# Patient Record
Sex: Female | Born: 1955 | Race: White | Hispanic: No | Marital: Married | Smoking: Former smoker
Health system: Southern US, Community
[De-identification: ages and names within clinical notes are randomized; demographics above are authoritative.]

## PROBLEM LIST (undated history)

## (undated) DIAGNOSIS — Z923 Personal history of irradiation: Secondary | ICD-10-CM

## (undated) DIAGNOSIS — K219 Gastro-esophageal reflux disease without esophagitis: Secondary | ICD-10-CM

## (undated) DIAGNOSIS — E785 Hyperlipidemia, unspecified: Secondary | ICD-10-CM

## (undated) HISTORY — DX: Personal history of irradiation: Z92.3

---

## 1997-09-07 HISTORY — PX: ABDOMINAL HYSTERECTOMY: SHX81

## 1997-10-17 ENCOUNTER — Ambulatory Visit (HOSPITAL_COMMUNITY): Admission: RE | Admit: 1997-10-17 | Discharge: 1997-10-17 | Payer: Self-pay | Admitting: Obstetrics and Gynecology

## 1998-04-19 ENCOUNTER — Inpatient Hospital Stay (HOSPITAL_COMMUNITY): Admission: EM | Admit: 1998-04-19 | Discharge: 1998-04-22 | Payer: Self-pay | Admitting: Emergency Medicine

## 1999-10-28 ENCOUNTER — Other Ambulatory Visit: Admission: RE | Admit: 1999-10-28 | Discharge: 1999-10-28 | Payer: Self-pay | Admitting: Obstetrics and Gynecology

## 2001-02-02 ENCOUNTER — Other Ambulatory Visit: Admission: RE | Admit: 2001-02-02 | Discharge: 2001-02-02 | Payer: Self-pay | Admitting: Obstetrics and Gynecology

## 2002-05-03 ENCOUNTER — Other Ambulatory Visit: Admission: RE | Admit: 2002-05-03 | Discharge: 2002-05-03 | Payer: Self-pay | Admitting: Obstetrics and Gynecology

## 2003-09-11 ENCOUNTER — Other Ambulatory Visit: Admission: RE | Admit: 2003-09-11 | Discharge: 2003-09-11 | Payer: Self-pay | Admitting: Obstetrics and Gynecology

## 2004-10-28 ENCOUNTER — Other Ambulatory Visit: Admission: RE | Admit: 2004-10-28 | Discharge: 2004-10-28 | Payer: Self-pay | Admitting: Obstetrics and Gynecology

## 2005-12-25 ENCOUNTER — Other Ambulatory Visit: Admission: RE | Admit: 2005-12-25 | Discharge: 2005-12-25 | Payer: Self-pay | Admitting: Obstetrics and Gynecology

## 2007-03-09 ENCOUNTER — Other Ambulatory Visit: Admission: RE | Admit: 2007-03-09 | Discharge: 2007-03-09 | Payer: Self-pay | Admitting: Obstetrics and Gynecology

## 2008-06-05 ENCOUNTER — Ambulatory Visit: Payer: Self-pay | Admitting: Obstetrics and Gynecology

## 2008-06-05 ENCOUNTER — Encounter: Payer: Self-pay | Admitting: Obstetrics and Gynecology

## 2008-06-05 ENCOUNTER — Other Ambulatory Visit: Admission: RE | Admit: 2008-06-05 | Discharge: 2008-06-05 | Payer: Self-pay | Admitting: Obstetrics and Gynecology

## 2008-07-02 ENCOUNTER — Ambulatory Visit: Payer: Self-pay | Admitting: Obstetrics and Gynecology

## 2019-08-29 DIAGNOSIS — Z20828 Contact with and (suspected) exposure to other viral communicable diseases: Secondary | ICD-10-CM | POA: Diagnosis not present

## 2019-08-29 DIAGNOSIS — Z9189 Other specified personal risk factors, not elsewhere classified: Secondary | ICD-10-CM | POA: Diagnosis not present

## 2019-09-06 DIAGNOSIS — Z20828 Contact with and (suspected) exposure to other viral communicable diseases: Secondary | ICD-10-CM | POA: Diagnosis not present

## 2020-01-02 ENCOUNTER — Other Ambulatory Visit: Payer: Self-pay

## 2020-01-02 ENCOUNTER — Ambulatory Visit (HOSPITAL_COMMUNITY)
Admission: EM | Admit: 2020-01-02 | Discharge: 2020-01-02 | Disposition: A | Discharge status: Home / Self Care | Payer: BC Managed Care – PPO | Attending: Family Medicine | Admitting: Family Medicine

## 2020-01-02 DIAGNOSIS — S0096XA Insect bite (nonvenomous) of unspecified part of head, initial encounter: Secondary | ICD-10-CM

## 2020-01-02 DIAGNOSIS — R220 Localized swelling, mass and lump, head: Secondary | ICD-10-CM | POA: Diagnosis not present

## 2020-01-02 DIAGNOSIS — W57XXXA Bitten or stung by nonvenomous insect and other nonvenomous arthropods, initial encounter: Secondary | ICD-10-CM | POA: Diagnosis not present

## 2020-01-02 DIAGNOSIS — S0086XA Insect bite (nonvenomous) of other part of head, initial encounter: Secondary | ICD-10-CM

## 2020-01-02 NOTE — ED Provider Notes (Signed)
[place]    CSN: [id number] Arrival date & time: 01/02/20  [id number]      History   Chief Complaint Chief Complaint  Patient presents with  . Rash    HPI Regina Young is a 64 y.o. female.   HPI  Patient has a swollen area on the left side of her jaw It is itching and stinging but it is not painful she has no dental pain, gum swelling, or pain with chewing She did have an extraction on the right side of her face over a week ago took penicillin and did have some dental infection.  This feels nothing similar  she does spend a lot of time outdoors.  An insect bite is possible  She states that the swollen area is slightly pink, but there is no raised rash, blisters  No past medical history on file.  There are no problems to display for this patient.   OB History   No obstetric history on file.      Home Medications    Prior to Admission medications   Not on File    Family History No family history on file.  Social History Social History   Tobacco Use  . Smoking status: Not on file  Substance Use Topics  . Alcohol use: Not on file  . Drug use: Not on file     Allergies   Patient has no known allergies.   Review of Systems Review of Systems   Physical Exam Triage Vital Signs ED Triage Vitals [01/02/20 0949]  Enc Vitals Group     BP (!) 166/89     Pulse Rate 84     Resp 16     Temp 98.3 F (36.8 C)     Temp src      SpO2 97 %     Weight      Height      Head Circumference      Peak Flow      Pain Score 0     Pain Loc      Pain Edu?      Excl. in [place]?    No data found.  Updated Vital Signs BP (!) 166/89   Pulse 84   Temp 98.3 F (36.8 C)   Resp 16   SpO2 97%     Physical Exam Constitutional:      General: She is not in acute distress.    Appearance: Normal appearance. She is well-developed and normal weight.  HENT:     Head: Normocephalic and atraumatic.     Jaw: There is normal jaw occlusion.     Salivary Glands:  Right salivary gland is not diffusely enlarged or tender. Left salivary gland is not diffusely enlarged or tender.   Eyes:     Conjunctiva/sclera: Conjunctivae normal.     Pupils: Pupils are equal, round, and reactive to light.  Cardiovascular:     Rate and Rhythm: Normal rate.  Pulmonary:     Effort: Pulmonary effort is normal. No respiratory distress.  Abdominal:     General: There is no distension.     Palpations: Abdomen is soft.  Musculoskeletal:        General: Normal range of motion.     Cervical back: Normal range of motion.  Skin:    General: Skin is warm and dry.  Neurological:     Mental Status: She is alert.      UC Treatments / Results  Labs (all  labs ordered are listed, but only abnormal results are displayed) Labs Reviewed - No data to display  EKG   Radiology No results found.  Procedures Procedures (including critical care time)  Medications Ordered in UC Medications - No data to display  Initial Impression / Assessment and Plan / UC Course  I have reviewed the triage vital signs and the nursing notes.  Pertinent labs & imaging results that were available during my care of the patient were reviewed by me and considered in my medical decision making (see chart for details).     Discussed with patient that blood pressure is mildly elevated.  She states this is typical for her when she goes to the doctor. I think this is likely an insect bite.  No evidence of infection.  Discussed conservative treatment Final Clinical Impressions(s) / UC Diagnoses   Final diagnoses:  Insect bite of other part of head, initial encounter  Swelling of left side of face     Discharge Instructions     Continue ice and cortisone cream Take an allergy pill for the itching Return as needed   ED Prescriptions    None     PDMP not reviewed this encounter.   Raylene Everts, MD 01/02/20 1056

## 2020-01-02 NOTE — Discharge Instructions (Addendum)
Continue ice and cortisone cream Take an allergy pill for the itching Return as needed

## 2020-01-02 NOTE — ED Triage Notes (Signed)
Pt c/o rash to L jaw since yesterday. Denies pain.

## 2020-10-25 ENCOUNTER — Other Ambulatory Visit: Payer: Self-pay | Admitting: Geriatric Medicine

## 2020-10-25 DIAGNOSIS — R1084 Generalized abdominal pain: Secondary | ICD-10-CM | POA: Diagnosis not present

## 2020-10-25 DIAGNOSIS — Z13220 Encounter for screening for lipoid disorders: Secondary | ICD-10-CM | POA: Diagnosis not present

## 2020-11-06 ENCOUNTER — Other Ambulatory Visit: Payer: BC Managed Care – PPO

## 2020-11-06 DIAGNOSIS — R1013 Epigastric pain: Secondary | ICD-10-CM | POA: Diagnosis not present

## 2020-11-12 ENCOUNTER — Other Ambulatory Visit: Payer: Self-pay | Admitting: Geriatric Medicine

## 2020-11-12 ENCOUNTER — Ambulatory Visit
Admission: RE | Admit: 2020-11-12 | Discharge: 2020-11-12 | Disposition: A | Discharge status: Home / Self Care | Payer: BC Managed Care – PPO | Source: Ambulatory Visit | Attending: Geriatric Medicine | Admitting: Geriatric Medicine

## 2020-11-12 DIAGNOSIS — F17210 Nicotine dependence, cigarettes, uncomplicated: Secondary | ICD-10-CM

## 2020-11-12 DIAGNOSIS — J439 Emphysema, unspecified: Secondary | ICD-10-CM | POA: Diagnosis not present

## 2020-12-03 ENCOUNTER — Other Ambulatory Visit: Payer: Self-pay | Admitting: Physician Assistant

## 2020-12-03 DIAGNOSIS — Z800 Family history of malignant neoplasm of digestive organs: Secondary | ICD-10-CM | POA: Diagnosis not present

## 2020-12-03 DIAGNOSIS — R194 Change in bowel habit: Secondary | ICD-10-CM | POA: Diagnosis not present

## 2020-12-03 DIAGNOSIS — R634 Abnormal weight loss: Secondary | ICD-10-CM | POA: Diagnosis not present

## 2020-12-03 DIAGNOSIS — R140 Abdominal distension (gaseous): Secondary | ICD-10-CM | POA: Diagnosis not present

## 2020-12-05 DIAGNOSIS — R140 Abdominal distension (gaseous): Secondary | ICD-10-CM | POA: Diagnosis not present

## 2020-12-12 DIAGNOSIS — D127 Benign neoplasm of rectosigmoid junction: Secondary | ICD-10-CM | POA: Diagnosis not present

## 2020-12-12 DIAGNOSIS — Z1211 Encounter for screening for malignant neoplasm of colon: Secondary | ICD-10-CM | POA: Diagnosis not present

## 2020-12-12 DIAGNOSIS — R140 Abdominal distension (gaseous): Secondary | ICD-10-CM | POA: Diagnosis not present

## 2020-12-12 DIAGNOSIS — K2930 Chronic superficial gastritis without bleeding: Secondary | ICD-10-CM | POA: Diagnosis not present

## 2020-12-12 DIAGNOSIS — D124 Benign neoplasm of descending colon: Secondary | ICD-10-CM | POA: Diagnosis not present

## 2020-12-12 DIAGNOSIS — K2980 Duodenitis without bleeding: Secondary | ICD-10-CM | POA: Diagnosis not present

## 2020-12-12 DIAGNOSIS — R634 Abnormal weight loss: Secondary | ICD-10-CM | POA: Diagnosis not present

## 2020-12-12 DIAGNOSIS — D123 Benign neoplasm of transverse colon: Secondary | ICD-10-CM | POA: Diagnosis not present

## 2020-12-12 DIAGNOSIS — K219 Gastro-esophageal reflux disease without esophagitis: Secondary | ICD-10-CM | POA: Diagnosis not present

## 2020-12-12 DIAGNOSIS — K5730 Diverticulosis of large intestine without perforation or abscess without bleeding: Secondary | ICD-10-CM | POA: Diagnosis not present

## 2020-12-12 DIAGNOSIS — D122 Benign neoplasm of ascending colon: Secondary | ICD-10-CM | POA: Diagnosis not present

## 2020-12-12 DIAGNOSIS — R6881 Early satiety: Secondary | ICD-10-CM | POA: Diagnosis not present

## 2020-12-16 DIAGNOSIS — C189 Malignant neoplasm of colon, unspecified: Secondary | ICD-10-CM | POA: Diagnosis not present

## 2020-12-18 ENCOUNTER — Ambulatory Visit
Admission: RE | Admit: 2020-12-18 | Discharge: 2020-12-18 | Disposition: A | Discharge status: Home / Self Care | Payer: BC Managed Care – PPO | Source: Ambulatory Visit | Attending: Physician Assistant | Admitting: Physician Assistant

## 2020-12-18 ENCOUNTER — Other Ambulatory Visit: Payer: Self-pay

## 2020-12-18 DIAGNOSIS — D734 Cyst of spleen: Secondary | ICD-10-CM | POA: Diagnosis not present

## 2020-12-18 DIAGNOSIS — Z800 Family history of malignant neoplasm of digestive organs: Secondary | ICD-10-CM

## 2020-12-18 DIAGNOSIS — R634 Abnormal weight loss: Secondary | ICD-10-CM

## 2020-12-18 DIAGNOSIS — K7689 Other specified diseases of liver: Secondary | ICD-10-CM | POA: Diagnosis not present

## 2020-12-18 DIAGNOSIS — C189 Malignant neoplasm of colon, unspecified: Secondary | ICD-10-CM | POA: Diagnosis not present

## 2020-12-18 MED ORDER — IOPAMIDOL (ISOVUE-300) INJECTION 61%
100.0000 mL | Freq: Once | INTRAVENOUS | Status: AC | PRN
  Administered 2020-12-18: 100 mL via INTRAVENOUS

## 2020-12-23 DIAGNOSIS — K6289 Other specified diseases of anus and rectum: Secondary | ICD-10-CM | POA: Diagnosis not present

## 2020-12-24 ENCOUNTER — Ambulatory Visit: Payer: Self-pay | Admitting: General Surgery

## 2020-12-24 NOTE — H&P (Signed)
The patient is a 65 year old female who presents with a colonic mass. 65 year old female who presents to the office for evaluation of a rectosigmoid mass seen on colonoscopy. This was biopsied and tattooed. Biopsy showed tubulovillous adenoma only. Patient states that she has had difficulty with bloating and abdominal pain for the past few months. She has approximately 15 pound unintentional weight loss. She reports irregular bowel habits. She has switched eating smaller meals to help with this. She underwent a CT scan of the abdomen and pelvis. This showed no sign of a colonic tumor definitively. There was also no sign of metastatic disease. She states her CEA level was normal. Past surgical history significant for open hysterectomy in the 90s   Past Surgical History Mammie Lorenzo, LPN; 03/06/5283 1:32 PM) Colon Polyp Removal - Colonoscopy  Hysterectomy (not due to cancer) - Complete   Diagnostic Studies History Mammie Lorenzo, LPN; 4/40/1027 2:53 PM) Colonoscopy  within last year Mammogram  >3 years ago Pap Smear  >5 years ago  Allergies Mammie Lorenzo, LPN; 6/64/4034 7:42 PM) No Known Drug Allergies  [12/23/2020]: Allergies Reconciled   Medication History Mammie Lorenzo, LPN; 5/95/6387 5:64 PM) Rosuvastatin Calcium (5MG  Tablet, Oral) Active. Medications Reconciled  Social History Mammie Lorenzo, LPN; 3/32/9518 8:41 PM) Alcohol use  Occasional alcohol use. Caffeine use  Coffee, Tea. No drug use  Tobacco use  Current every day smoker.  Family History Mammie Lorenzo, LPN; 6/60/6301 6:01 PM) Cancer  Mother. Colon Polyps  Sister. Diabetes Mellitus  Sister. Heart Disease  Father. Hypertension  Sister. Melanoma  Father.  Pregnancy / Birth History Mammie Lorenzo, LPN; 0/93/2355 7:32 PM) Contraceptive History  Oral contraceptives. Gravida  0 Para  0  Other Problems Mammie Lorenzo, LPN; 10/09/5425 0:62 PM) Hypercholesterolemia  Oophorectomy      Review of Systems Claiborne Billings [facility] LPN; 3/76/2831 5:17 PM) General Present- Weight Loss. Not Present- Appetite Loss, Chills, Fatigue, Fever, Night Sweats and Weight Gain. Skin Not Present- Change in Wart/Mole, Dryness, Hives, Jaundice, New Lesions, Non-Healing Wounds, Rash and Ulcer. HEENT Present- Wears glasses/contact lenses. Not Present- Earache, Hearing Loss, Hoarseness, Nose Bleed, Oral Ulcers, Ringing in the Ears, Seasonal Allergies, Sinus Pain, Sore Throat, Visual Disturbances and Yellow Eyes. Respiratory Not Present- Bloody sputum, Chronic Cough, Difficulty Breathing, Snoring and Wheezing. Breast Not Present- Breast Mass, Breast Pain, Nipple Discharge and Skin Changes. Cardiovascular Not Present- Chest Pain, Difficulty Breathing Lying Down, Leg Cramps, Palpitations, Rapid Heart Rate, Shortness of Breath and Swelling of Extremities. Gastrointestinal Present- Bloating, Change in Bowel Habits and Excessive gas. Not Present- Abdominal Pain, Bloody Stool, Chronic diarrhea, Constipation, Difficulty Swallowing, Gets full quickly at meals, Hemorrhoids, Indigestion, Nausea, Rectal Pain and Vomiting. Female Genitourinary Not Present- Frequency, Nocturia, Painful Urination, Pelvic Pain and Urgency. Musculoskeletal Not Present- Back Pain, Joint Pain, Joint Stiffness, Muscle Pain, Muscle Weakness and Swelling of Extremities. Psychiatric Not Present- Anxiety, Bipolar, Change in Sleep Pattern, Depression, Fearful and Frequent crying. Endocrine Not Present- Cold Intolerance, Excessive Hunger, Hair Changes, Heat Intolerance, Hot flashes and New Diabetes. Hematology Not Present- Blood Thinners, Easy Bruising, Excessive bleeding, Gland problems, HIV and Persistent Infections.  Vitals Claiborne Billings [place] LPN; 02/21/736 1:06 PM) 12/23/2020 2:01 PM Weight: 96.6 lb Height: 62in Body Surface Area: 1.4 m Body Mass Index: 17.67 kg/m  Pulse: 98 (Regular)  BP: 118/72(Sitting, Left Arm,  Standard)       Physical Exam Leighton Ruff MD; 2/69/4854 2:32 PM) General Mental Status-Alert. General Appearance-Cooperative.  Abdomen Palpation/Percussion Palpation and Percussion of the abdomen reveal -  Soft.    Assessment & Plan Leighton Ruff MD; 1/36/4383 2:33 PM) RECTAL MASS (K62.89) Impression: 65 year old female who presents to the office for evaluation of a rectal mass seen on recent colonoscopy. Biopsies only showed tubulovillous adenoma. There is no definitive localization on CT scan, but there appears to be some thickening in the mid rectum.   I will proceed with a flexible sigmoidoscopy to assess the tumor location myself and get more biopsies.  If her biopsies returned positive for adenocarcinoma, she will need a CT scan of her chest to complete her metastatic workup and possibly an MRI depending on the location of the tumor.

## 2020-12-24 NOTE — H&P (View-Only) (Signed)
The patient is a 65 year old female who presents with a colonic mass. 65 year old female who presents to the office for evaluation of a rectosigmoid mass seen on colonoscopy. This was biopsied and tattooed. Biopsy showed tubulovillous adenoma only. Patient states that she has had difficulty with bloating and abdominal pain for the past few months. She has approximately 15 pound unintentional weight loss. She reports irregular bowel habits. She has switched eating smaller meals to help with this. She underwent a CT scan of the abdomen and pelvis. This showed no sign of a colonic tumor definitively. There was also no sign of metastatic disease. She states her CEA level was normal. Past surgical history significant for open hysterectomy in the 90s   Past Surgical History Mammie Lorenzo, LPN; 12/04/5186 4:16 PM) Colon Polyp Removal - Colonoscopy  Hysterectomy (not due to cancer) - Complete   Diagnostic Studies History Mammie Lorenzo, LPN; 02/11/3015 0:10 PM) Colonoscopy  within last year Mammogram  >3 years ago Pap Smear  >5 years ago  Allergies Mammie Lorenzo, LPN; 9/32/3557 3:22 PM) No Known Drug Allergies  [12/23/2020]: Allergies Reconciled   Medication History Mammie Lorenzo, LPN; 0/25/4270 6:23 PM) Rosuvastatin Calcium (5MG  Tablet, Oral) Active. Medications Reconciled  Social History Mammie Lorenzo, LPN; 7/62/8315 1:76 PM) Alcohol use  Occasional alcohol use. Caffeine use  Coffee, Tea. No drug use  Tobacco use  Current every day smoker.  Family History Mammie Lorenzo, LPN; 1/60/7371 0:62 PM) Cancer  Mother. Colon Polyps  Sister. Diabetes Mellitus  Sister. Heart Disease  Father. Hypertension  Sister. Melanoma  Father.  Pregnancy / Birth History Mammie Lorenzo, LPN; 6/94/8546 2:70 PM) Contraceptive History  Oral contraceptives. Gravida  0 Para  0  Other Problems Mammie Lorenzo, LPN; 3/50/0938 1:82 PM) Hypercholesterolemia  Oophorectomy      Review of Systems Claiborne Billings [facility] LPN; 9/93/7169 6:78 PM) General Present- Weight Loss. Not Present- Appetite Loss, Chills, Fatigue, Fever, Night Sweats and Weight Gain. Skin Not Present- Change in Wart/Mole, Dryness, Hives, Jaundice, New Lesions, Non-Healing Wounds, Rash and Ulcer. HEENT Present- Wears glasses/contact lenses. Not Present- Earache, Hearing Loss, Hoarseness, Nose Bleed, Oral Ulcers, Ringing in the Ears, Seasonal Allergies, Sinus Pain, Sore Throat, Visual Disturbances and Yellow Eyes. Respiratory Not Present- Bloody sputum, Chronic Cough, Difficulty Breathing, Snoring and Wheezing. Breast Not Present- Breast Mass, Breast Pain, Nipple Discharge and Skin Changes. Cardiovascular Not Present- Chest Pain, Difficulty Breathing Lying Down, Leg Cramps, Palpitations, Rapid Heart Rate, Shortness of Breath and Swelling of Extremities. Gastrointestinal Present- Bloating, Change in Bowel Habits and Excessive gas. Not Present- Abdominal Pain, Bloody Stool, Chronic diarrhea, Constipation, Difficulty Swallowing, Gets full quickly at meals, Hemorrhoids, Indigestion, Nausea, Rectal Pain and Vomiting. Female Genitourinary Not Present- Frequency, Nocturia, Painful Urination, Pelvic Pain and Urgency. Musculoskeletal Not Present- Back Pain, Joint Pain, Joint Stiffness, Muscle Pain, Muscle Weakness and Swelling of Extremities. Psychiatric Not Present- Anxiety, Bipolar, Change in Sleep Pattern, Depression, Fearful and Frequent crying. Endocrine Not Present- Cold Intolerance, Excessive Hunger, Hair Changes, Heat Intolerance, Hot flashes and New Diabetes. Hematology Not Present- Blood Thinners, Easy Bruising, Excessive bleeding, Gland problems, HIV and Persistent Infections.  Vitals Claiborne Billings [place] LPN; 9/38/1017 5:10 PM) 12/23/2020 2:01 PM Weight: 96.6 lb Height: 62in Body Surface Area: 1.4 m Body Mass Index: 17.67 kg/m  Pulse: 98 (Regular)  BP: 118/72(Sitting, Left Arm,  Standard)       Physical Exam Leighton Ruff MD; 2/58/5277 2:32 PM) General Mental Status-Alert. General Appearance-Cooperative.  Abdomen Palpation/Percussion Palpation and Percussion of the abdomen reveal -  Soft.    Assessment & Plan Leighton Ruff MD; 3/73/4287 2:33 PM) RECTAL MASS (K62.89) Impression: 65 year old female who presents to the office for evaluation of a rectal mass seen on recent colonoscopy. Biopsies only showed tubulovillous adenoma. There is no definitive localization on CT scan, but there appears to be some thickening in the mid rectum.   I will proceed with a flexible sigmoidoscopy to assess the tumor location myself and get more biopsies.  If her biopsies returned positive for adenocarcinoma, she will need a CT scan of her chest to complete her metastatic workup and possibly an MRI depending on the location of the tumor.

## 2020-12-26 ENCOUNTER — Other Ambulatory Visit: Payer: Self-pay

## 2020-12-26 ENCOUNTER — Encounter (HOSPITAL_COMMUNITY): Payer: Self-pay | Admitting: General Surgery

## 2020-12-30 ENCOUNTER — Other Ambulatory Visit (HOSPITAL_COMMUNITY)
Admission: RE | Admit: 2020-12-30 | Discharge: 2020-12-30 | Disposition: A | Discharge status: Home / Self Care | Payer: BC Managed Care – PPO | Source: Ambulatory Visit | Attending: General Surgery | Admitting: General Surgery

## 2020-12-30 DIAGNOSIS — E7800 Pure hypercholesterolemia, unspecified: Secondary | ICD-10-CM | POA: Diagnosis not present

## 2020-12-30 DIAGNOSIS — Z808 Family history of malignant neoplasm of other organs or systems: Secondary | ICD-10-CM | POA: Diagnosis not present

## 2020-12-30 DIAGNOSIS — F17200 Nicotine dependence, unspecified, uncomplicated: Secondary | ICD-10-CM | POA: Diagnosis not present

## 2020-12-30 DIAGNOSIS — D128 Benign neoplasm of rectum: Secondary | ICD-10-CM | POA: Diagnosis not present

## 2020-12-30 DIAGNOSIS — Z01812 Encounter for preprocedural laboratory examination: Secondary | ICD-10-CM | POA: Insufficient documentation

## 2020-12-30 DIAGNOSIS — Z01818 Encounter for other preprocedural examination: Secondary | ICD-10-CM | POA: Diagnosis not present

## 2020-12-30 DIAGNOSIS — Z809 Family history of malignant neoplasm, unspecified: Secondary | ICD-10-CM | POA: Diagnosis not present

## 2020-12-30 DIAGNOSIS — Z90710 Acquired absence of both cervix and uterus: Secondary | ICD-10-CM | POA: Diagnosis not present

## 2020-12-30 DIAGNOSIS — Z20822 Contact with and (suspected) exposure to covid-19: Secondary | ICD-10-CM | POA: Insufficient documentation

## 2020-12-30 DIAGNOSIS — Z8371 Family history of colonic polyps: Secondary | ICD-10-CM | POA: Diagnosis not present

## 2020-12-31 LAB — SARS CORONAVIRUS 2 (TAT 6-24 HRS): SARS Coronavirus 2: NEGATIVE

## 2021-01-01 ENCOUNTER — Other Ambulatory Visit: Payer: Self-pay

## 2021-01-01 ENCOUNTER — Encounter (HOSPITAL_COMMUNITY): Payer: Self-pay | Admitting: General Surgery

## 2021-01-01 ENCOUNTER — Encounter (HOSPITAL_COMMUNITY)
Admission: RE | Disposition: A | Discharge status: Home / Self Care | Payer: Self-pay | Source: Home / Self Care | Attending: General Surgery

## 2021-01-01 ENCOUNTER — Ambulatory Visit (HOSPITAL_COMMUNITY)
Admission: RE | Admit: 2021-01-01 | Discharge: 2021-01-01 | Disposition: A | Discharge status: Home / Self Care | Payer: BC Managed Care – PPO | Attending: General Surgery | Admitting: General Surgery

## 2021-01-01 DIAGNOSIS — Z90710 Acquired absence of both cervix and uterus: Secondary | ICD-10-CM | POA: Insufficient documentation

## 2021-01-01 DIAGNOSIS — Z20822 Contact with and (suspected) exposure to covid-19: Secondary | ICD-10-CM | POA: Diagnosis not present

## 2021-01-01 DIAGNOSIS — Z808 Family history of malignant neoplasm of other organs or systems: Secondary | ICD-10-CM | POA: Diagnosis not present

## 2021-01-01 DIAGNOSIS — Z8371 Family history of colonic polyps: Secondary | ICD-10-CM | POA: Insufficient documentation

## 2021-01-01 DIAGNOSIS — Z01818 Encounter for other preprocedural examination: Secondary | ICD-10-CM | POA: Insufficient documentation

## 2021-01-01 DIAGNOSIS — E7800 Pure hypercholesterolemia, unspecified: Secondary | ICD-10-CM | POA: Diagnosis not present

## 2021-01-01 DIAGNOSIS — F17200 Nicotine dependence, unspecified, uncomplicated: Secondary | ICD-10-CM | POA: Diagnosis not present

## 2021-01-01 DIAGNOSIS — D375 Neoplasm of uncertain behavior of rectum: Secondary | ICD-10-CM | POA: Diagnosis not present

## 2021-01-01 DIAGNOSIS — D128 Benign neoplasm of rectum: Secondary | ICD-10-CM | POA: Diagnosis not present

## 2021-01-01 DIAGNOSIS — Z809 Family history of malignant neoplasm, unspecified: Secondary | ICD-10-CM | POA: Diagnosis not present

## 2021-01-01 HISTORY — DX: Hyperlipidemia, unspecified: E78.5

## 2021-01-01 HISTORY — PX: FLEXIBLE SIGMOIDOSCOPY: SHX5431

## 2021-01-01 HISTORY — PX: BIOPSY: SHX5522

## 2021-01-01 SURGERY — SIGMOIDOSCOPY, FLEXIBLE

## 2021-01-01 MED ORDER — LACTATED RINGERS IV SOLN: INTRAVENOUS | Status: DC

## 2021-01-01 MED ORDER — FENTANYL CITRATE (PF) 100 MCG/2ML IJ SOLN
INTRAMUSCULAR | Status: AC
  Filled 2021-01-01: qty 4

## 2021-01-01 MED ORDER — SODIUM CHLORIDE 0.9% FLUSH: 3.0000 mL | Freq: Two times a day (BID) | INTRAVENOUS | Status: DC

## 2021-01-01 MED ORDER — FENTANYL CITRATE (PF) 100 MCG/2ML IJ SOLN
INTRAMUSCULAR | Status: DC | PRN
  Administered 2021-01-01: 25 ug via INTRAVENOUS

## 2021-01-01 MED ORDER — MIDAZOLAM HCL (PF) 5 MG/ML IJ SOLN
INTRAMUSCULAR | Status: AC
  Filled 2021-01-01: qty 1

## 2021-01-01 MED ORDER — MIDAZOLAM HCL (PF) 5 MG/ML IJ SOLN
INTRAMUSCULAR | Status: DC | PRN
  Administered 2021-01-01: 2 mg via INTRAVENOUS

## 2021-01-01 NOTE — Discharge Instructions (Signed)
Post Colonoscopy Instructions ° °1. DIET: Follow a light bland diet the first 24 hours after arrival home, such as soup, liquids, crackers, etc.  Be sure to include lots of fluids daily.  Avoid fast food or heavy meals as your are more likely to get nauseated.   °2. You may have some mild rectal bleeding for the first few days after the procedure.  This should get less and less with time.  Resume any blood thinners 2 days after your procedure unless directed otherwise by your physician. °3. Take your usually prescribed home medications unless otherwise directed. °a. If you have any pain, it is helpful to get up and walk around, as it is usually from excess gas. °b. If this is not helpful, you can take an over-the-counter pain medication.  Choose one of the following that works best for you: °i. Naproxen (Aleve, etc)  Two 220mg tabs twice a day °ii. Ibuprofen (Advil, etc) Three 200mg tabs four times a day (every meal & bedtime) °iii. If you still have pain after using one of these, please call the office °4. It is normal to not have a bowel movement for 2-3 days after colonoscopy.   ° °5. ACTIVITIES as tolerated:   °6. You may resume regular (light) daily activities beginning the next day--such as daily self-care, walking, climbing stairs--gradually increasing activities as tolerated.  ° ° °WHEN TO CALL US [phone]: °1. Fever over 101.5 F (38.5 C)  °2. Severe abdominal or chest pain  °3. Large amount of rectal bleeding, passing multiple blood clots  °4. Dizziness or shortness of breath °5. Increasing nausea or vomiting ° ° The clinic staff is available to answer your questions during regular business hours (8:30am-5pm).  Please don’t hesitate to call and ask to speak to one of our nurses for clinical concerns.  ° If you have a medical emergency, go to the nearest emergency room or call 911. ° A surgeon from [facility] is always on call at the hospitals ° ° °[facility] °[address], [address], [place], [place]  [address] ? °MAIN: [phone] ? TOLL FREE: [phone] ?  °FAX [phone] °[email or web address] ° ° °

## 2021-01-01 NOTE — Interval H&P Note (Signed)
History and Physical Interval Note:  01/01/2021 4:26 PM  Regina Young  has presented today for surgery, with the diagnosis of colon cancer.  The various methods of treatment have been discussed with the patient and family. After consideration of risks, benefits and other options for treatment, the patient has consented to  Procedure(s) with comments: [place] (N/A) - IV SEDATION BY SURGEON as a surgical intervention.  The patient's history has been reviewed, patient examined, no change in status, stable for surgery.  I have reviewed the patient's chart and labs.  Questions were answered to the patient's satisfaction.     Rosario Adie, MD  [facility]

## 2021-01-01 NOTE — Op Note (Signed)
[facility] Patient Name: Regina Young Procedure Date: 01/01/2021 MRN: [id number] Attending MD: Leighton Ruff , MD Date of Birth: March 10, 1956 CSN: [id number] Age: 65 Admit Type: Outpatient Procedure:                Flexible Sigmoidoscopy Indications:              Preoperative assessment Providers:                Leighton Ruff, MD, Kary Kos RN, RN, Ladona Ridgel, Technician, Dulcy Fanny , RN Referring MD:              Medicines:                Fentanyl 25 micrograms IV, Midazolam 2 mg IV Complications:            No immediate complications. Estimated blood loss:                            Minimal. Estimated Blood Loss:     Estimated blood loss was minimal. Procedure:                Pre-Anesthesia Assessment:                           - Prior to the procedure, a History and Physical                            was performed, and patient medications and                            allergies were reviewed. The patient's tolerance of                            previous anesthesia was also reviewed. The risks                            and benefits of the procedure and the sedation                            options and risks were discussed with the patient.                            All questions were answered, and informed consent                            was obtained. Prior Anticoagulants: The patient has                            taken no previous anticoagulant or antiplatelet                            agents. ASA Grade Assessment: II - A patient with  mild systemic disease. After reviewing the risks                            and benefits, the patient was deemed in                            satisfactory condition to undergo the procedure.                           After obtaining informed consent, the scope was                            passed under direct vision. The [id number]-[id number] ([id number])                             Olympus gastroscope was introduced through the anus                            and advanced to the the sigmoid colon. The flexible                            sigmoidoscopy was accomplished without difficulty.                            The patient tolerated the procedure well. The                            quality of the bowel preparation was excellent. Scope In: 4:50:41 PM Scope Out: 4:58:36 PM Total Procedure Duration: 0 hours 7 minutes 55 seconds  Findings:      The perianal and digital rectal examinations were normal.      A tattoo was seen in the proximal rectum.      A polypoid lesion was found in the recto-sigmoid colon. The lesion was       frond-like/villous. No bleeding was present. Biopsies were taken with a       cold forceps for histology. Impression:               - No specimens collected. Moderate Sedation:      Moderate (conscious) sedation was administered by the endoscopy nurse       and supervised by the endoscopist. The following parameters were       monitored: oxygen saturation, heart rate, blood pressure, and response       to care. Recommendation:           - Discharge patient to home (ambulatory).                           - Resume previous diet.                           - Return to my office in 2 weeks. Procedure Code(s):        --- Professional ---                           [phone], Sigmoidoscopy, flexible; with biopsy, single  or multiple Diagnosis Code(s):        --- Professional ---                           I62.703, Encounter for other preprocedural                            examination CPT copyright 2019 [employer]. All rights reserved. The codes documented in this report are preliminary and upon coder review may  be revised to meet current compliance requirements. Leighton Ruff, MD Leighton Ruff, MD 5/00/9381 5:12:58 PM This report has been signed electronically. Number of Addenda: 0

## 2021-01-02 ENCOUNTER — Encounter (HOSPITAL_COMMUNITY): Payer: Self-pay | Admitting: General Surgery

## 2021-01-03 LAB — SURGICAL PATHOLOGY

## 2021-01-14 ENCOUNTER — Ambulatory Visit: Payer: Self-pay | Admitting: General Surgery

## 2021-01-14 DIAGNOSIS — K6289 Other specified diseases of anus and rectum: Secondary | ICD-10-CM | POA: Diagnosis not present

## 2021-01-14 NOTE — H&P (Signed)
The patient is a 65 year old female who presents with a colonic mass. 65 year old female who presents to the office for evaluation of a rectosigmoid mass seen on colonoscopy. This was biopsied and tattooed. Biopsy showed tubulovillous adenoma only. Patient states that she has had difficulty with bloating and abdominal pain for the past few months. She has approximately 15 pound unintentional weight loss. She reports irregular bowel habits. She has switched eating smaller meals to help with this. She underwent a CT scan of the abdomen and pelvis. This showed no sign of a colonic tumor definitively. There was also no sign of metastatic disease. She states her CEA level was normal. Past surgical history significant for open hysterectomy in the 90s. I recently repeated her flexible sigmoidoscopy and noted a mass in the rectosigmoid area approximately 12 cm from anal verge. This does not appear to be endoscopically resectable. 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   Problem List/Past Medical Leighton Ruff, MD; 7/82/9562 10:09 AM) RECTAL MASS (K62.89)  Past Surgical History Leighton Ruff, MD; 10/07/8655 10:09 AM) Colon Polyp Removal - Colonoscopy Hysterectomy (not due to cancer) - Complete  Diagnostic Studies History Leighton Ruff, MD; 8/46/9629 10:09 AM) Colonoscopy within last year Mammogram >3 years ago Pap Smear >5 years ago  Allergies Janeann Forehand, CNA; 01/14/2021 9:46 AM) No Known Drug Allergies [12/23/2020]: Allergies Reconciled  Medication History  Bisacodyl EC (5MG  Tablet DR, Oral) Active. Rosuvastatin Calcium (5MG  Tablet, Oral) Active.  Social History Leighton Ruff, MD; 02/02/4131 10:09 AM) Alcohol use Occasional alcohol use. Caffeine use Coffee, Tea. No drug use Tobacco use Current every day smoker.  Family History Leighton Ruff, MD; 4/40/1027 10:09 AM) Cancer Mother. Colon Polyps Sister. Diabetes Mellitus  Sister. Heart Disease Father. Hypertension Sister. Melanoma Father.  Pregnancy / Birth History Leighton Ruff, MD; 2/53/6644 10:09 AM) Contraceptive History Oral contraceptives. Gravida 0 Para 0  Other Problems Leighton Ruff, MD; 0/34/7425 10:09 AM) Hypercholesterolemia Oophorectomy Bilateral.     Review of Systems General Present- Weight Loss. Not Present- Appetite Loss, Chills, Fatigue, Fever, Night Sweats and Weight Gain. Skin Not Present- Change in Wart/Mole, Dryness, Hives, Jaundice, New Lesions, Non-Healing Wounds, Rash and Ulcer. HEENT Present- Wears glasses/contact lenses. Not Present- Earache, Hearing Loss, Hoarseness, Nose Bleed, Oral Ulcers, Ringing in the Ears, Seasonal Allergies, Sinus Pain, Sore Throat, Visual Disturbances and Yellow Eyes. Respiratory Not Present- Bloody sputum, Chronic Cough, Difficulty Breathing, Snoring and Wheezing. Breast Not Present- Breast Mass, Breast Pain, Nipple Discharge and Skin Changes. Cardiovascular Not Present- Chest Pain, Difficulty Breathing Lying Down, Leg Cramps, Palpitations, Rapid Heart Rate, Shortness of Breath and Swelling of Extremities. Gastrointestinal Present- Bloating, Change in Bowel Habits and Excessive gas. Not Present- Abdominal Pain, Bloody Stool, Chronic diarrhea, Constipation, Difficulty Swallowing, Gets full quickly at meals, Hemorrhoids, Indigestion, Nausea, Rectal Pain and Vomiting. Female Genitourinary Not Present- Frequency, Nocturia, Painful Urination, Pelvic Pain and Urgency. Musculoskeletal Not Present- Back Pain, Joint Pain, Joint Stiffness, Muscle Pain, Muscle Weakness and Swelling of Extremities. Psychiatric Not Present- Anxiety, Bipolar, Change in Sleep Pattern, Depression, Fearful and Frequent crying. Endocrine Not Present- Cold Intolerance, Excessive Hunger, Hair Changes, Heat Intolerance, Hot flashes and New Diabetes. Hematology Not Present- Blood Thinners, Easy Bruising, Excessive bleeding, Gland  problems, HIV and Persistent Infections.   Physical Exam   General Mental Status-Alert. General Appearance-Cooperative. CV: RRR Lungs: CTA Abdomen Palpation/Percussion Palpation and Percussion of the abdomen reveal - Soft.    Assessment & Plan   RECTAL MASS (K62.89) Impression: 65 year old female with a proximal  rectal mass. This appears to be a large polyp that is endoscopically unresectable, but could harbor a early cancer. Either way, I recommend robotic-assisted low anterior resection. We have discussed this in detail today and all questions were answered. We will plan on getting her to the operating room as soon as possible. The surgery and anatomy were described to the patient as well as the risks of surgery and the possible complications. These include: Bleeding, deep abdominal infections and possible wound complications such as hernia and infection, damage to adjacent structures, leak of surgical connections, which can lead to other surgeries and possibly an ostomy, possible need for other procedures, such as abscess drains in radiology, possible prolonged hospital stay, possible diarrhea from removal of part of the colon, possible constipation from narcotics, possible bowel, bladder or sexual dysfunction if having rectal surgery, prolonged fatigue/weakness or appetite loss, possible early recurrence of of disease, possible complications of their medical problems such as heart disease or arrhythmias or lung problems, death (less than 1%). I believe the patient understands and wishes to proceed with the surgery.

## 2021-02-06 ENCOUNTER — Other Ambulatory Visit (HOSPITAL_COMMUNITY): Payer: Self-pay

## 2021-02-07 NOTE — Patient Instructions (Addendum)
DUE TO COVID-19 ONLY ONE VISITOR IS ALLOWED TO COME WITH YOU AND STAY IN THE WAITING ROOM ONLY DURING PRE OP AND PROCEDURE DAY OF SURGERY. THE 1 VISITOR  MAY VISIT WITH YOU AFTER SURGERY IN YOUR PRIVATE ROOM DURING VISITING HOURS ONLY!  YOU NEED TO HAVE A COVID 19 TEST ON: 02/18/21 @ 2:45 PM , THIS TEST MUST BE DONE BEFORE SURGERY,  COVID TESTING SITE [place] [address], IT IS ON THE RIGHT GOING OUT [address] APPROXIMATELY  2 MINUTES PAST [employer] ON THE RIGHT. ONCE YOUR COVID TEST IS COMPLETED,  PLEASE BEGIN THE QUARANTINE INSTRUCTIONS AS OUTLINED IN YOUR HANDOUT.                Regina Young   Your procedure is scheduled on: 02/21/21   Report to [facility] Main  Entrance   Report to short stay at: 5:15 AM    Call this number if you have problems the morning of surgery [phone]    Remember: DRINK 2 [place] AT  1000 PM AND 1 PRESURGERY DRINK THE DAY OF THE PROCEDURE 3 HOURS PRIOR TO SCHEDULED SURGERY. NO SOLIDS AFTER MIDNIGHT THE DAY PRIOR TO THE SURGERY. NOTHING BY MOUTH EXCEPT CLEAR LIQUIDS UNTIL THREE HOURS PRIOR TO SCHEDULED SURGERY. PLEASE FINISH PRESURGERY ENSURE DRINK PER SURGEON ORDER 3 HOURS PRIOR TO SCHEDULED SURGERY TIME WHICH NEEDS TO BE COMPLETED AT: 4:30 AM.  MAKE SURE YOU DRINK PLENTY FLUIDS THE DAY OF THE PREP.  CLEAR LIQUID DIET  Foods Allowed                                                                     Foods Excluded  Coffee and tea, regular and decaf                             liquids that you cannot  Plain Jell-O any favor except red or purple                                           see through such as: Fruit ices (not with fruit pulp)                                     milk, soups, orange juice  Iced Popsicles                                    All solid food Carbonated beverages, regular and diet                                    Cranberry, grape and apple  juices Sports drinks like Gatorade Lightly seasoned clear broth or consume(fat free) Sugar, honey syrup  Sample Menu Breakfast  Lunch                                     Supper Cranberry juice                    Beef broth                            Chicken broth Jell-O                                     Grape juice                           Apple juice Coffee or tea                        Jell-O                                      Popsicle                                                Coffee or tea                        Coffee or tea  _____________________________________________________________________  BRUSH YOUR TEETH MORNING OF SURGERY AND RINSE YOUR MOUTH OUT, NO CHEWING GUM CANDY OR MINTS.                             You may not have any metal on your body including hair pins and              piercings  Do not wear jewelry, make-up, lotions, powders or perfumes, deodorant             Do not wear nail polish on your fingernails.  Do not shave  48 hours prior to surgery.    Do not bring valuables to the hospital. [place].  Contacts, dentures or bridgework may not be worn into surgery.  Leave suitcase in the car. After surgery it may be brought to your room.     Patients discharged the day of surgery will not be allowed to drive home. IF YOU ARE HAVING SURGERY AND GOING HOME THE SAME DAY, YOU MUST HAVE AN ADULT TO DRIVE YOU HOME AND BE WITH YOU FOR 24 HOURS. YOU MAY GO HOME BY TAXI OR UBER OR ORTHERWISE, BUT AN ADULT MUST ACCOMPANY YOU HOME AND STAY WITH YOU FOR 24 HOURS.  Name and phone number of your driver:  Special Instructions: N/A              Please read over the following fact sheets you were given: _____________________________________________________________________        [facility] - Preparing for Surgery Before surgery, you can play an important role.  Because skin is  not sterile,  your skin needs to be as free of germs as possible.  You can reduce the number of germs on your skin by washing with CHG (chlorahexidine gluconate) soap before surgery.  CHG is an antiseptic cleaner which kills germs and bonds with the skin to continue killing germs even after washing. Please DO NOT use if you have an allergy to CHG or antibacterial soaps.  If your skin becomes reddened/irritated stop using the CHG and inform your nurse when you arrive at Short Stay. Do not shave (including legs and underarms) for at least 48 hours prior to the first CHG shower.  You may shave your face/neck. Please follow these instructions carefully:  1.  Shower with CHG Soap the night before surgery and the  morning of Surgery.  2.  If you choose to wash your hair, wash your hair first as usual with your  normal  shampoo.  3.  After you shampoo, rinse your hair and body thoroughly to remove the  shampoo.                           4.  Use CHG as you would any other liquid soap.  You can apply chg directly  to the skin and wash                       Gently with a scrungie or clean washcloth.  5.  Apply the CHG Soap to your body ONLY FROM THE NECK DOWN.   Do not use on face/ open                           Wound or open sores. Avoid contact with eyes, ears mouth and genitals (private parts).                       Wash face,  Genitals (private parts) with your normal soap.             6.  Wash thoroughly, paying special attention to the area where your surgery  will be performed.  7.  Thoroughly rinse your body with warm water from the neck down.  8.  DO NOT shower/wash with your normal soap after using and rinsing off  the CHG Soap.                9.  Pat yourself dry with a clean towel.            10.  Wear clean pajamas.            11.  Place clean sheets on your bed the night of your first shower and do not  sleep with pets. Day of Surgery : Do not apply any lotions/deodorants the morning of surgery.  Please wear  clean clothes to the hospital/surgery center.  FAILURE TO FOLLOW THESE INSTRUCTIONS MAY RESULT IN THE CANCELLATION OF YOUR SURGERY PATIENT SIGNATURE_________________________________  NURSE SIGNATURE__________________________________  ________________________________________________________________________

## 2021-02-10 ENCOUNTER — Other Ambulatory Visit: Payer: Self-pay

## 2021-02-10 ENCOUNTER — Encounter (HOSPITAL_COMMUNITY): Payer: Self-pay

## 2021-02-10 ENCOUNTER — Encounter (HOSPITAL_COMMUNITY)
Admission: RE | Admit: 2021-02-10 | Discharge: 2021-02-10 | Disposition: A | Discharge status: Home / Self Care | Payer: BC Managed Care – PPO | Source: Ambulatory Visit | Attending: General Surgery | Admitting: General Surgery

## 2021-02-10 DIAGNOSIS — Z01812 Encounter for preprocedural laboratory examination: Secondary | ICD-10-CM | POA: Insufficient documentation

## 2021-02-10 LAB — CBC
HCT: 38.6 % (ref 36.0–46.0)
Hemoglobin: 12.5 g/dL (ref 12.0–15.0)
MCH: 31 pg (ref 26.0–34.0)
MCHC: 32.4 g/dL (ref 30.0–36.0)
MCV: 95.8 fL (ref 80.0–100.0)
Platelets: 265 10*3/uL (ref 150–400)
RBC: 4.03 MIL/uL (ref 3.87–5.11)
RDW: 14 % (ref 11.5–15.5)
WBC: 7.2 10*3/uL (ref 4.0–10.5)
nRBC: 0 % (ref 0.0–0.2)

## 2021-02-10 NOTE — Progress Notes (Signed)
COVID Vaccine Completed: Yes Date COVID Vaccine completed: 09/20/20 [place] COVID vaccine manufacturer: Pfizer     PCP - Dr. Lajean Manes Cardiologist -   Chest x-ray -  EKG -  Stress Test -  ECHO -  Cardiac Cath -  Pacemaker/ICD device last checked:  Sleep Study -  CPAP -   Fasting Blood Sugar -  Checks Blood Sugar _____ times a day  Blood Thinner Instructions: Aspirin Instructions: Last Dose:  Anesthesia review:   Patient denies shortness of breath, fever, cough and chest pain at PAT appointment   Patient verbalized understanding of instructions that were given to them at the PAT appointment. Patient was also instructed that they will need to review over the PAT instructions again at home before surgery.

## 2021-02-18 ENCOUNTER — Other Ambulatory Visit (HOSPITAL_COMMUNITY)
Admission: RE | Admit: 2021-02-18 | Discharge: 2021-02-18 | Disposition: A | Discharge status: Home / Self Care | Payer: BC Managed Care – PPO | Source: Ambulatory Visit | Attending: General Surgery | Admitting: General Surgery

## 2021-02-18 DIAGNOSIS — Z833 Family history of diabetes mellitus: Secondary | ICD-10-CM | POA: Diagnosis not present

## 2021-02-18 DIAGNOSIS — Z01812 Encounter for preprocedural laboratory examination: Secondary | ICD-10-CM | POA: Insufficient documentation

## 2021-02-18 DIAGNOSIS — E7800 Pure hypercholesterolemia, unspecified: Secondary | ICD-10-CM | POA: Diagnosis not present

## 2021-02-18 DIAGNOSIS — K621 Rectal polyp: Secondary | ICD-10-CM | POA: Diagnosis not present

## 2021-02-18 DIAGNOSIS — C19 Malignant neoplasm of rectosigmoid junction: Secondary | ICD-10-CM | POA: Diagnosis not present

## 2021-02-18 DIAGNOSIS — E785 Hyperlipidemia, unspecified: Secondary | ICD-10-CM | POA: Diagnosis not present

## 2021-02-18 DIAGNOSIS — Z8249 Family history of ischemic heart disease and other diseases of the circulatory system: Secondary | ICD-10-CM | POA: Diagnosis not present

## 2021-02-18 DIAGNOSIS — Z808 Family history of malignant neoplasm of other organs or systems: Secondary | ICD-10-CM | POA: Diagnosis not present

## 2021-02-18 DIAGNOSIS — N736 Female pelvic peritoneal adhesions (postinfective): Secondary | ICD-10-CM | POA: Diagnosis not present

## 2021-02-18 DIAGNOSIS — D127 Benign neoplasm of rectosigmoid junction: Secondary | ICD-10-CM | POA: Diagnosis not present

## 2021-02-18 DIAGNOSIS — K6289 Other specified diseases of anus and rectum: Secondary | ICD-10-CM | POA: Diagnosis not present

## 2021-02-18 DIAGNOSIS — F17210 Nicotine dependence, cigarettes, uncomplicated: Secondary | ICD-10-CM | POA: Diagnosis not present

## 2021-02-18 DIAGNOSIS — Z20822 Contact with and (suspected) exposure to covid-19: Secondary | ICD-10-CM | POA: Diagnosis not present

## 2021-02-18 LAB — SARS CORONAVIRUS 2 (TAT 6-24 HRS): SARS Coronavirus 2: NEGATIVE

## 2021-02-20 MED ORDER — BUPIVACAINE LIPOSOME 1.3 % IJ SUSP
20.0000 mL | Freq: Once | INTRAMUSCULAR | Status: DC
  Filled 2021-02-20: qty 20

## 2021-02-20 NOTE — Anesthesia Preprocedure Evaluation (Addendum)
Anesthesia Evaluation  Patient identified by MRN, date of birth, ID band Patient awake    Reviewed: Allergy & Precautions, NPO status , Patient's Chart, lab work & pertinent test results  Airway Mallampati: III  TM Distance: >3 FB Neck ROM: Full    Dental no notable dental hx. (+) Teeth Intact, Dental Advisory Given   Pulmonary neg pulmonary ROS, Current Smoker and Patient abstained from smoking.,    Pulmonary exam normal breath sounds clear to auscultation       Cardiovascular Normal cardiovascular exam Rhythm:Regular Rate:Normal  HLD   Neuro/Psych negative neurological ROS  negative psych ROS   GI/Hepatic negative GI ROS, Neg liver ROS,   Endo/Other  negative endocrine ROS  Renal/GU negative Renal ROS  negative genitourinary   Musculoskeletal negative musculoskeletal ROS (+)   Abdominal   Peds  Hematology negative hematology ROS (+)   Anesthesia Other Findings   Reproductive/Obstetrics                            Anesthesia Physical Anesthesia Plan  ASA: 2  Anesthesia Plan: General   Post-op Pain Management:    Induction: Intravenous  PONV Risk Score and Plan: 2 and Midazolam, Dexamethasone and Ondansetron  Airway Management Planned: Oral ETT  Additional Equipment:   Intra-op Plan:   Post-operative Plan: Extubation in OR  Informed Consent: I have reviewed the patients History and Physical, chart, labs and discussed the procedure including the risks, benefits and alternatives for the proposed anesthesia with the patient or authorized representative who has indicated his/her understanding and acceptance.     Dental advisory given  Plan Discussed with: CRNA  Anesthesia Plan Comments:         Anesthesia Quick Evaluation

## 2021-02-21 ENCOUNTER — Inpatient Hospital Stay (HOSPITAL_COMMUNITY): Payer: BC Managed Care – PPO | Admitting: Anesthesiology

## 2021-02-21 ENCOUNTER — Other Ambulatory Visit: Payer: Self-pay

## 2021-02-21 ENCOUNTER — Encounter (HOSPITAL_COMMUNITY)
Admission: RE | Disposition: A | Discharge status: Home / Self Care | Payer: Self-pay | Source: Other Acute Inpatient Hospital | Attending: General Surgery

## 2021-02-21 ENCOUNTER — Inpatient Hospital Stay (HOSPITAL_COMMUNITY)
Admission: RE | Admit: 2021-02-21 | Discharge: 2021-02-23 | DRG: 331 | Disposition: A | Discharge status: Home / Self Care | Payer: BC Managed Care – PPO | Source: Other Acute Inpatient Hospital | Attending: General Surgery | Admitting: General Surgery

## 2021-02-21 ENCOUNTER — Encounter (HOSPITAL_COMMUNITY): Payer: Self-pay | Admitting: General Surgery

## 2021-02-21 DIAGNOSIS — K621 Rectal polyp: Secondary | ICD-10-CM | POA: Diagnosis present

## 2021-02-21 DIAGNOSIS — Z808 Family history of malignant neoplasm of other organs or systems: Secondary | ICD-10-CM

## 2021-02-21 DIAGNOSIS — N736 Female pelvic peritoneal adhesions (postinfective): Secondary | ICD-10-CM | POA: Diagnosis present

## 2021-02-21 DIAGNOSIS — C19 Malignant neoplasm of rectosigmoid junction: Secondary | ICD-10-CM | POA: Diagnosis present

## 2021-02-21 DIAGNOSIS — Z8249 Family history of ischemic heart disease and other diseases of the circulatory system: Secondary | ICD-10-CM | POA: Diagnosis not present

## 2021-02-21 DIAGNOSIS — Z20822 Contact with and (suspected) exposure to covid-19: Secondary | ICD-10-CM | POA: Diagnosis present

## 2021-02-21 DIAGNOSIS — Z833 Family history of diabetes mellitus: Secondary | ICD-10-CM | POA: Diagnosis not present

## 2021-02-21 DIAGNOSIS — C187 Malignant neoplasm of sigmoid colon: Secondary | ICD-10-CM

## 2021-02-21 DIAGNOSIS — F17210 Nicotine dependence, cigarettes, uncomplicated: Secondary | ICD-10-CM | POA: Diagnosis present

## 2021-02-21 DIAGNOSIS — E7800 Pure hypercholesterolemia, unspecified: Secondary | ICD-10-CM | POA: Diagnosis present

## 2021-02-21 DIAGNOSIS — K6289 Other specified diseases of anus and rectum: Secondary | ICD-10-CM | POA: Diagnosis present

## 2021-02-21 HISTORY — DX: Malignant neoplasm of sigmoid colon: C18.7

## 2021-02-21 HISTORY — PX: XI ROBOTIC ASSISTED LOWER ANTERIOR RESECTION: SHX6558

## 2021-02-21 LAB — COMPREHENSIVE METABOLIC PANEL
ALT: 15 U/L (ref 0–44)
AST: 16 U/L (ref 15–41)
Albumin: 3.7 g/dL (ref 3.5–5.0)
Alkaline Phosphatase: 56 U/L (ref 38–126)
Anion gap: 5 (ref 5–15)
BUN: 5 mg/dL — ABNORMAL LOW (ref 8–23)
CO2: 22 mmol/L (ref 22–32)
Calcium: 8.1 mg/dL — ABNORMAL LOW (ref 8.9–10.3)
Chloride: 112 mmol/L — ABNORMAL HIGH (ref 98–111)
Creatinine, Ser: 0.89 mg/dL (ref 0.44–1.00)
GFR, Estimated: >60 mL/min (ref >60)
Glucose, Bld: 114 mg/dL — ABNORMAL HIGH (ref 70–99)
Potassium: 3.2 mmol/L — ABNORMAL LOW (ref 3.5–5.1)
Sodium: 139 mmol/L (ref 135–145)
Total Bilirubin: 0.6 mg/dL (ref 0.3–1.2)
Total Protein: 5.6 g/dL — ABNORMAL LOW (ref 6.5–8.1)

## 2021-02-21 SURGERY — RESECTION, RECTUM, LOW ANTERIOR, ROBOT-ASSISTED
Anesthesia: General | Site: Abdomen

## 2021-02-21 MED ORDER — HYDROMORPHONE HCL 2 MG/ML IJ SOLN
INTRAMUSCULAR | Status: AC
  Filled 2021-02-21: qty 1

## 2021-02-21 MED ORDER — BUPIVACAINE-EPINEPHRINE (PF) 0.25% -1:200000 IJ SOLN
INTRAMUSCULAR | Status: AC
  Filled 2021-02-21: qty 30

## 2021-02-21 MED ORDER — PROPOFOL 10 MG/ML IV BOLUS
INTRAVENOUS | Status: AC
  Filled 2021-02-21: qty 20

## 2021-02-21 MED ORDER — MIDAZOLAM HCL 5 MG/5ML IJ SOLN
INTRAMUSCULAR | Status: DC | PRN
  Administered 2021-02-21: 2 mg via INTRAVENOUS

## 2021-02-21 MED ORDER — ONDANSETRON HCL 4 MG PO TABS
4.0000 mg | ORAL_TABLET | Freq: Four times a day (QID) | ORAL | Status: DC | PRN
Start: 2021-02-21 — End: 2021-02-23

## 2021-02-21 MED ORDER — FENTANYL CITRATE (PF) 100 MCG/2ML IJ SOLN: 25.0000 ug | INTRAMUSCULAR | Status: DC | PRN

## 2021-02-21 MED ORDER — HEPARIN SODIUM (PORCINE) 5000 UNIT/ML IJ SOLN
5000.0000 [IU] | Freq: Once | INTRAMUSCULAR | Status: AC
  Administered 2021-02-21: 5000 [IU] via SUBCUTANEOUS
  Filled 2021-02-21: qty 1

## 2021-02-21 MED ORDER — ENSURE PRE-SURGERY PO LIQD
592.0000 mL | Freq: Once | ORAL | Status: DC
  Filled 2021-02-21: qty 592

## 2021-02-21 MED ORDER — ENOXAPARIN SODIUM 300 MG/3ML IJ SOLN
20.0000 mg | INTRAMUSCULAR | Status: DC
  Administered 2021-02-22 – 2021-02-23 (×2): 20 mg via SUBCUTANEOUS
  Filled 2021-02-21 (×2): qty 0.2

## 2021-02-21 MED ORDER — ROCURONIUM BROMIDE 100 MG/10ML IV SOLN
INTRAVENOUS | Status: DC | PRN
  Administered 2021-02-21: 10 mg via INTRAVENOUS
  Administered 2021-02-21: 50 mg via INTRAVENOUS
  Administered 2021-02-21: 10 mg via INTRAVENOUS

## 2021-02-21 MED ORDER — HYDROMORPHONE HCL 1 MG/ML IJ SOLN
0.5000 mg | INTRAMUSCULAR | Status: DC | PRN
Start: 2021-02-21 — End: 2021-02-23

## 2021-02-21 MED ORDER — ALVIMOPAN 12 MG PO CAPS
12.0000 mg | ORAL_CAPSULE | ORAL | Status: AC
  Administered 2021-02-21: 12 mg via ORAL
  Filled 2021-02-21: qty 1

## 2021-02-21 MED ORDER — LACTATED RINGERS IV SOLN: INTRAVENOUS | Status: DC

## 2021-02-21 MED ORDER — ENSURE PRE-SURGERY PO LIQD
296.0000 mL | Freq: Once | ORAL | Status: DC
  Filled 2021-02-21: qty 296

## 2021-02-21 MED ORDER — DEXAMETHASONE SODIUM PHOSPHATE 10 MG/ML IJ SOLN
INTRAMUSCULAR | Status: DC | PRN
  Administered 2021-02-21: 5 mg via INTRAVENOUS

## 2021-02-21 MED ORDER — MIDAZOLAM HCL 2 MG/2ML IJ SOLN
INTRAMUSCULAR | Status: AC
  Filled 2021-02-21: qty 2

## 2021-02-21 MED ORDER — KCL IN DEXTROSE-NACL 20-5-0.45 MEQ/L-%-% IV SOLN
INTRAVENOUS | Status: DC
  Filled 2021-02-21: qty 1000

## 2021-02-21 MED ORDER — BUPIVACAINE LIPOSOME 1.3 % IJ SUSP
INTRAMUSCULAR | Status: DC | PRN
  Administered 2021-02-21: 20 mL

## 2021-02-21 MED ORDER — ONDANSETRON HCL 4 MG/2ML IJ SOLN: 4.0000 mg | Freq: Four times a day (QID) | INTRAMUSCULAR | Status: DC | PRN

## 2021-02-21 MED ORDER — SODIUM CHLORIDE 0.9 % IV SOLN
2.0000 g | INTRAVENOUS | Status: AC
  Administered 2021-02-21: 2 g via INTRAVENOUS
  Filled 2021-02-21: qty 2

## 2021-02-21 MED ORDER — SUGAMMADEX SODIUM 200 MG/2ML IV SOLN
INTRAVENOUS | Status: DC | PRN
  Administered 2021-02-21: 100 mg via INTRAVENOUS

## 2021-02-21 MED ORDER — LACTATED RINGERS IR SOLN
Status: DC | PRN
  Administered 2021-02-21: 1000 mL

## 2021-02-21 MED ORDER — ACETAMINOPHEN 500 MG PO TABS
1000.0000 mg | ORAL_TABLET | Freq: Once | ORAL | Status: AC
  Administered 2021-02-21: 1000 mg via ORAL
  Filled 2021-02-21: qty 2

## 2021-02-21 MED ORDER — FENTANYL CITRATE (PF) 100 MCG/2ML IJ SOLN
INTRAMUSCULAR | Status: DC | PRN
  Administered 2021-02-21 (×2): 50 ug via INTRAVENOUS

## 2021-02-21 MED ORDER — KETAMINE HCL 10 MG/ML IJ SOLN
INTRAMUSCULAR | Status: AC
  Filled 2021-02-21: qty 1

## 2021-02-21 MED ORDER — PHENYLEPHRINE HCL (PRESSORS) 10 MG/ML IV SOLN
INTRAVENOUS | Status: DC | PRN
  Administered 2021-02-21 (×2): 40 ug via INTRAVENOUS
  Administered 2021-02-21: 80 ug via INTRAVENOUS
  Administered 2021-02-21: 40 ug via INTRAVENOUS
  Administered 2021-02-21: 80 ug via INTRAVENOUS

## 2021-02-21 MED ORDER — TRAMADOL HCL 50 MG PO TABS
50.0000 mg | ORAL_TABLET | Freq: Four times a day (QID) | ORAL | Status: DC | PRN
Start: 2021-02-21 — End: 2021-02-23
  Administered 2021-02-22 – 2021-02-23 (×2): 50 mg via ORAL
  Filled 2021-02-21 (×2): qty 1

## 2021-02-21 MED ORDER — ENSURE SURGERY PO LIQD
237.0000 mL | Freq: Two times a day (BID) | ORAL | Status: DC
  Administered 2021-02-21 – 2021-02-23 (×4): 237 mL via ORAL

## 2021-02-21 MED ORDER — 0.9 % SODIUM CHLORIDE (POUR BTL) OPTIME
TOPICAL | Status: DC | PRN
  Administered 2021-02-21: 2000 mL

## 2021-02-21 MED ORDER — ENOXAPARIN SODIUM 40 MG/0.4ML IJ SOSY: 40.0000 mg | PREFILLED_SYRINGE | INTRAMUSCULAR | Status: DC

## 2021-02-21 MED ORDER — ACETAMINOPHEN 500 MG PO TABS: 1000.0000 mg | ORAL_TABLET | ORAL | Status: DC

## 2021-02-21 MED ORDER — SACCHAROMYCES BOULARDII 250 MG PO CAPS
250.0000 mg | ORAL_CAPSULE | Freq: Two times a day (BID) | ORAL | Status: DC
  Administered 2021-02-21 – 2021-02-23 (×4): 250 mg via ORAL
  Filled 2021-02-21 (×4): qty 1

## 2021-02-21 MED ORDER — FENTANYL CITRATE (PF) 100 MCG/2ML IJ SOLN
INTRAMUSCULAR | Status: AC
  Filled 2021-02-21: qty 2

## 2021-02-21 MED ORDER — PROPOFOL 10 MG/ML IV BOLUS
INTRAVENOUS | Status: DC | PRN
  Administered 2021-02-21: 80 mg via INTRAVENOUS

## 2021-02-21 MED ORDER — SIMETHICONE 80 MG PO CHEW: 40.0000 mg | CHEWABLE_TABLET | Freq: Four times a day (QID) | ORAL | Status: DC | PRN

## 2021-02-21 MED ORDER — LIDOCAINE HCL (CARDIAC) PF 100 MG/5ML IV SOSY
PREFILLED_SYRINGE | INTRAVENOUS | Status: DC | PRN
  Administered 2021-02-21: 40 mg via INTRAVENOUS

## 2021-02-21 MED ORDER — CHLORHEXIDINE GLUCONATE 0.12 % MT SOLN
15.0000 mL | Freq: Once | OROMUCOSAL | Status: AC
  Administered 2021-02-21: 15 mL via OROMUCOSAL

## 2021-02-21 MED ORDER — ORAL CARE MOUTH RINSE: 15.0000 mL | Freq: Once | OROMUCOSAL | Status: AC

## 2021-02-21 MED ORDER — GABAPENTIN 300 MG PO CAPS
300.0000 mg | ORAL_CAPSULE | Freq: Two times a day (BID) | ORAL | Status: DC
  Administered 2021-02-21 – 2021-02-23 (×5): 300 mg via ORAL
  Filled 2021-02-21 (×5): qty 1

## 2021-02-21 MED ORDER — HYDROMORPHONE HCL 1 MG/ML IJ SOLN
INTRAMUSCULAR | Status: DC | PRN
  Administered 2021-02-21 (×2): .5 mg via INTRAVENOUS

## 2021-02-21 MED ORDER — ONDANSETRON HCL 4 MG/2ML IJ SOLN
INTRAMUSCULAR | Status: DC | PRN
  Administered 2021-02-21: 4 mg via INTRAVENOUS

## 2021-02-21 MED ORDER — ALUM & MAG HYDROXIDE-SIMETH 200-200-20 MG/5ML PO SUSP: 30.0000 mL | Freq: Four times a day (QID) | ORAL | Status: DC | PRN

## 2021-02-21 MED ORDER — ACETAMINOPHEN 500 MG PO TABS
1000.0000 mg | ORAL_TABLET | Freq: Four times a day (QID) | ORAL | Status: DC
  Administered 2021-02-21 – 2021-02-23 (×8): 1000 mg via ORAL
  Filled 2021-02-21 (×8): qty 2

## 2021-02-21 MED ORDER — EPHEDRINE SULFATE 50 MG/ML IJ SOLN
INTRAMUSCULAR | Status: DC | PRN
  Administered 2021-02-21: 5 mg via INTRAVENOUS

## 2021-02-21 MED ORDER — KETAMINE HCL 10 MG/ML IJ SOLN
INTRAMUSCULAR | Status: DC | PRN
  Administered 2021-02-21 (×6): 5 mg via INTRAVENOUS

## 2021-02-21 MED ORDER — KETOROLAC TROMETHAMINE 30 MG/ML IJ SOLN
INTRAMUSCULAR | Status: DC | PRN
  Administered 2021-02-21: 30 mg via INTRAVENOUS

## 2021-02-21 MED ORDER — GABAPENTIN 300 MG PO CAPS
300.0000 mg | ORAL_CAPSULE | ORAL | Status: AC
  Administered 2021-02-21: 300 mg via ORAL
  Filled 2021-02-21: qty 1

## 2021-02-21 MED ORDER — LACTATED RINGERS IV SOLN: INTRAVENOUS | Status: DC | PRN

## 2021-02-21 MED ORDER — ALVIMOPAN 12 MG PO CAPS
12.0000 mg | ORAL_CAPSULE | Freq: Two times a day (BID) | ORAL | Status: DC
  Administered 2021-02-22: 12 mg via ORAL
  Filled 2021-02-21: qty 1

## 2021-02-21 MED ORDER — BUPIVACAINE-EPINEPHRINE 0.25% -1:200000 IJ SOLN
INTRAMUSCULAR | Status: DC | PRN
Start: 2021-02-21 — End: 2021-02-21
  Administered 2021-02-21: 30 mL

## 2021-02-21 SURGICAL SUPPLY — 88 items
BLADE EXTENDED COATED 6.5IN (ELECTRODE) IMPLANT
CANNULA REDUC XI 12-8 STAPL (CANNULA)
CANNULA REDUCER 12-8 DVNC XI (CANNULA) IMPLANT
CELLS DAT CNTRL 66122 CELL SVR (MISCELLANEOUS) IMPLANT
COVER SURGICAL LIGHT HANDLE (MISCELLANEOUS) ×4 IMPLANT
COVER TIP SHEARS 8 DVNC (MISCELLANEOUS) ×1 IMPLANT
COVER TIP SHEARS 8MM DA VINCI (MISCELLANEOUS) ×2
COVER WAND RF STERILE (DRAPES) ×2 IMPLANT
DECANTER SPIKE VIAL GLASS SM (MISCELLANEOUS) IMPLANT
DRAIN CHANNEL 19F RND (DRAIN) ×1 IMPLANT
DRAPE ARM DVNC X/XI (DISPOSABLE) ×4 IMPLANT
DRAPE COLUMN DVNC XI (DISPOSABLE) ×1 IMPLANT
DRAPE DA VINCI XI ARM (DISPOSABLE) ×8
DRAPE DA VINCI XI COLUMN (DISPOSABLE) ×2
DRAPE SURG IRRIG POUCH 19X23 (DRAPES) ×2 IMPLANT
DRSG OPSITE POSTOP 4X10 (GAUZE/BANDAGES/DRESSINGS) IMPLANT
DRSG OPSITE POSTOP 4X6 (GAUZE/BANDAGES/DRESSINGS) ×1 IMPLANT
DRSG OPSITE POSTOP 4X8 (GAUZE/BANDAGES/DRESSINGS) IMPLANT
ELECT PENCIL ROCKER SW 15FT (MISCELLANEOUS) ×2 IMPLANT
ELECT REM PT RETURN 15FT ADLT (MISCELLANEOUS) ×2 IMPLANT
ENDOLOOP SUT PDS II  0 18 (SUTURE)
ENDOLOOP SUT PDS II 0 18 (SUTURE) IMPLANT
EVACUATOR SILICONE 100CC (DRAIN) ×1 IMPLANT
GLOVE SURG ENC MOIS LTX SZ6.5 (GLOVE) ×6 IMPLANT
GLOVE SURG UNDER POLY LF SZ7 (GLOVE) ×4 IMPLANT
GOWN STRL REUS W/TWL XL LVL3 (GOWN DISPOSABLE) ×6 IMPLANT
GRASPER SUT TROCAR 14GX15 (MISCELLANEOUS) IMPLANT
HOLDER FOLEY CATH W/STRAP (MISCELLANEOUS) ×2 IMPLANT
IRRIG SUCT STRYKERFLOW 2 WTIP (MISCELLANEOUS) ×2
IRRIGATION SUCT STRKRFLW 2 WTP (MISCELLANEOUS) ×1 IMPLANT
KIT PROCEDURE DA VINCI SI (MISCELLANEOUS)
KIT PROCEDURE DVNC SI (MISCELLANEOUS) IMPLANT
KIT TURNOVER KIT A (KITS) ×2 IMPLANT
NDL INSUFFLATION 14GA 120MM (NEEDLE) ×1 IMPLANT
NEEDLE INSUFFLATION 14GA 120MM (NEEDLE) ×2 IMPLANT
PACK CARDIOVASCULAR III (CUSTOM PROCEDURE TRAY) ×2 IMPLANT
PACK COLON (CUSTOM PROCEDURE TRAY) ×2 IMPLANT
PAD POSITIONING PINK XL (MISCELLANEOUS) ×2 IMPLANT
PORT LAP GEL ALEXIS MED 5-9CM (MISCELLANEOUS) ×1 IMPLANT
RELOAD STAPLE 60 3.5 BLU DVNC (STAPLE) IMPLANT
RELOAD STAPLE 60 4.3 GRN DVNC (STAPLE) IMPLANT
RELOAD STAPLER 3.5X60 BLU DVNC (STAPLE) ×1 IMPLANT
RELOAD STAPLER 4.3X60 GRN DVNC (STAPLE) IMPLANT
RETRACTOR WND ALEXIS 18 MED (MISCELLANEOUS) IMPLANT
RTRCTR WOUND ALEXIS 18CM MED (MISCELLANEOUS)
SCISSORS LAP 5X35 DISP (ENDOMECHANICALS) IMPLANT
SEAL CANN UNIV 5-8 DVNC XI (MISCELLANEOUS) ×3 IMPLANT
SEAL XI 5MM-8MM UNIVERSAL (MISCELLANEOUS) ×8
SEALER VESSEL DA VINCI XI (MISCELLANEOUS) ×2
SEALER VESSEL EXT DVNC XI (MISCELLANEOUS) ×1 IMPLANT
SOLUTION ELECTROLUBE (MISCELLANEOUS) ×2 IMPLANT
STAPLER 60 DA VINCI SURE FORM (STAPLE) ×2
STAPLER 60 SUREFORM DVNC (STAPLE) IMPLANT
STAPLER CANNULA SEAL DVNC XI (STAPLE) IMPLANT
STAPLER CANNULA SEAL XI (STAPLE)
STAPLER ECHELON POWER CIR 29 (STAPLE) ×1 IMPLANT
STAPLER ECHELON POWER CIR 31 (STAPLE) IMPLANT
STAPLER RELOAD 3.5X60 BLU DVNC (STAPLE) ×1
STAPLER RELOAD 3.5X60 BLUE (STAPLE) ×2
STAPLER RELOAD 4.3X60 GREEN (STAPLE)
STAPLER RELOAD 4.3X60 GRN DVNC (STAPLE)
STOPCOCK 4 WAY LG BORE MALE ST (IV SETS) ×4 IMPLANT
SUT ETHILON 2 0 PS N (SUTURE) ×1 IMPLANT
SUT NOVA NAB DX-16 0-1 5-0 T12 (SUTURE) ×4 IMPLANT
SUT PROLENE 2 0 KS (SUTURE) IMPLANT
SUT SILK 2 0 (SUTURE) ×2
SUT SILK 2 0 SH CR/8 (SUTURE) IMPLANT
SUT SILK 2-0 18XBRD TIE 12 (SUTURE) ×1 IMPLANT
SUT SILK 3 0 (SUTURE)
SUT SILK 3 0 SH CR/8 (SUTURE) ×2 IMPLANT
SUT SILK 3-0 18XBRD TIE 12 (SUTURE) IMPLANT
SUT V-LOC BARB 180 2/0GR6 GS22 (SUTURE)
SUT VIC AB 2-0 SH 18 (SUTURE) IMPLANT
SUT VIC AB 2-0 SH 27 (SUTURE)
SUT VIC AB 2-0 SH 27X BRD (SUTURE) IMPLANT
SUT VIC AB 3-0 SH 18 (SUTURE) IMPLANT
SUT VIC AB 4-0 PS2 27 (SUTURE) ×4 IMPLANT
SUT VICRYL 0 UR6 27IN ABS (SUTURE) ×2 IMPLANT
SUTURE V-LC BRB 180 2/0GR6GS22 (SUTURE) IMPLANT
SYR 10ML ECCENTRIC (SYRINGE) ×2 IMPLANT
SYS LAPSCP GELPORT 120MM (MISCELLANEOUS)
SYSTEM LAPSCP GELPORT 120MM (MISCELLANEOUS) IMPLANT
TOWEL OR 17X26 10 PK STRL BLUE (TOWEL DISPOSABLE) IMPLANT
TOWEL OR NON WOVEN STRL DISP B (DISPOSABLE) ×2 IMPLANT
TRAY FOLEY MTR SLVR 16FR STAT (SET/KITS/TRAYS/PACK) ×2 IMPLANT
TROCAR ADV FIXATION 5X100MM (TROCAR) ×2 IMPLANT
TUBING CONNECTING 10 (TUBING) ×4 IMPLANT
TUBING INSUFFLATION 10FT LAP (TUBING) ×2 IMPLANT

## 2021-02-21 NOTE — Anesthesia Procedure Notes (Signed)
Procedure Name: Intubation Date/Time: 02/21/2021 7:23 AM Performed by: Freddrick March, MD Pre-anesthesia Checklist: Patient identified, Emergency Drugs available, Suction available, Patient being monitored and Timeout performed Patient Re-evaluated:Patient Re-evaluated prior to induction Oxygen Delivery Method: Circle system utilized Preoxygenation: Pre-oxygenation with 100% oxygen Induction Type: IV induction Ventilation: Mask ventilation without difficulty Laryngoscope Size: Mac and 3 Grade View: Grade I Tube type: Oral Tube size: 6.5 mm Number of attempts: 1 Airway Equipment and Method: Patient positioned with wedge pillow, Stylet and Bite block Placement Confirmation: ETT inserted through vocal cords under direct vision, positive ETCO2, CO2 detector and breath sounds checked- equal and bilateral Secured at: 20 cm Tube secured with: Tape Dental Injury: Teeth and Oropharynx as per pre-operative assessment

## 2021-02-21 NOTE — H&P (Signed)
The patient is a 65 year old female who presents with a colonic mass. 65 year old female who presents to the office for evaluation of a rectosigmoid mass seen on colonoscopy.  This was biopsied and tattooed.  Biopsy showed tubulovillous adenoma only.  Patient states that she has had difficulty with bloating and abdominal pain for the past few months.  She has approximately 15 pound unintentional weight loss.  She reports irregular bowel habits.  She has switched eating smaller meals to help with this.  She underwent a CT scan of the abdomen and pelvis.  This showed no sign of a colonic tumor definitively.  There was also no sign of metastatic disease.  She states her CEA level was normal.  Past surgical history significant for open hysterectomy in the 90s.   I recently repeated her flexible sigmoidoscopy and noted a mass in the rectosigmoid area approximately 12 cm from anal verge.  This does not appear to be endoscopically resectable.  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     Problem List/Past Medical Leighton Ruff, MD; 9/76/7341 10:09 AM) RECTAL MASS (K62.89)    Past Surgical History Leighton Ruff, MD; 9/37/9024 10:09 AM) Colon Polyp Removal - Colonoscopy  Hysterectomy (not due to cancer) - Complete    Diagnostic Studies History Leighton Ruff, MD; 0/97/3532 10:09 AM) Colonoscopy  within last year Mammogram  >3 years ago Pap Smear  >5 years ago   Allergies Janeann Forehand, CNA; 01/14/2021 9:46 AM) No Known Drug Allergies  [12/23/2020]: Allergies Reconciled    Medication History Bisacodyl EC  (5MG  Tablet DR, Oral) Active. Rosuvastatin Calcium  (5MG  Tablet, Oral) Active.   Social History Leighton Ruff, MD; 9/92/4268 10:09 AM) Alcohol use  Occasional alcohol use. Caffeine use  Coffee, Tea. No drug use  Tobacco use  Current every day smoker.   Family History Leighton Ruff, MD; 3/41/9622 10:09 AM) Cancer  Mother. Colon Polyps  Sister. Diabetes Mellitus   Sister. Heart Disease  Father. Hypertension  Sister. Melanoma  Father.   Pregnancy / Birth History Leighton Ruff, MD; 2/97/9892 10:09 AM) Contraceptive History  Oral contraceptives. Gravida  0 Para  0   Other Problems Leighton Ruff, MD; 09/25/4172 10:09 AM) Hypercholesterolemia  Oophorectomy  Bilateral.         Review of Systems General Present- Weight Loss. Not Present- Appetite Loss, Chills, Fatigue, Fever, Night Sweats and Weight Gain. Skin Not Present- Change in Wart/Mole, Dryness, Hives, Jaundice, New Lesions, Non-Healing Wounds, Rash and Ulcer. HEENT Present- Wears glasses/contact lenses. Not Present- Earache, Hearing Loss, Hoarseness, Nose Bleed, Oral Ulcers, Ringing in the Ears, Seasonal Allergies, Sinus Pain, Sore Throat, Visual Disturbances and Yellow Eyes. Respiratory Not Present- Bloody sputum, Chronic Cough, Difficulty Breathing, Snoring and Wheezing. Breast Not Present- Breast Mass, Breast Pain, Nipple Discharge and Skin Changes. Cardiovascular Not Present- Chest Pain, Difficulty Breathing Lying Down, Leg Cramps, Palpitations, Rapid Heart Rate, Shortness of Breath and Swelling of Extremities. Gastrointestinal Present- Bloating, Change in Bowel Habits and Excessive gas. Not Present- Abdominal Pain, Bloody Stool, Chronic diarrhea, Constipation, Difficulty Swallowing, Gets full quickly at meals, Hemorrhoids, Indigestion, Nausea, Rectal Pain and Vomiting. Female Genitourinary Not Present- Frequency, Nocturia, Painful Urination, Pelvic Pain and Urgency. Musculoskeletal Not Present- Back Pain, Joint Pain, Joint Stiffness, Muscle Pain, Muscle Weakness and Swelling of Extremities. Psychiatric Not Present- Anxiety, Bipolar, Change in Sleep Pattern, Depression, Fearful and Frequent crying. Endocrine Not Present- Cold Intolerance, Excessive Hunger, Hair Changes, Heat Intolerance, Hot flashes and New Diabetes. Hematology Not Present- Blood Thinners, Easy Bruising,  Excessive bleeding,  Gland problems, HIV and Persistent Infections.   BP 121/80   Pulse 79   Temp 98.2 F (36.8 C) (Oral)   Resp 16   Ht 5\' 2"  (1.575 m)   Wt 44 kg   SpO2 100%   BMI 17.74 kg/m     Physical Exam   General Mental Status - Alert. General Appearance - Cooperative. CV: RRR Lungs: CTA Abdomen Palpation/Percussion Palpation and Percussion of the abdomen reveal - Soft.       Assessment & Plan   RECTAL MASS (K62.89) Impression: 65 year old female with a proximal rectal mass. This appears to be a large polyp that is endoscopically unresectable, but could harbor a early cancer. Either way, I recommend robotic-assisted low anterior resection. We have discussed this in detail today and all questions were answered. We will plan on getting her to the operating room as soon as possible. The surgery and anatomy were described to the patient as well as the risks of surgery and the possible complications. These include: Bleeding, deep abdominal infections and possible wound complications such as hernia and infection, damage to adjacent structures, leak of surgical connections, which can lead to other surgeries and possibly an ostomy, possible need for other procedures, such as abscess drains in radiology, possible prolonged hospital stay, possible diarrhea from removal of part of the colon, possible constipation from narcotics, possible bowel, bladder or sexual dysfunction if having rectal surgery, prolonged fatigue/weakness or appetite loss, possible early recurrence of of disease, possible complications of their medical problems such as heart disease or arrhythmias or lung problems, death (less than 1%). I believe the patient understands and wishes to proceed with the surgery.

## 2021-02-21 NOTE — Transfer of Care (Signed)
Immediate Anesthesia Transfer of Care Note  Patient: Regina Young  Procedure(s) Performed: XI ROBOTIC ASSISTED LOWER ANTERIOR RESECTION (Abdomen)  Patient Location: PACU  Anesthesia Type:General  Level of Consciousness: drowsy  Airway & Oxygen Therapy: Patient Spontanous Breathing and Patient connected to face mask oxygen  Post-op Assessment: Report given to RN and Post -op Vital signs reviewed and stable  Post vital signs: Reviewed and stable  Last Vitals:  Vitals Value Taken Time  BP 114/78 02/21/21 0945  Temp    Pulse 83 02/21/21 0947  Resp 14 02/21/21 0947  SpO2 100 % 02/21/21 0947  Vitals shown include unvalidated device data.  Last Pain:  Vitals:   02/21/21 0543  TempSrc: Oral  PainSc:       Patients Stated Pain Goal: 4 (99/37/16 9678)  Complications: No notable events documented.

## 2021-02-21 NOTE — Op Note (Signed)
02/21/2021  9:37 AM  PATIENT:  Regina Young  65 y.o. female  Patient Care Team: Lajean Manes, MD as PCP - General (Internal Medicine)  PRE-OPERATIVE DIAGNOSIS:  RECTAL MASS  POST-OPERATIVE DIAGNOSIS:  RECTAL MASS  PROCEDURE:   XI ROBOTIC ASSISTED LOWER ANTERIOR RESECTION   Surgeon(s): Leighton Ruff, MD Ileana Roup, MD  ASSISTANT: Dr Dema Severin   ANESTHESIA:   local and general  EBL: 40 ml Total I/O In: 100 [IV Piggyback:66] Out: 32 [Urine:450; Blood:40]  Delay start of Pharmacological VTE agent (>24hrs) due to surgical blood loss or risk of bleeding:  no  DRAINS: none   SPECIMEN:  Source of Specimen:  rectosigmoid and final distal margin  DISPOSITION OF SPECIMEN:  PATHOLOGY  COUNTS:  YES  PLAN OF CARE: Admit to inpatient   PATIENT DISPOSITION:  PACU - hemodynamically stable.  INDICATION:    65 y.o. F with endoscopically unresectable proximal rectal polyp.  I recommended low anterior resection:  The anatomy & physiology of the digestive tract was discussed.  The pathophysiology was discussed.  Natural history risks without surgery was discussed.   I worked to give an overview of the disease and the frequent need to have multispecialty involvement.  I feel the risks of no intervention will lead to serious problems that outweigh the operative risks; therefore, I recommended a partial colectomy to remove the pathology.  Laparoscopic & open techniques were discussed.   Risks such as bleeding, infection, abscess, leak, reoperation, possible ostomy, hernia, heart attack, death, and other risks were discussed.  I noted a good likelihood this will help address the problem.   Goals of post-operative recovery were discussed as well.    The patient expressed understanding & wished to proceed with surgery.  OR FINDINGS:   Patient had large, spongy mass in the rectosigmoid junction.  No obvious metastatic disease on visceral parietal peritoneum or liver.  The  anastomosis rests 8 cm from the anal verge by rigid proctoscopy.  DESCRIPTION:   Informed consent was confirmed.  The patient underwent general anaesthesia without difficulty.  The patient was positioned appropriately.  VTE prevention in place.  The patient's abdomen was clipped, prepped, & draped in a sterile fashion.  Surgical timeout confirmed our plan.  The patient was positioned in reverse Trendelenburg.  Abdominal entry was gained using a Varies needle in the LUQ.  Entry was clean.  I induced carbon dioxide insufflation.  An 83mm robotic port was placed in the RUQ.  Camera inspection revealed no injury.  Extra ports were carefully placed under direct laparoscopic visualization.  I laparoscopically reflected the greater omentum and the upper abdomen the small bowel in the upper abdomen. The patient was appropriately positioned and the robot was docked to the patient's left side.  Instruments were placed under direct visualization.   I began by taking down a small bowel adhesion to the abdominal wall using sharp dissection.  I mobilized the sigmoid colon off of the pelvic sidewall.  There were significant adhesions to the left pelvic sidewall.  I divided enough of the colon to retract it out of the pelvis.  I scored the base of peritoneum of the right side of the mesentery of the left colon from the ligament of Treitz to the peritoneal reflection of the mid rectum.  I elevated the sigmoid mesentery and enetered into the retro-mesenteric plane. We were able to identify the left ureter and gonadal vessels after careful dissection. We kept those posterior within the retroperitoneum and elevated the  left colon mesentery off that. I did isolated IMA pedicle but did not ligate it yet.  I continued distally and got into the avascular plane posterior to the mesorectum. This allowed me to help mobilize the rectum as well by freeing the mesorectum off the sacrum.  I identified the tattoo in her proximal rectum.  I  mobilized the peritoneal coverings towards the peritoneal reflection on both the right and left sides of the rectum.  I could see the right and left ureters and stayed away from them. I mobilize cecal adhesions off of the right pelvic sidewall as well.     I skeletonized the inferior mesenteric artery pedicle.  I went down to its takeoff from the aorta.   After confirming the left ureter was out of the way, I went ahead and ligated the inferior mesenteric artery pedicle with bipolar robotic vessel sealer ~2cm above its takeoff from the aorta.  We ensured hemostasis. I continued mobilizing the mesentery off the left pelvic sidewall keeping the ureter down as a went.  I was finally able to free the entire sigmoid colon.  I skeletonized the mesorectum at the junction at the proximal rectum using blunt dissection & bipolar robotic vessel sealer.  I mobilized the left colon in a lateral to medial fashion off the line of Toldt up towards the splenic flexure to ensure good mobilization of the left colon to reach into the pelvis.  At this point a 12 mm port was placed in the suprapubic region.  The proximal rectum was divided at the previously dissected margin using a blue load robotic stapler.  At this point way inspected the abdomen.  Hemostasis was good.  The small bowel and cecum that were mobilized from the abdominal wall appeared free of injury.   The robotic instruments were then removed.  The suprapubic port was enlarged to a small Pfannenstiel incision.  An [place] wound protector was placed.  The colon was brought out through the wound and transected at the descending/sigmoid junction using a pursestring device.  A 2-0 Prolene pursestring was placed.  This was then tied around a 29 mm EEA anvil.  This was then placed back into the abdomen.  An anastomosis was created under laparoscopic visualization with an EEA stapler.  There was no tension on the anastomosis.  There was no leak when tested with insufflation  under irrigation.  I irrigated the entire abdomen with approximately 2 L of normal saline.  Ports and instruments were then removed.  We switched to clean gowns, gloves, instruments and drapes.  The peritoneum of the Pfannenstiel incision was closed using a running 0 Vicryl suture.  The fascia was closed using interrupted #1 Novafil sutures.  The subcutaneous tissue was reapproximated with a running 2-0 Vicryl suture and the skin was closed using a running 4-0 Vicryl subcuticular suture.  A sterile dressing was placed over this.  The remaining port sites were closed using 4-0 Vicryl suture and Dermabond.  The patient was then awakened from anesthesia and sent to the postanesthesia care unit in stable condition.  All counts were correct per operating room staff. An MD assistant was necessary for tissue manipulation, retraction and positioning due to the complexity of the case and hospital policies

## 2021-02-21 NOTE — Anesthesia Postprocedure Evaluation (Signed)
Anesthesia Post Note  Patient: Regina Young  Procedure(s) Performed: XI ROBOTIC ASSISTED LOWER ANTERIOR RESECTION (Abdomen)     Patient location during evaluation: PACU Anesthesia Type: General Level of consciousness: awake and alert Pain management: pain level controlled Vital Signs Assessment: post-procedure vital signs reviewed and stable Respiratory status: spontaneous breathing, nonlabored ventilation, respiratory function stable and patient connected to nasal cannula oxygen Cardiovascular status: blood pressure returned to baseline and stable Postop Assessment: no apparent nausea or vomiting Anesthetic complications: no   No notable events documented.  Last Vitals:  Vitals:   02/21/21 1230 02/21/21 1324  BP: 91/63 98/67  Pulse: 82 87  Resp: 16 18  Temp:  36.9 C  SpO2: 98% 99%    Last Pain:  Vitals:   02/21/21 1324  TempSrc: Oral  PainSc:                  Rishika Mccollom L Kallon Caylor

## 2021-02-22 ENCOUNTER — Encounter (HOSPITAL_COMMUNITY): Payer: Self-pay | Admitting: General Surgery

## 2021-02-22 LAB — CBC
HCT: 32.9 % — ABNORMAL LOW (ref 36.0–46.0)
Hemoglobin: 10.5 g/dL — ABNORMAL LOW (ref 12.0–15.0)
MCH: 31.3 pg (ref 26.0–34.0)
MCHC: 31.9 g/dL (ref 30.0–36.0)
MCV: 97.9 fL (ref 80.0–100.0)
Platelets: 211 10*3/uL (ref 150–400)
RBC: 3.36 MIL/uL — ABNORMAL LOW (ref 3.87–5.11)
RDW: 13.9 % (ref 11.5–15.5)
WBC: 10.7 10*3/uL — ABNORMAL HIGH (ref 4.0–10.5)
nRBC: 0 % (ref 0.0–0.2)

## 2021-02-22 LAB — BASIC METABOLIC PANEL
Anion gap: 5 (ref 5–15)
BUN: 9 mg/dL (ref 8–23)
CO2: 25 mmol/L (ref 22–32)
Calcium: 8.5 mg/dL — ABNORMAL LOW (ref 8.9–10.3)
Chloride: 108 mmol/L (ref 98–111)
Creatinine, Ser: 0.72 mg/dL (ref 0.44–1.00)
GFR, Estimated: >60 mL/min (ref >60)
Glucose, Bld: 99 mg/dL (ref 70–99)
Potassium: 3.8 mmol/L (ref 3.5–5.1)
Sodium: 138 mmol/L (ref 135–145)

## 2021-02-22 NOTE — Progress Notes (Signed)
1 Day Post-Op   Subjective/Chief Complaint: No n/v Has voided Adv to FLD this am - did well - had grits and sprite 2 BMs Incision was pretty sore this am when got out of bed Ambulated twice yesterday   Objective: Vital signs in last 24 hours: Temp:  [97.6 F (36.4 C)-98.7 F (37.1 C)] 98.3 F (36.8 C) (06/18 0911) Pulse Rate:  [72-87] 79 (06/18 0911) Resp:  [8-18] 16 (06/18 0911) BP: (91-121)/(62-97) 106/78 (06/18 0911) SpO2:  [96 %-100 %] 98 % (06/18 0911) Weight:  [43.8 kg] 43.8 kg (06/18 0500) Last BM Date: 02/21/21  Intake/Output from previous day: 06/17 0701 - 06/18 0700 In: 3443.7 [P.O.:720; I.V.:2623.7; IV Piggyback:100] Out: 2365 [Urine:2325; Blood:40] Intake/Output this shift: No intake/output data recorded.  Sitting in chair Nontoxic, smiling Nonlabored Reg Soft, very mild distension in lower abd, dressing ok, mild approp TTP  Lab Results:  Recent Labs    02/22/21 0530  WBC 10.7*  HGB 10.5*  HCT 32.9*  PLT 211   BMET Recent Labs    02/21/21 0800 02/22/21 0530  NA 139 138  K 3.2* 3.8  CL 112* 108  CO2 22 25  GLUCOSE 114* 99  BUN 5* 9  CREATININE 0.89 0.72  CALCIUM 8.1* 8.5*   PT/INR No results for input(s): LABPROT, INR in the last 72 hours. ABG No results for input(s): PHART, HCO3 in the last 72 hours.  Invalid input(s): PCO2, PO2  Studies/Results: No results found.  Anti-infectives: Anti-infectives (From admission, onward)    Start     Dose/Rate Route Frequency Ordered Stop   02/21/21 0600  cefoTEtan (CEFOTAN) 2 g in sodium chloride 0.9 % 100 mL IVPB        2 g 200 mL/hr over 30 Minutes Intravenous On call to O.R. 02/21/21 0522 02/21/21 0730       Assessment/Plan: s/p Procedure(s): XI ROBOTIC ASSISTED LOWER ANTERIOR RESECTION (N/A) POD 1 Dr Marcello Moores  Cont FLD Adv to soft diet tonight if no issues with FLD  Cont chemical vte prophylaxis Repeat cbc in am Probable home Sunday ambulate  LOS: 1 day    Greer Pickerel 02/22/2021

## 2021-02-23 LAB — CBC
HCT: 31.5 % — ABNORMAL LOW (ref 36.0–46.0)
Hemoglobin: 10.1 g/dL — ABNORMAL LOW (ref 12.0–15.0)
MCH: 31.5 pg (ref 26.0–34.0)
MCHC: 32.1 g/dL (ref 30.0–36.0)
MCV: 98.1 fL (ref 80.0–100.0)
Platelets: 202 10*3/uL (ref 150–400)
RBC: 3.21 MIL/uL — ABNORMAL LOW (ref 3.87–5.11)
RDW: 14 % (ref 11.5–15.5)
WBC: 8.8 10*3/uL (ref 4.0–10.5)
nRBC: 0 % (ref 0.0–0.2)

## 2021-02-23 LAB — BASIC METABOLIC PANEL
Anion gap: 4 — ABNORMAL LOW (ref 5–15)
BUN: 12 mg/dL (ref 8–23)
CO2: 27 mmol/L (ref 22–32)
Calcium: 8.4 mg/dL — ABNORMAL LOW (ref 8.9–10.3)
Chloride: 109 mmol/L (ref 98–111)
Creatinine, Ser: 0.66 mg/dL (ref 0.44–1.00)
GFR, Estimated: >60 mL/min (ref >60)
Glucose, Bld: 87 mg/dL (ref 70–99)
Potassium: 4 mmol/L (ref 3.5–5.1)
Sodium: 140 mmol/L (ref 135–145)

## 2021-02-23 MED ORDER — TRAMADOL HCL 50 MG PO TABS: 50.0000 mg | ORAL_TABLET | Freq: Four times a day (QID) | ORAL | 0 refills | Status: DC | PRN

## 2021-02-23 NOTE — Plan of Care (Signed)
Pt was discharged home today. Instructions were reviewed with patient, and questions were answered. Pt was taken to main entrance via wheelchair by NT.  

## 2021-02-23 NOTE — Discharge Instructions (Signed)
[facility]      [facility] [phone]  OPEN ABDOMINAL SURGERY: POST OP INSTRUCTIONS  Always review your discharge instruction sheet given to you by the facility where your surgery was performed.  IF YOU HAVE DISABILITY OR FAMILY LEAVE FORMS, YOU MUST BRING THEM TO THE OFFICE FOR PROCESSING.  PLEASE DO NOT GIVE THEM TO YOUR DOCTOR.  A prescription for pain medication may be given to you upon discharge.  Take your pain medication as prescribed, if needed.  If narcotic pain medicine is not needed, then you may take acetaminophen (Tylenol) or ibuprofen (Advil) as needed. Take your usually prescribed medications unless otherwise directed. If you need a refill on your pain medication, please contact your pharmacy. They will contact our office to request authorization.  Prescriptions will not be filled after 5pm or on week-ends. You should follow a light diet the first few days after arrival home, such as soup and crackers, pudding, etc.unless your doctor has advised otherwise. A high-fiber, low fat diet can be resumed as tolerated.   Be sure to include lots of fluids daily. Most patients will experience some swelling and bruising on the chest and neck area.  Ice packs will help.  Swelling and bruising can take several days to resolve Most patients will experience some swelling and bruising in the area of the incision. Ice pack will help. Swelling and bruising can take several days to resolve..  It is common to experience some constipation if taking pain medication after surgery.  Increasing fluid intake and taking a stool softener will usually help or prevent this problem from occurring.  A mild laxative (Milk of Magnesia or Miralax) should be taken according to package directions if there are no bowel movements after 48 hours.  You may have steri-strips (small skin tapes) in place directly over the incision.  These strips should be left on the skin for 7-10 days.  If your surgeon used skin  glue on the incision, you may shower in 24 hours.  The glue will flake off over the next 2-3 weeks.  Any sutures or staples will be removed at the office during your follow-up visit. You may find that a light gauze bandage over your incision may keep your staples from being rubbed or pulled. You may shower and replace the bandage daily. ACTIVITIES:  You may resume regular (light) daily activities beginning the next day--such as daily self-care, walking, climbing stairs--gradually increasing activities as tolerated.  You may have sexual intercourse when it is comfortable.  Refrain from any heavy lifting or straining until approved by your doctor. You may drive when you no longer are taking prescription pain medication, you can comfortably wear a seatbelt, and you can safely maneuver your car and apply brakes Return to Work: ___________________________________ You should see your doctor in the office for a follow-up appointment approximately two weeks after your surgery.  Make sure that you call for this appointment within a day or two after you arrive home to insure a convenient appointment time. OTHER INSTRUCTIONS:  _____________________________________________________________ _____________________________________________________________  WHEN TO CALL YOUR DOCTOR: Fever over 101.0 Inability to urinate Nausea and/or vomiting Extreme swelling or bruising Continued bleeding from incision. Increased pain, redness, or drainage from the incision. Difficulty swallowing or breathing Muscle cramping or spasms. Numbness or tingling in hands or feet or around lips.  The clinic staff is available to answer your questions during regular business hours.  Please don't hesitate to call and ask to speak to one of   the nurses if you have concerns.  For further questions, please visit [email or web address]  

## 2021-02-23 NOTE — Discharge Summary (Signed)
Physician Discharge Summary  Patient ID: Regina Young MRN: [id number] DOB/AGE: 07-Dec-1955 65 y.o.  Admit date: 02/21/2021 Discharge date: 02/23/2021  Admission Diagnoses:rectal mass  Discharge Diagnoses:  Active Problems:   Rectal mass   Discharged Condition: good  Hospital Course: Pt admitted 02/21/2021 and underwent Robotic LAR for rectal mass. She did well  postoperatively.  She had good pain control, had a BM,tolerated her diet and had no issues voiding.  She was D/C home on POD 2  Consults: None  Significant Diagnostic Studies: labs: CBC    Component Value Date/Time   WBC 8.8 02/23/2021 0528   RBC 3.21 (L) 02/23/2021 0528   HGB 10.1 (L) 02/23/2021 0528   HCT 31.5 (L) 02/23/2021 0528   PLT 202 02/23/2021 0528   MCV 98.1 02/23/2021 0528   MCH 31.5 02/23/2021 0528   MCHC 32.1 02/23/2021 0528   RDW 14.0 02/23/2021 0528     Treatments: surgery: robotic LAR   Discharge Exam: Blood pressure 96/69, pulse 78, temperature 98.4 F (36.9 C), temperature source Oral, resp. rate 18, height 5\' 2"  (1.575 m), weight 43.8 kg, SpO2 95 %. General appearance: alert Resp: clear to auscultation bilaterally Cardio: regular rate and rhythm Incision/Wound:CDI soft ND abdomen sore around the incision   Disposition: Discharge disposition: 01-Home or Self Care       Discharge Instructions     Diet - low sodium heart healthy   Complete by: As directed    Increase activity slowly   Complete by: As directed       Allergies as of 02/23/2021   No Known Allergies      Medication List     TAKE these medications    acetaminophen 500 MG tablet Commonly known as: TYLENOL Take 500-1,000 mg by mouth every 6 (six) hours as needed (PAIN).   ibuprofen 200 MG tablet Commonly known as: ADVIL Take 200-400 mg by mouth every 8 (eight) hours as needed (for pain.).   naproxen sodium 220 MG tablet Commonly known as: ALEVE Take 220 mg by mouth 2 (two) times daily as needed (pain).    rosuvastatin 5 MG tablet Commonly known as: CRESTOR Take 5 mg by mouth in the morning. (0800)   traMADol 50 MG tablet Commonly known as: ULTRAM Take 1 tablet (50 mg total) by mouth every 6 (six) hours as needed for moderate pain.         Signed: Joyice Faster Kelyn Ponciano 02/23/2021, 8:28 AM

## 2021-02-23 NOTE — Plan of Care (Signed)
  Problem: Education: Goal: Required Educational Video(s) 02/23/2021 0940 by Charlyne Petrin, RN Outcome: Completed/Met 02/23/2021 0940 by Charlyne Petrin, RN Outcome: Adequate for Discharge   Problem: Clinical Measurements: Goal: Ability to maintain clinical measurements within normal limits will improve 02/23/2021 0940 by Charlyne Petrin, RN Outcome: Completed/Met 02/23/2021 0940 by Charlyne Petrin, RN Outcome: Adequate for Discharge Goal: Postoperative complications will be avoided or minimized 02/23/2021 0940 by Charlyne Petrin, RN Outcome: Completed/Met 02/23/2021 0940 by Charlyne Petrin, RN Outcome: Adequate for Discharge   Problem: Skin Integrity: Goal: Demonstration of wound healing without infection will improve 02/23/2021 0940 by Charlyne Petrin, RN Outcome: Completed/Met 02/23/2021 0940 by Charlyne Petrin, RN Outcome: Adequate for Discharge

## 2021-02-24 ENCOUNTER — Other Ambulatory Visit (HOSPITAL_COMMUNITY): Payer: Self-pay

## 2021-02-24 ENCOUNTER — Other Ambulatory Visit: Payer: Self-pay

## 2021-02-25 LAB — SURGICAL PATHOLOGY

## 2021-02-28 ENCOUNTER — Encounter (HOSPITAL_COMMUNITY): Payer: Self-pay

## 2021-03-05 ENCOUNTER — Other Ambulatory Visit: Payer: Self-pay

## 2021-03-05 NOTE — Progress Notes (Signed)
The proposed treatment discussed in conference is for discussion purpose only and is not a binding recommendation.  The patients have not been physically examined, or presented with their treatment options.  Therefore, final treatment plans cannot be decided.  

## 2021-03-21 ENCOUNTER — Other Ambulatory Visit: Payer: Self-pay | Admitting: General Surgery

## 2021-03-21 DIAGNOSIS — C187 Malignant neoplasm of sigmoid colon: Secondary | ICD-10-CM

## 2021-04-01 DIAGNOSIS — C187 Malignant neoplasm of sigmoid colon: Secondary | ICD-10-CM | POA: Diagnosis not present

## 2021-04-08 DIAGNOSIS — Z85038 Personal history of other malignant neoplasm of large intestine: Secondary | ICD-10-CM | POA: Diagnosis not present

## 2021-04-08 DIAGNOSIS — K5900 Constipation, unspecified: Secondary | ICD-10-CM | POA: Diagnosis not present

## 2021-04-08 DIAGNOSIS — K2270 Barrett's esophagus without dysplasia: Secondary | ICD-10-CM | POA: Diagnosis not present

## 2021-04-11 ENCOUNTER — Ambulatory Visit
Admission: RE | Admit: 2021-04-11 | Discharge: 2021-04-11 | Disposition: A | Discharge status: Home / Self Care | Payer: BC Managed Care – PPO | Source: Ambulatory Visit | Attending: General Surgery | Admitting: General Surgery

## 2021-04-11 ENCOUNTER — Other Ambulatory Visit: Payer: Self-pay

## 2021-04-11 DIAGNOSIS — R911 Solitary pulmonary nodule: Secondary | ICD-10-CM | POA: Diagnosis not present

## 2021-04-11 DIAGNOSIS — I700 Atherosclerosis of aorta: Secondary | ICD-10-CM | POA: Diagnosis not present

## 2021-04-11 DIAGNOSIS — R918 Other nonspecific abnormal finding of lung field: Secondary | ICD-10-CM | POA: Diagnosis not present

## 2021-04-11 DIAGNOSIS — C187 Malignant neoplasm of sigmoid colon: Secondary | ICD-10-CM

## 2021-04-11 DIAGNOSIS — C189 Malignant neoplasm of colon, unspecified: Secondary | ICD-10-CM | POA: Diagnosis not present

## 2021-05-01 DIAGNOSIS — M810 Age-related osteoporosis without current pathological fracture: Secondary | ICD-10-CM | POA: Diagnosis not present

## 2021-05-01 DIAGNOSIS — I700 Atherosclerosis of aorta: Secondary | ICD-10-CM | POA: Diagnosis not present

## 2021-05-01 DIAGNOSIS — E7800 Pure hypercholesterolemia, unspecified: Secondary | ICD-10-CM | POA: Diagnosis not present

## 2021-05-01 DIAGNOSIS — F17210 Nicotine dependence, cigarettes, uncomplicated: Secondary | ICD-10-CM | POA: Diagnosis not present

## 2021-05-02 ENCOUNTER — Other Ambulatory Visit: Payer: Self-pay | Admitting: Geriatric Medicine

## 2021-05-02 DIAGNOSIS — M810 Age-related osteoporosis without current pathological fracture: Secondary | ICD-10-CM

## 2021-05-22 ENCOUNTER — Other Ambulatory Visit: Payer: BC Managed Care – PPO

## 2021-05-27 ENCOUNTER — Ambulatory Visit
Admission: RE | Admit: 2021-05-27 | Discharge: 2021-05-27 | Disposition: A | Discharge status: Home / Self Care | Payer: BC Managed Care – PPO | Source: Ambulatory Visit | Attending: Geriatric Medicine | Admitting: Geriatric Medicine

## 2021-05-27 ENCOUNTER — Other Ambulatory Visit: Payer: Self-pay

## 2021-05-27 DIAGNOSIS — M810 Age-related osteoporosis without current pathological fracture: Secondary | ICD-10-CM | POA: Diagnosis not present

## 2021-07-08 DIAGNOSIS — M810 Age-related osteoporosis without current pathological fracture: Secondary | ICD-10-CM | POA: Diagnosis not present

## 2021-07-08 DIAGNOSIS — Z79899 Other long term (current) drug therapy: Secondary | ICD-10-CM | POA: Diagnosis not present

## 2021-07-25 DIAGNOSIS — D225 Melanocytic nevi of trunk: Secondary | ICD-10-CM | POA: Diagnosis not present

## 2021-07-25 DIAGNOSIS — L7211 Pilar cyst: Secondary | ICD-10-CM | POA: Diagnosis not present

## 2021-09-17 ENCOUNTER — Other Ambulatory Visit: Payer: BC Managed Care – PPO

## 2021-11-10 DIAGNOSIS — I700 Atherosclerosis of aorta: Secondary | ICD-10-CM | POA: Diagnosis not present

## 2021-11-10 DIAGNOSIS — Z23 Encounter for immunization: Secondary | ICD-10-CM | POA: Diagnosis not present

## 2021-11-10 DIAGNOSIS — Z0000 Encounter for general adult medical examination without abnormal findings: Secondary | ICD-10-CM | POA: Diagnosis not present

## 2021-11-10 DIAGNOSIS — Z79899 Other long term (current) drug therapy: Secondary | ICD-10-CM | POA: Diagnosis not present

## 2021-11-10 DIAGNOSIS — K909 Intestinal malabsorption, unspecified: Secondary | ICD-10-CM | POA: Diagnosis not present

## 2021-11-10 DIAGNOSIS — M810 Age-related osteoporosis without current pathological fracture: Secondary | ICD-10-CM | POA: Diagnosis not present

## 2021-11-13 ENCOUNTER — Other Ambulatory Visit: Payer: Self-pay | Admitting: Geriatric Medicine

## 2021-11-13 DIAGNOSIS — Z1231 Encounter for screening mammogram for malignant neoplasm of breast: Secondary | ICD-10-CM

## 2021-12-25 ENCOUNTER — Ambulatory Visit
Admission: RE | Admit: 2021-12-25 | Discharge: 2021-12-25 | Disposition: A | Discharge status: Home / Self Care | Payer: BC Managed Care – PPO | Source: Ambulatory Visit | Attending: Geriatric Medicine | Admitting: Geriatric Medicine

## 2021-12-25 DIAGNOSIS — Z1231 Encounter for screening mammogram for malignant neoplasm of breast: Secondary | ICD-10-CM

## 2022-04-27 ENCOUNTER — Other Ambulatory Visit: Payer: Self-pay | Admitting: Geriatric Medicine

## 2022-04-27 DIAGNOSIS — R911 Solitary pulmonary nodule: Secondary | ICD-10-CM

## 2022-05-12 DIAGNOSIS — F17210 Nicotine dependence, cigarettes, uncomplicated: Secondary | ICD-10-CM | POA: Diagnosis not present

## 2022-05-12 DIAGNOSIS — I700 Atherosclerosis of aorta: Secondary | ICD-10-CM | POA: Diagnosis not present

## 2022-05-12 DIAGNOSIS — E7800 Pure hypercholesterolemia, unspecified: Secondary | ICD-10-CM | POA: Diagnosis not present

## 2022-05-12 DIAGNOSIS — K219 Gastro-esophageal reflux disease without esophagitis: Secondary | ICD-10-CM | POA: Diagnosis not present

## 2022-05-21 ENCOUNTER — Ambulatory Visit
Admission: RE | Admit: 2022-05-21 | Discharge: 2022-05-21 | Disposition: A | Discharge status: Home / Self Care | Payer: BC Managed Care – PPO | Source: Ambulatory Visit | Attending: Geriatric Medicine | Admitting: Geriatric Medicine

## 2022-05-21 DIAGNOSIS — J439 Emphysema, unspecified: Secondary | ICD-10-CM | POA: Diagnosis not present

## 2022-05-21 DIAGNOSIS — R911 Solitary pulmonary nodule: Secondary | ICD-10-CM | POA: Diagnosis not present

## 2022-05-21 DIAGNOSIS — J9811 Atelectasis: Secondary | ICD-10-CM | POA: Diagnosis not present

## 2022-05-21 DIAGNOSIS — I700 Atherosclerosis of aorta: Secondary | ICD-10-CM | POA: Diagnosis not present

## 2022-05-27 ENCOUNTER — Other Ambulatory Visit: Payer: Self-pay | Admitting: Geriatric Medicine

## 2022-05-27 DIAGNOSIS — R161 Splenomegaly, not elsewhere classified: Secondary | ICD-10-CM

## 2022-06-10 IMAGING — CR DG CHEST 2V
2 series · 2 of 2 positions shown · non-contrast
Comparison: No priors.

CLINICAL DATA: 64-year-old female with history of chest pain.

EXAM:
CHEST - 2 VIEW

[w chest pa]
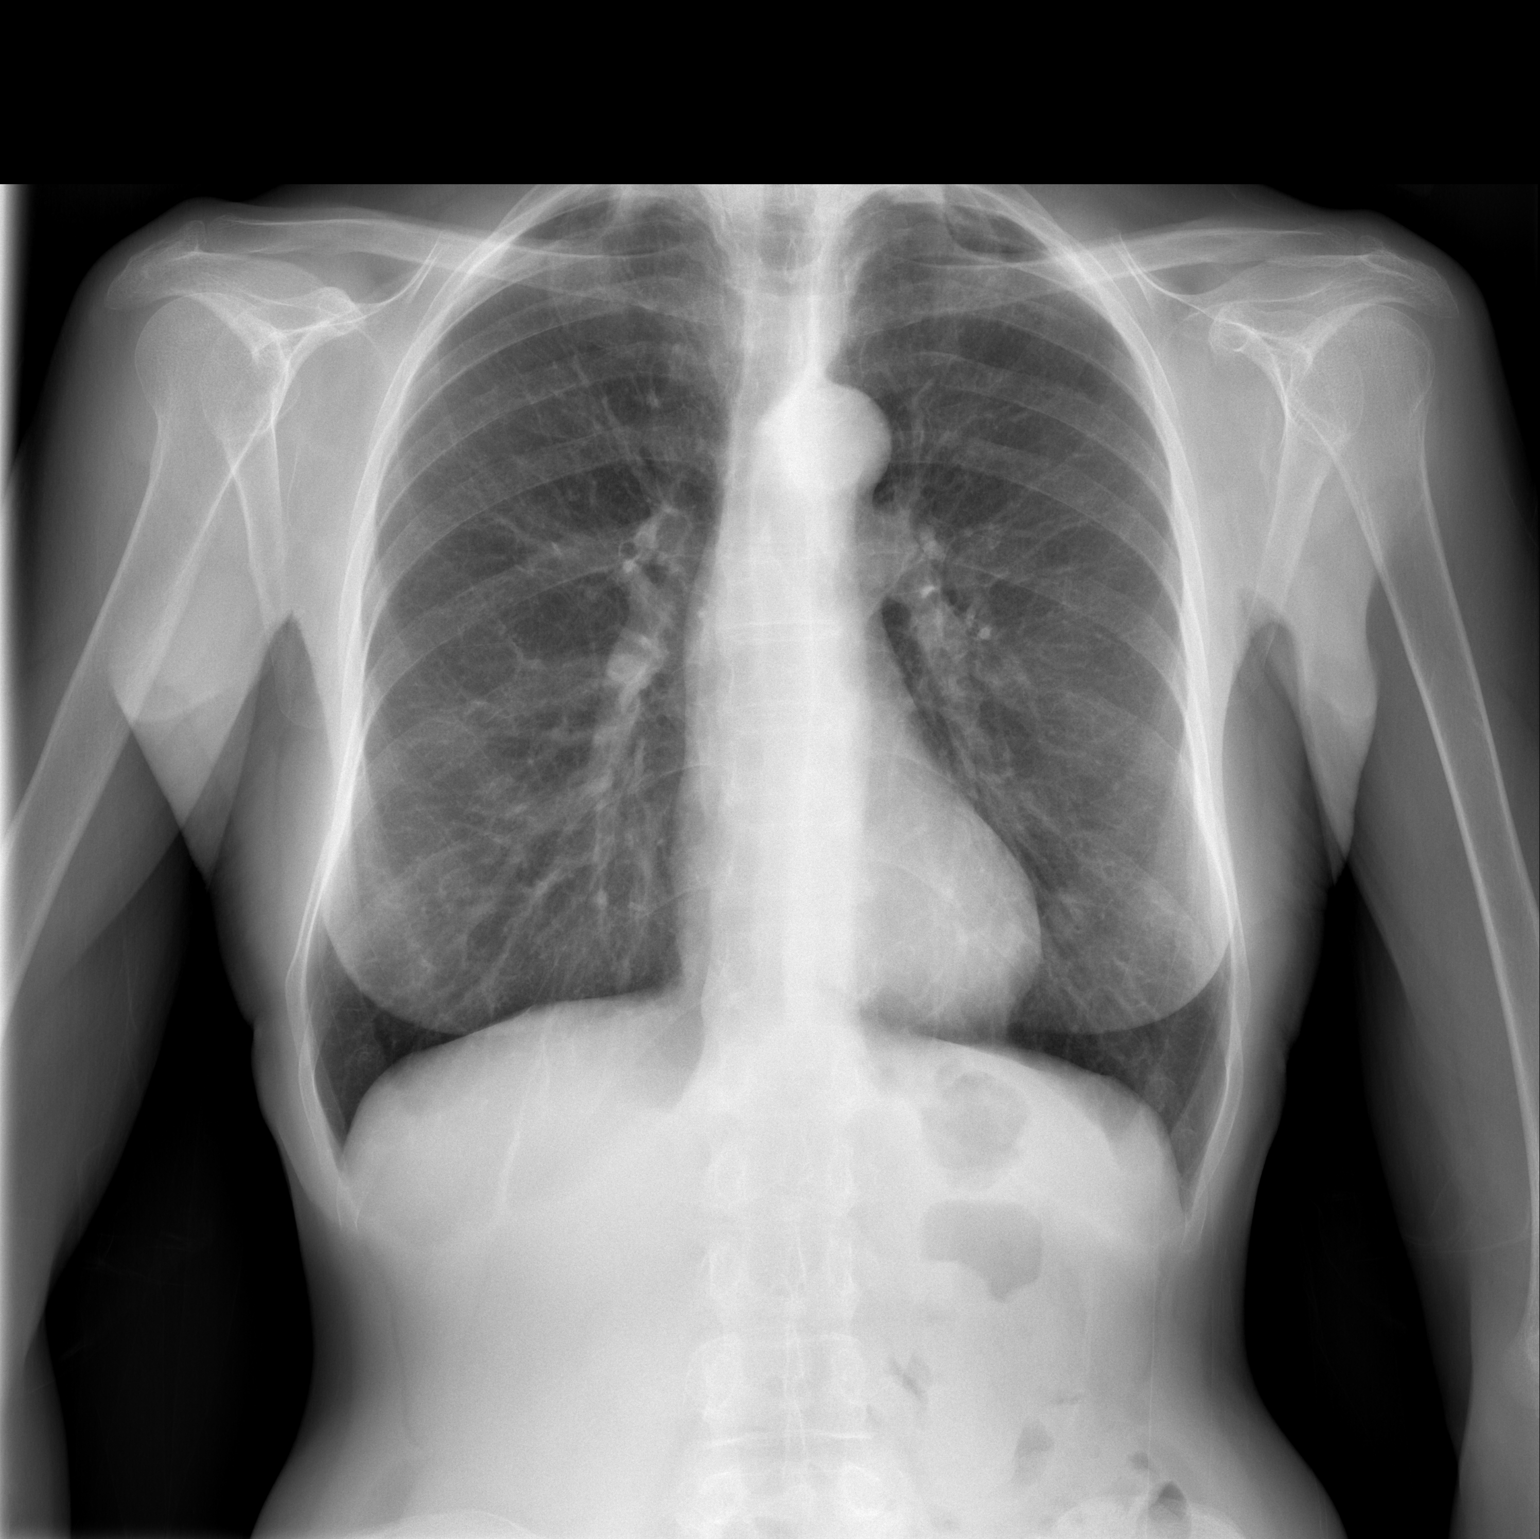

[w chest lat]
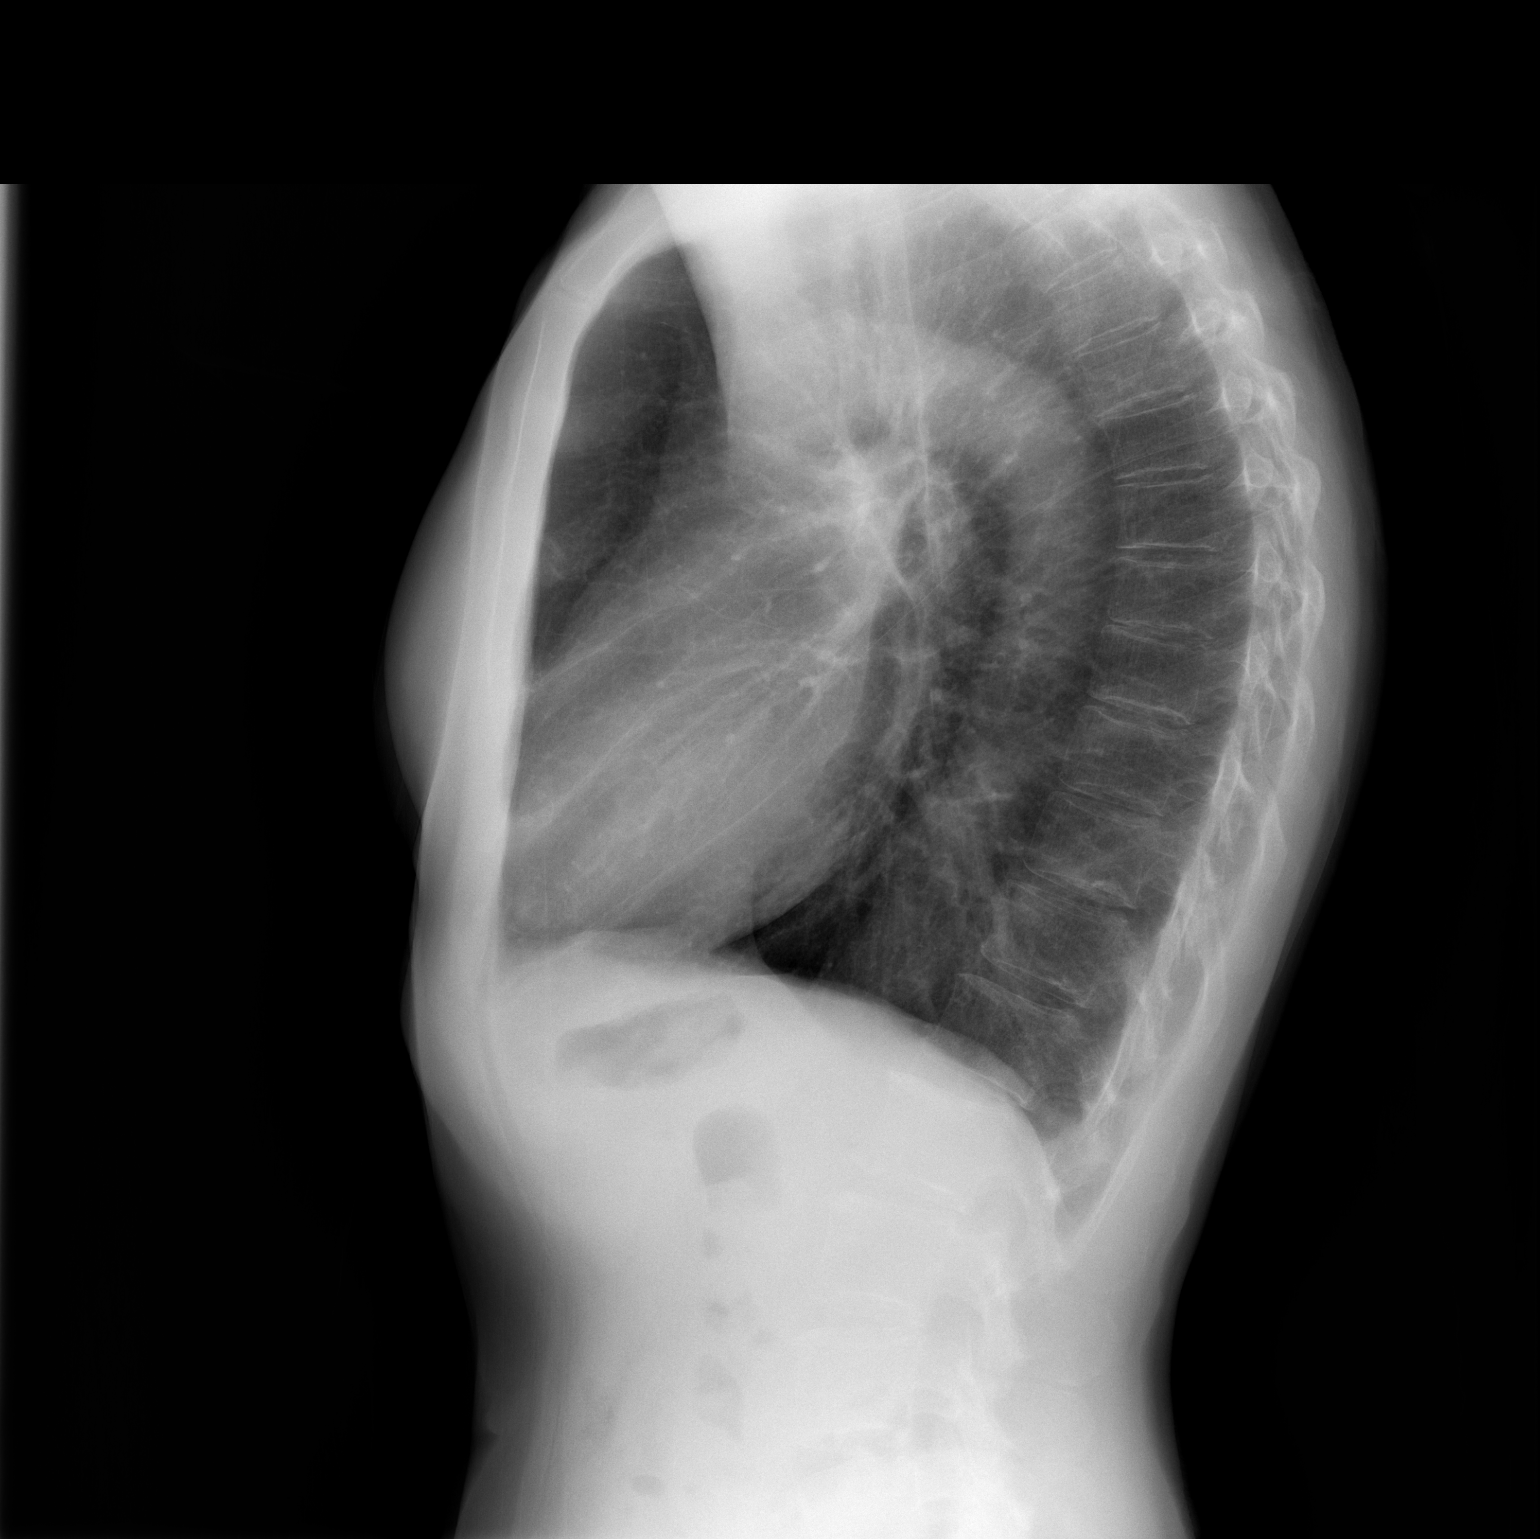

[2 of 2 positions shown; findings below may reference images not displayed]

FINDINGS: Lung volumes are increased with emphysematous changes no
consolidative airspace disease. No pleural effusions. No
pneumothorax. No pulmonary nodule or mass noted. Pulmonary
vasculature and the cardiomediastinal silhouette are within normal
limits.
IMPRESSION: 1. No radiographic evidence of acute cardiopulmonary disease.
2. Emphysema.

## 2022-06-13 ENCOUNTER — Ambulatory Visit
Admission: RE | Admit: 2022-06-13 | Discharge: 2022-06-13 | Disposition: A | Discharge status: Home / Self Care | Payer: BC Managed Care – PPO | Source: Ambulatory Visit | Attending: Geriatric Medicine | Admitting: Geriatric Medicine

## 2022-06-13 DIAGNOSIS — K862 Cyst of pancreas: Secondary | ICD-10-CM | POA: Diagnosis not present

## 2022-06-13 DIAGNOSIS — D7389 Other diseases of spleen: Secondary | ICD-10-CM | POA: Diagnosis not present

## 2022-06-13 DIAGNOSIS — Z85038 Personal history of other malignant neoplasm of large intestine: Secondary | ICD-10-CM | POA: Diagnosis not present

## 2022-06-13 DIAGNOSIS — R161 Splenomegaly, not elsewhere classified: Secondary | ICD-10-CM

## 2022-06-13 DIAGNOSIS — D734 Cyst of spleen: Secondary | ICD-10-CM | POA: Diagnosis not present

## 2022-06-13 MED ORDER — GADOBENATE DIMEGLUMINE 529 MG/ML IV SOLN
10.0000 mL | Freq: Once | INTRAVENOUS | Status: AC | PRN
Start: 2022-06-13 — End: 2022-06-13
  Administered 2022-06-13: 10 mL via INTRAVENOUS

## 2022-06-16 DIAGNOSIS — D124 Benign neoplasm of descending colon: Secondary | ICD-10-CM | POA: Diagnosis not present

## 2022-06-16 DIAGNOSIS — D125 Benign neoplasm of sigmoid colon: Secondary | ICD-10-CM | POA: Diagnosis not present

## 2022-06-16 DIAGNOSIS — Z980 Intestinal bypass and anastomosis status: Secondary | ICD-10-CM | POA: Diagnosis not present

## 2022-06-16 DIAGNOSIS — Z08 Encounter for follow-up examination after completed treatment for malignant neoplasm: Secondary | ICD-10-CM | POA: Diagnosis not present

## 2022-06-16 DIAGNOSIS — Z85038 Personal history of other malignant neoplasm of large intestine: Secondary | ICD-10-CM | POA: Diagnosis not present

## 2022-06-18 DIAGNOSIS — H2513 Age-related nuclear cataract, bilateral: Secondary | ICD-10-CM | POA: Diagnosis not present

## 2022-06-18 DIAGNOSIS — H04123 Dry eye syndrome of bilateral lacrimal glands: Secondary | ICD-10-CM | POA: Diagnosis not present

## 2022-12-23 DIAGNOSIS — I700 Atherosclerosis of aorta: Secondary | ICD-10-CM | POA: Diagnosis not present

## 2022-12-23 DIAGNOSIS — K2270 Barrett's esophagus without dysplasia: Secondary | ICD-10-CM | POA: Diagnosis not present

## 2022-12-23 DIAGNOSIS — K909 Intestinal malabsorption, unspecified: Secondary | ICD-10-CM | POA: Diagnosis not present

## 2022-12-23 DIAGNOSIS — Z0000 Encounter for general adult medical examination without abnormal findings: Secondary | ICD-10-CM | POA: Diagnosis not present

## 2022-12-23 DIAGNOSIS — K219 Gastro-esophageal reflux disease without esophagitis: Secondary | ICD-10-CM | POA: Diagnosis not present

## 2022-12-23 DIAGNOSIS — Z79899 Other long term (current) drug therapy: Secondary | ICD-10-CM | POA: Diagnosis not present

## 2022-12-29 ENCOUNTER — Other Ambulatory Visit: Payer: Self-pay | Admitting: Internal Medicine

## 2022-12-29 DIAGNOSIS — Z1231 Encounter for screening mammogram for malignant neoplasm of breast: Secondary | ICD-10-CM

## 2023-01-07 ENCOUNTER — Ambulatory Visit
Admission: RE | Admit: 2023-01-07 | Discharge: 2023-01-07 | Disposition: A | Discharge status: Home / Self Care | Payer: BC Managed Care – PPO | Source: Ambulatory Visit | Attending: Internal Medicine | Admitting: Internal Medicine

## 2023-01-07 DIAGNOSIS — Z1231 Encounter for screening mammogram for malignant neoplasm of breast: Secondary | ICD-10-CM | POA: Diagnosis not present

## 2023-01-29 DIAGNOSIS — B349 Viral infection, unspecified: Secondary | ICD-10-CM | POA: Diagnosis not present

## 2023-01-29 DIAGNOSIS — R110 Nausea: Secondary | ICD-10-CM | POA: Diagnosis not present

## 2023-01-29 DIAGNOSIS — Z03818 Encounter for observation for suspected exposure to other biological agents ruled out: Secondary | ICD-10-CM | POA: Diagnosis not present

## 2023-02-02 DIAGNOSIS — B349 Viral infection, unspecified: Secondary | ICD-10-CM | POA: Diagnosis not present

## 2023-02-03 ENCOUNTER — Ambulatory Visit
Admission: RE | Admit: 2023-02-03 | Discharge: 2023-02-03 | Disposition: A | Discharge status: Home / Self Care | Payer: BC Managed Care – PPO | Source: Ambulatory Visit | Attending: Internal Medicine | Admitting: Internal Medicine

## 2023-02-03 ENCOUNTER — Other Ambulatory Visit: Payer: Self-pay | Admitting: Internal Medicine

## 2023-02-03 DIAGNOSIS — R198 Other specified symptoms and signs involving the digestive system and abdomen: Secondary | ICD-10-CM

## 2023-02-03 DIAGNOSIS — Z85038 Personal history of other malignant neoplasm of large intestine: Secondary | ICD-10-CM

## 2023-02-03 DIAGNOSIS — R140 Abdominal distension (gaseous): Secondary | ICD-10-CM | POA: Diagnosis not present

## 2023-02-03 DIAGNOSIS — R6881 Early satiety: Secondary | ICD-10-CM | POA: Diagnosis not present

## 2023-02-03 DIAGNOSIS — R634 Abnormal weight loss: Secondary | ICD-10-CM | POA: Diagnosis not present

## 2023-02-03 MED ORDER — IOPAMIDOL (ISOVUE-300) INJECTION 61%
100.0000 mL | Freq: Once | INTRAVENOUS | Status: AC | PRN
  Administered 2023-02-03: 100 mL via INTRAVENOUS

## 2023-02-12 DIAGNOSIS — K2270 Barrett's esophagus without dysplasia: Secondary | ICD-10-CM | POA: Diagnosis not present

## 2023-02-12 DIAGNOSIS — K5900 Constipation, unspecified: Secondary | ICD-10-CM | POA: Diagnosis not present

## 2023-02-12 DIAGNOSIS — Z85038 Personal history of other malignant neoplasm of large intestine: Secondary | ICD-10-CM | POA: Diagnosis not present

## 2023-02-12 DIAGNOSIS — K219 Gastro-esophageal reflux disease without esophagitis: Secondary | ICD-10-CM | POA: Diagnosis not present

## 2023-02-12 DIAGNOSIS — D649 Anemia, unspecified: Secondary | ICD-10-CM | POA: Diagnosis not present

## 2023-02-15 DIAGNOSIS — D132 Benign neoplasm of duodenum: Secondary | ICD-10-CM | POA: Diagnosis not present

## 2023-02-15 DIAGNOSIS — K219 Gastro-esophageal reflux disease without esophagitis: Secondary | ICD-10-CM | POA: Diagnosis not present

## 2023-02-15 DIAGNOSIS — K317 Polyp of stomach and duodenum: Secondary | ICD-10-CM | POA: Diagnosis not present

## 2023-02-15 DIAGNOSIS — K2289 Other specified disease of esophagus: Secondary | ICD-10-CM | POA: Diagnosis not present

## 2023-02-15 DIAGNOSIS — K2270 Barrett's esophagus without dysplasia: Secondary | ICD-10-CM | POA: Diagnosis not present

## 2023-02-15 DIAGNOSIS — R1013 Epigastric pain: Secondary | ICD-10-CM | POA: Diagnosis not present

## 2023-02-15 DIAGNOSIS — K3189 Other diseases of stomach and duodenum: Secondary | ICD-10-CM | POA: Diagnosis not present

## 2023-02-15 DIAGNOSIS — K2950 Unspecified chronic gastritis without bleeding: Secondary | ICD-10-CM | POA: Diagnosis not present

## 2023-02-15 DIAGNOSIS — D509 Iron deficiency anemia, unspecified: Secondary | ICD-10-CM | POA: Diagnosis not present

## 2023-03-15 DIAGNOSIS — D132 Benign neoplasm of duodenum: Secondary | ICD-10-CM | POA: Diagnosis not present

## 2023-04-21 DIAGNOSIS — K317 Polyp of stomach and duodenum: Secondary | ICD-10-CM | POA: Diagnosis not present

## 2023-04-21 DIAGNOSIS — F17200 Nicotine dependence, unspecified, uncomplicated: Secondary | ICD-10-CM | POA: Diagnosis not present

## 2023-04-21 DIAGNOSIS — K449 Diaphragmatic hernia without obstruction or gangrene: Secondary | ICD-10-CM | POA: Diagnosis not present

## 2023-04-21 DIAGNOSIS — Z7983 Long term (current) use of bisphosphonates: Secondary | ICD-10-CM | POA: Diagnosis not present

## 2023-04-21 DIAGNOSIS — Z85038 Personal history of other malignant neoplasm of large intestine: Secondary | ICD-10-CM | POA: Diagnosis not present

## 2023-04-21 DIAGNOSIS — D132 Benign neoplasm of duodenum: Secondary | ICD-10-CM | POA: Diagnosis not present

## 2023-04-21 DIAGNOSIS — Z79899 Other long term (current) drug therapy: Secondary | ICD-10-CM | POA: Diagnosis not present

## 2023-04-21 DIAGNOSIS — E785 Hyperlipidemia, unspecified: Secondary | ICD-10-CM | POA: Diagnosis not present

## 2023-05-18 ENCOUNTER — Other Ambulatory Visit: Payer: Self-pay | Admitting: Internal Medicine

## 2023-05-18 DIAGNOSIS — D7389 Other diseases of spleen: Secondary | ICD-10-CM

## 2023-05-18 DIAGNOSIS — K869 Disease of pancreas, unspecified: Secondary | ICD-10-CM

## 2023-06-13 ENCOUNTER — Other Ambulatory Visit: Payer: BC Managed Care – PPO

## 2023-06-13 ENCOUNTER — Ambulatory Visit
Admission: RE | Admit: 2023-06-13 | Discharge: 2023-06-13 | Disposition: A | Discharge status: Home / Self Care | Payer: BC Managed Care – PPO | Source: Ambulatory Visit | Attending: Internal Medicine | Admitting: Internal Medicine

## 2023-06-13 DIAGNOSIS — K862 Cyst of pancreas: Secondary | ICD-10-CM | POA: Diagnosis not present

## 2023-06-13 DIAGNOSIS — D734 Cyst of spleen: Secondary | ICD-10-CM | POA: Diagnosis not present

## 2023-06-13 DIAGNOSIS — K869 Disease of pancreas, unspecified: Secondary | ICD-10-CM

## 2023-06-13 DIAGNOSIS — D7389 Other diseases of spleen: Secondary | ICD-10-CM

## 2023-06-13 MED ORDER — GADOPICLENOL 0.5 MMOL/ML IV SOLN
5.0000 mL | Freq: Once | INTRAVENOUS | Status: AC | PRN
  Administered 2023-06-13: 5 mL via INTRAVENOUS

## 2023-07-23 IMAGING — MG MM DIGITAL SCREENING BILAT W/ TOMO AND CAD
8 series · 9 of 24 positions shown · non-contrast
Comparison: None.

CLINICAL DATA: Screening.

EXAM:
DIGITAL SCREENING BILATERAL MAMMOGRAM WITH TOMOSYNTHESIS AND CAD
TECHNIQUE: Bilateral screening digital craniocaudal and mediolateral oblique
mammograms were obtained. Bilateral screening digital breast
tomosynthesis was performed. The images were evaluated with
computer-aided detection.

[R MLO synth-2D]
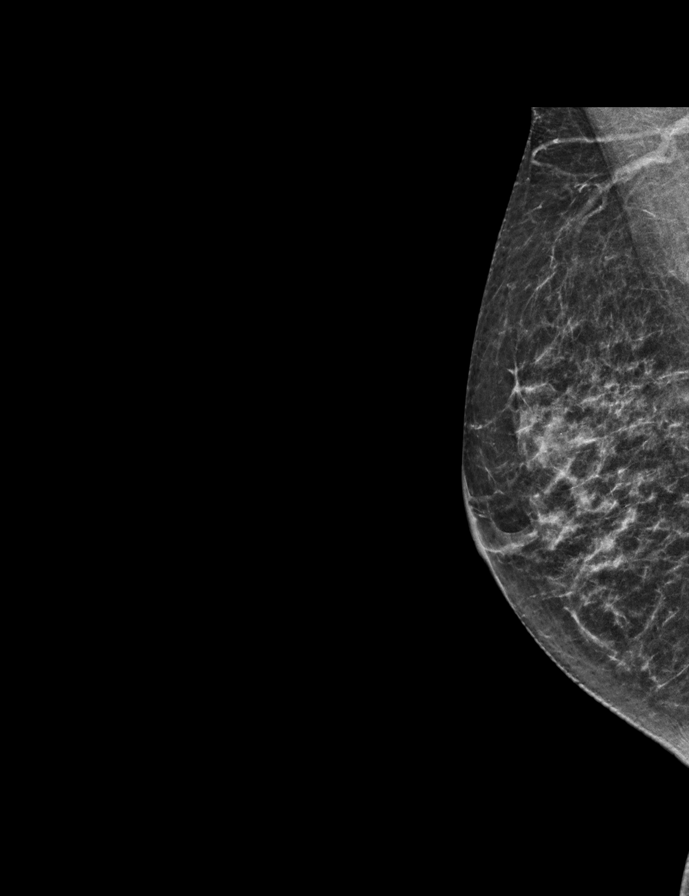

[L CC synth-2D]
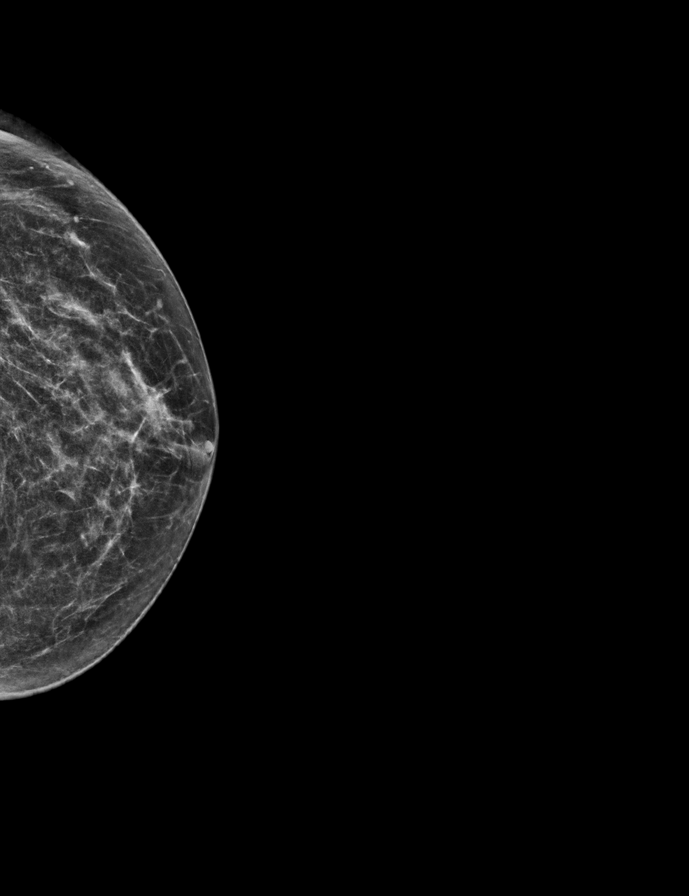

[R CC synth-2D]
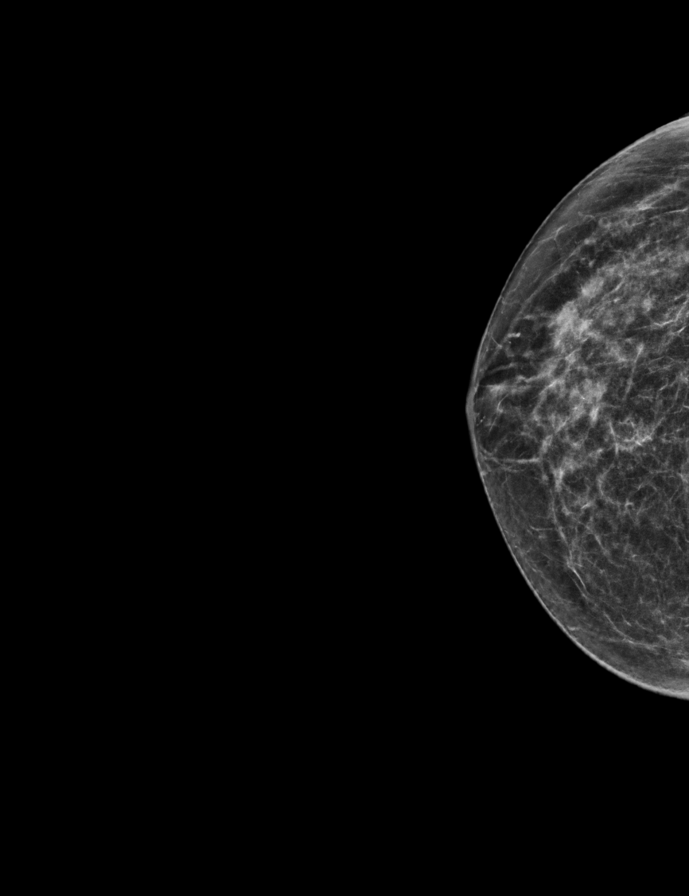

[L MLO synth-2D]
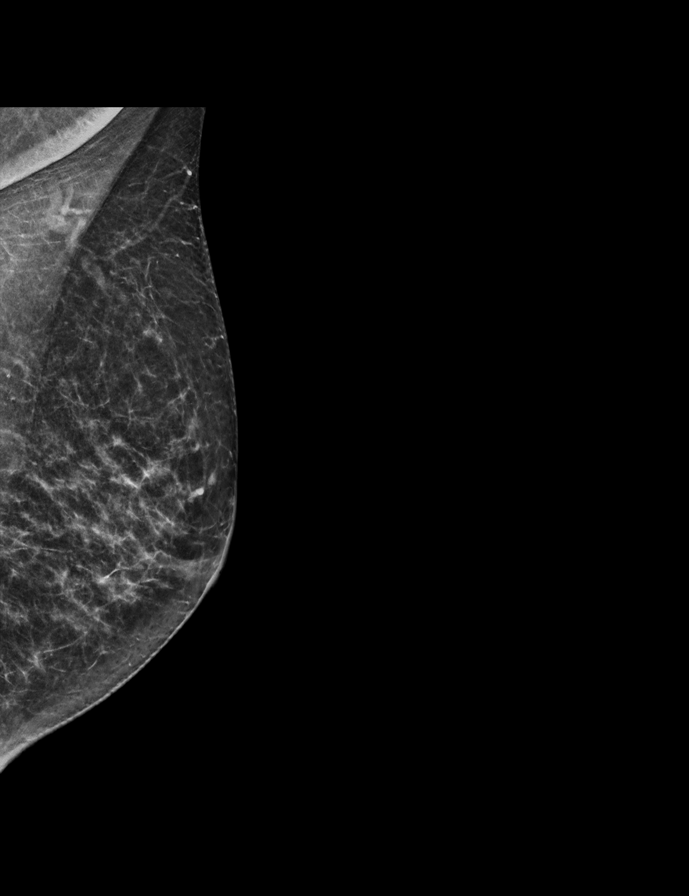

[L MLO tomo · 2 of 56 frames shown]
[frame 19/56]
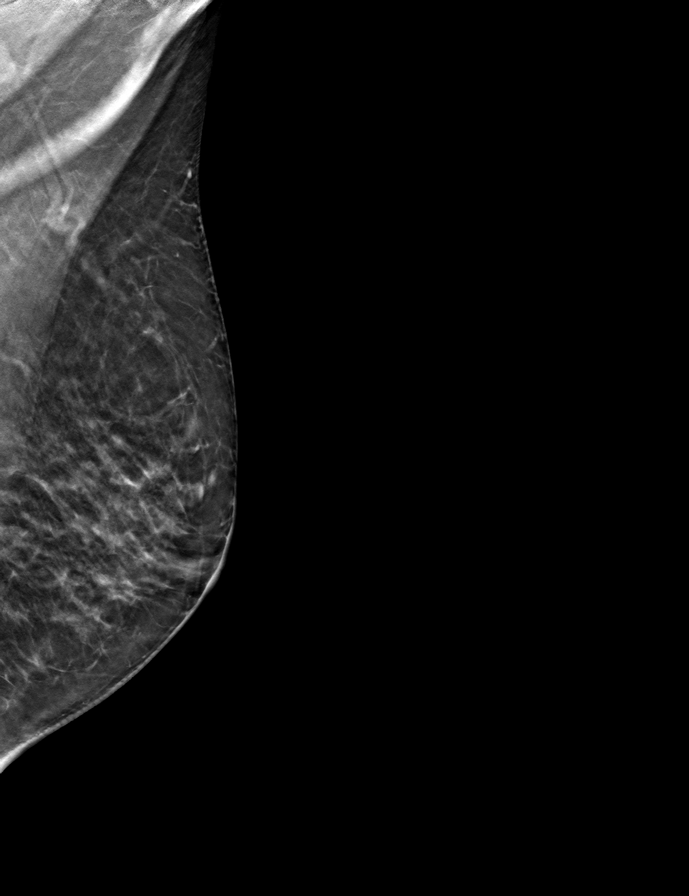
[frame 29/56]
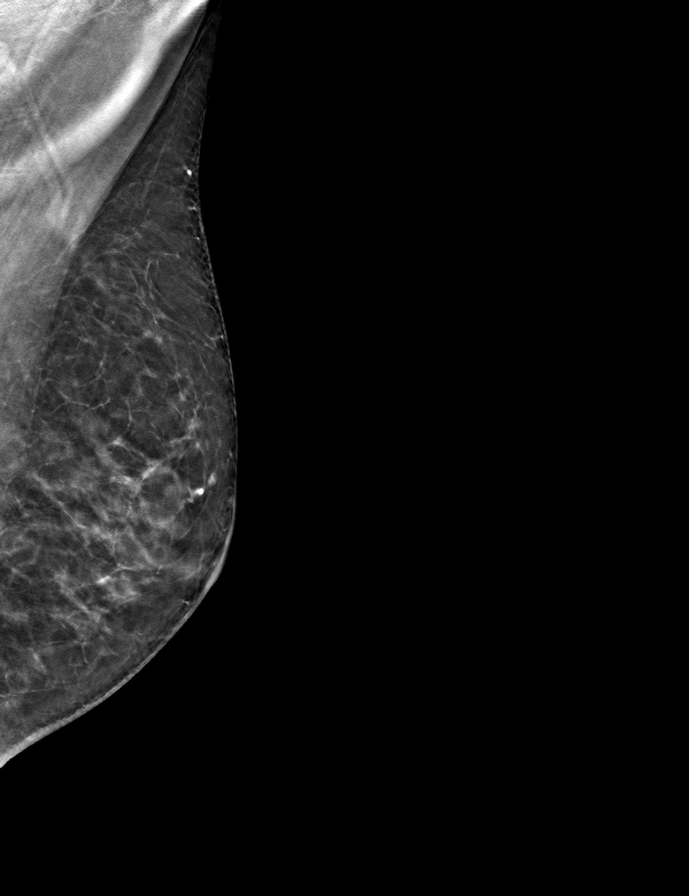

[L CC tomo · tomo slice 29/58.0]
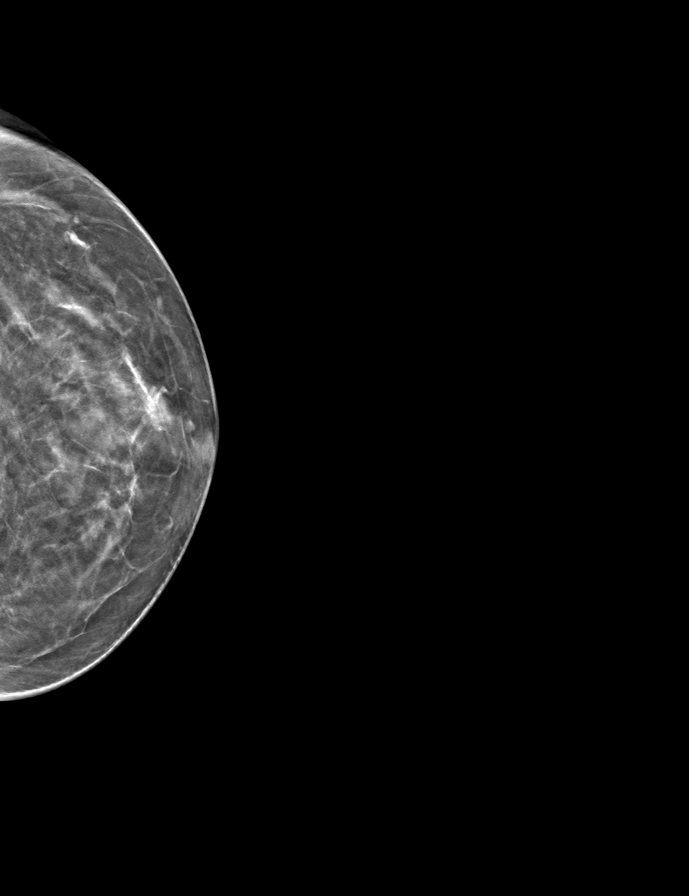

[R CC tomo · tomo slice 29/56.0]
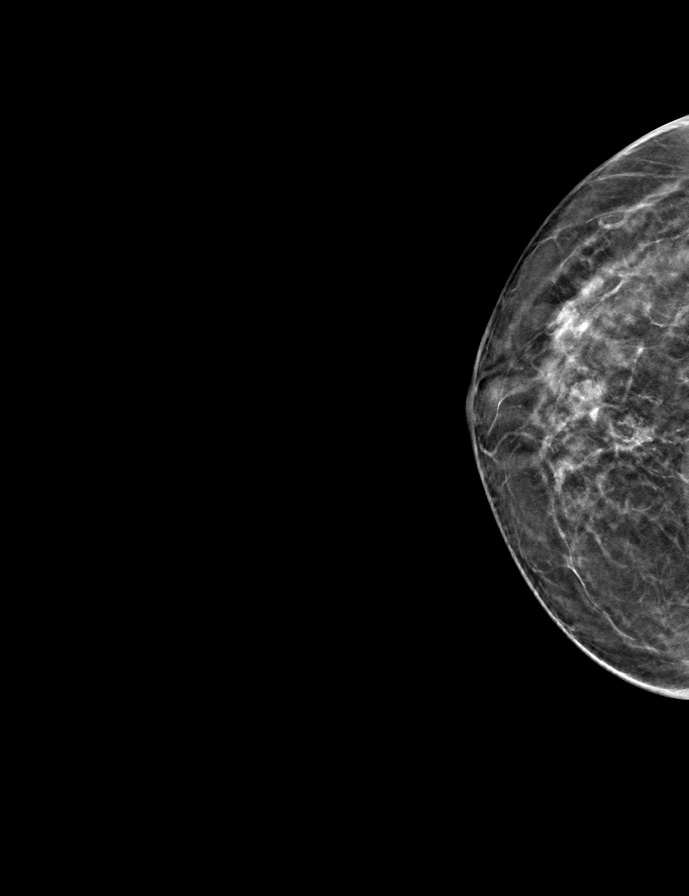

[R MLO tomo · tomo slice 26/51.0]
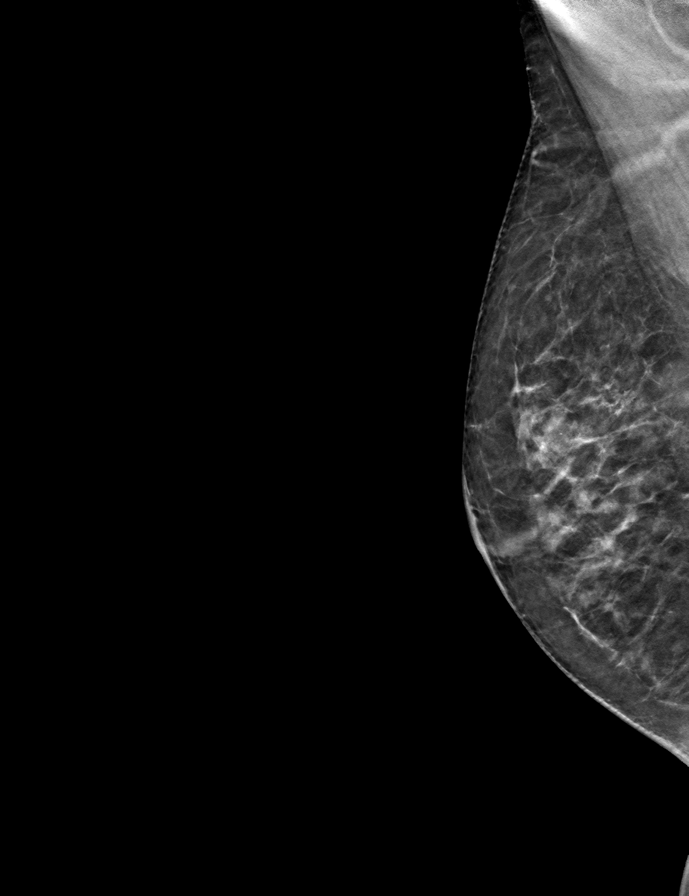

[9 of 24 positions shown; findings below may reference images not displayed]

ACR Breast Density Category c: The breast tissue is heterogeneously
dense, which may obscure small masses
FINDINGS: There are no findings suspicious for malignancy.
IMPRESSION: No mammographic evidence of malignancy. A result letter of this
screening mammogram will be mailed directly to the patient.

RECOMMENDATION:
Screening mammogram in one year. (Code:C8-T-HNK)

BI-RADS CATEGORY  1: Negative.

## 2023-12-01 DIAGNOSIS — K5909 Other constipation: Secondary | ICD-10-CM | POA: Diagnosis not present

## 2023-12-01 DIAGNOSIS — M5416 Radiculopathy, lumbar region: Secondary | ICD-10-CM | POA: Diagnosis not present

## 2023-12-02 DIAGNOSIS — M5416 Radiculopathy, lumbar region: Secondary | ICD-10-CM | POA: Diagnosis not present

## 2023-12-15 ENCOUNTER — Ambulatory Visit (INDEPENDENT_AMBULATORY_CARE_PROVIDER_SITE_OTHER): Admitting: Physician Assistant

## 2023-12-15 ENCOUNTER — Encounter: Payer: Self-pay | Admitting: Physician Assistant

## 2023-12-15 ENCOUNTER — Other Ambulatory Visit (INDEPENDENT_AMBULATORY_CARE_PROVIDER_SITE_OTHER): Payer: Self-pay

## 2023-12-15 DIAGNOSIS — M5442 Lumbago with sciatica, left side: Secondary | ICD-10-CM

## 2023-12-15 DIAGNOSIS — M5441 Lumbago with sciatica, right side: Secondary | ICD-10-CM

## 2023-12-15 DIAGNOSIS — G8929 Other chronic pain: Secondary | ICD-10-CM | POA: Diagnosis not present

## 2023-12-15 DIAGNOSIS — M25551 Pain in right hip: Secondary | ICD-10-CM

## 2023-12-15 NOTE — Progress Notes (Signed)
 Office Visit Note   Patient: Regina Young           Date of Birth: [birth year]           MRN: [id number] Visit Date: 12/15/2023              Requested by: Thana Ates, MD [address] [place],  [place] [address] PCP: Merlene Laughter, MD (Inactive)   Assessment & Plan: Visit Diagnoses:  1. Chronic bilateral low back pain with sciatica, sciatica laterality unspecified   2. Pain in right hip     Plan: Regina Young is a very pleasant 68 year old woman with a history of right buttock pain which causes aching in her legs.  Also complaining of some right groin pain.  Hurts when she flexes her hip.  She has had no injury she has been using Tylenol.  This got acute and she saw her primary care provider who did place her on a Medrol Dosepak.  Following that she has been on Aleve once or twice a day and she is feeling better.  Findings are consistent with some hip strain early arthritis as well as low back issues.  She is feeling a little bit better I recommended physical therapy.  She may follow-up as needed neck step would be to delineate after therapy which is still a prominent issue  Follow-Up Instructions: Return if symptoms worsen or fail to improve.   Orders:  Orders Placed This Encounter  Procedures   XR Lumbar Spine 2-3 Views   XR Pelvis 1-2 Views   Ambulatory referral to Physical Therapy   No orders of the defined types were placed in this encounter.     Procedures: No procedures performed   Clinical Data: No additional findings.   Subjective: No chief complaint on file.   HPI Patient is a pleasant 68 year old woman with a chief complaint of right hip pain.  She said this has been going on since February.  He said it started in her outer thigh and her posterior buttock and would go down her thigh.  Also into her groin denies any particular injury she does have a history of osteoporosis for which she is on Fosamax  Review of Systems  All other systems  reviewed and are negative.    Objective: Vital Signs: There were no vitals taken for this visit.  Physical Exam Constitutional:      Appearance: Normal appearance.  Skin:    General: Skin is warm and dry.  Neurological:     General: No focal deficit present.     Mental Status: She is alert and oriented to person, place, and time.  Psychiatric:        Mood and Affect: Mood normal.        Behavior: Behavior normal.     Ortho Exam Examination of her right hip she has no pain with logrolling and has excellent motion of her hip.  She does have some pain anteriorly the quadricep into the hip with flexion of her hip.  She has some pain in the right lower back increased with extension of her back.  She feels mild stretching with right straight leg raise she has good strength with dorsiflexion plantarflexion extension and flexion of her legs Specialty Comments:  No specialty comments available.  Imaging: XR Pelvis 1-2 Views Result Date: 12/15/2023 AP pelvis some degenerative changes of the hips by back laterally with some sclerotic changes in the superior acetabulum no acute fractures  XR  Lumbar Spine 2-3 Views Result Date: 12/15/2023 Radiographs of the lumbar spine demonstrate overall well-maintained alignment some endplate sclerotic changes and some facet arthropathy no acute fractures degenerative changes noted mostly at L5-S1    PMFS History: Patient Active Problem List   Diagnosis Date Noted   Rectal mass 02/21/2021   Past Medical History:  Diagnosis Date   Hyperlipidemia     History reviewed. No pertinent family history.  Past Surgical History:  Procedure Laterality Date   ABDOMINAL HYSTERECTOMY     BIOPSY  01/01/2021   Procedure: BIOPSY;  Surgeon: Romie Levee, MD;  Location: [facility];  Service: Endoscopy;;   FLEXIBLE SIGMOIDOSCOPY N/A 01/01/2021   Procedure: FLEXIBLE SIGMOIDOSCOPY WITH BIOPSY;  Surgeon: Romie Levee, MD;  Location: [facility];  Service:  Endoscopy;  Laterality: N/A;  IV SEDATION BY SURGEON   XI ROBOTIC ASSISTED LOWER ANTERIOR RESECTION N/A 02/21/2021   Procedure: XI ROBOTIC ASSISTED LOWER ANTERIOR RESECTION;  Surgeon: Romie Levee, MD;  Location: [facility];  Service: General;  Laterality: N/A;   Social History   Occupational History   Not on file  Tobacco Use   Smoking status: Every Day    Current packs/day: 0.50    Average packs/day: 0.5 packs/day for 25.0 years (12.5 ttl pk-yrs)    Types: Cigarettes   Smokeless tobacco: Never  Vaping Use   Vaping status: Never Used  Substance and Sexual Activity   Alcohol use: Yes    Comment: occas.   Drug use: Never   Sexual activity: Not on file

## 2023-12-21 ENCOUNTER — Telehealth: Payer: Self-pay

## 2023-12-21 ENCOUNTER — Telehealth: Payer: Self-pay | Admitting: Physician Assistant

## 2023-12-21 ENCOUNTER — Other Ambulatory Visit: Payer: Self-pay | Admitting: Physician Assistant

## 2023-12-21 MED ORDER — METHOCARBAMOL 500 MG PO TABS: 500.0000 mg | ORAL_TABLET | Freq: Three times a day (TID) | ORAL | 0 refills | Status: DC | PRN

## 2023-12-21 NOTE — Telephone Encounter (Signed)
 Called patient and let her know that Norma Beckers said she will call in a mild muscle relaxant and the patient said that would be great to the [facility] on [address]

## 2023-12-21 NOTE — Telephone Encounter (Signed)
 Patient called and said at night she isn't able to sleep. Its a lot of aches. She wanted to know about something you mention that was stronger than the aleve. CB#[phone]

## 2023-12-22 ENCOUNTER — Emergency Department (HOSPITAL_BASED_OUTPATIENT_CLINIC_OR_DEPARTMENT_OTHER)

## 2023-12-22 ENCOUNTER — Encounter (HOSPITAL_BASED_OUTPATIENT_CLINIC_OR_DEPARTMENT_OTHER): Payer: Self-pay | Admitting: Emergency Medicine

## 2023-12-22 ENCOUNTER — Other Ambulatory Visit: Payer: Self-pay

## 2023-12-22 ENCOUNTER — Observation Stay (HOSPITAL_BASED_OUTPATIENT_CLINIC_OR_DEPARTMENT_OTHER)
Admission: EM | Admit: 2023-12-22 | Discharge: 2023-12-26 | Disposition: A | Discharge status: Home / Self Care | Attending: Family Medicine | Admitting: Family Medicine

## 2023-12-22 ENCOUNTER — Emergency Department (HOSPITAL_COMMUNITY)
Admission: EM | Admit: 2023-12-22 | Discharge: 2023-12-22 | Discharge status: Against Medical Advice | Source: Home / Self Care

## 2023-12-22 ENCOUNTER — Encounter (HOSPITAL_COMMUNITY): Payer: Self-pay

## 2023-12-22 DIAGNOSIS — C189 Malignant neoplasm of colon, unspecified: Secondary | ICD-10-CM | POA: Diagnosis not present

## 2023-12-22 DIAGNOSIS — K689 Other disorders of retroperitoneum: Secondary | ICD-10-CM | POA: Diagnosis not present

## 2023-12-22 DIAGNOSIS — M79669 Pain in unspecified lower leg: Secondary | ICD-10-CM | POA: Diagnosis not present

## 2023-12-22 DIAGNOSIS — Z79899 Other long term (current) drug therapy: Secondary | ICD-10-CM | POA: Insufficient documentation

## 2023-12-22 DIAGNOSIS — F17210 Nicotine dependence, cigarettes, uncomplicated: Secondary | ICD-10-CM | POA: Insufficient documentation

## 2023-12-22 DIAGNOSIS — M8450XA Pathological fracture in neoplastic disease, unspecified site, initial encounter for fracture: Secondary | ICD-10-CM

## 2023-12-22 DIAGNOSIS — C661 Malignant neoplasm of right ureter: Secondary | ICD-10-CM | POA: Diagnosis not present

## 2023-12-22 DIAGNOSIS — N1330 Unspecified hydronephrosis: Secondary | ICD-10-CM | POA: Diagnosis not present

## 2023-12-22 DIAGNOSIS — M79604 Pain in right leg: Secondary | ICD-10-CM | POA: Insufficient documentation

## 2023-12-22 DIAGNOSIS — R55 Syncope and collapse: Secondary | ICD-10-CM | POA: Insufficient documentation

## 2023-12-22 DIAGNOSIS — Z90710 Acquired absence of both cervix and uterus: Secondary | ICD-10-CM | POA: Diagnosis present

## 2023-12-22 DIAGNOSIS — C7951 Secondary malignant neoplasm of bone: Secondary | ICD-10-CM

## 2023-12-22 DIAGNOSIS — Z5321 Procedure and treatment not carried out due to patient leaving prior to being seen by health care provider: Secondary | ICD-10-CM | POA: Insufficient documentation

## 2023-12-22 DIAGNOSIS — R930 Abnormal findings on diagnostic imaging of skull and head, not elsewhere classified: Secondary | ICD-10-CM | POA: Diagnosis not present

## 2023-12-22 DIAGNOSIS — M79662 Pain in left lower leg: Secondary | ICD-10-CM | POA: Diagnosis not present

## 2023-12-22 DIAGNOSIS — R29890 Loss of height: Secondary | ICD-10-CM | POA: Diagnosis not present

## 2023-12-22 DIAGNOSIS — M545 Low back pain, unspecified: Secondary | ICD-10-CM | POA: Diagnosis not present

## 2023-12-22 DIAGNOSIS — S0990XA Unspecified injury of head, initial encounter: Secondary | ICD-10-CM | POA: Diagnosis not present

## 2023-12-22 DIAGNOSIS — C187 Malignant neoplasm of sigmoid colon: Secondary | ICD-10-CM | POA: Diagnosis present

## 2023-12-22 DIAGNOSIS — R1031 Right lower quadrant pain: Secondary | ICD-10-CM | POA: Diagnosis not present

## 2023-12-22 DIAGNOSIS — N134 Hydroureter: Secondary | ICD-10-CM | POA: Diagnosis not present

## 2023-12-22 DIAGNOSIS — E785 Hyperlipidemia, unspecified: Secondary | ICD-10-CM | POA: Diagnosis present

## 2023-12-22 DIAGNOSIS — R1900 Intra-abdominal and pelvic swelling, mass and lump, unspecified site: Secondary | ICD-10-CM | POA: Diagnosis present

## 2023-12-22 LAB — CBC WITH DIFFERENTIAL/PLATELET
Abs Immature Granulocytes: 0.04 10*3/uL (ref 0.00–0.07)
Basophils Absolute: 0 10*3/uL (ref 0.0–0.1)
Basophils Relative: 0 %
Eosinophils Absolute: 0 10*3/uL (ref 0.0–0.5)
Eosinophils Relative: 0 %
HCT: 40.2 % (ref 36.0–46.0)
Hemoglobin: 13 g/dL (ref 12.0–15.0)
Immature Granulocytes: 0 %
Lymphocytes Relative: 12 %
Lymphs Abs: 1.3 10*3/uL (ref 0.7–4.0)
MCH: 30.1 pg (ref 26.0–34.0)
MCHC: 32.3 g/dL (ref 30.0–36.0)
MCV: 93.1 fL (ref 80.0–100.0)
Monocytes Absolute: 0.4 10*3/uL (ref 0.1–1.0)
Monocytes Relative: 4 %
Neutro Abs: 8.7 10*3/uL — ABNORMAL HIGH (ref 1.7–7.7)
Neutrophils Relative %: 84 %
Platelets: 335 10*3/uL (ref 150–400)
RBC: 4.32 MIL/uL (ref 3.87–5.11)
RDW: 13.2 % (ref 11.5–15.5)
WBC: 10.5 10*3/uL (ref 4.0–10.5)
nRBC: 0 % (ref 0.0–0.2)

## 2023-12-22 LAB — TROPONIN I (HIGH SENSITIVITY): Troponin I (High Sensitivity): 6 ng/L (ref <18)

## 2023-12-22 LAB — BASIC METABOLIC PANEL WITH GFR
Anion gap: 11 (ref 5–15)
BUN: 14 mg/dL (ref 8–23)
CO2: 23 mmol/L (ref 22–32)
Calcium: 9 mg/dL (ref 8.9–10.3)
Chloride: 103 mmol/L (ref 98–111)
Creatinine, Ser: 0.92 mg/dL (ref 0.44–1.00)
GFR, Estimated: >60 mL/min (ref >60)
Glucose, Bld: 119 mg/dL — ABNORMAL HIGH (ref 70–99)
Potassium: 3.7 mmol/L (ref 3.5–5.1)
Sodium: 137 mmol/L (ref 135–145)

## 2023-12-22 LAB — CBG MONITORING, ED: Glucose-Capillary: 125 mg/dL — ABNORMAL HIGH (ref 70–99)

## 2023-12-22 MED ORDER — IOHEXOL 300 MG/ML  SOLN
30.0000 mL | Freq: Once | INTRAMUSCULAR | Status: AC | PRN
  Administered 2023-12-22: 30 mL via ORAL

## 2023-12-22 MED ORDER — HYDROCODONE-ACETAMINOPHEN 5-325 MG PO TABS
1.0000 | ORAL_TABLET | Freq: Once | ORAL | Status: AC
  Administered 2023-12-22: 1 via ORAL
  Filled 2023-12-22: qty 1

## 2023-12-22 MED ORDER — IOHEXOL 300 MG/ML  SOLN
96.0000 mL | Freq: Once | INTRAMUSCULAR | Status: AC | PRN
  Administered 2023-12-22: 96 mL via INTRAVENOUS

## 2023-12-22 NOTE — ED Notes (Signed)
 Pt stated she was going to try and get in at urgent care or her primary doctor office. She was seen leaving he ED

## 2023-12-22 NOTE — ED Notes (Signed)
Pt. Walked to Restroom with no difficulty.

## 2023-12-22 NOTE — ED Notes (Addendum)
 Pt wanted to stand on side of bed stating she is in so much pain and cannot get comfortable on bed, so I assisted her

## 2023-12-22 NOTE — ED Provider Notes (Signed)
 Regina Young Provider Note   CSN: [id number] Arrival date & time: 12/22/23  1401     History  Chief Complaint  Patient presents with   Leg Pain    right    Regina Young is a 68 y.o. female.  Patient complains of severe pain in her right leg and right low back area.  Patient reports that she has had the pain for approximately 4 weeks.  Patient states that she has had increasing pain for the last 4 days.  Patient states she has unable to stay still.  She has been unable to get comfortable and sleep.  Patient states that this a.m. she passed out.  Patient did strike her head.  Her husband reports that patient had a loss of consciousness for approximately 5 minutes.  He reports patient had 2 bowel movements afterwards.  Patient complains of severe pain today.  Patient reports that she has pain in the right lower quadrant of her abdomen as well as her right buttock down her right leg.  Patient has not had any urinary symptoms.  She denies any loss of bowel or bladder control.  Patient denies any fever or chills.  She denies headache.  Patient has a past medical history of colon cancer that was resected in 2022.  Patient has had normal colonoscopies since.  Patient reports that she saw orthopedics a week ago.  They are planning to try physical therapy.  Patient was given a prescription for Robaxin  yesterday.  She states she took 1 last night and Aleve.  Patient reports she went to [facility] emergency department this morning.  She had labs and an EKG.  Patient states she was told the EKG was normal but they left there before being seen by provider due to patient's pain.  The history is provided by the patient. No language interpreter was used.  Leg Pain      Home Medications Prior to Admission medications   Medication Sig Start Date End Date Taking? Authorizing Provider  methocarbamol  (ROBAXIN ) 500 MG tablet Take 1 tablet (500 mg total) by mouth every 8  (eight) hours as needed for muscle spasms. 12/21/23   Persons, Norma Beckers, PA  acetaminophen  (TYLENOL ) 500 MG tablet Take 500-1,000 mg by mouth every 6 (six) hours as needed (PAIN).    [provider]  ibuprofen (ADVIL) 200 MG tablet Take 200-400 mg by mouth every 8 (eight) hours as needed (for pain.).    [provider]  naproxen sodium (ALEVE) 220 MG tablet Take 220 mg by mouth 2 (two) times daily as needed (pain).    [provider]  rosuvastatin  (CRESTOR ) 5 MG tablet Take 5 mg by mouth in the morning. (0800) 11/24/20   [provider]  traMADol  (ULTRAM ) 50 MG tablet Take 1 tablet (50 mg total) by mouth every 6 (six) hours as needed for moderate pain. 02/23/21   Sim Dryer, MD      Allergies    Patient has no known allergies.    Review of Systems   Review of Systems  Gastrointestinal:  Positive for abdominal pain.  Genitourinary:  Positive for flank pain.  All other systems reviewed and are negative.   Physical Exam Updated Vital Signs BP (!) 157/81   Pulse 81   Temp 98.1 F (36.7 C) (Oral)   Resp 15   SpO2 95%  Physical Exam Vitals reviewed.  Constitutional:      Appearance: She is ill-appearing.  Comments: Uncomfortable appearing  HENT:     Head: Normocephalic.  Eyes:     Pupils: Pupils are equal, round, and reactive to light.  Cardiovascular:     Rate and Rhythm: Normal rate.  Pulmonary:     Effort: Pulmonary effort is normal.  Abdominal:     General: Abdomen is flat.     Palpations: Abdomen is soft.     Comments: Tender right lower quadrant.  Musculoskeletal:        General: No swelling or tenderness.     Comments: Tender right flank area   Neurological:     General: No focal deficit present.     Mental Status: She is alert.  Psychiatric:        Mood and Affect: Mood normal.     ED Results / Procedures / Treatments   Labs (all labs ordered are listed, but only abnormal results are displayed) Labs Reviewed   TROPONIN I (HIGH SENSITIVITY)  TROPONIN I (HIGH SENSITIVITY)    EKG EKG Interpretation Date/Time:  Wednesday December 22 2023 17:25:53 EDT Ventricular Rate:  91 PR Interval:  187 QRS Duration:  92 QT Interval:  407 QTC Calculation: 501 R Axis:   78  Text Interpretation: Sinus rhythm Biatrial enlargement Prolonged QT interval Confirmed by Owen Blowers ([id number]) on 12/22/2023 10:21:03 PM  Radiology CT L-SPINE NO CHARGE Result Date: 12/22/2023 CLINICAL DATA:  Recurrent right leg pain, right thigh pain, right calf pain. History of colon cancer. * Tracking Code: BO * EXAM: CT LUMBAR SPINE WITHOUT CONTRAST TECHNIQUE: Multidetector CT imaging of the lumbar spine was performed without intravenous contrast administration. Multiplanar CT image reconstructions were also generated. RADIATION DOSE REDUCTION: This exam was performed according to the departmental dose-optimization program which includes automated exposure control, adjustment of the mA and/or kV according to patient size and/or use of iterative reconstruction technique. COMPARISON:  Findings are correlated with concurrently performed CT examination the abdomen and pelvis. FINDINGS: Segmentation: 5 lumbar type vertebrae. Alignment: Normal. Vertebrae: There is malignant erosion of the right anterosuperior aspect of the S1 vertebral body, best appreciated on image # 40/17 secondary superior endplate fracture of S1 with mild loss of height anteriorly. No other fracture identified. Remaining vertebral body height is preserved. Paraspinal and other soft tissues: 1.6 x 3.3 cm infiltrative soft tissue mass is seen along the right anterolateral aspect of S1 demonstrating perineural extension into the right S1 neural foramen without involvement of the thecal sac itself. The mass demonstrates encasement of the proximal right common iliac artery, best seen image # 107/20. The mass is indistinguishable from the adjacent L5-S1 intervertebral disc space though disc  space height is preserved. 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 Multiple surgical clips are seen in the region of the mass, several of which are encased by the mass. See complete report for CT examination the abdomen and pelvis. Disc levels: Intervertebral disc heights are preserved. Spinal canal is widely patent. No significant neuroforaminal narrowing. IMPRESSION: 1. 1.6 x 3.3 cm infiltrative soft tissue mass along the right anterolateral aspect of S1 2. Perineural extension into the right S1 neural foramen without involvement of the thecal sac itself. 3. 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 4. Associated malignant erosion of the right anterosuperior aspect of the S1 vertebral body with mild loss of height anteriorly. 5. The mass is indistinguishable from the adjacent L5-S1 intervertebral disc space though disc space height is preserved. 6. Encasement of the proximal  right common iliac artery. 7. These findings would be better assessed with contrast enhanced MRI examination Electronically Signed   By: Worthy Heads M.D.   On: 12/22/2023 22:18   CT ABDOMEN PELVIS W CONTRAST Result Date: 12/22/2023 CLINICAL DATA:  Abdominal and flank pain, recurrent right leg pain. Colon cancer. * Tracking Code: BO * EXAM: CT ABDOMEN AND PELVIS WITH CONTRAST TECHNIQUE: Multidetector CT imaging of the abdomen and pelvis was performed using the standard protocol following bolus administration of intravenous contrast. RADIATION DOSE REDUCTION: This exam was performed according to the departmental dose-optimization program which includes automated exposure control, adjustment of the mA and/or kV according to patient size and/or use of iterative reconstruction technique. CONTRAST:  96mL OMNIPAQUE  IOHEXOL  300 MG/ML  SOLN COMPARISON:  02/03/2023, MRI 06/13/2023 FINDINGS: Lower chest: No acute abnormality. Hepatobiliary: Mild hepatic steatosis. Scattered simple hepatic cysts. No  enhancing intrahepatic mass. No intra or extrahepatic biliary ductal dilation. Gallbladder unremarkable. Pancreas: Unremarkable Spleen: Numerous previously identified simple cyst within the spleen have decreased in size. No enhancing intra splenic mass. The spleen is normal in size. No perisplenic fluid collection. Adrenals/Urinary Tract: The adrenal glands are unremarkable. 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 This is best seen on axial image # 40/2 and sagittal image # 71/10. Interval development of mild associated right renal cortical atrophy suggesting this represents a mole standing obstruction. Delayed right renal cortical enhancement in keeping with high-grade obstruction. The left kidney is unremarkable. Bladder unremarkable. Stomach/Bowel: Surgical changes of sigmoid colectomy are identified. Stomach, small bowel, and large bowel are otherwise unremarkable there is no evidence of obstruction or focal inflammation. Appendix absent. Numerous surgical clips are seen within the retroperitoneum. Adjacent to these clips is a new infiltrative soft tissue mass measuring at least 4.5 x 1.7 cm at axial image # 38/2 abutting the right anterolateral margin of the S1 vertebral body with malignant destruction of the anterosuperior aspect of the vertebral body best seen on image # 37/2 and perineural extension into the right S1 neural foramen, best seen on image # 36-37/2. The mass is poorly delineated from the adjacent L5-S1 disc space though I suspect this structure is abutted but not directly invaded. Vascular/Lymphatic: Mild aortoiliac atherosclerotic calcification. The previously described recurrence retroperitoneal soft tissue mass abuts and encases the right internal iliac artery proximally at axial image # 38/2 and likely abuts but does not encase the left common iliac  vein. The abdominal vasculature is otherwise unremarkable. Reproductive: Status post hysterectomy. No adnexal masses. Other: No abdominal wall hernia Musculoskeletal: No acute bone abnormality. IMPRESSION: 1. Recurrent retroperitoneal soft tissue mass within the retroperitoneum abutting the right anterolateral margin of the S1 vertebral body resulting in malignant destruction of the anterosuperior aspect of the S1 vertebral body and perineural extension into the right S1 neural foramen. Encasement of the proximal right internal iliac artery. 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 2. Mild hepatic steatosis. 3. Surgical changes of sigmoid colectomy. 4.  Aortic Atherosclerosis (ICD10-I70.0). Electronically Signed   By: Worthy Heads M.D.   On: 12/22/2023 22:09   CT Head Wo Contrast Result Date: 12/22/2023 CLINICAL DATA:  Syncope/presyncope, cerebrovascular cause suspected EXAM: CT HEAD WITHOUT CONTRAST TECHNIQUE: Contiguous axial images were obtained from the base of the skull through the vertex without intravenous contrast. RADIATION DOSE REDUCTION: This exam was performed according to the departmental dose-optimization  program which includes automated exposure control, adjustment of the mA and/or kV according to patient size and/or use of iterative reconstruction technique. COMPARISON:  None Available. FINDINGS: Brain: No evidence of large-territorial acute infarction. No parenchymal hemorrhage. No mass lesion. No extra-axial collection. No mass effect or midline shift. No hydrocephalus. Basilar cisterns are patent. Vascular: No hyperdense vessel. Skull: No acute fracture or focal lesion. Sinuses/Orbits: Paranasal sinuses and mastoid air cells are clear. The orbits are unremarkable. Other: Left parieto-occipital scalp soft tissue density with calcification likely a sebaceous cyst. IMPRESSION: No acute intracranial abnormality. Electronically  Signed   By: Morgane  Naveau M.D.   On: 12/22/2023 19:44    Procedures Procedures    Medications Ordered in ED Medications  iohexol  (OMNIPAQUE ) 300 MG/ML solution 30 mL (30 mLs Oral Contrast Given 12/22/23 1801)  iohexol  (OMNIPAQUE ) 300 MG/ML solution 96 mL (96 mLs Intravenous Contrast Given 12/22/23 1916)  HYDROcodone -acetaminophen  (NORCO/VICODIN) 5-325 MG per tablet 1 tablet (1 tablet Oral Given 12/22/23 2125)    ED Course/ Medical Decision Making/ A&P                                 Medical Decision Making Patient complains of severe pain to right back and right abdomen  Amount and/or Complexity of Data Reviewed Independent Historian: spouse    Details: Patient is here with her husband and family member who are supportive Labs: ordered. Decision-making details documented in ED Course.    Details: Troponin is negative x 1 Radiology: ordered.    Details: .  CT abdomen and pelvis with LS-spine shows  recurrent retroperitoneal soft tissue mass within the retroperitoneum abutting the right anterolateral margin of the S1 vertebral body resulting in malignant destruction of the anterosuperior aspect of the S1 vertebral body and perineural extension into the right S1 neural foramen. Encasement of the proximal right internal iliac artery. 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 CT head no acute abnormality.  ECG/medicine tests: ordered and independent interpretation performed. Decision-making details documented in ED Course.    Details: EKG shows prolonged QT of 501 Discussion of management or test interpretation with external provider(s): I discussed the patient with hospitalist who will admit General Surgery consulted.  Surgery advised they do not feel patient needs emergent surgical evaluation.  Surgery advised patient may need follow-up biopsy.  They advised urology consult and possible oncology and radiation  oncology evaluation.  Risk Prescription drug management. Decision regarding hospitalization. Risk Details: Patient presents to the emergency department after a syncopal episode.  She has been suffering with severe pain to her right leg and her right back which has made it impossible for her to sleep or rest for the past 4 days.  Patient had a syncopal episode with approximately 5 minutes of loss of consciousness last p.m.  Patient complains of pain going down her right leg and right lower quadrant.  Patient is reported to have struck her head I obtained a CT of patient's head which shows no acute abnormality EKG showed a prolonged QT.  I obtained a troponin due to patient's syncopal episode which is negative.  CT abdomen pelvis and lumbar spine shows a retroperitoneal mass along with hydronephrosis, malignant destruction of the S1 vertebral body as well as encasement of the right internal iliac artery.           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

## 2023-12-22 NOTE — ED Triage Notes (Addendum)
 Recurrent right leg pain , persistent thigh and calf pain , worse usually at night . Hx sciatica . No chest pain or shortness of breath . Had xray on whole leg , no Dx . Was suggested physical therapy , no close appointment .   Adds syncope last night , head injury against the door frame , husband helped her up . No neck pain , was seen at [facility] but left after triage due to long wait .  Alert and oriented x 4 , ambulatory to triage room .

## 2023-12-22 NOTE — ED Triage Notes (Signed)
 Pt states she had a syncopal episode this morning. Pt states she hasn't slept well in the past 4 days due to right leg pain. Pt denies chest pain

## 2023-12-23 ENCOUNTER — Observation Stay (HOSPITAL_COMMUNITY)

## 2023-12-23 ENCOUNTER — Encounter (HOSPITAL_BASED_OUTPATIENT_CLINIC_OR_DEPARTMENT_OTHER): Payer: Self-pay | Admitting: Surgery

## 2023-12-23 DIAGNOSIS — N1330 Unspecified hydronephrosis: Secondary | ICD-10-CM | POA: Diagnosis present

## 2023-12-23 DIAGNOSIS — N134 Hydroureter: Secondary | ICD-10-CM | POA: Diagnosis not present

## 2023-12-23 DIAGNOSIS — C187 Malignant neoplasm of sigmoid colon: Secondary | ICD-10-CM | POA: Diagnosis not present

## 2023-12-23 DIAGNOSIS — R55 Syncope and collapse: Secondary | ICD-10-CM | POA: Diagnosis present

## 2023-12-23 DIAGNOSIS — R1900 Intra-abdominal and pelvic swelling, mass and lump, unspecified site: Secondary | ICD-10-CM

## 2023-12-23 DIAGNOSIS — Z85038 Personal history of other malignant neoplasm of large intestine: Secondary | ICD-10-CM | POA: Insufficient documentation

## 2023-12-23 DIAGNOSIS — E785 Hyperlipidemia, unspecified: Secondary | ICD-10-CM | POA: Diagnosis present

## 2023-12-23 DIAGNOSIS — Z8509 Personal history of malignant neoplasm of other digestive organs: Secondary | ICD-10-CM | POA: Diagnosis not present

## 2023-12-23 DIAGNOSIS — M545 Low back pain, unspecified: Secondary | ICD-10-CM | POA: Diagnosis not present

## 2023-12-23 DIAGNOSIS — Z79899 Other long term (current) drug therapy: Secondary | ICD-10-CM | POA: Diagnosis not present

## 2023-12-23 DIAGNOSIS — D734 Cyst of spleen: Secondary | ICD-10-CM | POA: Insufficient documentation

## 2023-12-23 DIAGNOSIS — F17210 Nicotine dependence, cigarettes, uncomplicated: Secondary | ICD-10-CM | POA: Diagnosis not present

## 2023-12-23 DIAGNOSIS — K689 Other disorders of retroperitoneum: Secondary | ICD-10-CM | POA: Diagnosis not present

## 2023-12-23 DIAGNOSIS — R935 Abnormal findings on diagnostic imaging of other abdominal regions, including retroperitoneum: Secondary | ICD-10-CM | POA: Diagnosis not present

## 2023-12-23 DIAGNOSIS — N135 Crossing vessel and stricture of ureter without hydronephrosis: Secondary | ICD-10-CM | POA: Diagnosis not present

## 2023-12-23 DIAGNOSIS — Z90710 Acquired absence of both cervix and uterus: Secondary | ICD-10-CM | POA: Diagnosis present

## 2023-12-23 DIAGNOSIS — D483 Neoplasm of uncertain behavior of retroperitoneum: Secondary | ICD-10-CM | POA: Diagnosis not present

## 2023-12-23 DIAGNOSIS — C20 Malignant neoplasm of rectum: Secondary | ICD-10-CM | POA: Diagnosis not present

## 2023-12-23 DIAGNOSIS — C7801 Secondary malignant neoplasm of right lung: Secondary | ICD-10-CM | POA: Diagnosis not present

## 2023-12-23 DIAGNOSIS — N1339 Other hydronephrosis: Secondary | ICD-10-CM | POA: Diagnosis not present

## 2023-12-23 DIAGNOSIS — J432 Centrilobular emphysema: Secondary | ICD-10-CM | POA: Diagnosis not present

## 2023-12-23 DIAGNOSIS — K7689 Other specified diseases of liver: Secondary | ICD-10-CM | POA: Insufficient documentation

## 2023-12-23 LAB — POTASSIUM: Potassium: 3.6 mmol/L (ref 3.5–5.1)

## 2023-12-23 LAB — MAGNESIUM: Magnesium: 2.3 mg/dL (ref 1.7–2.4)

## 2023-12-23 LAB — COMPREHENSIVE METABOLIC PANEL WITH GFR
ALT: 13 U/L (ref 0–44)
AST: 17 U/L (ref 15–41)
Albumin: 4.7 g/dL (ref 3.5–5.0)
Alkaline Phosphatase: 77 U/L (ref 38–126)
Anion gap: 8 (ref 5–15)
BUN: 17 mg/dL (ref 8–23)
CO2: 28 mmol/L (ref 22–32)
Calcium: 9.2 mg/dL (ref 8.9–10.3)
Chloride: 103 mmol/L (ref 98–111)
Creatinine, Ser: 0.89 mg/dL (ref 0.44–1.00)
GFR, Estimated: >60 mL/min (ref >60)
Glucose, Bld: 94 mg/dL (ref 70–99)
Potassium: 4.3 mmol/L (ref 3.5–5.1)
Sodium: 139 mmol/L (ref 135–145)
Total Bilirubin: 0.7 mg/dL (ref 0.0–1.2)
Total Protein: 7.7 g/dL (ref 6.5–8.1)

## 2023-12-23 LAB — CBC
HCT: 38 % (ref 36.0–46.0)
Hemoglobin: 12.2 g/dL (ref 12.0–15.0)
MCH: 30 pg (ref 26.0–34.0)
MCHC: 32.1 g/dL (ref 30.0–36.0)
MCV: 93.6 fL (ref 80.0–100.0)
Platelets: 281 10*3/uL (ref 150–400)
RBC: 4.06 MIL/uL (ref 3.87–5.11)
RDW: 13.1 % (ref 11.5–15.5)
WBC: 6.5 10*3/uL (ref 4.0–10.5)
nRBC: 0 % (ref 0.0–0.2)

## 2023-12-23 LAB — URINALYSIS, MICROSCOPIC (REFLEX)

## 2023-12-23 LAB — URINALYSIS, ROUTINE W REFLEX MICROSCOPIC
Bilirubin Urine: NEGATIVE
Glucose, UA: NEGATIVE mg/dL
Ketones, ur: NEGATIVE mg/dL
Leukocytes,Ua: NEGATIVE
Nitrite: NEGATIVE
Protein, ur: NEGATIVE mg/dL
Specific Gravity, Urine: 1.01 (ref 1.005–1.030)
pH: 7 (ref 5.0–8.0)

## 2023-12-23 LAB — PHOSPHORUS: Phosphorus: 3.2 mg/dL (ref 2.5–4.6)

## 2023-12-23 LAB — CREATININE, SERUM
Creatinine, Ser: 0.79 mg/dL (ref 0.44–1.00)
GFR, Estimated: >60 mL/min (ref >60)

## 2023-12-23 LAB — PROTIME-INR
INR: 1.1 (ref 0.8–1.2)
Prothrombin Time: 14.1 s (ref 11.4–15.2)

## 2023-12-23 LAB — TROPONIN I (HIGH SENSITIVITY): Troponin I (High Sensitivity): 6 ng/L (ref <18)

## 2023-12-23 LAB — HIV ANTIBODY (ROUTINE TESTING W REFLEX): HIV Screen 4th Generation wRfx: NONREACTIVE

## 2023-12-23 LAB — SODIUM: Sodium: 138 mmol/L (ref 135–145)

## 2023-12-23 MED ORDER — OXYCODONE HCL 5 MG PO TABS
5.0000 mg | ORAL_TABLET | Freq: Four times a day (QID) | ORAL | Status: DC | PRN
  Administered 2023-12-23: 5 mg via ORAL
  Filled 2023-12-23: qty 1

## 2023-12-23 MED ORDER — ROSUVASTATIN CALCIUM 5 MG PO TABS
5.0000 mg | ORAL_TABLET | Freq: Every day | ORAL | Status: DC
  Administered 2023-12-24 – 2023-12-26 (×3): 5 mg via ORAL
  Filled 2023-12-23 (×3): qty 1

## 2023-12-23 MED ORDER — SENNOSIDES-DOCUSATE SODIUM 8.6-50 MG PO TABS
1.0000 | ORAL_TABLET | Freq: Two times a day (BID) | ORAL | Status: DC
  Administered 2023-12-23 – 2023-12-26 (×6): 1 via ORAL
  Filled 2023-12-23 (×6): qty 1

## 2023-12-23 MED ORDER — MELATONIN 5 MG PO TABS
5.0000 mg | ORAL_TABLET | Freq: Every evening | ORAL | Status: DC | PRN
  Administered 2023-12-23 – 2023-12-25 (×3): 5 mg via ORAL
  Filled 2023-12-23 (×3): qty 1

## 2023-12-23 MED ORDER — KETOROLAC TROMETHAMINE 15 MG/ML IJ SOLN
15.0000 mg | Freq: Once | INTRAMUSCULAR | Status: AC
Start: 2023-12-23 — End: 2023-12-23
  Administered 2023-12-23: 15 mg via INTRAVENOUS
  Filled 2023-12-23: qty 1

## 2023-12-23 MED ORDER — ACETAMINOPHEN 325 MG PO TABS
650.0000 mg | ORAL_TABLET | Freq: Four times a day (QID) | ORAL | Status: DC | PRN
  Administered 2023-12-23: 650 mg via ORAL
  Filled 2023-12-23: qty 2

## 2023-12-23 MED ORDER — HYDROMORPHONE HCL 1 MG/ML IJ SOLN
0.5000 mg | Freq: Once | INTRAMUSCULAR | Status: AC
  Administered 2023-12-23: 0.5 mg via INTRAVENOUS
  Filled 2023-12-23: qty 1

## 2023-12-23 MED ORDER — METHOCARBAMOL 500 MG PO TABS
500.0000 mg | ORAL_TABLET | Freq: Three times a day (TID) | ORAL | Status: DC | PRN
  Administered 2023-12-24: 500 mg via ORAL
  Filled 2023-12-23: qty 1

## 2023-12-23 MED ORDER — PANTOPRAZOLE SODIUM 40 MG PO TBEC
40.0000 mg | DELAYED_RELEASE_TABLET | Freq: Every day | ORAL | Status: DC
Start: 2023-12-23 — End: 2023-12-26
  Administered 2023-12-23 – 2023-12-26 (×4): 40 mg via ORAL
  Filled 2023-12-23 (×4): qty 1

## 2023-12-23 MED ORDER — POLYETHYLENE GLYCOL 3350 17 G PO PACK
17.0000 g | PACK | Freq: Every day | ORAL | Status: DC | PRN
  Administered 2023-12-25 – 2023-12-26 (×2): 17 g via ORAL
  Filled 2023-12-23 (×2): qty 1

## 2023-12-23 MED ORDER — HEPARIN SODIUM (PORCINE) 5000 UNIT/ML IJ SOLN: 5000.0000 [IU] | Freq: Three times a day (TID) | INTRAMUSCULAR | Status: DC

## 2023-12-23 MED ORDER — OXYCODONE HCL 5 MG PO TABS
5.0000 mg | ORAL_TABLET | ORAL | Status: AC | PRN
  Administered 2023-12-23 – 2023-12-25 (×6): 5 mg via ORAL
  Filled 2023-12-23 (×6): qty 1

## 2023-12-23 MED ORDER — FENTANYL CITRATE PF 50 MCG/ML IJ SOSY
25.0000 ug | PREFILLED_SYRINGE | INTRAMUSCULAR | Status: AC | PRN
  Administered 2023-12-23 – 2023-12-25 (×2): 25 ug via INTRAVENOUS
  Filled 2023-12-23 (×2): qty 1

## 2023-12-23 MED ORDER — VITAMIN B-12 1000 MCG PO TABS
1000.0000 ug | ORAL_TABLET | Freq: Every day | ORAL | Status: DC
  Administered 2023-12-23 – 2023-12-26 (×4): 1000 ug via ORAL
  Filled 2023-12-23 (×4): qty 1

## 2023-12-23 MED ORDER — IOHEXOL 300 MG/ML  SOLN
75.0000 mL | Freq: Once | INTRAMUSCULAR | Status: AC | PRN
  Administered 2023-12-23: 75 mL via INTRAVENOUS

## 2023-12-23 MED ORDER — PROCHLORPERAZINE EDISYLATE 10 MG/2ML IJ SOLN: 5.0000 mg | Freq: Four times a day (QID) | INTRAMUSCULAR | Status: DC | PRN

## 2023-12-23 MED ORDER — CALCIUM CITRATE 950 (200 CA) MG PO TABS
1.0000 | ORAL_TABLET | Freq: Every day | ORAL | Status: DC
  Administered 2023-12-24 – 2023-12-26 (×3): 950 mg via ORAL
  Filled 2023-12-23 (×3): qty 1

## 2023-12-23 MED ORDER — FENTANYL CITRATE PF 50 MCG/ML IJ SOSY
25.0000 ug | PREFILLED_SYRINGE | Freq: Once | INTRAMUSCULAR | Status: AC
  Administered 2023-12-23: 25 ug via INTRAVENOUS
  Filled 2023-12-23: qty 1

## 2023-12-23 NOTE — H&P (Signed)
 History and Physical    Patient: Regina Young NFA:[id number] DOB: 08-09-1956 DOA: 12/22/2023 DOS: the patient was seen and examined on 12/23/2023 PCP: Alvie Ax, MD (Inactive)  Patient coming from: Home  Chief Complaint:  Chief Complaint  Patient presents with   Leg Pain    right   HPI: [name] is a 68 y.o. female with medical history significant of colon cancer, hyperlipidemia,, liver cyst, splenic cyst, history of abdominal hysterectomy who presented to the emergency department after having a syncopal episode at home.  She has not been sleeping well due to worsening of back pain radiating to her lower right lower extremity. He denied fever, chills, rhinorrhea, sore throat, wheezing or hemoptysis.  No chest pain, palpitations, diaphoresis, PND, orthopnea or pitting edema of the lower extremities.  No abdominal pain, nausea, emesis, diarrhea, melena or hematochezia.  No flank pain, dysuria, frequency or hematuria.  No polyuria, polydipsia, polyphagia or blurred vision.   Lab work: Urinalysis showed trace hemoglobin and rare bacteria on microscopic examination, but was otherwise normal.  CBC showed a white count of 6.5, hemoglobin 12.2 g/dL and platelets 469.  Troponin x 2 with the same result at 6 ng/L.  Normal creatinine and GFR.  Imaging: CT head without contrast with no acute intracranial normality.  CT abdomen/pelvis with contrast showing recurrent retroperitoneal soft tissue mass within the retroperitoneum abutting the right anterolateral margin of the S1 vertebral body resulting in mild luminal destruction over the superior aspect of the S1 vertebral body and perineural extension into the right S1 neural foramina.  There is severe right hydronephrosis and proximal hydroureter with malignant occlusion of the distal ureter.  There is encasement of the proximal right internal iliac artery.  Mild hepatic steatosis.  Aortic atherosclerosis.  Post sigmoid colectomy.   ED course:  Initial vital signs were temperature 98.4 F, pulse 113, respirations 16, BP 183/98 mmHg O2 sat 96% on room air.  The patient received hydrocodone/acetaminophen 5/125 mg tablet x 1 and hydromorphone 0.5 mg IVP x 1.  Review of Systems: As mentioned in the history of present illness. All other systems reviewed and are negative. Past Medical History:  Diagnosis Date   Cancer of sigmoid colon, Stage 1, pT1pN0, s/p robotic LAR resection 02/21/2021 02/21/2021   Hyperlipidemia    Past Surgical History:  Procedure Laterality Date   ABDOMINAL HYSTERECTOMY  1999   [name], MD   BIOPSY  01/01/2021   Procedure: BIOPSY;  Surgeon: Joyce Nixon, MD;  Location: [facility];  Service: Endoscopy;;   FLEXIBLE SIGMOIDOSCOPY N/A 01/01/2021   Procedure: FLEXIBLE SIGMOIDOSCOPY WITH BIOPSY;  Surgeon: Joyce Nixon, MD;  Location: [facility] ENDOSCOPY;  Service: Endoscopy;  Laterality: N/A;  IV SEDATION BY SURGEON   XI ROBOTIC ASSISTED LOWER ANTERIOR RESECTION N/A 02/21/2021   Procedure: XI ROBOTIC ASSISTED LOWER ANTERIOR RESECTION;  Surgeon: Joyce Nixon, MD;  Location: [facility] ORS;  Service: General;  Laterality: N/A;   Social History:  reports that she has been smoking cigarettes. She has a 12.5 pack-year smoking history. She has never used smokeless tobacco. She reports current alcohol use. She reports that she does not use drugs.  No Known Allergies  History reviewed. No pertinent family history.  Prior to Admission medications   Medication Sig Start Date End Date Taking? Authorizing Provider  methocarbamol (ROBAXIN) 500 MG tablet Take 1 tablet (500 mg total) by mouth every 8 (eight) hours as needed for muscle spasms. 12/21/23   Persons, Norma Beckers, PA  acetaminophen (TYLENOL) 500 MG tablet  Take 500-1,000 mg by mouth every 6 (six) hours as needed (PAIN).    [provider]  ibuprofen (ADVIL) 200 MG tablet Take 200-400 mg by mouth every 8 (eight) hours as needed (for pain.).    [provider]   naproxen sodium (ALEVE) 220 MG tablet Take 220 mg by mouth 2 (two) times daily as needed (pain).    [provider]  rosuvastatin (CRESTOR) 5 MG tablet Take 5 mg by mouth in the morning. (0800) 11/24/20   [provider]  traMADol (ULTRAM) 50 MG tablet Take 1 tablet (50 mg total) by mouth every 6 (six) hours as needed for moderate pain. 02/23/21   Harriette Bouillon, MD    Physical Exam: Vitals:   12/23/23 0042 12/23/23 0146 12/23/23 0237 12/23/23 0654  BP: (!) 168/104 (!) 157/97 (!) 154/89 (!) 152/89  Pulse: 95 99 94 93  Resp: 20 17 18 18   Temp: 98.4 F (36.9 C)  (!) 97.5 F (36.4 C) 98.6 F (37 C)  TempSrc: Oral  Oral Oral  SpO2: 95% 96% 97% 97%   Physical Exam Vitals and nursing note reviewed.  Constitutional:      General: She is awake. She is not in acute distress.    Appearance: She is ill-appearing.  HENT:     Head: Normocephalic.     Nose: No rhinorrhea.     Mouth/Throat:     Mouth: Mucous membranes are moist.  Eyes:     General: No scleral icterus.    Pupils: Pupils are equal, round, and reactive to light.  Neck:     Vascular: No JVD.  Cardiovascular:     Rate and Rhythm: Normal rate and regular rhythm.     Heart sounds: S1 normal and S2 normal.  Pulmonary:     Effort: Pulmonary effort is normal.     Breath sounds: Normal breath sounds. No wheezing, rhonchi or rales.  Abdominal:     General: Bowel sounds are normal. There is no distension.     Palpations: Abdomen is soft.     Tenderness: There is no abdominal tenderness.  Musculoskeletal:     Cervical back: Neck supple.     Lumbar back: Tenderness present.     Right lower leg: No edema.     Left lower leg: No edema.  Skin:    General: Skin is warm and dry.  Neurological:     General: No focal deficit present.     Mental Status: She is alert and oriented to person, place, and time.  Psychiatric:        Mood and Affect: Mood normal.        Behavior: Behavior normal. Behavior is cooperative.     Data Reviewed:  Results are pending, will review when available.  EKG: Vent. rate 95 BPM PR interval 138 ms QRS duration 80 ms QT/QTcB 372/467 ms P-R-T axes 72 18 44 poor data quality, interpretation may be adversely affected Normal sinus rhythm Possible Inferior infarct , age undetermined Cannot rule out Anterior infarct , age undetermined Abnormal ECG  Assessment and Plan: Principal Problem: Acute and severe   Low back pain History of:   Cancer of sigmoid colon, Stage 1, pT1pN0,     s/p robotic LAR resection 02/21/2021 Now showing:   Retroperitoneal mass Observation/MedSurg. Continue IV fluids. May have diet. Keep n.p.o. after midnight. Analgesics as needed. Antiemetics as needed. Pantoprazole 40 mg IVP daily. Follow CBC, CMP in AM. IR consulted for biopsy. Urology, oncology and general  surgery consults appreciated.  Active Problems:   Syncope and collapse Continue IV fluids. Correct electrolyte abnormality. Check carotid Doppler. Check echocardiogram.    Hyperlipidemia Continue rosuvastatin 5 mg p.o. daily.    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 No need for urgent procedure at this time. Will continue to monitor electrolytes/GFR.     Advance Care Planning:   Code Status: Full Code   Consults:   Family Communication:   Severity of Illness: The appropriate patient status for this patient is OBSERVATION. Observation status is judged to be reasonable and necessary in order to provide the required intensity of service to ensure the patient's safety. The patient's presenting symptoms, physical exam findings, and initial radiographic and laboratory data in the context of their medical condition is felt to place them at decreased risk for further clinical deterioration. Furthermore, it is anticipated that the patient will be medically stable for discharge from the hospital within 2 midnights of admission.   Author: Danice Dural, MD 12/23/2023 7:55 AM  For on call review [email or web address].   This document was prepared using Dragon voice recognition software and may contain some unintended transcription errors.

## 2023-12-23 NOTE — Consult Note (Addendum)
 [facility] CONSULT NOTE  Patient Care Team: Alvie Ax, MD (Inactive) as PCP - General (Internal Medicine) Joyce Nixon, MD as Consulting Physician (General Surgery) Felecia Hopper, MD as Consulting Physician (Gastroenterology)  CHIEF COMPLAINTS/PURPOSE OF CONSULTATION:  Retroperitoneal mass  REFERRING PHYSICIAN: Dr. Bonita Bussing  HISTORY OF PRESENTING ILLNESS:  Regina Young 68 y.o. female who presented 12/22/23 to the ED complaining of abdominal pain, back pain, recurrent right lower extremity pain, and syncope. Workup was done in the ED which included labs and imaging including CT scans.  CT scan showed recurrent retroperitoneal soft tissue mass in the retroperitoneum with malignant occlusion of distal right ureter.  Therefore oncology consult has been requested. Patient seen awake alert and oriented x 3 laying supine in bed.  Patient's sister is at bedside.  Patient appears to be a good historian and details her chief complaints.  Reports right lower extremity pain and abdominal pain started approximately 2 months ago and worsened at night to the point where she could not get comfortable.  She felt her knee would "pop out".  Also complained of severe worsening constipation.  Went to see her PCP who did x-rays and later referred her to Ortho.  Was told it may be sciatica and was referred for physical therapy.  She was given MiraLAX and Dulcolax for the constipation.  However 2 days ago the abdominal pain became so severe and she became hot and clammy and just did not "feel well".  Reports "passing out" which precipitated family to bring her into the ED.  Denies melena, hematuria or dysuria.  Denies nausea or vomiting.  Admits to ongoing constipation.  Admits to RLE and buttock pain.  Medical history significant for history of colon cancer with resection in June 2022.  States due to stage I diagnosis she did not need any other treatment.  Admits to no follow-up with no repeat  colonoscopy since then.  Medical history also includes hyperlipidemia for which she is on a statin. Surgical history includes surgical resection of colon cancer as stated above in 2022.  Abdominal hysterectomy in 1999. Family history includes mother with liver cancer who died at age 40 years old.  Thinks her father may have had polycythemia vera and myasthenia gravis.  Paternal grandmother with unknown cancer. Social history significant for tobacco use which she started in high school, currently 10 cigarettes/day.  Admits to occasional social alcohol use.  Denies illicit or recreational drugs.  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     I have reviewed her chart and materials related to her cancer extensively and collaborated history with the patient. Summary of oncologic history is as follows: Oncology History   No history exists.    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  Mass indistinguishable from adjacent L5-S1 disc space.  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  CT head negative.  MRI has been recommended. -Will need IR eval for biopsy for definitive pathology. - Urology and surgical teams following - Medical oncology/Dr. Maria Shiner following.  Further evaluation and treatment recommendations will be forthcoming.  History of colon cancer, stage I - Diagnosed in 2022 - Status post resection 02/21/2021, no metastasis at the time.  No further workup or treatment done at that time.  Patient  has not had repeat colonoscopy since the surgery.  Hyperlipidemia -Monitor labs  Constipation - Recommend laxatives, bowel regimen as needed   MEDICAL HISTORY:  Past Medical History:   Diagnosis Date   Cancer of sigmoid colon, Stage 1, pT1pN0, s/p robotic LAR resection 02/21/2021 02/21/2021   Hyperlipidemia     SURGICAL HISTORY: Past Surgical History:  Procedure Laterality Date   ABDOMINAL HYSTERECTOMY  1999   [name], MD   BIOPSY  01/01/2021   Procedure: BIOPSY;  Surgeon: Joyce Nixon, MD;  Location: [facility];  Service: Endoscopy;;   FLEXIBLE SIGMOIDOSCOPY N/A 01/01/2021   Procedure: FLEXIBLE SIGMOIDOSCOPY WITH BIOPSY;  Surgeon: Joyce Nixon, MD;  Location: [facility];  Service: Endoscopy;  Laterality: N/A;  IV SEDATION BY SURGEON   XI ROBOTIC ASSISTED LOWER ANTERIOR RESECTION N/A 02/21/2021   Procedure: XI ROBOTIC ASSISTED LOWER ANTERIOR RESECTION;  Surgeon: Joyce Nixon, MD;  Location: [facility];  Service: General;  Laterality: N/A;    SOCIAL HISTORY: Social History   Socioeconomic History   Marital status: Married    Spouse name: Not on file   Number of children: Not on file   Years of education: Not on file   Highest education level: Not on file  Occupational History   Not on file  Tobacco Use   Smoking status: Every Day    Current packs/day: 0.50    Average packs/day: 0.5 packs/day for 25.0 years (12.5 ttl pk-yrs)    Types: Cigarettes   Smokeless tobacco: Never  Vaping Use   Vaping status: Never Used  Substance and Sexual Activity   Alcohol use: Yes    Comment: occas.   Drug use: Never   Sexual activity: Not on file  Other Topics Concern   Not on file  Social History Narrative   Not on file   Social Drivers of Health   Financial Resource Strain: Not on file  Food Insecurity: No Food Insecurity (12/23/2023)   Hunger Vital Sign    Worried About Running Out of Food in the Last Year: Never true    Ran Out of Food in the Last Year: Never true  Transportation Needs: No Transportation Needs (12/23/2023)   PRAPARE - Administrator, Civil Service (Medical): No    Lack of Transportation (Non-Medical): No  Physical  Activity: Not on file  Stress: Not on file  Social Connections: Moderately Isolated (12/23/2023)   Social Connection and Isolation Panel [NHANES]    Frequency of Communication with Friends and Family: More than three times a week    Frequency of Social Gatherings with Friends and Family: More than three times a week    Attends Religious Services: Never    Database administrator or Organizations: No    Attends Banker Meetings: Never    Marital Status: Married  Catering manager Violence: Not At Risk (12/23/2023)   Humiliation, Afraid, Rape, and Kick questionnaire    Fear of Current or Ex-Partner: No    Emotionally Abused: No    Physically Abused: No    Sexually Abused: No    FAMILY HISTORY: History reviewed. No pertinent family history.   PHYSICAL EXAMINATION: ECOG PERFORMANCE STATUS: 2 - Symptomatic, <50% confined to bed  Vitals:   12/23/23 0932 12/23/23 1310  BP: (!) 149/90 (!) 164/89  Pulse: 95 (!) 102  Resp: 16 16  Temp:  98.5 F (36.9 C)  SpO2: 96% 95%   Filed Weights   12/23/23 0824  Weight: 101 lb (45.8 kg)  GENERAL: alert, + cachectic, + mild distress and comfortable SKIN: skin color, texture, turgor are normal, no rashes or significant lesions EYES: normal, conjunctiva are pink and non-injected, sclera clear OROPHARYNX: no exudate, no erythema and lips, buccal mucosa, and tongue normal  NECK: supple, thyroid normal size, non-tender, without nodularity LYMPH: no palpable lymphadenopathy in the cervical, axillary or inguinal LUNGS: clear to auscultation and percussion with normal breathing effort HEART: regular rate & rhythm and no murmurs and no lower extremity edema ABDOMEN: abdomen soft, non-tender and normal bowel sounds MUSCULOSKELETAL: no cyanosis of digits and no clubbing  PSYCH: alert & oriented x 3 with fluent speech NEURO: no focal motor/sensory deficits   ALLERGIES:  has no known allergies.  MEDICATIONS:  Current  Facility-Administered Medications  Medication Dose Route Frequency Provider Last Rate Last Admin   acetaminophen (TYLENOL) tablet 650 mg  650 mg Oral Q6H PRN Bary Boss, DO   650 mg at 12/23/23 0981   fentaNYL (SUBLIMAZE) injection 25 mcg  25 mcg Intravenous Q2H PRN Danice Dural, MD       melatonin tablet 5 mg  5 mg Oral QHS PRN Reesa Cannon N, DO       oxyCODONE (Oxy IR/ROXICODONE) immediate release tablet 5 mg  5 mg Oral Q4H PRN Danice Dural, MD       polyethylene glycol (MIRALAX / GLYCOLAX) packet 17 g  17 g Oral Daily PRN Reesa Cannon N, DO       prochlorperazine (COMPAZINE) injection 5 mg  5 mg Intravenous Q6H PRN Reesa Cannon N, DO         LABORATORY DATA:  I have reviewed the data as listed Lab Results  Component Value Date   WBC 6.5 12/23/2023   HGB 12.2 12/23/2023   HCT 38.0 12/23/2023   MCV 93.6 12/23/2023   PLT 281 12/23/2023   Recent Labs    12/22/23 0727 12/23/23 0501 12/23/23 1117  NA 137  --  138  K 3.7  --  3.6  CL 103  --   --   CO2 23  --   --   GLUCOSE 119*  --   --   BUN 14  --   --   CREATININE 0.92 0.79  --   CALCIUM 9.0  --   --   GFRNONAA >60 >60  --     RADIOGRAPHIC STUDIES: I have personally reviewed the radiological images as listed and agreed with the findings in the report. CT L-SPINE NO CHARGE Result Date: 12/22/2023 CLINICAL DATA:  Recurrent right leg pain, right thigh pain, right calf pain. History of colon cancer. * Tracking Code: BO * EXAM: CT LUMBAR SPINE WITHOUT CONTRAST TECHNIQUE: Multidetector CT imaging of the lumbar spine was performed without intravenous contrast administration. Multiplanar CT image reconstructions were also generated. RADIATION DOSE REDUCTION: This exam was performed according to the departmental dose-optimization program which includes automated exposure control, adjustment of the mA and/or kV according to patient size and/or use of iterative reconstruction technique. COMPARISON:  Findings are  correlated with concurrently performed CT examination the abdomen and pelvis. FINDINGS: Segmentation: 5 lumbar type vertebrae. Alignment: Normal. Vertebrae: There is malignant erosion of the right anterosuperior aspect of the S1 vertebral body, best appreciated on image # 40/17 secondary superior endplate fracture of S1 with mild loss of height anteriorly. No other fracture identified. Remaining vertebral body height is preserved. Paraspinal and other soft tissues: 1.6 x 3.3 cm infiltrative soft tissue mass is seen along the  right anterolateral aspect of S1 demonstrating perineural extension into the right S1 neural foramen without involvement of the thecal sac itself. The mass demonstrates encasement of the proximal right common iliac artery, best seen image # 107/20. The mass is indistinguishable from the adjacent L5-S1 intervertebral disc space though disc space height is preserved. 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 Multiple surgical clips are seen in the region of the mass, several of which are encased by the mass. See complete report for CT examination the abdomen and pelvis. Disc levels: Intervertebral disc heights are preserved. Spinal canal is widely patent. No significant neuroforaminal narrowing. IMPRESSION: 1. 1.6 x 3.3 cm infiltrative soft tissue mass along the right anterolateral aspect of S1 2. Perineural extension into the right S1 neural foramen without involvement of the thecal sac itself. 3. 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 4. Associated malignant erosion of the right anterosuperior aspect of the S1 vertebral body with mild loss of height anteriorly. 5. The mass is indistinguishable from the adjacent L5-S1 intervertebral disc space though disc space height is preserved. 6. Encasement of the proximal right common iliac artery. 7. These findings would be better assessed with contrast enhanced MRI examination Electronically Signed   By:  Worthy Heads M.D.   On: 12/22/2023 22:18   CT ABDOMEN PELVIS W CONTRAST Result Date: 12/22/2023 CLINICAL DATA:  Abdominal and flank pain, recurrent right leg pain. Colon cancer. * Tracking Code: BO * EXAM: CT ABDOMEN AND PELVIS WITH CONTRAST TECHNIQUE: Multidetector CT imaging of the abdomen and pelvis was performed using the standard protocol following bolus administration of intravenous contrast. RADIATION DOSE REDUCTION: This exam was performed according to the departmental dose-optimization program which includes automated exposure control, adjustment of the mA and/or kV according to patient size and/or use of iterative reconstruction technique. CONTRAST:  96mL OMNIPAQUE IOHEXOL 300 MG/ML  SOLN COMPARISON:  02/03/2023, MRI 06/13/2023 FINDINGS: Lower chest: No acute abnormality. Hepatobiliary: Mild hepatic steatosis. Scattered simple hepatic cysts. No enhancing intrahepatic mass. No intra or extrahepatic biliary ductal dilation. Gallbladder unremarkable. Pancreas: Unremarkable Spleen: Numerous previously identified simple cyst within the spleen have decreased in size. No enhancing intra splenic mass. The spleen is normal in size. No perisplenic fluid collection. Adrenals/Urinary Tract: The adrenal glands are unremarkable. 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 This is best seen on axial image # 40/2 and sagittal image # 71/10. Interval development of mild associated right renal cortical atrophy suggesting this represents a mole standing obstruction. Delayed right renal cortical enhancement in keeping with high-grade obstruction. The left kidney is unremarkable. Bladder unremarkable. Stomach/Bowel: Surgical changes of sigmoid colectomy are identified. Stomach, small bowel, and large bowel are otherwise unremarkable there is no evidence of obstruction or focal  inflammation. Appendix absent. Numerous surgical clips are seen within the retroperitoneum. Adjacent to these clips is a new infiltrative soft tissue mass measuring at least 4.5 x 1.7 cm at axial image # 38/2 abutting the right anterolateral margin of the S1 vertebral body with malignant destruction of the anterosuperior aspect of the vertebral body best seen on image # 37/2 and perineural extension into the right S1 neural foramen, best seen on image # 36-37/2. The mass is poorly delineated from the adjacent L5-S1 disc space though I suspect this structure is abutted but not directly invaded. Vascular/Lymphatic: Mild aortoiliac atherosclerotic calcification. The previously described  recurrence retroperitoneal soft tissue mass abuts and encases the right internal iliac artery proximally at axial image # 38/2 and likely abuts but does not encase the left common iliac vein. The abdominal vasculature is otherwise unremarkable. Reproductive: Status post hysterectomy. No adnexal masses. Other: No abdominal wall hernia Musculoskeletal: No acute bone abnormality. IMPRESSION: 1. Recurrent retroperitoneal soft tissue mass within the retroperitoneum abutting the right anterolateral margin of the S1 vertebral body resulting in malignant destruction of the anterosuperior aspect of the S1 vertebral body and perineural extension into the right S1 neural foramen. Encasement of the proximal right internal iliac artery. 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 2. Mild hepatic steatosis. 3. Surgical changes of sigmoid colectomy. 4.  Aortic Atherosclerosis (ICD10-I70.0). Electronically Signed   By: Worthy Heads M.D.   On: 12/22/2023 22:09   CT Head Wo Contrast Result Date: 12/22/2023 CLINICAL DATA:  Syncope/presyncope, cerebrovascular cause suspected EXAM: CT HEAD WITHOUT CONTRAST TECHNIQUE: Contiguous axial images were obtained from the base of the skull through  the vertex without intravenous contrast. RADIATION DOSE REDUCTION: This exam was performed according to the departmental dose-optimization program which includes automated exposure control, adjustment of the mA and/or kV according to patient size and/or use of iterative reconstruction technique. COMPARISON:  None Available. FINDINGS: Brain: No evidence of large-territorial acute infarction. No parenchymal hemorrhage. No mass lesion. No extra-axial collection. No mass effect or midline shift. No hydrocephalus. Basilar cisterns are patent. Vascular: No hyperdense vessel. Skull: No acute fracture or focal lesion. Sinuses/Orbits: Paranasal sinuses and mastoid air cells are clear. The orbits are unremarkable. Other: Left parieto-occipital scalp soft tissue density with calcification likely a sebaceous cyst. IMPRESSION: No acute intracranial abnormality. Electronically Signed   By: Morgane  Naveau M.D.   On: 12/22/2023 19:44   XR Pelvis 1-2 Views Result Date: 12/15/2023 AP pelvis some degenerative changes of the hips by back laterally with some sclerotic changes in the superior acetabulum no acute fractures  XR Lumbar Spine 2-3 Views Result Date: 12/15/2023 Radiographs of the lumbar spine demonstrate overall well-maintained alignment some endplate sclerotic changes and some facet arthropathy no acute fractures degenerative changes noted mostly at L5-S1    The total time spent in the appointment was 55 minutes encounter with patients including review of chart and various tests results, discussions about plan of care and coordination of care plan   All questions were answered. The patient knows to call the clinic with any problems, questions or concerns. No barriers to learning was detected.  Jacqualin Mate, NP 4/17/20251:53 PM   ADDENDUM: I saw and examined Ms. Bogue.  This is a difficult problem.  Looks like she has some type of retroperitoneal mass that seems to be invading into the spine.  Again, the  issue here is tissue.  We did a biopsy.  From my perspective, I really am not in the position to dictate how to biopsy this.  This really is not a medical oncology decision.  Regardless, however a biopsy is going to be done, we really need to have quite a bit of tissue for molecular markers.  I do not know if neurosurgery needs to be involved to see if they need to do an open biopsy.  I really cannot make any recommendations until we get tissue.  I would think it be highly unusual for a stage I colorectal cancer to recur after 3 years.  Maybe, this is not even a malignancy.  Maybe his retroperitoneal  fibrosis.  Labs have been done so far look pretty good.  I would think that an MRI would be helpful in the lower back to see if there might be more details as to the relationship between the tumor and the spine and nerves.  I would get a CEA level on her.  No LFTs have been done..  She has had no CT of the chest.  All these need to be done.  Again, we cannot make any decisions until I know this is malignant.    Rayleen Cal, MD

## 2023-12-23 NOTE — Progress Notes (Signed)
 Progress Note     Subjective: Eating an [place] ice which she is enjoying. No n/v. She describes mild abdominal discomfort that she attributes to gas and history of constipation but denies abdominal pain. Last BM was yesterday morning prior to admission. Still having right sided buttock and leg pain  Husband and sister at bedside  Objective: Vital signs in last 24 hours: Temp:  [97.5 F (36.4 C)-98.6 F (37 C)] 98.5 F (36.9 C) (04/17 1310) Pulse Rate:  [81-107] 102 (04/17 1310) Resp:  [13-33] 16 (04/17 1310) BP: (149-186)/(81-124) 164/89 (04/17 1310) SpO2:  [95 %-100 %] 95 % (04/17 1310) Weight:  [45.8 kg] 45.8 kg (04/17 0824) Last BM Date : 12/22/23  Intake/Output from previous day: 04/16 0701 - 04/17 0700 In: 0  Out: 500 [Urine:500] Intake/Output this shift: Total I/O In: 0  Out: 150 [Urine:150]  PE: General: pleasant, WD, female who is sitting up in bed in NAD Lungs:  Respiratory effort nonlabored Abd: soft, NT, ND, well healed suprapubic incision MSK: all 4 extremities are symmetrical with no cyanosis, clubbing, or edema. Skin: warm and dry  Psych: A&Ox3 with an appropriate affect.    Lab Results:  Recent Labs    12/22/23 0727 12/23/23 0501  WBC 10.5 6.5  HGB 13.0 12.2  HCT 40.2 38.0  PLT 335 281   BMET Recent Labs    12/22/23 0727 12/23/23 0501 12/23/23 1117  NA 137  --  138  K 3.7  --  3.6  CL 103  --   --   CO2 23  --   --   GLUCOSE 119*  --   --   BUN 14  --   --   CREATININE 0.92 0.79  --   CALCIUM 9.0  --   --    PT/INR Recent Labs    12/23/23 1117  LABPROT 14.1  INR 1.1   CMP     Component Value Date/Time   NA 138 12/23/2023 1117   K 3.6 12/23/2023 1117   CL 103 12/22/2023 0727   CO2 23 12/22/2023 0727   GLUCOSE 119 (H) 12/22/2023 0727   BUN 14 12/22/2023 0727   CREATININE 0.79 12/23/2023 0501   CALCIUM 9.0 12/22/2023 0727   PROT 5.6 (L) 02/21/2021 0800   ALBUMIN 3.7 02/21/2021 0800   AST 16 02/21/2021 0800   ALT 15  02/21/2021 0800   ALKPHOS 56 02/21/2021 0800   BILITOT 0.6 02/21/2021 0800   GFRNONAA >60 12/23/2023 0501   Lipase  No results found for: "LIPASE"     Studies/Results: CT L-SPINE NO CHARGE Result Date: 12/22/2023 CLINICAL DATA:  Recurrent right leg pain, right thigh pain, right calf pain. History of colon cancer. * Tracking Code: BO * EXAM: CT LUMBAR SPINE WITHOUT CONTRAST TECHNIQUE: Multidetector CT imaging of the lumbar spine was performed without intravenous contrast administration. Multiplanar CT image reconstructions were also generated. RADIATION DOSE REDUCTION: This exam was performed according to the departmental dose-optimization program which includes automated exposure control, adjustment of the mA and/or kV according to patient size and/or use of iterative reconstruction technique. COMPARISON:  Findings are correlated with concurrently performed CT examination the abdomen and pelvis. FINDINGS: Segmentation: 5 lumbar type vertebrae. Alignment: Normal. Vertebrae: There is malignant erosion of the right anterosuperior aspect of the S1 vertebral body, best appreciated on image # 40/17 secondary superior endplate fracture of S1 with mild loss of height anteriorly. No other fracture identified. Remaining vertebral body height is preserved. Paraspinal and other  soft tissues: 1.6 x 3.3 cm infiltrative soft tissue mass is seen along the right anterolateral aspect of S1 demonstrating perineural extension into the right S1 neural foramen without involvement of the thecal sac itself. The mass demonstrates encasement of the proximal right common iliac artery, best seen image # 107/20. The mass is indistinguishable from the adjacent L5-S1 intervertebral disc space though disc space height is preserved. 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 Multiple surgical clips are seen in the region of the mass, several of which are encased by the mass. See complete report for CT  examination the abdomen and pelvis. Disc levels: Intervertebral disc heights are preserved. Spinal canal is widely patent. No significant neuroforaminal narrowing. IMPRESSION: 1. 1.6 x 3.3 cm infiltrative soft tissue mass along the right anterolateral aspect of S1 2. Perineural extension into the right S1 neural foramen without involvement of the thecal sac itself. 3. 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 4. Associated malignant erosion of the right anterosuperior aspect of the S1 vertebral body with mild loss of height anteriorly. 5. The mass is indistinguishable from the adjacent L5-S1 intervertebral disc space though disc space height is preserved. 6. Encasement of the proximal right common iliac artery. 7. These findings would be better assessed with contrast enhanced MRI examination Electronically Signed   By: Worthy Heads M.D.   On: 12/22/2023 22:18   CT ABDOMEN PELVIS W CONTRAST Result Date: 12/22/2023 CLINICAL DATA:  Abdominal and flank pain, recurrent right leg pain. Colon cancer. * Tracking Code: BO * EXAM: CT ABDOMEN AND PELVIS WITH CONTRAST TECHNIQUE: Multidetector CT imaging of the abdomen and pelvis was performed using the standard protocol following bolus administration of intravenous contrast. RADIATION DOSE REDUCTION: This exam was performed according to the departmental dose-optimization program which includes automated exposure control, adjustment of the mA and/or kV according to patient size and/or use of iterative reconstruction technique. CONTRAST:  96mL OMNIPAQUE IOHEXOL 300 MG/ML  SOLN COMPARISON:  02/03/2023, MRI 06/13/2023 FINDINGS: Lower chest: No acute abnormality. Hepatobiliary: Mild hepatic steatosis. Scattered simple hepatic cysts. No enhancing intrahepatic mass. No intra or extrahepatic biliary ductal dilation. Gallbladder unremarkable. Pancreas: Unremarkable Spleen: Numerous previously identified simple cyst within the spleen have decreased in size. No  enhancing intra splenic mass. The spleen is normal in size. No perisplenic fluid collection. Adrenals/Urinary Tract: The adrenal glands are unremarkable. 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 This is best seen on axial image # 40/2 and sagittal image # 71/10. Interval development of mild associated right renal cortical atrophy suggesting this represents a mole standing obstruction. Delayed right renal cortical enhancement in keeping with high-grade obstruction. The left kidney is unremarkable. Bladder unremarkable. Stomach/Bowel: Surgical changes of sigmoid colectomy are identified. Stomach, small bowel, and large bowel are otherwise unremarkable there is no evidence of obstruction or focal inflammation. Appendix absent. Numerous surgical clips are seen within the retroperitoneum. Adjacent to these clips is a new infiltrative soft tissue mass measuring at least 4.5 x 1.7 cm at axial image # 38/2 abutting the right anterolateral margin of the S1 vertebral body with malignant destruction of the anterosuperior aspect of the vertebral body best seen on image # 37/2 and perineural extension into the right S1 neural foramen, best seen on image # 36-37/2. The mass is poorly delineated from the adjacent L5-S1 disc space though I suspect this structure  is abutted but not directly invaded. Vascular/Lymphatic: Mild aortoiliac atherosclerotic calcification. The previously described recurrence retroperitoneal soft tissue mass abuts and encases the right internal iliac artery proximally at axial image # 38/2 and likely abuts but does not encase the left common iliac vein. The abdominal vasculature is otherwise unremarkable. Reproductive: Status post hysterectomy. No adnexal masses. Other: No abdominal wall hernia Musculoskeletal: No acute bone abnormality. IMPRESSION: 1. Recurrent  retroperitoneal soft tissue mass within the retroperitoneum abutting the right anterolateral margin of the S1 vertebral body resulting in malignant destruction of the anterosuperior aspect of the S1 vertebral body and perineural extension into the right S1 neural foramen. Encasement of the proximal right internal iliac artery. 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 2. Mild hepatic steatosis. 3. Surgical changes of sigmoid colectomy. 4.  Aortic Atherosclerosis (ICD10-I70.0). Electronically Signed   By: Worthy Heads M.D.   On: 12/22/2023 22:09   CT Head Wo Contrast Result Date: 12/22/2023 CLINICAL DATA:  Syncope/presyncope, cerebrovascular cause suspected EXAM: CT HEAD WITHOUT CONTRAST TECHNIQUE: Contiguous axial images were obtained from the base of the skull through the vertex without intravenous contrast. RADIATION DOSE REDUCTION: This exam was performed according to the departmental dose-optimization program which includes automated exposure control, adjustment of the mA and/or kV according to patient size and/or use of iterative reconstruction technique. COMPARISON:  None Available. FINDINGS: Brain: No evidence of large-territorial acute infarction. No parenchymal hemorrhage. No mass lesion. No extra-axial collection. No mass effect or midline shift. No hydrocephalus. Basilar cisterns are patent. Vascular: No hyperdense vessel. Skull: No acute fracture or focal lesion. Sinuses/Orbits: Paranasal sinuses and mastoid air cells are clear. The orbits are unremarkable. Other: Left parieto-occipital scalp soft tissue density with calcification likely a sebaceous cyst. IMPRESSION: No acute intracranial abnormality. Electronically Signed   By: Morgane  Naveau M.D.   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   Uncertain etiology but suspicious for malignancy.  It is concerning that there is some type of lymphadenopathy or malignancy going on.  It seems to be near a lot of clips in the retroperitoneum.  Likely related to hysterectomy back in the 1990s. She tells me hysterectomy was due to abnormal fibroids without malignancy.  No clips were used for the rectosigmoid resection for the very early cancer in 2022.    With a stage I rectosigmoid cancer resected 3 years ago, skeptical that this is malignant colorectal cancer.  However that is in the differential. She states she thinks last c scope was after rectosigmoid resection and was normal. Through [facility] and recommend the records be reviewed   Consider medical oncology consultation evaluation and consider IR eval for biopsies of these regions.   Urology consulted and not planning on stent or PCNT acutely at this time   Per evaluation by colorectal surgery - these areas are not amenable to straightforward or easy surgical resection and do not think colorectal/general surgery's involvement overall is going to be helpful   Patient has evidence of liver, pancreas, and spleen cysts that appear to be benign and not new issues.  She has constipation at baseline and recommend ongoing bowel regimen during admission especially in setting of opioid use  FEN: CLD - can advance from surgical standpoint - she is not obstructed, defer diet  changes in light of possible pending procedure ID: none indicated VTE: okay for chemical ppx from surgical standpoint  I reviewed ED provider notes, Consultant urology notes, hospitalist notes, last 24 h vitals and pain scores, last 48 h intake and output, last 24 h labs and trends, and last 24 h imaging results.    LOS: 0 days   Elwin Hammond, [facility] 12/23/2023, 3:26 PM Please see Amion for pager number during day hours 7:00am-4:30pm

## 2023-12-23 NOTE — Plan of Care (Signed)
  Problem: Education: Goal: Knowledge of General Education information will improve Description: Including pain rating scale, medication(s)/side effects and non-pharmacologic comfort measures Outcome: Progressing   Problem: Health Behavior/Discharge Planning: Goal: Ability to manage health-related needs will improve Outcome: Progressing   Problem: Clinical Measurements: Goal: Ability to maintain clinical measurements within normal limits will improve Outcome: Progressing Goal: Will remain free from infection Outcome: Progressing   Problem: Activity: Goal: Risk for activity intolerance will decrease Outcome: Progressing   Problem: Coping: Goal: Level of anxiety will decrease Outcome: Progressing   Problem: Pain Managment: Goal: General experience of comfort will improve and/or be controlled Outcome: Progressing   Problem: Safety: Goal: Ability to remain free from injury will improve Outcome: Progressing   Problem: Skin Integrity: Goal: Risk for impaired skin integrity will decrease Outcome: Progressing

## 2023-12-23 NOTE — Consult Note (Signed)
 Regina Young  02/08/1956 [id number]  CARE TEAM:  PCP: Merlene Laughter, MD (Inactive)  Outpatient Care Team: Patient Care Team: Merlene Laughter, MD (Inactive) as PCP - General (Internal Medicine) Romie Levee, MD as Consulting Physician (General Surgery) Kathi Der, MD as Consulting Physician (Gastroenterology)  Inpatient Treatment Team: Treatment Team:  Eduard Clos, MD Elson Areas, PA-C Read Drivers, RN Arlean Hopping, MD   This patient is a 68 y.o.female who presents today for surgical evaluation at the request of Dr Toniann Fail.   Chief complaint / Reason for evaluation: Abnormal CT scan  67nyear-old woman.  History of abdominal hysterectomy in the 1990s.  Do not know if this was for malignancy or not.  I see Dr. Carmon Sails name in chart who was a Gynecologic Oncologist.  Clips on sacrum & retropeirtoneum.    Patient found to have a rectosigmoid mass at colonoscopy that Dr. Levora Angel at [facility] in 2022.  Consult to see Dr. Maisie Fus.  Repeat flex sig done.  Did not felt to be unable to be resected endoscopically and high-grade dysplasia noted.  Underwent robotic low anterior rectosigmoid resection 02/21/2021 by Dr. Maisie Fus.  Pathology came back consistent with stage I cancer.  CAT scan showing no metastatic disease.  Felt to be a early stage I with no further workup or treatment needed.  [facility] surgery have not seen her since.  Apparently patient been having worsening back pain and right lower extremity pain.  Had a syncopal event.  Had a CAT scan done which shows thickening on the sacral promontory and sacrum encasing ilacs with some possible lytic bone lesions as well.  Right sided hydronephrosis.  Concerning for possible malignancy.  Workup done at [facility] ED.  Medicine consulted.  Medicine wants surgery to comment.   Assessment  Regina Young  68 y.o. 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  Uncertain etiology but suspicious for malignancy.  Plan:  It is concerning that there is some type of lymphadenopathy or malignancy going on.  It seems to be near a lot of clips in the retroperitoneum.  I am presuming that was the time of the hysterectomy back in the 1990s.  No clips were used for the rectosigmoid resection for the very early cancer in 2022.  Do not know if this patient has gotten radiation treatment in her pelvis.  With a stage I rectosigmoid cancer resected 3 years ago, I am skeptical that this is malignant colorectal cancer.  However that is in the differential.  I suspect tissue is going to need to be biopsied to sort this out.  Consider medical oncology consultation for advice to see if there is a way to get IR biopsies of these regions.  Consider consulting urology to see if the right ureter needs to be stented with the hydronephrosis  These areas are not amenable to straightforward or easy surgical resection.  I do not know colorectal/general surgery's involvement overall this is going to be helpful.  I suspect this may require medical oncology & radiation oncology to diagnose and offer treatment options.  Could be discussed at the tumor board.  Patient has evidence of liver, pancreas, and spleen cysts that appear to be benign and not new issues.  Do not know the status of her last colonoscopy.  I am guessing it is through Dr. Veronda Goody with the [place] system.  It would be helpful to know that.  Do not know if you would feel masses need hospitalization admission especially going into the Easter weekend where  treatment and intervention probably limited.  There are concerns about the new hydronephrosis and sensitivity and pain, will defer to medicine that they may have to admit to help sort of out.    I reviewed nursing notes, ED provider notes, last 24 h vitals and pain scores, last 48 h intake and output, last 24 h labs and trends, last 24 h imaging results, and prior surgical resection 2022 with path report and prior CAT scans. . I have reviewed this patient's available data, including medical history, events of note, test results, etc as part of my evaluation.  A significant portion of that time was spent in counseling.  Care during the described time interval was provided by me.  This care required high  level of medical decision making.  12/23/2023  Eddye Goodie, MD, FACS, MASCRS Esophageal, Gastrointestinal & Colorectal Surgery Robotic and Minimally Invasive Surgery  [facility] [address] [place], [place] [address] [phone] Fax [phone] Main  CONTACT INFORMATION: Weekday (9AM-5PM): Call [facility] main office at [phone] Weeknight (5PM-9AM) or Weekend/Holiday: Check EPIC "Web Links" tab & use "AMION" (password " [email or web address]") for General Surgery [facility] coverage  Please, DO NOT use SecureChat  (it is not reliable communication to reach operating surgeons & will lead to a delay in care).   Epic staff messaging available for outptient concerns needing 1-2 business day response.      12/23/2023      Past Medical History:  Diagnosis Date   Cancer of sigmoid colon, Stage 1, pT1pN0, s/p robotic LAR resection 02/21/2021 02/21/2021   Hyperlipidemia     Past Surgical History:  Procedure Laterality Date   ABDOMINAL HYSTERECTOMY  1999   [name], MD   BIOPSY  01/01/2021   Procedure: BIOPSY;  Surgeon: Joyce Nixon, MD;  Location: [facility];  Service: Endoscopy;;   FLEXIBLE SIGMOIDOSCOPY N/A 01/01/2021    Procedure: FLEXIBLE SIGMOIDOSCOPY WITH BIOPSY;  Surgeon: Joyce Nixon, MD;  Location: [facility] END[facility];  Service: Endoscopy;  Laterality: N/A;  IV SEDATION BY SURGEON   XI ROBOTIC ASSISTED LOWER ANTERIOR RESECTION N/A 02/21/2021   Procedure: XI ROBOTIC ASSISTED LOWER ANTERIOR RESECTION;  Surgeon: Joyce Nixon, MD;  Location: [facility] ORS;  Service: General;  Laterality: N/A;    Social History   Socioeconomic History   Marital status: Married    Spouse name: Not on file   Number of children: Not on file   Years of education: Not on file   Highest education level: Not on file  Occupational History   Not on file  Tobacco Use   Smoking status: Every Day    Current packs/day: 0.50    Average packs/day: 0.5 packs/day for 25.0 years (12.5 ttl pk-yrs)    Types: Cigarettes   Smokeless tobacco: Never  Vaping Use   Vaping status: Never Used  Substance and Sexual Activity   Alcohol use: Yes    Comment: occas.   Drug use: Never   Sexual activity: Not on file  Other Topics Concern  Not on file  Social History Narrative   Not on file   Social Drivers of Health   Financial Resource Strain: Not on file  Food Insecurity: Not on file  Transportation Needs: Not on file  Physical Activity: Not on file  Stress: Not on file  Social Connections: Not on file  Intimate Partner Violence: Not on file    History reviewed. No pertinent family history.  No current facility-administered medications for this encounter.   Current Outpatient Medications  Medication Sig Dispense Refill   methocarbamol (ROBAXIN) 500 MG tablet Take 1 tablet (500 mg total) by mouth every 8 (eight) hours as needed for muscle spasms. 30 tablet 0   acetaminophen (TYLENOL) 500 MG tablet Take 500-1,000 mg by mouth every 6 (six) hours as needed (PAIN).     ibuprofen (ADVIL) 200 MG tablet Take 200-400 mg by mouth every 8 (eight) hours as needed (for pain.).     naproxen sodium (ALEVE) 220 MG tablet Take 220 mg by mouth 2 (two)  times daily as needed (pain).     rosuvastatin (CRESTOR) 5 MG tablet Take 5 mg by mouth in the morning. (0800)     traMADol (ULTRAM) 50 MG tablet Take 1 tablet (50 mg total) by mouth every 6 (six) hours as needed for moderate pain. 30 tablet 0     No Known Allergies   BP (!) 157/81   Pulse 81   Temp 98.1 F (36.7 C) (Oral)   Resp 15   SpO2 95%     Results:   Labs: Results for orders placed or performed during the hospital encounter of 12/22/23 (from the past 48 hours)  Troponin I (High Sensitivity)     Status: None   Collection Time: 12/22/23  7:58 PM  Result Value Ref Range   Troponin I (High Sensitivity) 6 <18 ng/L    Comment: (NOTE) Elevated high sensitivity troponin I (hsTnI) values and significant  changes across serial measurements may suggest ACS but many other  chronic and acute conditions are known to elevate hsTnI results.  Refer to the "Links" section for chest pain algorithms and additional  guidance. Performed at [facility], [address] [place], [place] [address]   Urinalysis, Routine w reflex microscopic -Urine, Clean Catch     Status: Abnormal   Collection Time: 12/23/23 12:04 AM  Result Value Ref Range   Color, Urine YELLOW YELLOW   APPearance CLEAR CLEAR   Specific Gravity, Urine 1.010 1.005 - 1.030   pH 7.0 5.0 - 8.0   Glucose, UA NEGATIVE NEGATIVE mg/dL   Hgb urine dipstick TRACE (A) NEGATIVE   Bilirubin Urine NEGATIVE NEGATIVE   Ketones, ur NEGATIVE NEGATIVE mg/dL   Protein, ur NEGATIVE NEGATIVE mg/dL   Nitrite NEGATIVE NEGATIVE   Leukocytes,Ua NEGATIVE NEGATIVE    Comment: Performed at [facility], [address] [place], [place] [address]  Urinalysis, Microscopic (reflex)     Status: Abnormal   Collection Time: 12/23/23 12:04 AM  Result Value Ref Range   RBC / HPF 0-5 0 - 5 RBC/hpf   WBC, UA 0-5 0 - 5 WBC/hpf   Bacteria, UA RARE (A) NONE SEEN   Squamous Epithelial / HPF 0-5 0 - 5 /HPF    Comment: Performed at  [facility], [address] [place], [place] [address]    Imaging / Studies: CT L-SPINE NO CHARGE Result Date: 12/22/2023 CLINICAL DATA:  Recurrent right leg pain, right thigh  pain, right calf pain. History of colon cancer. * Tracking Code: BO * EXAM: CT LUMBAR SPINE WITHOUT CONTRAST TECHNIQUE: Multidetector CT imaging of the lumbar spine was performed without intravenous contrast administration. Multiplanar CT image reconstructions were also generated. RADIATION DOSE REDUCTION: This exam was performed according to the departmental dose-optimization program which includes automated exposure control, adjustment of the mA and/or kV according to patient size and/or use of iterative reconstruction technique. COMPARISON:  Findings are correlated with concurrently performed CT examination the abdomen and pelvis. FINDINGS: Segmentation: 5 lumbar type vertebrae. Alignment: Normal. Vertebrae: There is malignant erosion of the right anterosuperior aspect of the S1 vertebral body, best appreciated on image # 40/17 secondary superior endplate fracture of S1 with mild loss of height anteriorly. No other fracture identified. Remaining vertebral body height is preserved. Paraspinal and other soft tissues: 1.6 x 3.3 cm infiltrative soft tissue mass is seen along the right anterolateral aspect of S1 demonstrating perineural extension into the right S1 neural foramen without involvement of the thecal sac itself. The mass demonstrates encasement of the proximal right common iliac artery, best seen image # 107/20. The mass is indistinguishable from the adjacent L5-S1 intervertebral disc space though disc space height is preserved. 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 Multiple surgical clips are seen in the region of the mass, several of which are encased by the mass. See complete report for CT examination the abdomen and pelvis. Disc levels: Intervertebral disc heights are preserved.  Spinal canal is widely patent. No significant neuroforaminal narrowing. IMPRESSION: 1. 1.6 x 3.3 cm infiltrative soft tissue mass along the right anterolateral aspect of S1 2. Perineural extension into the right S1 neural foramen without involvement of the thecal sac itself. 3. 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 4. Associated malignant erosion of the right anterosuperior aspect of the S1 vertebral body with mild loss of height anteriorly. 5. The mass is indistinguishable from the adjacent L5-S1 intervertebral disc space though disc space height is preserved. 6. Encasement of the proximal right common iliac artery. 7. These findings would be better assessed with contrast enhanced MRI examination Electronically Signed   By: Helyn Numbers M.D.   On: 12/22/2023 22:18   CT ABDOMEN PELVIS W CONTRAST Result Date: 12/22/2023 CLINICAL DATA:  Abdominal and flank pain, recurrent right leg pain. Colon cancer. * Tracking Code: BO * EXAM: CT ABDOMEN AND PELVIS WITH CONTRAST TECHNIQUE: Multidetector CT imaging of the abdomen and pelvis was performed using the standard protocol following bolus administration of intravenous contrast. RADIATION DOSE REDUCTION: This exam was performed according to the departmental dose-optimization program which includes automated exposure control, adjustment of the mA and/or kV according to patient size and/or use of iterative reconstruction technique. CONTRAST:  96mL OMNIPAQUE IOHEXOL 300 MG/ML  SOLN COMPARISON:  02/03/2023, MRI 06/13/2023 FINDINGS: Lower chest: No acute abnormality. Hepatobiliary: Mild hepatic steatosis. Scattered simple hepatic cysts. No enhancing intrahepatic mass. No intra or extrahepatic biliary ductal dilation. Gallbladder unremarkable. Pancreas: Unremarkable Spleen: Numerous previously identified simple cyst within the spleen have decreased in size. No enhancing intra splenic mass. The spleen is normal in size. No perisplenic fluid collection.  Adrenals/Urinary Tract: The adrenal glands are unremarkable. 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 This is best seen on axial image # 40/2 and sagittal image # 71/10. Interval development of mild associated right renal cortical  atrophy suggesting this represents a mole standing obstruction. Delayed right renal cortical enhancement in keeping with high-grade obstruction. The left kidney is unremarkable. Bladder unremarkable. Stomach/Bowel: Surgical changes of sigmoid colectomy are identified. Stomach, small bowel, and large bowel are otherwise unremarkable there is no evidence of obstruction or focal inflammation. Appendix absent. Numerous surgical clips are seen within the retroperitoneum. Adjacent to these clips is a new infiltrative soft tissue mass measuring at least 4.5 x 1.7 cm at axial image # 38/2 abutting the right anterolateral margin of the S1 vertebral body with malignant destruction of the anterosuperior aspect of the vertebral body best seen on image # 37/2 and perineural extension into the right S1 neural foramen, best seen on image # 36-37/2. The mass is poorly delineated from the adjacent L5-S1 disc space though I suspect this structure is abutted but not directly invaded. Vascular/Lymphatic: Mild aortoiliac atherosclerotic calcification. The previously described recurrence retroperitoneal soft tissue mass abuts and encases the right internal iliac artery proximally at axial image # 38/2 and likely abuts but does not encase the left common iliac vein. The abdominal vasculature is otherwise unremarkable. Reproductive: Status post hysterectomy. No adnexal masses. Other: No abdominal wall hernia Musculoskeletal: No acute bone abnormality. IMPRESSION: 1. Recurrent retroperitoneal soft tissue mass within the retroperitoneum abutting the right anterolateral  margin of the S1 vertebral body resulting in malignant destruction of the anterosuperior aspect of the S1 vertebral body and perineural extension into the right S1 neural foramen. Encasement of the proximal right internal iliac artery. 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 2. Mild hepatic steatosis. 3. Surgical changes of sigmoid colectomy. 4.  Aortic Atherosclerosis (ICD10-I70.0). Electronically Signed   By: Helyn Numbers M.D.   On: 12/22/2023 22:09   CT Head Wo Contrast Result Date: 12/22/2023 CLINICAL DATA:  Syncope/presyncope, cerebrovascular cause suspected EXAM: CT HEAD WITHOUT CONTRAST TECHNIQUE: Contiguous axial images were obtained from the base of the skull through the vertex without intravenous contrast. RADIATION DOSE REDUCTION: This exam was performed according to the departmental dose-optimization program which includes automated exposure control, adjustment of the mA and/or kV according to patient size and/or use of iterative reconstruction technique. COMPARISON:  None Available. FINDINGS: Brain: No evidence of large-territorial acute infarction. No parenchymal hemorrhage. No mass lesion. No extra-axial collection. No mass effect or midline shift. No hydrocephalus. Basilar cisterns are patent. Vascular: No hyperdense vessel. Skull: No acute fracture or focal lesion. Sinuses/Orbits: Paranasal sinuses and mastoid air cells are clear. The orbits are unremarkable. Other: Left parieto-occipital scalp soft tissue density with calcification likely a sebaceous cyst. IMPRESSION: No acute intracranial abnormality. Electronically Signed   By: Tish Frederickson M.D.   On: 12/22/2023 19:44   XR Pelvis 1-2 Views Result Date: 12/15/2023 AP pelvis some degenerative changes of the hips by back laterally with some sclerotic changes in the superior acetabulum no acute fractures  XR Lumbar Spine 2-3 Views Result Date: 12/15/2023 Radiographs of the  lumbar spine demonstrate overall well-maintained alignment some endplate sclerotic changes and some facet arthropathy no acute fractures degenerative changes noted mostly at L5-S1   Medications / Allergies: per chart  Antibiotics: Anti-infectives (From admission, onward)    None         Note: Portions of this report may have been transcribed using voice recognition software. Every effort was made to ensure accuracy; however, inadvertent computerized transcription errors may be present.   Any transcriptional errors that result from this process are unintentional.  Eddye Goodie, MD, FACS, MASCRS Esophageal, Gastrointestinal & Colorectal Surgery Robotic and Minimally Invasive Surgery  [facility] [address] [place], [place] [address] [phone] Fax [phone] Main  CONTACT INFORMATION: Weekday (9AM-5PM): Call [facility] main office at [phone] Weeknight (5PM-9AM) or Weekend/Holiday: Check EPIC "Web Links" tab & use "AMION" (password " [email or web address]") for General Surgery [facility] coverage  Please, DO NOT use SecureChat  (it is not reliable communication to reach operating surgeons & will lead to a delay in care).   Epic staff messaging available for outptient concerns needing 1-2 business day response.       12/23/2023  12:31 AM

## 2023-12-23 NOTE — Consult Note (Addendum)
 Urology Consult Note   Requesting Attending Physician:  Bobette Mo, MD Service Providing Consult: Urology  Consulting Attending: Dr. Mena Goes   Reason for Consult:  r. hydronephrosis  HPI: Regina Young is seen in consultation for reasons noted above at the request of Bobette Mo, MD. Patient presents to emergency department with complaints of back pain, right lower extremity pain, and syncopal events.  During workup patient was found to have recurrent retroperitoneal soft tissue mass abutting the S1 vertebral body with signs of malignant destruction.  There was also encasement of the proximal right internal iliac artery and associated severe right side hydroureteronephrosis. She does not have a history with our practice.   Patient was alert, oriented, and in no distress.  She was very pleasant but understandably overwhelmed and a little emotional receiving the news of the likely recurrence of her cancer.  She was accompanied by her husband and sister.  All questions were answered to their satisfaction  ------------------  Assessment:  68 y.o. female with severe right-sided hydronephrosis d/t malignant obstruction   Recommendations: #right severe hydronephrosis d/t malignant obstruction Renal function preserved.  Trend labs.  Right side has some cortical thinning and has likely been obstructed for some time.  As I was the first one to see her from her care team, we made the shared decision to postpone any invasive procedures until she has an opportunity to get her biopsy and discussed the results/plan with her oncology team. Stent failure from extrinsic compression is as high as 50%. If she needs renal decompression to facilitate chemotherapy, we typically recommend percutaneous nephrostomy placement, but can discuss PCNT v stent in coming weeks.  Patient is amenable.  Urology will follow along peripherally.  Please call with questions  Case and plan discussed with  Dr. Mena Goes  Past Medical History: Past Medical History:  Diagnosis Date   Cancer of sigmoid colon, Stage 1, pT1pN0, s/p robotic LAR resection 02/21/2021 02/21/2021   Hyperlipidemia     Past Surgical History:  Past Surgical History:  Procedure Laterality Date   ABDOMINAL HYSTERECTOMY  1999   Otila Kluver, MD   BIOPSY  01/01/2021   Procedure: BIOPSY;  Surgeon: Romie Levee, MD;  Location: [facility];  Service: Endoscopy;;   FLEXIBLE SIGMOIDOSCOPY N/A 01/01/2021   Procedure: FLEXIBLE SIGMOIDOSCOPY WITH BIOPSY;  Surgeon: Romie Levee, MD;  Location: [facility];  Service: Endoscopy;  Laterality: N/A;  IV SEDATION BY SURGEON   XI ROBOTIC ASSISTED LOWER ANTERIOR RESECTION N/A 02/21/2021   Procedure: XI ROBOTIC ASSISTED LOWER ANTERIOR RESECTION;  Surgeon: Romie Levee, MD;  Location: [facility];  Service: General;  Laterality: N/A;    Medication: Current Facility-Administered Medications  Medication Dose Route Frequency Provider Last Rate Last Admin   acetaminophen (TYLENOL) tablet 650 mg  650 mg Oral Q6H PRN Dow Adolph N, DO   650 mg at 12/23/23 1610   melatonin tablet 5 mg  5 mg Oral QHS PRN Dow Adolph N, DO       oxyCODONE (Oxy IR/ROXICODONE) immediate release tablet 5 mg  5 mg Oral Q6H PRN Dow Adolph N, DO   5 mg at 12/23/23 0532   polyethylene glycol (MIRALAX / GLYCOLAX) packet 17 g  17 g Oral Daily PRN Dow Adolph N, DO       prochlorperazine (COMPAZINE) injection 5 mg  5 mg Intravenous Q6H PRN Darlin Drop, DO        Allergies: No Known Allergies  Social History: Social History   Tobacco  Use   Smoking status: Every Day    Current packs/day: 0.50    Average packs/day: 0.5 packs/day for 25.0 years (12.5 ttl pk-yrs)    Types: Cigarettes   Smokeless tobacco: Never  Vaping Use   Vaping status: Never Used  Substance Use Topics   Alcohol use: Yes    Comment: occas.   Drug use: Never    Family History History reviewed. No pertinent family  history.  Review of Systems  Genitourinary:  Negative for dysuria, flank pain, frequency, hematuria and urgency.     Objective   Vital signs in last 24 hours: BP (!) 152/89 (BP Location: Right Arm)   Pulse 93   Temp 98.6 F (37 C) (Oral)   Resp 18   Ht 5\' 2"  (1.575 m)   Wt 45.8 kg   SpO2 97%   BMI 18.47 kg/m   Physical Exam General: A&O, resting, appropriate HEENT: [place]/AT Pulmonary: Normal work of breathing Cardiovascular: no cyanosis Neuro: Appropriate, no focal neurological deficits  Most Recent Labs: Lab Results  Component Value Date   WBC 6.5 12/23/2023   HGB 12.2 12/23/2023   HCT 38.0 12/23/2023   PLT 281 12/23/2023    Lab Results  Component Value Date   NA 137 12/22/2023   K 3.7 12/22/2023   CL 103 12/22/2023   CO2 23 12/22/2023   BUN 14 12/22/2023   CREATININE 0.79 12/23/2023   CALCIUM 9.0 12/22/2023    No results found for: "INR", "APTT"   Urine Culture: @[id number] (laburin,org,[id number])@   IMAGING: CT L-SPINE NO CHARGE Result Date: 12/22/2023 CLINICAL DATA:  Recurrent right leg pain, right thigh pain, right calf pain. History of colon cancer. * Tracking Code: BO * EXAM: CT LUMBAR SPINE WITHOUT CONTRAST TECHNIQUE: Multidetector CT imaging of the lumbar spine was performed without intravenous contrast administration. Multiplanar CT image reconstructions were also generated. RADIATION DOSE REDUCTION: This exam was performed according to the departmental dose-optimization program which includes automated exposure control, adjustment of the mA and/or kV according to patient size and/or use of iterative reconstruction technique. COMPARISON:  Findings are correlated with concurrently performed CT examination the abdomen and pelvis. FINDINGS: Segmentation: 5 lumbar type vertebrae. Alignment: Normal. Vertebrae: There is malignant erosion of the right anterosuperior aspect of the S1 vertebral body, best appreciated on image # 40/17 secondary superior endplate  fracture of S1 with mild loss of height anteriorly. No other fracture identified. Remaining vertebral body height is preserved. Paraspinal and other soft tissues: 1.6 x 3.3 cm infiltrative soft tissue mass is seen along the right anterolateral aspect of S1 demonstrating perineural extension into the right S1 neural foramen without involvement of the thecal sac itself. The mass demonstrates encasement of the proximal right common iliac artery, best seen image # 107/20. The mass is indistinguishable from the adjacent L5-S1 intervertebral disc space though disc space height is preserved. 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 Multiple surgical clips are seen in the region of the mass, several of which are encased by the mass. See complete report for CT examination the abdomen and pelvis. Disc levels: Intervertebral disc heights are preserved. Spinal canal is widely patent. No significant neuroforaminal narrowing. IMPRESSION: 1. 1.6 x 3.3 cm infiltrative soft tissue mass along the right anterolateral aspect of S1 2. Perineural extension into the right S1 neural foramen without involvement of the thecal sac itself. 3. 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 4. Associated malignant erosion of the right anterosuperior  aspect of the S1 vertebral body with mild loss of height anteriorly. 5. The mass is indistinguishable from the adjacent L5-S1 intervertebral disc space though disc space height is preserved. 6. Encasement of the proximal right common iliac artery. 7. These findings would be better assessed with contrast enhanced MRI examination Electronically Signed   By: Worthy Heads M.D.   On: 12/22/2023 22:18   CT ABDOMEN PELVIS W CONTRAST Result Date: 12/22/2023 CLINICAL DATA:  Abdominal and flank pain, recurrent right leg pain. Colon cancer. * Tracking Code: BO * EXAM: CT ABDOMEN AND PELVIS WITH CONTRAST TECHNIQUE: Multidetector CT imaging of the abdomen and  pelvis was performed using the standard protocol following bolus administration of intravenous contrast. RADIATION DOSE REDUCTION: This exam was performed according to the departmental dose-optimization program which includes automated exposure control, adjustment of the mA and/or kV according to patient size and/or use of iterative reconstruction technique. CONTRAST:  96mL OMNIPAQUE IOHEXOL 300 MG/ML  SOLN COMPARISON:  02/03/2023, MRI 06/13/2023 FINDINGS: Lower chest: No acute abnormality. Hepatobiliary: Mild hepatic steatosis. Scattered simple hepatic cysts. No enhancing intrahepatic mass. No intra or extrahepatic biliary ductal dilation. Gallbladder unremarkable. Pancreas: Unremarkable Spleen: Numerous previously identified simple cyst within the spleen have decreased in size. No enhancing intra splenic mass. The spleen is normal in size. No perisplenic fluid collection. Adrenals/Urinary Tract: The adrenal glands are unremarkable. 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 This is best seen on axial image # 40/2 and sagittal image # 71/10. Interval development of mild associated right renal cortical atrophy suggesting this represents a mole standing obstruction. Delayed right renal cortical enhancement in keeping with high-grade obstruction. The left kidney is unremarkable. Bladder unremarkable. Stomach/Bowel: Surgical changes of sigmoid colectomy are identified. Stomach, small bowel, and large bowel are otherwise unremarkable there is no evidence of obstruction or focal inflammation. Appendix absent. Numerous surgical clips are seen within the retroperitoneum. Adjacent to these clips is a new infiltrative soft tissue mass measuring at least 4.5 x 1.7 cm at axial image # 38/2 abutting the right anterolateral margin of the S1 vertebral body with malignant destruction of the  anterosuperior aspect of the vertebral body best seen on image # 37/2 and perineural extension into the right S1 neural foramen, best seen on image # 36-37/2. The mass is poorly delineated from the adjacent L5-S1 disc space though I suspect this structure is abutted but not directly invaded. Vascular/Lymphatic: Mild aortoiliac atherosclerotic calcification. The previously described recurrence retroperitoneal soft tissue mass abuts and encases the right internal iliac artery proximally at axial image # 38/2 and likely abuts but does not encase the left common iliac vein. The abdominal vasculature is otherwise unremarkable. Reproductive: Status post hysterectomy. No adnexal masses. Other: No abdominal wall hernia Musculoskeletal: No acute bone abnormality. IMPRESSION: 1. Recurrent retroperitoneal soft tissue mass within the retroperitoneum abutting the right anterolateral margin of the S1 vertebral body resulting in malignant destruction of the anterosuperior aspect of the S1 vertebral body and perineural extension into the right S1 neural foramen. Encasement of the proximal right internal iliac artery. 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 2. Mild hepatic steatosis. 3. Surgical changes of sigmoid colectomy. 4.  Aortic Atherosclerosis (ICD10-I70.0). Electronically Signed   By: Worthy Heads M.D.   On: 12/22/2023 22:09   CT Head Wo Contrast Result Date: 12/22/2023 CLINICAL DATA:  Syncope/presyncope, cerebrovascular cause suspected  EXAM: CT HEAD WITHOUT CONTRAST TECHNIQUE: Contiguous axial images were obtained from the base of the skull through the vertex without intravenous contrast. RADIATION DOSE REDUCTION: This exam was performed according to the departmental dose-optimization program which includes automated exposure control, adjustment of the mA and/or kV according to patient size and/or use of iterative reconstruction technique. COMPARISON:   None Available. FINDINGS: Brain: No evidence of large-territorial acute infarction. No parenchymal hemorrhage. No mass lesion. No extra-axial collection. No mass effect or midline shift. No hydrocephalus. Basilar cisterns are patent. Vascular: No hyperdense vessel. Skull: No acute fracture or focal lesion. Sinuses/Orbits: Paranasal sinuses and mastoid air cells are clear. The orbits are unremarkable. Other: Left parieto-occipital scalp soft tissue density with calcification likely a sebaceous cyst. IMPRESSION: No acute intracranial abnormality. Electronically Signed   By: Morgane  Naveau M.D.   On: 12/22/2023 19:44    ------  Alla Ar, NP Pager: [phone]   Please contact the urology consult pager with any further questions/concerns.

## 2023-12-23 NOTE — Progress Notes (Signed)
 Mobility Specialist - Progress Note   12/23/23 0859  Mobility  Activity Ambulated independently in hallway  Level of Assistance Independent  Assistive Device None  Distance Ambulated (ft) 500 ft  Activity Response Tolerated well  Mobility Referral Yes  Mobility visit 1 Mobility  Mobility Specialist Start Time (ACUTE ONLY) 0850  Mobility Specialist Stop Time (ACUTE ONLY) 0859  Mobility Specialist Time Calculation (min) (ACUTE ONLY) 9 min   Pt received in bed and agreeable to mobility. No complaints during session. Pt to bed after session with all needs met.    [facility]

## 2023-12-23 NOTE — ED Notes (Signed)
 [facility] called for transport, No Current ETA ED Nurse aware Called @ 00:34

## 2023-12-24 ENCOUNTER — Telehealth: Payer: Self-pay | Admitting: Pulmonary Disease

## 2023-12-24 ENCOUNTER — Observation Stay (HOSPITAL_BASED_OUTPATIENT_CLINIC_OR_DEPARTMENT_OTHER)

## 2023-12-24 ENCOUNTER — Observation Stay (HOSPITAL_COMMUNITY)

## 2023-12-24 DIAGNOSIS — R55 Syncope and collapse: Secondary | ICD-10-CM

## 2023-12-24 DIAGNOSIS — G96191 Perineural cyst: Secondary | ICD-10-CM | POA: Diagnosis not present

## 2023-12-24 DIAGNOSIS — R1900 Intra-abdominal and pelvic swelling, mass and lump, unspecified site: Secondary | ICD-10-CM | POA: Diagnosis not present

## 2023-12-24 DIAGNOSIS — R609 Edema, unspecified: Secondary | ICD-10-CM | POA: Diagnosis not present

## 2023-12-24 DIAGNOSIS — M545 Low back pain, unspecified: Secondary | ICD-10-CM | POA: Diagnosis not present

## 2023-12-24 LAB — BASIC METABOLIC PANEL WITH GFR
Anion gap: 13 (ref 5–15)
BUN: 20 mg/dL (ref 8–23)
CO2: 24 mmol/L (ref 22–32)
Calcium: 8.9 mg/dL (ref 8.9–10.3)
Chloride: 99 mmol/L (ref 98–111)
Creatinine, Ser: 0.93 mg/dL (ref 0.44–1.00)
GFR, Estimated: >60 mL/min (ref >60)
Glucose, Bld: 80 mg/dL (ref 70–99)
Potassium: 3.5 mmol/L (ref 3.5–5.1)
Sodium: 136 mmol/L (ref 135–145)

## 2023-12-24 LAB — CBC
HCT: 40.8 % (ref 36.0–46.0)
Hemoglobin: 12.7 g/dL (ref 12.0–15.0)
MCH: 29.8 pg (ref 26.0–34.0)
MCHC: 31.1 g/dL (ref 30.0–36.0)
MCV: 95.8 fL (ref 80.0–100.0)
Platelets: 282 10*3/uL (ref 150–400)
RBC: 4.26 MIL/uL (ref 3.87–5.11)
RDW: 13.1 % (ref 11.5–15.5)
WBC: 5.9 10*3/uL (ref 4.0–10.5)
nRBC: 0 % (ref 0.0–0.2)

## 2023-12-24 LAB — ECHOCARDIOGRAM COMPLETE
Height: 62 in
S' Lateral: 2.8 cm
Weight: 1616 [oz_av]

## 2023-12-24 LAB — CEA: CEA: 2.3 ng/mL (ref 0.0–4.7)

## 2023-12-24 MED ORDER — GABAPENTIN 100 MG PO CAPS
100.0000 mg | ORAL_CAPSULE | Freq: Three times a day (TID) | ORAL | Status: DC
  Administered 2023-12-24 – 2023-12-25 (×3): 100 mg via ORAL
  Filled 2023-12-24 (×3): qty 1

## 2023-12-24 MED ORDER — GADOBUTROL 1 MMOL/ML IV SOLN
5.0000 mL | Freq: Once | INTRAVENOUS | Status: AC | PRN
  Administered 2023-12-24: 5 mL via INTRAVENOUS

## 2023-12-24 NOTE — Telephone Encounter (Signed)
 Appointment with Dr. Byrum as soon as possible  Right suprahilar nodule  Patient with colon cancer resected in 2022, has retroperitoneal mass that is not amenable to biopsy  Need for navigational bronchoscopy

## 2023-12-24 NOTE — Progress Notes (Signed)
 Mobility Specialist - Progress Note   12/24/23 0841  Mobility  Activity Ambulated independently in hallway  Level of Assistance Independent  Assistive Device None  Distance Ambulated (ft) 500 ft  Activity Response Tolerated well  Mobility Referral Yes  Mobility visit 1 Mobility  Mobility Specialist Start Time (ACUTE ONLY) 0835  Mobility Specialist Stop Time (ACUTE ONLY) 0841  Mobility Specialist Time Calculation (min) (ACUTE ONLY) 6 min   Pt received in bed and agreeable to mobility. No complaints during session. Pt to bed after session with all needs met.   [facility]

## 2023-12-24 NOTE — Progress Notes (Signed)
 PROGRESS NOTE    Regina Young  OZH:[id number] DOB: [birth year] DOA: 12/22/2023 PCP: Alvie Ax, MD (Inactive)  Chief Complaint  Patient presents with   Leg Pain    right    Brief Narrative:   Regina Young is Regina Young 68 y.o. female with medical history significant of colon cancer, hyperlipidemia,, liver cyst, splenic cyst, history of abdominal hysterectomy who presented to the emergency department after having Regina Young syncopal episode at home.    Assessment & Plan:   Principal Problem:   Retroperitoneal mass Active Problems:   Cancer of sigmoid colon, Stage 1, pT1pN0, s/p robotic LAR resection 02/21/2021   Hyperlipidemia   Hydronephrosis, right   Syncope and collapse   History of abdominal hysterectomy   Low back pain   Hydroureter on right  Retroperitoneal Soft Tissue mass concerning for metastatic cancer CT with recurrent retroperitoneal soft tissue mass within retroperitoneum abutting the R anterolateral margin of the S1 vertebral body resulting in malignant destruction of the anterosuperior aspect of the S1 vertebral body and perineural extension into the R S1 neural foramen.  Encasement of proximal R internal iliac artery.  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  Per IR, Dr. Darylene Epley, nothing amendable to percutaneous biopsy Discussed with Neurosurgery who did not note any biopsy site Will ask surgery If surgery unable, will discuss with pulm regarding the 1.8x1.6 cm soft tissue nodule in the R suprahilar region Appreciate oncology involvement  Trial gabapentin  for nerve pain  Syncope and collapse Echo with normal EF Carotids near normal  Follow orthostatics Telemetry      Hyperlipidemia Continue rosuvastatin  5 mg p.o. daily.     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 No need for urgent procedure at this time. Will continue to monitor electrolytes/GFR.    DVT prophylaxis:  SCD Code Status: full Family Communication: husband at bedside Disposition:   Status is: Observation The patient remains OBS appropriate and will d/c before 2 midnights.   Consultants:  Surgery Neurosurgery Oncology urology  Procedures:  Echo IMPRESSIONS     1. Left ventricular ejection fraction, by estimation, is 65 to 70%. The  left ventricle has normal function. The left ventricle has no regional  wall motion abnormalities.   2. Right ventricular systolic function is normal. The right ventricular  size is normal.   3. Trivial mitral valve regurgitation.   4. The aortic valve is normal in structure. Aortic valve regurgitation is  trivial.   5. The inferior vena cava is normal in size with greater than 50%  respiratory variability, suggesting right atrial pressure of 3 mmHg.   Summary:  Right Carotid: The extracranial vessels were near-normal with only minimal  wall                thickening or plaque.   Left Carotid: The extracranial vessels were near-normal with only minimal  wall               thickening or plaque.   Vertebrals:  Bilateral vertebral arteries demonstrate antegrade flow.  Subclavians: Normal flow hemodynamics were seen in bilateral subclavian               arteries.    Antimicrobials:  Anti-infectives (From admission, onward)    None       Subjective: C/o 7/10 pain Mostly R buttocks, radiating down leg  Objective: Vitals:   12/23/23 2109 12/24/23 0517 12/24/23 1007 12/24/23 1334  BP: (!) 156/82 Regina Aas)  145/87 138/80 (!) 149/77  Pulse: 97 73 73 91  Resp: 18 18 18 18   Temp: 98.7 F (37.1 C) 98.9 F (37.2 C) 98.6 F (37 C) 98.4 F (36.9 C)  TempSrc:   Oral Oral  SpO2: 96% 95% 100% 97%  Weight:      Height:        Intake/Output Summary (Last 24 hours) at 12/24/2023 1550 Last data filed at 12/24/2023 1400 Gross per 24 hour  Intake 470 ml  Output 750 ml  Net -280 ml   Filed Weights   12/23/23 0824  Weight: 45.8 kg     Examination:  General exam: Appears calm and comfortable  Respiratory system: unlabored Cardiovascular system: RRR Gastrointestinal system: Abdomen is nondistended, soft and nontender.  Central nervous system: Alert and oriented. No focal neurological deficits. Extremities: no LEE   Data Reviewed: I have personally reviewed following labs and imaging studies  CBC: Recent Labs  Lab 12/22/23 0727 12/23/23 0501 12/24/23 0800  WBC 10.5 6.5 5.9  NEUTROABS 8.7*  --   --   HGB 13.0 12.2 12.7  HCT 40.2 38.0 40.8  MCV 93.1 93.6 95.8  PLT 335 281 282    Basic Metabolic Panel: Recent Labs  Lab 12/22/23 0727 12/23/23 0501 12/23/23 1117 12/23/23 1752 12/24/23 0800  NA 137  --  138 139 136  K 3.7  --  3.6 4.3 3.5  CL 103  --   --  103 99  CO2 23  --   --  28 24  GLUCOSE 119*  --   --  94 80  BUN 14  --   --  17 20  CREATININE 0.92 0.79  --  0.89 0.93  CALCIUM  9.0  --   --  9.2 8.9  MG  --   --  2.3  --   --   PHOS  --   --  3.2  --   --     GFR: Estimated Creatinine Clearance: 43.6 mL/min (by C-G formula based on SCr of 0.93 mg/dL).  Liver Function Tests: Recent Labs  Lab 12/23/23 1752  AST 17  ALT 13  ALKPHOS 77  BILITOT 0.7  PROT 7.7  ALBUMIN 4.7    CBG: Recent Labs  Lab 12/22/23 0730  GLUCAP 125*     No results found for this or any previous visit (from the past 240 hours).       Radiology Studies: ECHOCARDIOGRAM COMPLETE Result Date: 12/24/2023    ECHOCARDIOGRAM REPORT   Patient Name:   Regina Young Date of Exam: 12/24/2023 Medical Rec #:  [id number]     Height:       62.0 in Accession #:    [id number]    Weight:       101.0 lb Date of Birth:  01-Jun-1956     BSA:          1.430 m Patient Age:    67 years      BP:           145/87 mmHg Patient Gender: F             HR:           100 bpm. Exam Location:  Inpatient Procedure: 2D Echo, Cardiac Doppler and Color Doppler (Both Spectral and Color            Flow Doppler were utilized during procedure).  Indications:    Syncope R55  History:  Patient has no prior history of Echocardiogram examinations.                 Risk Factors:Dyslipidemia.  Sonographer:    Hersey Lorenzo RDCS Referring Phys: [phone] DAVID MANUEL ORTIZ IMPRESSIONS  1. Left ventricular ejection fraction, by estimation, is 65 to 70%. The left ventricle has normal function. The left ventricle has no regional wall motion abnormalities.  2. Right ventricular systolic function is normal. The right ventricular size is normal.  3. Trivial mitral valve regurgitation.  4. The aortic valve is normal in structure. Aortic valve regurgitation is trivial.  5. The inferior vena cava is normal in size with greater than 50% respiratory variability, suggesting right atrial pressure of 3 mmHg. FINDINGS  Left Ventricle: Left ventricular ejection fraction, by estimation, is 65 to 70%. The left ventricle has normal function. The left ventricle has no regional wall motion abnormalities. The left ventricular internal cavity size was normal in size. There is  no left ventricular hypertrophy. Right Ventricle: The right ventricular size is normal. Right vetricular wall thickness was not assessed. Right ventricular systolic function is normal. Left Atrium: Left atrial size was normal in size. Right Atrium: Right atrial size was normal in size. Pericardium: There is no evidence of pericardial effusion. Mitral Valve: There is mild thickening of the mitral valve leaflet(s). Trivial mitral valve regurgitation. Tricuspid Valve: The tricuspid valve is normal in structure. Tricuspid valve regurgitation is mild. Aortic Valve: The aortic valve is normal in structure. Aortic valve regurgitation is trivial. Pulmonic Valve: The pulmonic valve was not well visualized. Pulmonic valve regurgitation is not visualized. No evidence of pulmonic stenosis. Aorta: The aortic root and ascending aorta are structurally normal, with no evidence of dilitation. Venous: The inferior vena cava is  normal in size with greater than 50% respiratory variability, suggesting right atrial pressure of 3 mmHg. IAS/Shunts: No atrial level shunt detected by color flow Doppler.  LEFT VENTRICLE PLAX 2D LVIDd:         4.70 cm   Diastology LVIDs:         2.80 cm   LV e' lateral: 8.49 cm/s LV PW:         1.00 cm LV IVS:        1.00 cm LVOT diam:     2.20 cm LV SV:         62 LV SV Index:   43 LVOT Area:     3.80 cm  RIGHT VENTRICLE             IVC RV S prime:     11.40 cm/s  IVC diam: 1.00 cm TAPSE (M-mode): 1.4 cm LEFT ATRIUM           Index LA diam:      2.40 cm 1.68 cm/m LA Vol (A4C): 9.9 ml  6.91 ml/m  AORTIC VALVE LVOT Vmax:   102.00 cm/s LVOT Vmean:  69.600 cm/s LVOT VTI:    0.163 m  AORTA Ao Root diam: 2.80 cm Ao Asc diam:  3.00 cm  SHUNTS Systemic VTI:  0.16 m Systemic Diam: 2.20 cm Ola Berger MD Electronically signed by Ola Berger MD Signature Date/Time: 12/24/2023/3:19:36 PM    Final    VAS US  CAROTID Result Date: 12/24/2023 Carotid Arterial Duplex Study Patient Name:  ASTARIA NANEZ  Date of Exam:   12/24/2023 Medical Rec #: [id number]      Accession #:    [id number] Date of Birth: Jun 29, 1956      Patient  Gender: F Patient Age:   52 years Exam Location:  [facility] Procedure:      VAS US  CAROTID Referring Phys: DAVID ORTIZ --------------------------------------------------------------------------------  Indications:       Syncope. Risk Factors:      Hyperlipidemia, current smoker. Limitations        Today's exam was limited due to Seab Axel VERY high bifurcation of                    the carotid bilaterally. Comparison Study:  No previous exams Performing Technologist: Jody Hill RVT, RDMS  Examination Guidelines: Tyreona Panjwani complete evaluation includes B-mode imaging, spectral Doppler, color Doppler, and power Doppler as needed of all accessible portions of each vessel. Bilateral testing is considered an integral part of Emile Ringgenberg complete examination. Limited examinations for reoccurring indications may be performed as noted.   Right Carotid Findings: +----------+--------+--------+--------+------------------+------------------+           PSV cm/sEDV cm/sStenosisPlaque DescriptionComments           +----------+--------+--------+--------+------------------+------------------+ CCA Prox  73      16                                intimal thickening +----------+--------+--------+--------+------------------+------------------+ CCA Distal91      27                                intimal thickening +----------+--------+--------+--------+------------------+------------------+ ICA Prox  59      23                                                   +----------+--------+--------+--------+------------------+------------------+ ICA Distal82      26                                tortuous           +----------+--------+--------+--------+------------------+------------------+ ECA       78      16                                                   +----------+--------+--------+--------+------------------+------------------+ +----------+--------+-------+----------------+-------------------+           PSV cm/sEDV cmsDescribe        Arm Pressure (mmHG) +----------+--------+-------+----------------+-------------------+ [id number]             Multiphasic, WNL                    +----------+--------+-------+----------------+-------------------+ +---------+--------+--+--------+--+---------+ VertebralPSV cm/s43EDV cm/s11Antegrade +---------+--------+--+--------+--+---------+  Left Carotid Findings: +----------+--------+--------+--------+------------------+--------+           PSV cm/sEDV cm/sStenosisPlaque DescriptionComments +----------+--------+--------+--------+------------------+--------+ CCA Prox  83      12                                         +----------+--------+--------+--------+------------------+--------+ CCA Distal63      23                                          +----------+--------+--------+--------+------------------+--------+  ICA Prox  73      29                                         +----------+--------+--------+--------+------------------+--------+ ICA Distal77      26                                         +----------+--------+--------+--------+------------------+--------+ ECA       84      21                                         +----------+--------+--------+--------+------------------+--------+ +----------+--------+--------+----------------+-------------------+           PSV cm/sEDV cm/sDescribe        Arm Pressure (mmHG) +----------+--------+--------+----------------+-------------------+ [id number]              Multiphasic, WNL                    +----------+--------+--------+----------------+-------------------+ +---------+--------+--+--------+--+---------+ VertebralPSV cm/s44EDV cm/s15Antegrade +---------+--------+--+--------+--+---------+   Summary: Right Carotid: The extracranial vessels were near-normal with only minimal wall                thickening or plaque. Left Carotid: The extracranial vessels were near-normal with only minimal wall               thickening or plaque. Vertebrals:  Bilateral vertebral arteries demonstrate antegrade flow. Subclavians: Normal flow hemodynamics were seen in bilateral subclavian              arteries. *See table(s) above for measurements and observations.     Preliminary    MR Lumbar Spine W Wo Contrast Result Date: 12/24/2023 CLINICAL DATA:  68 year old female with low back pain, rectal cancer, invasive presacral soft tissue mass at the sacral promontory on CT. EXAM: MRI LUMBAR SPINE WITHOUT AND WITH CONTRAST TECHNIQUE: Multiplanar and multiecho pulse sequences of the lumbar spine were obtained without and with intravenous contrast. CONTRAST:  5mL GADAVIST  GADOBUTROL  1 MMOL/ML IV SOLN COMPARISON:  CT Abdomen and Pelvis, lumbar spine CT 12/22/2023. FINDINGS: Segmentation:  Normal on the comparison CT. There is Brenetta Penny vestigial S1-S2 disc space. Alignment: Maintained lumbar lordosis. Maintained alignment of the visible sacral segments. Vertebrae: Normal background bone marrow signal, and no acute or suspicious bone lesion identified in the lumbar or visible lower thoracic levels. However, extensive tumor infiltration of the sacrum at the S1 level, throughout the central sacrum there and in the sacral ala right greater than left (series 9, images 35 through 40). This appears associated with indistinct presacral soft tissue. The affected bone is heterogeneously enhancing and edematous. And there is epidural tumor throughout the right S1 neural foramen, inseparable from the nerve there. Left S1 neural foramen relatively spared. No sacral spinal canal involvement. 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 Conus medullaris and cauda equina: Conus extends to the L1-L2 level. No lower spinal cord or conus signal abnormality. Normal cauda equina nerve roots. No abnormal enhancement above the right S1 nerve. No lumbar dural thickening or enhancement. Paraspinal and other soft tissues: Severe right hydronephrosis and hydroureter again noted. Disc levels: No age advanced lower thoracic or lumbar spine degeneration. Capacious spinal canal throughout those levels. Malignant impingement  of the right S1 nerve as above. IMPRESSION: 1. 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 2. No other tumor or metastatic disease identified in the Lumbar Spine. 3. Ongoing Right Hydronephrosis and hydroureter. Electronically Signed   By: Marlise Simpers M.D.   On: 12/24/2023 07:50   CT CHEST W CONTRAST Result Date: 12/24/2023 CLINICAL DATA:  Rectal cancer. Evaluate for metastases. Restaging. * Tracking Code: BO * EXAM: CT CHEST WITH CONTRAST TECHNIQUE: Multidetector CT imaging of the chest was performed  during intravenous contrast administration. RADIATION DOSE REDUCTION: This exam was performed according to the departmental dose-optimization program which includes automated exposure control, adjustment of the mA and/or kV according to patient size and/or use of iterative reconstruction technique. CONTRAST:  75mL OMNIPAQUE  IOHEXOL  300 MG/ML  SOLN COMPARISON:  05/21/2022 FINDINGS: Cardiovascular: The heart size is normal. No substantial pericardial effusion. Ascending thoracic aorta measures 3.2 cm diameter. Mediastinum/Nodes: No mediastinal lymphadenopathy. 1.8 x 1.6 cm soft tissue nodule is identified in the right suprahilar lung (axial soft tissue window 49 of series 2 and axial lung window 48 of series 8) compatible with abnormal hilar lymph node or central right upper lobe pulmonary nodule. No left hilar lymphadenopathy. The esophagus has normal imaging features. There is no axillary lymphadenopathy. Lungs/Pleura: Centrilobular and paraseptal emphysema evident. Biapical pleuroparenchymal scarring evident. As noted above, there is Jashayla Glatfelter 1.8 cm nodular lesion in the right suprahilar region (49/8). No other suspicious pulmonary nodule or mass. 3 mm peripheral left upper lobe nodule on 58/8 is stable since the previous study. Subtle 6 mm ground-glass opacity in the left upper lobe on 61/8 is unchanged. No pleural effusion. Upper Abdomen: Scattered tiny hypodensities in the liver parenchyma are too small to characterize but are statistically most likely benign. No followup imaging is recommended. Right-sided hydronephrosis seen on today's study was better characterized on yesterday's dedicated abdomen and pelvis CT. High density material in the dilated right intrarenal collecting system and renal pelvis is probably residual contrast from the previous CT. Musculoskeletal: No worrisome lytic or sclerotic osseous abnormality. IMPRESSION: 1. 1.8 x 1.6 cm soft tissue nodule in the right suprahilar region compatible with  abnormal hilar lymph node or central right upper lobe pulmonary nodule. Imaging features are suspicious for metastatic disease although synchronous primary bronchogenic neoplasm is also Ashtynn Berke consideration. PET-CT may prove helpful to further evaluate as clinically warranted. 2. No other evidence for metastatic disease in the chest. 3. Right-sided hydronephrosis seen on today's study was better characterized on yesterday's dedicated abdomen and pelvis CT. 4.  Emphysema. ([id number]) Electronically Signed   By: Donnal Fusi M.D.   On: 12/24/2023 06:43   CT L-SPINE NO CHARGE Result Date: 12/22/2023 CLINICAL DATA:  Recurrent right leg pain, right thigh pain, right calf pain. History of colon cancer. * Tracking Code: BO * EXAM: CT LUMBAR SPINE WITHOUT CONTRAST TECHNIQUE: Multidetector CT imaging of the lumbar spine was performed without intravenous contrast administration. Multiplanar CT image reconstructions were also generated. RADIATION DOSE REDUCTION: This exam was performed according to the departmental dose-optimization program which includes automated exposure control, adjustment of the mA and/or kV according to patient size and/or use of iterative reconstruction technique. COMPARISON:  Findings are correlated with concurrently performed CT examination the abdomen and pelvis. FINDINGS: Segmentation: 5 lumbar type vertebrae. Alignment: Normal. Vertebrae: There is malignant erosion of the right anterosuperior aspect of the S1 vertebral body, best appreciated on  image # 40/17 secondary superior endplate fracture of S1 with mild loss of height anteriorly. No other fracture identified. Remaining vertebral body height is preserved. Paraspinal and other soft tissues: 1.6 x 3.3 cm infiltrative soft tissue mass is seen along the right anterolateral aspect of S1 demonstrating perineural extension into the right S1 neural foramen without involvement of the thecal sac itself. The mass demonstrates encasement of the proximal  right common iliac artery, best seen image # 107/20. The mass is indistinguishable from the adjacent L5-S1 intervertebral disc space though disc space height is preserved. 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 Multiple surgical clips are seen in the region of the mass, several of which are encased by the mass. See complete report for CT examination the abdomen and pelvis. Disc levels: Intervertebral disc heights are preserved. Spinal canal is widely patent. No significant neuroforaminal narrowing. IMPRESSION: 1. 1.6 x 3.3 cm infiltrative soft tissue mass along the right anterolateral aspect of S1 2. Perineural extension into the right S1 neural foramen without involvement of the thecal sac itself. 3. 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 4. Associated malignant erosion of the right anterosuperior aspect of the S1 vertebral body with mild loss of height anteriorly. 5. The mass is indistinguishable from the adjacent L5-S1 intervertebral disc space though disc space height is preserved. 6. Encasement of the proximal right common iliac artery. 7. These findings would be better assessed with contrast enhanced MRI examination Electronically Signed   By: Worthy Heads M.D.   On: 12/22/2023 22:18   CT ABDOMEN PELVIS W CONTRAST Result Date: 12/22/2023 CLINICAL DATA:  Abdominal and flank pain, recurrent right leg pain. Colon cancer. * Tracking Code: BO * EXAM: CT ABDOMEN AND PELVIS WITH CONTRAST TECHNIQUE: Multidetector CT imaging of the abdomen and pelvis was performed using the standard protocol following bolus administration of intravenous contrast. RADIATION DOSE REDUCTION: This exam was performed according to the departmental dose-optimization program which includes automated exposure control, adjustment of the mA and/or kV according to patient size and/or use of iterative reconstruction technique. CONTRAST:  96mL OMNIPAQUE  IOHEXOL  300 MG/ML  SOLN COMPARISON:   02/03/2023, MRI 06/13/2023 FINDINGS: Lower chest: No acute abnormality. Hepatobiliary: Mild hepatic steatosis. Scattered simple hepatic cysts. No enhancing intrahepatic mass. No intra or extrahepatic biliary ductal dilation. Gallbladder unremarkable. Pancreas: Unremarkable Spleen: Numerous previously identified simple cyst within the spleen have decreased in size. No enhancing intra splenic mass. The spleen is normal in size. No perisplenic fluid collection. Adrenals/Urinary Tract: The adrenal glands are unremarkable. 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 This is best seen on axial image # 40/2 and sagittal image # 71/10. Interval development of mild associated right renal cortical atrophy suggesting this represents Kiondre Grenz mole standing obstruction. Delayed right renal cortical enhancement in keeping with high-grade obstruction. The left kidney is unremarkable. Bladder unremarkable. Stomach/Bowel: Surgical changes of sigmoid colectomy are identified. Stomach, small bowel, and large bowel are otherwise unremarkable there is no evidence of obstruction or focal inflammation. Appendix absent. Numerous surgical clips are seen within the retroperitoneum. Adjacent to these clips is Gennett Garcia new infiltrative soft tissue mass measuring at least 4.5 x 1.7 cm at axial image # 38/2 abutting the right anterolateral margin of the S1 vertebral body with malignant destruction of the anterosuperior aspect of the vertebral body best seen on image # 37/2 and perineural extension  into the right S1 neural foramen, best seen on image # 36-37/2. The mass is poorly delineated from the adjacent L5-S1 disc space though I suspect this structure is abutted but not directly invaded. Vascular/Lymphatic: Mild aortoiliac atherosclerotic calcification. The previously described recurrence retroperitoneal soft tissue  mass abuts and encases the right internal iliac artery proximally at axial image # 38/2 and likely abuts but does not encase the left common iliac vein. The abdominal vasculature is otherwise unremarkable. Reproductive: Status post hysterectomy. No adnexal masses. Other: No abdominal wall hernia Musculoskeletal: No acute bone abnormality. IMPRESSION: 1. Recurrent retroperitoneal soft tissue mass within the retroperitoneum abutting the right anterolateral margin of the S1 vertebral body resulting in malignant destruction of the anterosuperior aspect of the S1 vertebral body and perineural extension into the right S1 neural foramen. Encasement of the proximal right internal iliac artery. 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 2. Mild hepatic steatosis. 3. Surgical changes of sigmoid colectomy. 4.  Aortic Atherosclerosis (ICD10-I70.0). Electronically Signed   By: Worthy Heads M.D.   On: 12/22/2023 22:09   CT Head Wo Contrast Result Date: 12/22/2023 CLINICAL DATA:  Syncope/presyncope, cerebrovascular cause suspected EXAM: CT HEAD WITHOUT CONTRAST TECHNIQUE: Contiguous axial images were obtained from the base of the skull through the vertex without intravenous contrast. RADIATION DOSE REDUCTION: This exam was performed according to the departmental dose-optimization program which includes automated exposure control, adjustment of the mA and/or kV according to patient size and/or use of iterative reconstruction technique. COMPARISON:  None Available. FINDINGS: Brain: No evidence of large-territorial acute infarction. No parenchymal hemorrhage. No mass lesion. No extra-axial collection. No mass effect or midline shift. No hydrocephalus. Basilar cisterns are patent. Vascular: No hyperdense vessel. Skull: No acute fracture or focal lesion. Sinuses/Orbits: Paranasal sinuses and mastoid air cells are clear. The orbits are unremarkable. Other: Left  parieto-occipital scalp soft tissue density with calcification likely Xylia Scherger sebaceous cyst. IMPRESSION: No acute intracranial abnormality. Electronically Signed   By: Morgane  Naveau M.D.   On: 12/22/2023 19:44        Scheduled Meds:  calcium  citrate  1 tablet Oral Q1200   cyanocobalamin   1,000 mcg Oral Daily   gabapentin   100 mg Oral TID   pantoprazole   40 mg Oral Daily   rosuvastatin   5 mg Oral Daily   senna-docusate  1 tablet Oral BID   Continuous Infusions:   LOS: 0 days    Time spent: over 30 min    Donnetta Gains, MD [facility]   To contact the attending provider between 7A-7P or the covering provider during after hours 7P-7A, please log into the web site [email or web address] and access using universal [place] password for that web site. If you do not have the password, please call the hospital operator.  12/24/2023, 3:50 PM

## 2023-12-24 NOTE — Plan of Care (Signed)

## 2023-12-24 NOTE — Progress Notes (Signed)
 Carotid duplex has been completed.  Results can be found under chart review under CV PROC. 12/24/2023 4:26 PM Emil Klassen RVT, RDMS

## 2023-12-24 NOTE — TOC Initial Note (Signed)
 Transition of Care [facility]) - Initial/Assessment Note    Patient Details  Name: Regina Young MRN: [id number] Date of Birth: [birth year]  Transition of [facility]) CM/SW Contact:    Regina Ream, RN Phone Number: 12/24/2023, 4:01 PM  Clinical Narrative:                 TOC for d/c planning; spoke w/ pt and her sister in room; pt says she lives at home w/ her spouse; she plans to return at d/c; pt identified POC Regina Young (spouse) [phone]; he will provide transportation; she verified insurance/PCP; pt denied SDOH risks; pt says she does not have DME, HH services, or home oxygen; awaiting PT eval; TOC is following.  Expected Discharge Plan: Home/Self Care Barriers to Discharge: Continued Medical Work up   Patient Goals and CMS Choice Patient states their goals for this hospitalization and ongoing recovery are:: home CMS Medicare.gov Compare Post Acute Care list provided to:: Patient   [employer] ownership interest in [facility].provided to:: Patient    Expected Discharge Plan and Services   Discharge Planning Services: CM Consult   Living arrangements for the past 2 months: Single Family Home                                      Prior Living Arrangements/Services Living arrangements for the past 2 months: Single Family Home Lives with:: Spouse Patient language and need for interpreter reviewed:: Yes Do you feel safe going back to the place where you live?: Yes      Need for Family Participation in Patient Care: Yes (Comment) Care giver support system in place?: Yes (comment) Current home services:  (n/a)    Activities of Daily Living   ADL Screening (condition at time of admission) Independently performs ADLs?: Yes (appropriate for developmental age) Is the patient deaf or have difficulty hearing?: No Does the patient have difficulty seeing, even when wearing glasses/contacts?: No (uses eyeglasses) Does the patient have difficulty  concentrating, remembering, or making decisions?: No  Permission Sought/Granted Permission sought to share information with : Case Manager Permission granted to share information with : Yes, Verbal Permission Granted  Share Information with NAME: Case Manager     Permission granted to share info w Relationship: Regina Young (spouse) [phone]     Emotional Assessment Appearance:: Appears stated age Attitude/Demeanor/Rapport: Gracious Affect (typically observed): Accepting Orientation: : Oriented to Self, Oriented to Place, Oriented to  Time, Oriented to Situation Alcohol / Substance Use: Not Applicable Psych Involvement: No (comment)  Admission diagnosis:  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] Hydronephrosis of right kidney [N13.30] Right lower quadrant abdominal pain [R10.31] Syncope, unspecified syncope type [R55] 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 [facility]) [M84.50XA, C79.51] Patient Active Problem List   Diagnosis Date Noted   Hydronephrosis, right 12/23/2023   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   Syncope and collapse 12/23/2023   History of abdominal hysterectomy 12/23/2023   Liver cyst 12/23/2023   Splenic cyst 12/23/2023   Personal history of colon cancer - Stage 1 12/23/2023   Low back pain 12/23/2023   Hydroureter on right 12/23/2023   Hyperlipidemia    Cancer of sigmoid colon, Stage 1, pT1pN0, s/p robotic LAR resection 02/21/2021 02/21/2021   PCP:  Alvie Ax, MD (Inactive) Pharmacy:   [facility] [id number] - [place], [place] - [address] [place] [address] Phone: [phone] Fax: [phone]     Social Drivers of Health (SDOH) Social History: SDOH Screenings   Food Insecurity: No Food Insecurity (12/24/2023)  Housing: Low Risk  (12/24/2023)  Transportation Needs: No Transportation Needs (12/24/2023)  Utilities: Not At Risk (12/24/2023)  Social Connections: Moderately  Isolated (12/23/2023)  Tobacco Use: High Risk (12/22/2023)   SDOH Interventions: Food Insecurity Interventions: Intervention Not Indicated, Inpatient TOC Housing Interventions: Intervention Not Indicated, Inpatient TOC Transportation Interventions: Intervention Not Indicated, Inpatient TOC Utilities Interventions: Intervention Not Indicated, Inpatient TOC   Readmission Risk Interventions     No data to display

## 2023-12-24 NOTE — Progress Notes (Signed)
  Echocardiogram 2D Echocardiogram has been performed.  Fain Home RDCS 12/24/2023, 11:27 AM

## 2023-12-24 NOTE — Progress Notes (Signed)
 Patient ID: Regina Young, female   DOB: 1955-09-20, 68 y.o.   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 Elvan Hamel, MD, FACS General, Bariatric, & Minimally Invasive Surgery [facility],  [facility]

## 2023-12-24 NOTE — Care Management Obs Status (Signed)
 MEDICARE OBSERVATION STATUS NOTIFICATION   Patient Details  Name: Regina Young MRN: [id number] Date of Birth: Apr 28, 1956   Medicare Observation Status Notification Given:  Yes    Levie Ream, RN 12/24/2023, 12:33 PM

## 2023-12-25 DIAGNOSIS — R1900 Intra-abdominal and pelvic swelling, mass and lump, unspecified site: Secondary | ICD-10-CM | POA: Diagnosis not present

## 2023-12-25 LAB — COMPREHENSIVE METABOLIC PANEL WITH GFR
ALT: 12 U/L (ref 0–44)
AST: 15 U/L (ref 15–41)
Albumin: 3.9 g/dL (ref 3.5–5.0)
Alkaline Phosphatase: 65 U/L (ref 38–126)
Anion gap: 11 (ref 5–15)
BUN: 26 mg/dL — ABNORMAL HIGH (ref 8–23)
CO2: 24 mmol/L (ref 22–32)
Calcium: 9 mg/dL (ref 8.9–10.3)
Chloride: 103 mmol/L (ref 98–111)
Creatinine, Ser: 1.06 mg/dL — ABNORMAL HIGH (ref 0.44–1.00)
GFR, Estimated: 58 mL/min — ABNORMAL LOW (ref >60)
Glucose, Bld: 86 mg/dL (ref 70–99)
Potassium: 3.8 mmol/L (ref 3.5–5.1)
Sodium: 138 mmol/L (ref 135–145)
Total Bilirubin: 0.7 mg/dL (ref 0.0–1.2)
Total Protein: 6.5 g/dL (ref 6.5–8.1)

## 2023-12-25 LAB — CBC WITH DIFFERENTIAL/PLATELET
Abs Immature Granulocytes: 0.01 10*3/uL (ref 0.00–0.07)
Basophils Absolute: 0 10*3/uL (ref 0.0–0.1)
Basophils Relative: 0 %
Eosinophils Absolute: 0.1 10*3/uL (ref 0.0–0.5)
Eosinophils Relative: 2 %
HCT: 39.4 % (ref 36.0–46.0)
Hemoglobin: 12.4 g/dL (ref 12.0–15.0)
Immature Granulocytes: 0 %
Lymphocytes Relative: 28 %
Lymphs Abs: 1.8 10*3/uL (ref 0.7–4.0)
MCH: 30.2 pg (ref 26.0–34.0)
MCHC: 31.5 g/dL (ref 30.0–36.0)
MCV: 95.9 fL (ref 80.0–100.0)
Monocytes Absolute: 0.5 10*3/uL (ref 0.1–1.0)
Monocytes Relative: 8 %
Neutro Abs: 3.9 10*3/uL (ref 1.7–7.7)
Neutrophils Relative %: 62 %
Platelets: 333 10*3/uL (ref 150–400)
RBC: 4.11 MIL/uL (ref 3.87–5.11)
RDW: 12.8 % (ref 11.5–15.5)
WBC: 6.4 10*3/uL (ref 4.0–10.5)
nRBC: 0 % (ref 0.0–0.2)

## 2023-12-25 LAB — MAGNESIUM: Magnesium: 2 mg/dL (ref 1.7–2.4)

## 2023-12-25 LAB — PHOSPHORUS: Phosphorus: 4.1 mg/dL (ref 2.5–4.6)

## 2023-12-25 MED ORDER — GABAPENTIN 100 MG PO CAPS
300.0000 mg | ORAL_CAPSULE | Freq: Three times a day (TID) | ORAL | Status: DC
  Administered 2023-12-25 – 2023-12-26 (×4): 300 mg via ORAL
  Filled 2023-12-25 (×4): qty 3

## 2023-12-25 MED ORDER — OXYCODONE HCL 5 MG PO TABS: 5.0000 mg | ORAL_TABLET | Freq: Four times a day (QID) | ORAL | Status: DC | PRN

## 2023-12-25 MED ORDER — DEXAMETHASONE 4 MG PO TABS
4.0000 mg | ORAL_TABLET | Freq: Every day | ORAL | Status: DC
  Administered 2023-12-25 – 2023-12-26 (×2): 4 mg via ORAL
  Filled 2023-12-25 (×2): qty 1

## 2023-12-25 NOTE — Progress Notes (Signed)
 PT Cancellation Note  Patient Details Name: Climmie Cronce MRN: [id number] DOB: 20-Aug-1956   Cancelled Treatment:     PT order received but eval deferred.  Pt is IND in room and ambulating in halls unassisted.  Pt and spouse report no current PT needs and hoping for dc soon.  PT service to sign off at this time.   Janylah Belgrave 12/25/2023, 8:32 AM

## 2023-12-25 NOTE — Progress Notes (Signed)
 PROGRESS NOTE    Regina Young  ZOX:[id number] DOB: February 28, 1956 DOA: 12/22/2023 PCP: Alvie Ax, MD (Inactive)  Chief Complaint  Patient presents with   Leg Pain    right    Brief Narrative:   Regina Young is Regina Young 68 y.o. female with medical history significant of colon cancer, hyperlipidemia,, liver cyst, splenic cyst, history of abdominal hysterectomy who presented to the emergency department after having Regina Young syncopal episode at home.    Assessment & Plan:   Principal Problem:   Retroperitoneal mass Active Problems:   Cancer of sigmoid colon, Stage 1, pT1pN0, s/p robotic LAR resection 02/21/2021   Hyperlipidemia   Hydronephrosis, right   Syncope and collapse   History of abdominal hysterectomy   Low back pain   Hydroureter on right  Retroperitoneal Soft Tissue mass concerning for metastatic cancer CT with recurrent retroperitoneal soft tissue mass within retroperitoneum abutting the R anterolateral margin of the S1 vertebral body resulting in malignant destruction of the anterosuperior aspect of the S1 vertebral body and perineural extension into the R S1 neural foramen.  Encasement of proximal R internal iliac artery.  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  Per IR, Dr. Darylene Epley, nothing amendable to percutaneous biopsy Discussed with Neurosurgery who did not note any biopsy site Surgery also did not note Regina Young role for them to biopsy Pulm planning outpatient eval of right suprahilar nodule via navigational bronch Appreciate oncology involvement  Trial gabapentin  for nerve pain - uptitrate Trial dexamethasone   Syncope and collapse Echo with normal EF Carotids near normal  Follow orthostatics - negative Telemetry      Hyperlipidemia Continue rosuvastatin  5 mg p.o. daily.     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 No need for urgent procedure at this time. Will continue to monitor  electrolytes/GFR.    DVT prophylaxis: SCD Code Status: full Family Communication: husband at bedside Disposition:   Status is: Observation The patient remains OBS appropriate and will d/c before 2 midnights.   Consultants:  Surgery Neurosurgery Oncology urology  Procedures:  Echo IMPRESSIONS     1. Left ventricular ejection fraction, by estimation, is 65 to 70%. The  left ventricle has normal function. The left ventricle has no regional  wall motion abnormalities.   2. Right ventricular systolic function is normal. The right ventricular  size is normal.   3. Trivial mitral valve regurgitation.   4. The aortic valve is normal in structure. Aortic valve regurgitation is  trivial.   5. The inferior vena cava is normal in size with greater than 50%  respiratory variability, suggesting right atrial pressure of 3 mmHg.   Summary:  Right Carotid: The extracranial vessels were near-normal with only minimal  wall                thickening or plaque.   Left Carotid: The extracranial vessels were near-normal with only minimal  wall               thickening or plaque.   Vertebrals:  Bilateral vertebral arteries demonstrate antegrade flow.  Subclavians: Normal flow hemodynamics were seen in bilateral subclavian               arteries.    Antimicrobials:  Anti-infectives (From admission, onward)    None       Subjective: Continued pain radiating down R leg  Objective: Vitals:   12/25/23 0449 12/25/23 0949 12/25/23 0950 12/25/23 0951  BP:  138/81 (!) 144/82 (!) 149/91 (!) 130/92  Pulse: 82 79 85 94  Resp: 17 15 17 17   Temp: 98.5 F (36.9 C) 98 F (36.7 C)    TempSrc:  Oral    SpO2: 97% 97% 99% 98%  Weight:      Height:        Intake/Output Summary (Last 24 hours) at 12/25/2023 1241 Last data filed at 12/25/2023 0955 Gross per 24 hour  Intake 900 ml  Output 1350 ml  Net -450 ml   Filed Weights   12/23/23 0824  Weight: 45.8 kg     Examination:  General: No acute distress. Cardiovascular: RRR Lungs: unlabored Abdomen: Soft, nontender, nondistended  Neurological: Alert and oriented 3. Moves all extremities 4 with equal strength. Cranial nerves II through XII grossly intact. Extremities: No clubbing or cyanosis. No edema.   Data Reviewed: I have personally reviewed following labs and imaging studies  CBC: Recent Labs  Lab 12/22/23 0727 12/23/23 0501 12/24/23 0800 12/25/23 0525  WBC 10.5 6.5 5.9 6.4  NEUTROABS 8.7*  --   --  3.9  HGB 13.0 12.2 12.7 12.4  HCT 40.2 38.0 40.8 39.4  MCV 93.1 93.6 95.8 95.9  PLT 335 281 282 333    Basic Metabolic Panel: Recent Labs  Lab 12/22/23 0727 12/23/23 0501 12/23/23 1117 12/23/23 1752 12/24/23 0800 12/25/23 0525  NA 137  --  138 139 136 138  K 3.7  --  3.6 4.3 3.5 3.8  CL 103  --   --  103 99 103  CO2 23  --   --  28 24 24   GLUCOSE 119*  --   --  94 80 86  BUN 14  --   --  17 20 26*  CREATININE 0.92 0.79  --  0.89 0.93 1.06*  CALCIUM  9.0  --   --  9.2 8.9 9.0  MG  --   --  2.3  --   --  2.0  PHOS  --   --  3.2  --   --  4.1    GFR: Estimated Creatinine Clearance: 38.3 mL/min (Regina Young) (by C-G formula based on SCr of 1.06 mg/dL (H)).  Liver Function Tests: Recent Labs  Lab 12/23/23 1752 12/25/23 0525  AST 17 15  ALT 13 12  ALKPHOS 77 65  BILITOT 0.7 0.7  PROT 7.7 6.5  ALBUMIN 4.7 3.9    CBG: Recent Labs  Lab 12/22/23 0730  GLUCAP 125*     No results found for this or any previous visit (from the past 240 hours).       Radiology Studies: VAS US  CAROTID Result Date: 12/25/2023 Carotid Arterial Duplex Study Patient Name:  Regina Young  Date of Exam:   12/24/2023 Medical Rec #: [id number]      Accession #:    [id number] Date of Birth: [birth year]      Patient Gender: F Patient Age:   37 years Exam Location:  [facility] Procedure:      VAS US  CAROTID Referring Phys: DAVID ORTIZ  --------------------------------------------------------------------------------  Indications:       Syncope. Risk Factors:      Hyperlipidemia, current smoker. Limitations        Today's exam was limited due to Regina Young VERY high bifurcation of                    the carotid bilaterally. Comparison Study:  No previous exams Performing Technologist: Jody Hill RVT, RDMS  Examination Guidelines: Honora Searson complete evaluation includes B-mode imaging, spectral Doppler, color Doppler, and power Doppler as needed of all accessible portions of each vessel. Bilateral testing is considered an integral part of Mael Delap complete examination. Limited examinations for reoccurring indications may be performed as noted.  Right Carotid Findings: +----------+--------+--------+--------+------------------+------------------+           PSV cm/sEDV cm/sStenosisPlaque DescriptionComments           +----------+--------+--------+--------+------------------+------------------+ CCA Prox  73      16                                intimal thickening +----------+--------+--------+--------+------------------+------------------+ CCA Distal91      27                                intimal thickening +----------+--------+--------+--------+------------------+------------------+ ICA Prox  59      23                                                   +----------+--------+--------+--------+------------------+------------------+ ICA Distal82      26                                tortuous           +----------+--------+--------+--------+------------------+------------------+ ECA       78      16                                                   +----------+--------+--------+--------+------------------+------------------+ +----------+--------+-------+----------------+-------------------+           PSV cm/sEDV cmsDescribe        Arm Pressure (mmHG) +----------+--------+-------+----------------+-------------------+ [id number]              Multiphasic, WNL                    +----------+--------+-------+----------------+-------------------+ +---------+--------+--+--------+--+---------+ VertebralPSV cm/s43EDV cm/s11Antegrade +---------+--------+--+--------+--+---------+  Left Carotid Findings: +----------+--------+--------+--------+------------------+--------+           PSV cm/sEDV cm/sStenosisPlaque DescriptionComments +----------+--------+--------+--------+------------------+--------+ CCA Prox  83      12                                         +----------+--------+--------+--------+------------------+--------+ CCA Distal63      23                                         +----------+--------+--------+--------+------------------+--------+ ICA Prox  73      29                                         +----------+--------+--------+--------+------------------+--------+ ICA Distal77      26                                         +----------+--------+--------+--------+------------------+--------+  ECA       84      21                                         +----------+--------+--------+--------+------------------+--------+ +----------+--------+--------+----------------+-------------------+           PSV cm/sEDV cm/sDescribe        Arm Pressure (mmHG) +----------+--------+--------+----------------+-------------------+ [id number]              Multiphasic, WNL                    +----------+--------+--------+----------------+-------------------+ +---------+--------+--+--------+--+---------+ VertebralPSV cm/s44EDV cm/s15Antegrade +---------+--------+--+--------+--+---------+   Summary: Right Carotid: The extracranial vessels were near-normal with only minimal wall                thickening or plaque. Left Carotid: The extracranial vessels were near-normal with only minimal wall               thickening or plaque. Vertebrals:  Bilateral vertebral arteries demonstrate antegrade  flow. Subclavians: Normal flow hemodynamics were seen in bilateral subclavian              arteries. *See table(s) above for measurements and observations.  Electronically signed by Ardella Beaver MD on 12/25/2023 at 11:55:45 AM.    Final    ECHOCARDIOGRAM COMPLETE Result Date: 12/24/2023    ECHOCARDIOGRAM REPORT   Patient Name:   AHNESTY FINFROCK Date of Exam: 12/24/2023 Medical Rec #:  [id number]     Height:       62.0 in Accession #:    [id number]    Weight:       101.0 lb Date of Birth:  1956-06-03     BSA:          1.430 m Patient Age:    67 years      BP:           145/87 mmHg Patient Gender: F             HR:           100 bpm. Exam Location:  Inpatient Procedure: 2D Echo, Cardiac Doppler and Color Doppler (Both Spectral and Color            Flow Doppler were utilized during procedure). Indications:    Syncope R55  History:        Patient has no prior history of Echocardiogram examinations.                 Risk Factors:Dyslipidemia.  Sonographer:    Hersey Lorenzo RDCS Referring Phys: [phone] DAVID MANUEL ORTIZ IMPRESSIONS  1. Left ventricular ejection fraction, by estimation, is 65 to 70%. The left ventricle has normal function. The left ventricle has no regional wall motion abnormalities.  2. Right ventricular systolic function is normal. The right ventricular size is normal.  3. Trivial mitral valve regurgitation.  4. The aortic valve is normal in structure. Aortic valve regurgitation is trivial.  5. The inferior vena cava is normal in size with greater than 50% respiratory variability, suggesting right atrial pressure of 3 mmHg. FINDINGS  Left Ventricle: Left ventricular ejection fraction, by estimation, is 65 to 70%. The left ventricle has normal function. The left ventricle has no regional wall motion abnormalities. The left ventricular internal cavity size was normal in size. There is  no left ventricular hypertrophy. Right Ventricle: The right ventricular size is normal. Right vetricular  wall thickness was  not assessed. Right ventricular systolic function is normal. Left Atrium: Left atrial size was normal in size. Right Atrium: Right atrial size was normal in size. Pericardium: There is no evidence of pericardial effusion. Mitral Valve: There is mild thickening of the mitral valve leaflet(s). Trivial mitral valve regurgitation. Tricuspid Valve: The tricuspid valve is normal in structure. Tricuspid valve regurgitation is mild. Aortic Valve: The aortic valve is normal in structure. Aortic valve regurgitation is trivial. Pulmonic Valve: The pulmonic valve was not well visualized. Pulmonic valve regurgitation is not visualized. No evidence of pulmonic stenosis. Aorta: The aortic root and ascending aorta are structurally normal, with no evidence of dilitation. Venous: The inferior vena cava is normal in size with greater than 50% respiratory variability, suggesting right atrial pressure of 3 mmHg. IAS/Shunts: No atrial level shunt detected by color flow Doppler.  LEFT VENTRICLE PLAX 2D LVIDd:         4.70 cm   Diastology LVIDs:         2.80 cm   LV e' lateral: 8.49 cm/s LV PW:         1.00 cm LV IVS:        1.00 cm LVOT diam:     2.20 cm LV SV:         62 LV SV Index:   43 LVOT Area:     3.80 cm  RIGHT VENTRICLE             IVC RV S prime:     11.40 cm/s  IVC diam: 1.00 cm TAPSE (M-mode): 1.4 cm LEFT ATRIUM           Index LA diam:      2.40 cm 1.68 cm/m LA Vol (A4C): 9.9 ml  6.91 ml/m  AORTIC VALVE LVOT Vmax:   102.00 cm/s LVOT Vmean:  69.600 cm/s LVOT VTI:    0.163 m  AORTA Ao Root diam: 2.80 cm Ao Asc diam:  3.00 cm  SHUNTS Systemic VTI:  0.16 m Systemic Diam: 2.20 cm Ola Berger MD Electronically signed by Ola Berger MD Signature Date/Time: 12/24/2023/3:19:36 PM    Final    MR Lumbar Spine W Wo Contrast Result Date: 12/24/2023 CLINICAL DATA:  68 year old female with low back pain, rectal cancer, invasive presacral soft tissue mass at the sacral promontory on CT. EXAM: MRI LUMBAR SPINE WITHOUT AND WITH CONTRAST  TECHNIQUE: Multiplanar and multiecho pulse sequences of the lumbar spine were obtained without and with intravenous contrast. CONTRAST:  5mL GADAVIST  GADOBUTROL  1 MMOL/ML IV SOLN COMPARISON:  CT Abdomen and Pelvis, lumbar spine CT 12/22/2023. FINDINGS: Segmentation: Normal on the comparison CT. There is Khamani Daniely vestigial S1-S2 disc space. Alignment: Maintained lumbar lordosis. Maintained alignment of the visible sacral segments. Vertebrae: Normal background bone marrow signal, and no acute or suspicious bone lesion identified in the lumbar or visible lower thoracic levels. However, extensive tumor infiltration of the sacrum at the S1 level, throughout the central sacrum there and in the sacral ala right greater than left (series 9, images 35 through 40). This appears associated with indistinct presacral soft tissue. The affected bone is heterogeneously enhancing and edematous. And there is epidural tumor throughout the right S1 neural foramen, inseparable from the nerve there. Left S1 neural foramen relatively spared. No sacral spinal canal involvement. 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 Conus medullaris and cauda equina: Conus extends to the L1-L2 level. No lower spinal cord or conus signal abnormality. Normal cauda equina nerve roots.  No abnormal enhancement above the right S1 nerve. No lumbar dural thickening or enhancement. Paraspinal and other soft tissues: Severe right hydronephrosis and hydroureter again noted. Disc levels: No age advanced lower thoracic or lumbar spine degeneration. Capacious spinal canal throughout those levels. Malignant impingement of the right S1 nerve as above. IMPRESSION: 1. 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 2. No other tumor or metastatic disease identified in the Lumbar Spine. 3. Ongoing Right Hydronephrosis and hydroureter. Electronically Signed   By: Marlise Simpers M.D.   On: 12/24/2023 07:50   CT CHEST W CONTRAST Result Date: 12/24/2023 CLINICAL DATA:  Rectal cancer. Evaluate for metastases. Restaging. * Tracking Code: BO * EXAM: CT CHEST WITH CONTRAST TECHNIQUE: Multidetector CT imaging of the chest was performed during intravenous contrast administration. RADIATION DOSE REDUCTION: This exam was performed according to the departmental dose-optimization program which includes automated exposure control, adjustment of the mA and/or kV according to patient size and/or use of iterative reconstruction technique. CONTRAST:  75mL OMNIPAQUE  IOHEXOL  300 MG/ML  SOLN COMPARISON:  05/21/2022 FINDINGS: Cardiovascular: The heart size is normal. No substantial pericardial effusion. Ascending thoracic aorta measures 3.2 cm diameter. Mediastinum/Nodes: No mediastinal lymphadenopathy. 1.8 x 1.6 cm soft tissue nodule is identified in the right suprahilar lung (axial soft tissue window 49 of series 2 and axial lung window 48 of series 8) compatible with abnormal hilar lymph node or central right upper lobe pulmonary nodule. No left hilar lymphadenopathy. The esophagus has normal imaging features. There is no axillary lymphadenopathy. Lungs/Pleura: Centrilobular and paraseptal emphysema evident. Biapical pleuroparenchymal scarring evident. As noted above, there is Kieley Akter 1.8 cm nodular lesion in the right suprahilar region (49/8). No other suspicious pulmonary nodule or mass. 3 mm peripheral left upper lobe nodule on 58/8 is stable since the previous study. Subtle 6 mm ground-glass opacity in the left upper lobe on 61/8 is unchanged. No pleural effusion. Upper Abdomen: Scattered tiny hypodensities in the liver parenchyma are too small to characterize but are statistically most likely benign. No followup imaging is recommended. Right-sided hydronephrosis seen on today's study was better characterized on yesterday's dedicated abdomen and pelvis CT. High density material in the dilated right  intrarenal collecting system and renal pelvis is probably residual contrast from the previous CT. Musculoskeletal: No worrisome lytic or sclerotic osseous abnormality. IMPRESSION: 1. 1.8 x 1.6 cm soft tissue nodule in the right suprahilar region compatible with abnormal hilar lymph node or central right upper lobe pulmonary nodule. Imaging features are suspicious for metastatic disease although synchronous primary bronchogenic neoplasm is also Yaden Seith consideration. PET-CT may prove helpful to further evaluate as clinically warranted. 2. No other evidence for metastatic disease in the chest. 3. Right-sided hydronephrosis seen on today's study was better characterized on yesterday's dedicated abdomen and pelvis CT. 4.  Emphysema. ([id number]) Electronically Signed   By: Donnal Fusi M.D.   On: 12/24/2023 06:43        Scheduled Meds:  calcium  citrate  1 tablet Oral Q1200   cyanocobalamin   1,000 mcg Oral Daily   gabapentin   300 mg Oral TID   pantoprazole   40 mg Oral Daily   rosuvastatin   5 mg Oral Daily   senna-docusate  1 tablet Oral BID   Continuous Infusions:   LOS: 0 days    Time spent: over 30 min    Donnetta Gains, MD [facility]   To contact the  attending provider between 7A-7P or the covering provider during after hours 7P-7A, please log into the web site [email or web address] and access using universal [place] password for that web site. If you do not have the password, please call the hospital operator.  12/25/2023, 12:41 PM

## 2023-12-26 DIAGNOSIS — R1900 Intra-abdominal and pelvic swelling, mass and lump, unspecified site: Secondary | ICD-10-CM | POA: Diagnosis not present

## 2023-12-26 LAB — COMPREHENSIVE METABOLIC PANEL WITH GFR
ALT: 12 U/L (ref 0–44)
AST: 15 U/L (ref 15–41)
Albumin: 3.8 g/dL (ref 3.5–5.0)
Alkaline Phosphatase: 63 U/L (ref 38–126)
Anion gap: 10 (ref 5–15)
BUN: 20 mg/dL (ref 8–23)
CO2: 25 mmol/L (ref 22–32)
Calcium: 9.1 mg/dL (ref 8.9–10.3)
Chloride: 104 mmol/L (ref 98–111)
Creatinine, Ser: 0.86 mg/dL (ref 0.44–1.00)
GFR, Estimated: >60 mL/min
Glucose, Bld: 105 mg/dL — ABNORMAL HIGH (ref 70–99)
Potassium: 3.9 mmol/L (ref 3.5–5.1)
Sodium: 139 mmol/L (ref 135–145)
Total Bilirubin: 0.7 mg/dL (ref 0.0–1.2)
Total Protein: 6.5 g/dL (ref 6.5–8.1)

## 2023-12-26 LAB — CBC WITH DIFFERENTIAL/PLATELET
Abs Immature Granulocytes: 0.03 K/uL (ref 0.00–0.07)
Basophils Absolute: 0 K/uL (ref 0.0–0.1)
Basophils Relative: 0 %
Eosinophils Absolute: 0 K/uL (ref 0.0–0.5)
Eosinophils Relative: 0 %
HCT: 37.4 % (ref 36.0–46.0)
Hemoglobin: 12.1 g/dL (ref 12.0–15.0)
Immature Granulocytes: 0 %
Lymphocytes Relative: 20 %
Lymphs Abs: 1.4 K/uL (ref 0.7–4.0)
MCH: 29.8 pg (ref 26.0–34.0)
MCHC: 32.4 g/dL (ref 30.0–36.0)
MCV: 92.1 fL (ref 80.0–100.0)
Monocytes Absolute: 0.4 K/uL (ref 0.1–1.0)
Monocytes Relative: 6 %
Neutro Abs: 4.9 K/uL (ref 1.7–7.7)
Neutrophils Relative %: 74 %
Platelets: 252 K/uL (ref 150–400)
RBC: 4.06 MIL/uL (ref 3.87–5.11)
RDW: 12.8 % (ref 11.5–15.5)
WBC: 6.8 K/uL (ref 4.0–10.5)
nRBC: 0 % (ref 0.0–0.2)

## 2023-12-26 LAB — PHOSPHORUS: Phosphorus: 3.8 mg/dL (ref 2.5–4.6)

## 2023-12-26 LAB — MAGNESIUM: Magnesium: 2.1 mg/dL (ref 1.7–2.4)

## 2023-12-26 MED ORDER — GABAPENTIN 300 MG PO CAPS
300.0000 mg | ORAL_CAPSULE | Freq: Three times a day (TID) | ORAL | 1 refills | Status: AC
Start: 2023-12-26 — End: 2024-02-24

## 2023-12-26 MED ORDER — DEXAMETHASONE 1 MG PO TABS: ORAL_TABLET | ORAL | 0 refills | Status: DC

## 2023-12-26 NOTE — Discharge Summary (Addendum)
 Physician Discharge Summary  Regina Young ZOX:[id number] DOB: [birth year] DOA: 12/22/2023  PCP: Alvie Ax, MD (Inactive)  Admit date: 12/22/2023 Discharge date: 12/26/2023  Time spent: 40 minutes  Recommendations for Outpatient Follow-up:  Follow outpatient CBC/CMP  Follow with oncology outpatient Follow with pulm outpatient Follow with urology outpatient Follow with neurosurgery outpatient Follow steroid taper and sciatica symptoms outpatient Titrate gabapentin  as tolerated    Discharge Diagnoses:  Principal Problem:   Retroperitoneal mass Active Problems:   Cancer of sigmoid colon, Stage 1, pT1pN0, s/p robotic LAR resection 02/21/2021   Hyperlipidemia   Hydronephrosis, right   Syncope and collapse   History of abdominal hysterectomy   Low back pain   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 female with medical history significant of colon cancer, hyperlipidemia,, liver cyst, splenic cyst, history of abdominal hysterectomy who presented to the emergency department after having Regina Young syncopal episode at home.    She was found to have Regina Young retroperitoneal soft tissue mass.  MRI showed tumor infiltration of sacrum and involvement of R S1 nerve.  IR did not see site amendable to percutaneous biopsy.  NSGY and surgery did not see role for them to biopsy.  Plan for pulm outpatient for navigational bronch to eval R suprahilar nodule.  Stable for discharge 4/20.  Discussed with neurosurgery again on day of discharge and they're willing to follow outpatient to discuss possible procedure for open biopsy.  Hospital Course:  Assessment and Plan:  Retroperitoneal Soft Tissue mass concerning for metastatic cancer CT with recurrent retroperitoneal soft tissue mass within retroperitoneum abutting the R anterolateral margin of the S1  vertebral body resulting in malignant destruction of the anterosuperior aspect of the S1 vertebral body and perineural extension into the R S1 neural foramen.  Encasement of proximal R internal iliac artery.  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  MRI with tumor infiltration of sacrum at S1 level, asymmetric to R and severely involving the R S1 nerve R s1 neural foramen  Per IR, Dr. Darylene Epley, nothing amendable to percutaneous biopsy Discussed with Neurosurgery who did not note any biopsy site - they're going to reevaluate outpatient.  Surgery also did not note Regina Young role for them to biopsy Pulm planning outpatient eval of right suprahilar nodule via navigational bronch Appreciate oncology involvement - follow outpatient  Trial gabapentin  for nerve pain - uptitrate as tolerated Trial dexamethasone  - will discharge with taper She's doing relatively well on gabapentin  and dexamethasone    Syncope and collapse Echo with normal EF Carotids near normal  Follow orthostatics - negative Telemetry      Hyperlipidemia Continue rosuvastatin  5 mg p.o. daily.     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 No need for urgent procedure at this time. Will continue to monitor electrolytes/GFR.    Procedures: IMPRESSIONS     1. Left ventricular ejection fraction, by estimation, is 65 to 70%. The  left ventricle has normal function. The left ventricle has no regional  wall motion abnormalities.   2. Right ventricular systolic function is normal. The right ventricular  size is normal.   3. Trivial mitral valve regurgitation.   4. The aortic valve is normal in structure. Aortic valve regurgitation is  trivial.   5. The inferior vena  cava is normal in size with greater than 50%  respiratory variability, suggesting right atrial pressure of 3 mmHg.   Summary:  Right Carotid: The extracranial vessels were near-normal  with only minimal  wall                thickening or plaque.   Left Carotid: The extracranial vessels were near-normal with only minimal  wall               thickening or plaque.   Vertebrals:  Bilateral vertebral arteries demonstrate antegrade flow.  Subclavians: Normal flow hemodynamics were seen in bilateral subclavian               arteries.    Consultations: Oncology IR Pulmonology (plan for outpatient f/u) Surgery Nsgy (phone - Regina Young)  Discharge Exam: Vitals:   12/26/23 0555 12/26/23 1355  BP:  (!) 147/92  Pulse:  94  Resp:  16  Temp:  97.9 F (36.6 C)  SpO2: 95% 97%   Pain better today Many questions from husband and sister They're appropriately anxious   General: No acute distress. Pacing in the room as we talk.  Lungs: unlabored Neurological: Alert and oriented 3. Moves all extremities 4 with equal strength. Cranial nerves II through XII grossly intact. Extremities: No clubbing or cyanosis. No edema.   Discharge Instructions   Discharge Instructions     Call MD for:  difficulty breathing, headache or visual disturbances   Complete by: As directed    Call MD for:  extreme fatigue   Complete by: As directed    Call MD for:  hives   Complete by: As directed    Call MD for:  persistant dizziness or light-headedness   Complete by: As directed    Call MD for:  persistant nausea and vomiting   Complete by: As directed    Call MD for:  redness, tenderness, or signs of infection (pain, swelling, redness, odor or green/yellow discharge around incision site)   Complete by: As directed    Call MD for:  severe uncontrolled pain   Complete by: As directed    Call MD for:  temperature >100.4   Complete by: As directed    Diet - low sodium heart healthy   Complete by: As directed    Discharge instructions   Complete by: As directed    You were seen for Regina Young tumor infiltrating the sacrum.  This involves the right S1 nerve.  This is what's causing your  pain radiating down your right leg.  We've started you on gabapentin  for your nerve pain.  I'll start you on Regina Young steroid taper over the next 14 days.  Follow up with oncology for ongoing pain management.    You have hydronephrosis (ballooning or dilation of the kidney) and hydroureter (dilation of the tube leaving the kidney) due to obstruction from the mass.  You'll need to follow up with urology as an outpatient, you may eventually need Regina Young procedure, but your kidney function is stable for now.  Unfortunately, interventional radiology, surgery, and neurosurgery did not think they could biopsy the sacral lesion.  I spoke to neurosurgery again today and they'd like to follow up with you to potentially discuss Regina Young procedure as an outpatient.  Call their office tomorrow (Monday, 4/21) and Dr. Ellery Guthrie will try to work you in.    We'll refer you to pulmonology for Regina Young bronchoscopy to see if they can sample the right suprahilar nodule.  They should call  you to schedule this in the next few days.  Return for new, recurrent, or worsening symptoms.  Please ask your PCP to request records from this hospitalization so they know what was done and what the next steps will be.   Increase activity slowly   Complete by: As directed       Allergies as of 12/26/2023   No Known Allergies      Medication List     TAKE these medications    acetaminophen  500 MG tablet Commonly known as: TYLENOL  Take 500-1,000 mg by mouth every 6 (six) hours as needed (PAIN).   alendronate 70 MG tablet Commonly known as: FOSAMAX Take 70 mg by mouth once Avila Albritton week. 1 tablet 30 minutes before the first food, beverage or medicine of the day with plain water Orally once weekly for 84 days   Calcium  Citrate 250 MG Tabs Take 1 tablet by mouth daily at 12 noon.   Cholecalciferol 50 MCG (2000 UT) Caps Take 1 capsule by mouth daily at 12 noon.   cyanocobalamin  1000 MCG tablet Commonly known as: VITAMIN B12 Take 1,000 mcg by mouth  daily.   dexamethasone  1 MG tablet Commonly known as: DECADRON  Take 4 tablets (4 mg total) by mouth daily for 5 days, THEN 2 tablets (2 mg total) daily for 5 days, THEN 1 tablet (1 mg total) daily for 5 days. Follow up with oncology outpatient regarding ongoing steroids. Start taking on: December 27, 2023   gabapentin  300 MG capsule Commonly known as: NEURONTIN  Take 1 capsule (300 mg total) by mouth 3 (three) times daily.   methocarbamol  500 MG tablet Commonly known as: ROBAXIN  Take 1 tablet (500 mg total) by mouth every 8 (eight) hours as needed for muscle spasms.   omeprazole 20 MG Tbdd disintegrating tablet Take 20 mg by mouth daily.   rosuvastatin  5 MG tablet Commonly known as: CRESTOR  Take 5 mg by mouth in the morning. (0800)       No Known Allergies  Follow-up Information     Christina Coyer, MD. Call.   Specialty: Urology Contact information: [address] [place] [address] [phone]         Denson Flake, MD Follow up.   Specialty: Pulmonary Disease Why: the pulmonary clinic will call your regarding scheduling Merl Bommarito procedure.  if you don't hear from them in [employer] few days please call. Contact information: [address] [place] [address] [phone]         Ivor Mars, MD Follow up.   Specialty: Oncology Why: oncology should call to schedule an appointment, if you don't hear in [place]pp[place] few days, call for Aalivia Mcgraw follow up appointment Contact information: [address] [place] [address] [phone]         Elna Haggis, MD Follow up.   Specialty: Neurosurgery Why: call [phone] extension [phone] and asked to speak to Adah Acron (they'll try to work you in this week). Contact information: [address] [place] [address] [phone]                  The results of significant diagnostics from this hospitalization (including imaging, microbiology, ancillary and laboratory) are  listed below for reference.    Significant Diagnostic Studies: VAS US  CAROTID Result Date: 12/25/2023 Carotid Arterial Duplex Study Patient Name:  Regina Young  Date of Exam:   12/24/2023 Medical Rec #: [id number]      Accession #:  [id number] Date of Birth: 25-Mar-1956      Patient Gender: F Patient Age:   72 years Exam Location:  [facility] Procedure:      VAS US  CAROTID Referring Phys: DAVID ORTIZ --------------------------------------------------------------------------------  Indications:       Syncope. Risk Factors:      Hyperlipidemia, current smoker. Limitations        Today's exam was limited due to Joslyn Ramos VERY high bifurcation of                    the carotid bilaterally. Comparison Study:  No previous exams Performing Technologist: Jody Hill RVT, RDMS  Examination Guidelines: Haizley Cannella complete evaluation includes B-mode imaging, spectral Doppler, color Doppler, and power Doppler as needed of all accessible portions of each vessel. Bilateral testing is considered an integral part of Tata Timmins complete examination. Limited examinations for reoccurring indications may be performed as noted.  Right Carotid Findings: +----------+--------+--------+--------+------------------+------------------+           PSV cm/sEDV cm/sStenosisPlaque DescriptionComments           +----------+--------+--------+--------+------------------+------------------+ CCA Prox  73      16                                intimal thickening +----------+--------+--------+--------+------------------+------------------+ CCA Distal91      27                                intimal thickening +----------+--------+--------+--------+------------------+------------------+ ICA Prox  59      23                                                   +----------+--------+--------+--------+------------------+------------------+ ICA Distal82      26                                tortuous            +----------+--------+--------+--------+------------------+------------------+ ECA       78      16                                                   +----------+--------+--------+--------+------------------+------------------+ +----------+--------+-------+----------------+-------------------+           PSV cm/sEDV cmsDescribe        Arm Pressure (mmHG) +----------+--------+-------+----------------+-------------------+ [id number]             Multiphasic, WNL                    +----------+--------+-------+----------------+-------------------+ +---------+--------+--+--------+--+---------+ VertebralPSV cm/s43EDV cm/s11Antegrade +---------+--------+--+--------+--+---------+  Left Carotid Findings: +----------+--------+--------+--------+------------------+--------+           PSV cm/sEDV cm/sStenosisPlaque DescriptionComments +----------+--------+--------+--------+------------------+--------+ CCA Prox  83      12                                         +----------+--------+--------+--------+------------------+--------+ CCA Distal63      23                                         +----------+--------+--------+--------+------------------+--------+  ICA Prox  73      29                                         +----------+--------+--------+--------+------------------+--------+ ICA Distal77      26                                         +----------+--------+--------+--------+------------------+--------+ ECA       84      21                                         +----------+--------+--------+--------+------------------+--------+ +----------+--------+--------+----------------+-------------------+           PSV cm/sEDV cm/sDescribe        Arm Pressure (mmHG) +----------+--------+--------+----------------+-------------------+ [id number]              Multiphasic, WNL                     +----------+--------+--------+----------------+-------------------+ +---------+--------+--+--------+--+---------+ VertebralPSV cm/s44EDV cm/s15Antegrade +---------+--------+--+--------+--+---------+   Summary: Right Carotid: The extracranial vessels were near-normal with only minimal wall                thickening or plaque. Left Carotid: The extracranial vessels were near-normal with only minimal wall               thickening or plaque. Vertebrals:  Bilateral vertebral arteries demonstrate antegrade flow. Subclavians: Normal flow hemodynamics were seen in bilateral subclavian              arteries. *See table(s) above for measurements and observations.  Electronically signed by Ardella Beaver MD on 12/25/2023 at 11:55:45 AM.    Final    ECHOCARDIOGRAM COMPLETE Result Date: 12/24/2023    ECHOCARDIOGRAM REPORT   Patient Name:   Regina Young Date of Exam: 12/24/2023 Medical Rec #:  [id number]     Height:       62.0 in Accession #:    [id number]    Weight:       101.0 lb Date of Birth:  Feb 02, 1956     BSA:          1.430 m Patient Age:    67 years      BP:           145/87 mmHg Patient Gender: F             HR:           100 bpm. Exam Location:  Inpatient Procedure: 2D Echo, Cardiac Doppler and Color Doppler (Both Spectral and Color            Flow Doppler were utilized during procedure). Indications:    Syncope R55  History:        Patient has no prior history of Echocardiogram examinations.                 Risk Factors:Dyslipidemia.  Sonographer:    Hersey Lorenzo RDCS Referring Phys: [phone] DAVID MANUEL ORTIZ IMPRESSIONS  1. Left ventricular ejection fraction, by estimation, is 65 to 70%. The left ventricle has normal function. The left ventricle has no regional wall motion abnormalities.  2. Right ventricular systolic function is normal. The right ventricular size is  normal.  3. Trivial mitral valve regurgitation.  4. The aortic valve is normal in structure. Aortic valve regurgitation is trivial.  5. The  inferior vena cava is normal in size with greater than 50% respiratory variability, suggesting right atrial pressure of 3 mmHg. FINDINGS  Left Ventricle: Left ventricular ejection fraction, by estimation, is 65 to 70%. The left ventricle has normal function. The left ventricle has no regional wall motion abnormalities. The left ventricular internal cavity size was normal in size. There is  no left ventricular hypertrophy. Right Ventricle: The right ventricular size is normal. Right vetricular wall thickness was not assessed. Right ventricular systolic function is normal. Left Atrium: Left atrial size was normal in size. Right Atrium: Right atrial size was normal in size. Pericardium: There is no evidence of pericardial effusion. Mitral Valve: There is mild thickening of the mitral valve leaflet(s). Trivial mitral valve regurgitation. Tricuspid Valve: The tricuspid valve is normal in structure. Tricuspid valve regurgitation is mild. Aortic Valve: The aortic valve is normal in structure. Aortic valve regurgitation is trivial. Pulmonic Valve: The pulmonic valve was not well visualized. Pulmonic valve regurgitation is not visualized. No evidence of pulmonic stenosis. Aorta: The aortic root and ascending aorta are structurally normal, with no evidence of dilitation. Venous: The inferior vena cava is normal in size with greater than 50% respiratory variability, suggesting right atrial pressure of 3 mmHg. IAS/Shunts: No atrial level shunt detected by color flow Doppler.  LEFT VENTRICLE PLAX 2D LVIDd:         4.70 cm   Diastology LVIDs:         2.80 cm   LV e' lateral: 8.49 cm/s LV PW:         1.00 cm LV IVS:        1.00 cm LVOT diam:     2.20 cm LV SV:         62 LV SV Index:   43 LVOT Area:     3.80 cm  RIGHT VENTRICLE             IVC RV S prime:     11.40 cm/s  IVC diam: 1.00 cm TAPSE (M-mode): 1.4 cm LEFT ATRIUM           Index LA diam:      2.40 cm 1.68 cm/m LA Vol (A4C): 9.9 ml  6.91 ml/m  AORTIC VALVE LVOT Vmax:    102.00 cm/s LVOT Vmean:  69.600 cm/s LVOT VTI:    0.163 m  AORTA Ao Root diam: 2.80 cm Ao Asc diam:  3.00 cm  SHUNTS Systemic VTI:  0.16 m Systemic Diam: 2.20 cm Ola Berger MD Electronically signed by Ola Berger MD Signature Date/Time: 12/24/2023/3:19:36 PM    Final    MR Lumbar Spine W Wo Contrast Result Date: 12/24/2023 CLINICAL DATA:  68 year old female with low back pain, rectal cancer, invasive presacral soft tissue mass at the sacral promontory on CT. EXAM: MRI LUMBAR SPINE WITHOUT AND WITH CONTRAST TECHNIQUE: Multiplanar and multiecho pulse sequences of the lumbar spine were obtained without and with intravenous contrast. CONTRAST:  5mL GADAVIST  GADOBUTROL  1 MMOL/ML IV SOLN COMPARISON:  CT Abdomen and Pelvis, lumbar spine CT 12/22/2023. FINDINGS: Segmentation: Normal on the comparison CT. There is Sevrin Sally vestigial S1-S2 disc space. Alignment: Maintained lumbar lordosis. Maintained alignment of the visible sacral segments. Vertebrae: Normal background bone marrow signal, and no acute or suspicious bone lesion identified in the lumbar or visible lower thoracic levels. However, extensive tumor infiltration of  the sacrum at the S1 level, throughout the central sacrum there and in the sacral ala right greater than left (series 9, images 35 through 40). This appears associated with indistinct presacral soft tissue. The affected bone is heterogeneously enhancing and edematous. And there is epidural tumor throughout the right S1 neural foramen, inseparable from the nerve there. Left S1 neural foramen relatively spared. No sacral spinal canal involvement. 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 Conus medullaris and cauda equina: Conus extends to the L1-L2 level. No lower spinal cord or conus signal abnormality. Normal cauda equina nerve roots. No abnormal enhancement above the right S1 nerve. No lumbar dural thickening or enhancement. Paraspinal and other soft tissues: Severe right hydronephrosis and  hydroureter again noted. Disc levels: No age advanced lower thoracic or lumbar spine degeneration. Capacious spinal canal throughout those levels. Malignant impingement of the right S1 nerve as above. IMPRESSION: 1. 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 2. No other tumor or metastatic disease identified in the Lumbar Spine. 3. Ongoing Right Hydronephrosis and hydroureter. Electronically Signed   By: Marlise Simpers M.D.   On: 12/24/2023 07:50   CT CHEST W CONTRAST Result Date: 12/24/2023 CLINICAL DATA:  Rectal cancer. Evaluate for metastases. Restaging. * Tracking Code: BO * EXAM: CT CHEST WITH CONTRAST TECHNIQUE: Multidetector CT imaging of the chest was performed during intravenous contrast administration. RADIATION DOSE REDUCTION: This exam was performed according to the departmental dose-optimization program which includes automated exposure control, adjustment of the mA and/or kV according to patient size and/or use of iterative reconstruction technique. CONTRAST:  75mL OMNIPAQUE  IOHEXOL  300 MG/ML  SOLN COMPARISON:  05/21/2022 FINDINGS: Cardiovascular: The heart size is normal. No substantial pericardial effusion. Ascending thoracic aorta measures 3.2 cm diameter. Mediastinum/Nodes: No mediastinal lymphadenopathy. 1.8 x 1.6 cm soft tissue nodule is identified in the right suprahilar lung (axial soft tissue window 49 of series 2 and axial lung window 48 of series 8) compatible with abnormal hilar lymph node or central right upper lobe pulmonary nodule. No left hilar lymphadenopathy. The esophagus has normal imaging features. There is no axillary lymphadenopathy. Lungs/Pleura: Centrilobular and paraseptal emphysema evident. Biapical pleuroparenchymal scarring evident. As noted above, there is Arcadio Cope 1.8 cm nodular lesion in the right suprahilar region (49/8). No other suspicious pulmonary nodule or mass. 3 mm  peripheral left upper lobe nodule on 58/8 is stable since the previous study. Subtle 6 mm ground-glass opacity in the left upper lobe on 61/8 is unchanged. No pleural effusion. Upper Abdomen: Scattered tiny hypodensities in the liver parenchyma are too small to characterize but are statistically most likely benign. No followup imaging is recommended. Right-sided hydronephrosis seen on today's study was better characterized on yesterday's dedicated abdomen and pelvis CT. High density material in the dilated right intrarenal collecting system and renal pelvis is probably residual contrast from the previous CT. Musculoskeletal: No worrisome lytic or sclerotic osseous abnormality. IMPRESSION: 1. 1.8 x 1.6 cm soft tissue nodule in the right suprahilar region compatible with abnormal hilar lymph node or central right upper lobe pulmonary nodule. Imaging features are suspicious for metastatic disease although synchronous primary bronchogenic neoplasm is also Geri Hepler consideration. PET-CT may prove helpful to further evaluate as clinically warranted. 2. No other evidence for metastatic disease in the chest. 3. Right-sided hydronephrosis seen on today's study was better characterized on yesterday's dedicated abdomen and pelvis CT. 4.  Emphysema. ([id number]) Electronically  Signed   By: Donnal Fusi M.D.   On: 12/24/2023 06:43   CT L-SPINE NO CHARGE Result Date: 12/22/2023 CLINICAL DATA:  Recurrent right leg pain, right thigh pain, right calf pain. History of colon cancer. * Tracking Code: BO * EXAM: CT LUMBAR SPINE WITHOUT CONTRAST TECHNIQUE: Multidetector CT imaging of the lumbar spine was performed without intravenous contrast administration. Multiplanar CT image reconstructions were also generated. RADIATION DOSE REDUCTION: This exam was performed according to the departmental dose-optimization program which includes automated exposure control, adjustment of the mA and/or kV according to patient size and/or use of  iterative reconstruction technique. COMPARISON:  Findings are correlated with concurrently performed CT examination the abdomen and pelvis. FINDINGS: Segmentation: 5 lumbar type vertebrae. Alignment: Normal. Vertebrae: There is malignant erosion of the right anterosuperior aspect of the S1 vertebral body, best appreciated on image # 40/17 secondary superior endplate fracture of S1 with mild loss of height anteriorly. No other fracture identified. Remaining vertebral body height is preserved. Paraspinal and other soft tissues: 1.6 x 3.3 cm infiltrative soft tissue mass is seen along the right anterolateral aspect of S1 demonstrating perineural extension into the right S1 neural foramen without involvement of the thecal sac itself. The mass demonstrates encasement of the proximal right common iliac artery, best seen image # 107/20. The mass is indistinguishable from the adjacent L5-S1 intervertebral disc space though disc space height is preserved. 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 Multiple surgical clips are seen in the region of the mass, several of which are encased by the mass. See complete report for CT examination the abdomen and pelvis. Disc levels: Intervertebral disc heights are preserved. Spinal canal is widely patent. No significant neuroforaminal narrowing. IMPRESSION: 1. 1.6 x 3.3 cm infiltrative soft tissue mass along the right anterolateral aspect of S1 2. Perineural extension into the right S1 neural foramen without involvement of the thecal sac itself. 3. 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 4. Associated malignant erosion of the right anterosuperior aspect of the S1 vertebral body with mild loss of height anteriorly. 5. The mass is indistinguishable from the adjacent L5-S1 intervertebral disc space though disc space height is preserved. 6. Encasement of the proximal right common iliac artery. 7. These findings would be better assessed with  contrast enhanced MRI examination Electronically Signed   By: Worthy Heads M.D.   On: 12/22/2023 22:18   CT ABDOMEN PELVIS W CONTRAST Result Date: 12/22/2023 CLINICAL DATA:  Abdominal and flank pain, recurrent right leg pain. Colon cancer. * Tracking Code: BO * EXAM: CT ABDOMEN AND PELVIS WITH CONTRAST TECHNIQUE: Multidetector CT imaging of the abdomen and pelvis was performed using the standard protocol following bolus administration of intravenous contrast. RADIATION DOSE REDUCTION: This exam was performed according to the departmental dose-optimization program which includes automated exposure control, adjustment of the mA and/or kV according to patient size and/or use of iterative reconstruction technique. CONTRAST:  96mL OMNIPAQUE  IOHEXOL  300 MG/ML  SOLN COMPARISON:  02/03/2023, MRI 06/13/2023 FINDINGS: Lower chest: No acute abnormality. Hepatobiliary: Mild hepatic steatosis. Scattered simple hepatic cysts. No enhancing intrahepatic mass. No intra or extrahepatic biliary ductal dilation. Gallbladder unremarkable. Pancreas: Unremarkable Spleen: Numerous previously identified simple cyst within the spleen have decreased in size. No enhancing intra splenic mass. The spleen is normal in size. No perisplenic fluid collection. Adrenals/Urinary Tract: The adrenal glands are unremarkable. Interval development of severe right hydronephrosis and proximal hydroureter with malignant occlusion of the distal  right ureter by the recurrent retroperitoneal mass described below. This is best seen on axial image # 40/2 and sagittal image # 71/10. Interval development of mild associated right renal cortical atrophy suggesting this represents Hanley Rispoli mole standing obstruction. Delayed right renal cortical enhancement in keeping with high-grade obstruction. The left kidney is unremarkable. Bladder unremarkable. Stomach/Bowel: Surgical changes of sigmoid colectomy are identified. Stomach, small bowel, and large bowel are otherwise  unremarkable there is no evidence of obstruction or focal inflammation. Appendix absent. Numerous surgical clips are seen within the retroperitoneum. Adjacent to these clips is Maejor Erven new infiltrative soft tissue mass measuring at least 4.5 x 1.7 cm at axial image # 38/2 abutting the right anterolateral margin of the S1 vertebral body with malignant destruction of the anterosuperior aspect of the vertebral body best seen on image # 37/2 and perineural extension into the right S1 neural foramen, best seen on image # 36-37/2. The mass is poorly delineated from the adjacent L5-S1 disc space though I suspect this structure is abutted but not directly invaded. Vascular/Lymphatic: Mild aortoiliac atherosclerotic calcification. The previously described recurrence retroperitoneal soft tissue mass abuts and encases the right internal iliac artery proximally at axial image # 38/2 and likely abuts but does not encase the left common iliac vein. The abdominal vasculature is otherwise unremarkable. Reproductive: Status post hysterectomy. No adnexal masses. Other: No abdominal wall hernia Musculoskeletal: No acute bone abnormality. IMPRESSION: 1. Recurrent retroperitoneal soft tissue mass within the retroperitoneum abutting the right anterolateral margin of the S1 vertebral body resulting in malignant destruction of the anterosuperior aspect of the S1 vertebral body and perineural extension into the right S1 neural foramen. Encasement of the proximal right internal iliac artery. 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 2. Mild hepatic steatosis. 3. Surgical changes of sigmoid colectomy. 4.  Aortic Atherosclerosis (ICD10-I70.0). Electronically Signed   By: Worthy Heads M.D.   On: 12/22/2023 22:09   CT Head Wo Contrast Result Date: 12/22/2023 CLINICAL DATA:  Syncope/presyncope, cerebrovascular cause suspected EXAM: CT HEAD WITHOUT CONTRAST TECHNIQUE: Contiguous axial  images were obtained from the base of the skull through the vertex without intravenous contrast. RADIATION DOSE REDUCTION: This exam was performed according to the departmental dose-optimization program which includes automated exposure control, adjustment of the mA and/or kV according to patient size and/or use of iterative reconstruction technique. COMPARISON:  None Available. FINDINGS: Brain: No evidence of large-territorial acute infarction. No parenchymal hemorrhage. No mass lesion. No extra-axial collection. No mass effect or midline shift. No hydrocephalus. Basilar cisterns are patent. Vascular: No hyperdense vessel. Skull: No acute fracture or focal lesion. Sinuses/Orbits: Paranasal sinuses and mastoid air cells are clear. The orbits are unremarkable. Other: Left parieto-occipital scalp soft tissue density with calcification likely Ildefonso Keaney sebaceous cyst. IMPRESSION: No acute intracranial abnormality. Electronically Signed   By: Morgane  Naveau M.D.   On: 12/22/2023 19:44   XR Pelvis 1-2 Views Result Date: 12/15/2023 AP pelvis some degenerative changes of the hips by back laterally with some sclerotic changes in the superior acetabulum no acute fractures  XR Lumbar Spine 2-3 Views Result Date: 12/15/2023 Radiographs of the lumbar spine demonstrate overall well-maintained alignment some endplate sclerotic changes and some facet arthropathy no acute fractures degenerative changes noted mostly at L5-S1   Microbiology: No results found for this or any previous visit (from the past 240 hours).   Labs: Basic Metabolic Panel: Recent Labs  Lab 12/22/23 0727 12/23/23 0501 12/23/23 1117 12/23/23 1752 12/24/23  0800 12/25/23 0525 12/26/23 0446  NA 137  --  138 139 136 138 139  K 3.7  --  3.6 4.3 3.5 3.8 3.9  CL 103  --   --  103 99 103 104  CO2 23  --   --  28 24 24 25   GLUCOSE 119*  --   --  94 80 86 105*  BUN 14  --   --  17 20 26* 20  CREATININE 0.92 0.79  --  0.89 0.93 1.06* 0.86  CALCIUM  9.0   --   --  9.2 8.9 9.0 9.1  MG  --   --  2.3  --   --  2.0 2.1  PHOS  --   --  3.2  --   --  4.1 3.8   Liver Function Tests: Recent Labs  Lab 12/23/23 1752 12/25/23 0525 12/26/23 0446  AST 17 15 15   ALT 13 12 12   ALKPHOS 77 65 63  BILITOT 0.7 0.7 0.7  PROT 7.7 6.5 6.5  ALBUMIN 4.7 3.9 3.8   No results for input(s): "LIPASE", "AMYLASE" in the last 168 hours. No results for input(s): "AMMONIA" in the last 168 hours. CBC: Recent Labs  Lab 12/22/23 0727 12/23/23 0501 12/24/23 0800 12/25/23 0525 12/26/23 0446  WBC 10.5 6.5 5.9 6.4 6.8  NEUTROABS 8.7*  --   --  3.9 4.9  HGB 13.0 12.2 12.7 12.4 12.1  HCT 40.2 38.0 40.8 39.4 37.4  MCV 93.1 93.6 95.8 95.9 92.1  PLT 335 281 282 333 252   Cardiac Enzymes: No results for input(s): "CKTOTAL", "CKMB", "CKMBINDEX", "TROPONINI" in the last 168 hours. BNP: BNP (last 3 results) No results for input(s): "BNP" in the last 8760 hours.  ProBNP (last 3 results) No results for input(s): "PROBNP" in the last 8760 hours.  CBG: Recent Labs  Lab 12/22/23 0730  GLUCAP 125*       Signed:  Donnetta Gains MD.  [facility] 12/26/2023, 5:08 PM

## 2023-12-26 NOTE — Progress Notes (Signed)
 Nurse reviewed discharged instructions with pt. Pt verbalized understanding of discharge instructions, follow up appointments and new medications.  No concerns at time of discharge.  Pt ambulated off unit.

## 2023-12-26 NOTE — Progress Notes (Signed)
 Hematology/Oncology Progress Note  Clinical Summary: [name] is a 68 year old female with medical history significant for remote stage I colon cancer who presents for evaluation of a retroperitoneal soft tissue mass.  Interval History: -- MRI performed on 12/24/2023 showed extensive tumor infiltration of the sacrum at S1, severely involving the right S1 nerve and S1 neural foramen.  Also evaluated on scan from 12/22/2023 -- Reportedly the mass is challenging to biopsy, neurosurgery, general surgery, and IR noted they are not able to biopsy this area.  Currently working toward having the lung lesion biopsied -- On exam today Regina Young reports she feels ready for discharge.  She notes that she feels safe at home and feels like she will be able to function well and perform her day-to-day tasks without difficulty.  O:  Vitals:   12/26/23 0555 12/26/23 1355  BP:  (!) 147/92  Pulse:  94  Resp:  16  Temp:  97.9 F (36.6 C)  SpO2: 95% 97%      Latest Ref Rng & Units 12/26/2023    4:46 AM 12/25/2023    5:25 AM 12/24/2023    8:00 AM  CMP  Glucose 70 - 99 mg/dL 696  86  80   BUN 8 - 23 mg/dL 20  26  20    Creatinine 0.44 - 1.00 mg/dL 2.95  2.84  1.32   Sodium 135 - 145 mmol/L 139  138  136   Potassium 3.5 - 5.1 mmol/L 3.9  3.8  3.5   Chloride 98 - 111 mmol/L 104  103  99   CO2 22 - 32 mmol/L 25  24  24    Calcium  8.9 - 10.3 mg/dL 9.1  9.0  8.9   Total Protein 6.5 - 8.1 g/dL 6.5  6.5    Total Bilirubin 0.0 - 1.2 mg/dL 0.7  0.7    Alkaline Phos 38 - 126 U/L 63  65    AST 15 - 41 U/L 15  15    ALT 0 - 44 U/L 12  12        Latest Ref Rng & Units 12/26/2023    4:46 AM 12/25/2023    5:25 AM 12/24/2023    8:00 AM  CBC  WBC 4.0 - 10.5 K/uL 6.8  6.4  5.9   Hemoglobin 12.0 - 15.0 g/dL 44.0  10.2  72.5   Hematocrit 36.0 - 46.0 % 37.4  39.4  40.8   Platelets 150 - 400 K/uL 252  333  282       GENERAL: well appearing middle-age Caucasian female in NAD  SKIN: skin color, texture, turgor  are normal, no rashes or significant lesions EYES: conjunctiva are pink and non-injected, sclera clear LUNGS: clear to auscultation and percussion with normal breathing effort HEART: regular rate & rhythm and no murmurs and no lower extremity edema Musculoskeletal: no cyanosis of digits and no clubbing  PSYCH: alert & oriented x 3, fluent speech NEURO: no focal motor/sensory deficits  Assessment/Plan:  # Retroperitoneal Mass, Unclear Etiology  # Hx of Colon Cancer Stage I -- Reportedly neurosurgery, general surgery, and IR note the mass cannot be biopsied. -- Agree with pulmonary biopsy of lung lesion. -- Mass is highly suspicious for malignancy, potentially sarcoma.  May need to make referral to tertiary care center for surgical biopsy if diagnosis is not feasible. --May need to consider PET CT scan in outpatient setting to determine if both lung nodule and retroperitoneal mass are hypermetabolic. -- Very low  clinical suspicion this represents colon cancer as the patient has a remote history of early-stage status post resection. -- Discussed options moving forward with the patient. -- No barrier to discharge from an oncological standpoint.  Recommend outpatient follow-up with Dr. Telford Feather, MD Department of Hematology/Oncology [facility] Phone: [phone] Pager: [phone] Email: [email or web address]

## 2023-12-27 ENCOUNTER — Encounter: Payer: Self-pay | Admitting: *Deleted

## 2023-12-27 NOTE — Progress Notes (Signed)
 Patient was seen in the hospital by Dr Maria Shiner. While admitted she was found to have a retroperitoneal mass as well as a pulmonary nodule. Patient has a history of stage I colon cancer. She was discharged over the weekend. She needs a biopsy, likely a bronch, however her follow up isn't scheduled until 02/24/2024.  Spoke to the office manager this morning. Patient will be seen by the office PA to expedite consultation and possible scheduling of bronch. She is now scheduled for this Friday, 12/31/2023. Will follow up once we have pulmonary's plan for biopsy.   Oncology Nurse Navigator Documentation     12/27/2023   11:45 AM  Oncology Nurse Navigator Flowsheets  Abnormal Finding Date 12/22/2023  Diagnosis Status Additional Work Up  Navigator Follow Up Date: 12/31/2023  Navigator Follow Up Reason: Review Note  Navigator Location [facility]  Navigator Encounter Type Appt/Treatment Plan Review  Patient Visit Type MedOnc  Treatment Phase Abnormal Scans  Barriers/Navigation Needs Coordination of Care  Interventions Coordination of Care  Acuity Level 2-Minimal Needs (1-2 Barriers Identified)  Coordination of Care Other  Time Spent with Patient 30

## 2023-12-31 ENCOUNTER — Other Ambulatory Visit: Payer: Self-pay

## 2023-12-31 ENCOUNTER — Encounter (HOSPITAL_COMMUNITY): Payer: Self-pay | Admitting: Emergency Medicine

## 2023-12-31 ENCOUNTER — Ambulatory Visit: Admitting: Acute Care

## 2023-12-31 ENCOUNTER — Encounter: Payer: Self-pay | Admitting: *Deleted

## 2023-12-31 ENCOUNTER — Encounter: Payer: Self-pay | Admitting: Acute Care

## 2023-12-31 VITALS — BP 167/91 | HR 90 | Ht 63.0 in | Wt 103.0 lb

## 2023-12-31 DIAGNOSIS — Z85038 Personal history of other malignant neoplasm of large intestine: Secondary | ICD-10-CM | POA: Diagnosis not present

## 2023-12-31 DIAGNOSIS — F17210 Nicotine dependence, cigarettes, uncomplicated: Secondary | ICD-10-CM | POA: Diagnosis not present

## 2023-12-31 DIAGNOSIS — M533 Sacrococcygeal disorders, not elsewhere classified: Secondary | ICD-10-CM

## 2023-12-31 DIAGNOSIS — R911 Solitary pulmonary nodule: Secondary | ICD-10-CM | POA: Diagnosis not present

## 2023-12-31 DIAGNOSIS — F17200 Nicotine dependence, unspecified, uncomplicated: Secondary | ICD-10-CM

## 2023-12-31 DIAGNOSIS — N1330 Unspecified hydronephrosis: Secondary | ICD-10-CM

## 2023-12-31 DIAGNOSIS — R634 Abnormal weight loss: Secondary | ICD-10-CM

## 2023-12-31 NOTE — H&P (View-Only) (Signed)
 History of Present Illness Regina Young is a 68 y.o. female current every day smoker with a with history of stage 1 colon cancer ( resection ) referred 12/2023 for evaluation of a  1.8 x 1.6 cm soft tissue nodule in the right suprahilar region compatible with abnormal hilar lymph node or central right upper lobe pulmonary nodule.She will be followed by Regina Young.  - History includes stage I colon cancer in 2022 - History of duodenal polyp in August 2014  Pt. Has consented to use of [employer] soft wear to help capture the content of this OV   12/31/2023 Pt. Presents for follow up after referral for a new lung nodule noted in the ED 12/22/2023 after presenting for leg pain and fall.   Regina Young  went to the ED 12/22/2023  after  experiencing worsening severe abdominal pain and pain radiating down her right leg, particularly at night, since February 2025. A CT scan of the abdomen and pelvis on December 22, 2023, revealed a mass on her sacrum and hydronephrosis of one kidney. A CT scan of the chest on December 23, 2023, identified a soft tissue area in her lung concerning for metastatic disease. Vs synchronous primary lung cancer as she is a smoker.  Patient has a history of stage one colon cancer, for which she underwent a resection in June 2022. No chemotherapy was required as the lymph nodes were negative. In August 2024, she had a non-cancerous polyp removed from her colon at [facility].  Since February 2025, she has experienced weight loss, estimating a loss of approximately 10 pounds over two months. She does not keep regular track of her weight but notes the loss has been gradual.  She denies any recent travel, sleep apnea, diabetes, or latex allergy. She is not on any blood thinners, including aspirin, and only takes over-the-counter Tylenol  currently. She is a smoker and wants to quit.  We have discussed the bronchoscopy with biopsies. Both the patient and her husband are in agreement and  want to move forward. We have discussed the risks and benefits of the procedure. They have provided informed consent.We will work to get the PET scan scheduled before the bronc to complete staging.I have confirmed with Endo that the current CT Chest works for navigation if needed.      Test Results: RADIOLOGY CT Chest 12/23/2023 1.8 x 1.6 cm soft tissue nodule in the right suprahilar region compatible with abnormal hilar lymph node or central right upper lobe pulmonary nodule. Imaging features are suspicious for metastatic disease although synchronous primary bronchogenic neoplasm is also a consideration. PET-CT may prove helpful to further evaluate as clinically warranted. No other evidence for metastatic disease in the chest. Right-sided hydronephrosis seen on today's study was better characterized on yesterday's dedicated abdomen and pelvis CT. Emphysema. ([id number])  CT Spine 12/24/2023 Extensive tumor infiltration of the sacrum at the S1 level, asymmetric to the right, and Severely involving the Right S1 nerve, right S1 neural foramen. No other tumor or metastatic disease identified in the Lumbar Spine. Ongoing Right Hydronephrosis and hydroureter.  CT L Spine 12/22/2023 IMPRESSION: 1. 1.6 x 3.3 cm infiltrative soft tissue mass along the right anterolateral aspect of S1 2. Perineural extension into the right S1 neural foramen without involvement of the thecal sac itself. 3. 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 4. Associated malignant erosion of the right anterosuperior aspect of the S1 vertebral body with mild loss of height anteriorly. 5.  The mass is indistinguishable from the adjacent L5-S1 intervertebral disc space though disc space height is preserved. 6. Encasement of the proximal right common iliac artery. 7. These findings would be better assessed with contrast enhanced MRI examination   CT Abdomen and Pelvis 12/22/2023 Recurrent  retroperitoneal soft tissue mass within the retroperitoneum abutting the right anterolateral margin of the S1 vertebral body resulting in malignant destruction of the anterosuperior aspect of the S1 vertebral body and perineural extension into the right S1 neural foramen. Encasement of the proximal right internal iliac artery. 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 Mild hepatic steatosis. Surgical changes of sigmoid colectomy. Aortic Atherosclerosis (ICD10-I70.0).  CT Head 12/22/2023 No acute intracranial abnormality.        Latest Ref Rng & Units 12/26/2023    4:46 AM 12/25/2023    5:25 AM 12/24/2023    8:00 AM  CBC  WBC 4.0 - 10.5 K/uL 6.8  6.4  5.9   Hemoglobin 12.0 - 15.0 g/dL 29.5  62.1  30.8   Hematocrit 36.0 - 46.0 % 37.4  39.4  40.8   Platelets 150 - 400 K/uL 252  333  282        Latest Ref Rng & Units 12/26/2023    4:46 AM 12/25/2023    5:25 AM 12/24/2023    8:00 AM  BMP  Glucose 70 - 99 mg/dL 657  86  80   BUN 8 - 23 mg/dL 20  26  20    Creatinine 0.44 - 1.00 mg/dL 8.46  9.62  9.52   Sodium 135 - 145 mmol/L 139  138  136   Potassium 3.5 - 5.1 mmol/L 3.9  3.8  3.5   Chloride 98 - 111 mmol/L 104  103  99   CO2 22 - 32 mmol/L 25  24  24    Calcium  8.9 - 10.3 mg/dL 9.1  9.0  8.9     BNP No results found for: "BNP"  ProBNP No results found for: "PROBNP"  PFT No results found for: "FEV1PRE", "FEV1POST", "FVCPRE", "FVCPOST", "TLC", "DLCOUNC", "PREFEV1FVCRT", "PSTFEV1FVCRT"  VAS US  CAROTID Result Date: 12/25/2023 Carotid Arterial Duplex Study Patient Name:  Regina Young  Date of Exam:   12/24/2023 Medical Rec #: [id number]      Accession #:    [id number] Date of Birth: 03-18-1956      Patient Gender: F Patient Age:   46 years Exam Location:  [facility] Procedure:      VAS US  CAROTID Referring Phys: Regina Young --------------------------------------------------------------------------------   Indications:       Syncope. Risk Factors:      Hyperlipidemia, current smoker. Limitations        Today's exam was limited due to a VERY high bifurcation of                    the carotid bilaterally. Comparison Study:  No previous exams Performing Technologist: Jody Hill RVT, RDMS  Examination Guidelines: A complete evaluation includes B-mode imaging, spectral Doppler, color Doppler, and power Doppler as needed of all accessible portions of each vessel. Bilateral testing is considered an integral part of a complete examination. Limited examinations for reoccurring indications may be performed as noted.  Right Carotid Findings: +----------+--------+--------+--------+------------------+------------------+           PSV cm/sEDV cm/sStenosisPlaque DescriptionComments           +----------+--------+--------+--------+------------------+------------------+ CCA Prox  73      16  intimal thickening +----------+--------+--------+--------+------------------+------------------+ CCA Distal91      27                                intimal thickening +----------+--------+--------+--------+------------------+------------------+ ICA Prox  59      23                                                   +----------+--------+--------+--------+------------------+------------------+ ICA Distal82      26                                tortuous           +----------+--------+--------+--------+------------------+------------------+ ECA       78      16                                                   +----------+--------+--------+--------+------------------+------------------+ +----------+--------+-------+----------------+-------------------+           PSV cm/sEDV cmsDescribe        Arm Pressure (mmHG) +----------+--------+-------+----------------+-------------------+ [id number]             Multiphasic, WNL                     +----------+--------+-------+----------------+-------------------+ +---------+--------+--+--------+--+---------+ VertebralPSV cm/s43EDV cm/s11Antegrade +---------+--------+--+--------+--+---------+  Left Carotid Findings: +----------+--------+--------+--------+------------------+--------+           PSV cm/sEDV cm/sStenosisPlaque DescriptionComments +----------+--------+--------+--------+------------------+--------+ CCA Prox  83      12                                         +----------+--------+--------+--------+------------------+--------+ CCA Distal63      23                                         +----------+--------+--------+--------+------------------+--------+ ICA Prox  73      29                                         +----------+--------+--------+--------+------------------+--------+ ICA Distal77      26                                         +----------+--------+--------+--------+------------------+--------+ ECA       84      21                                         +----------+--------+--------+--------+------------------+--------+ +----------+--------+--------+----------------+-------------------+           PSV cm/sEDV cm/sDescribe        Arm Pressure (mmHG) +----------+--------+--------+----------------+-------------------+ [id number]  Multiphasic, WNL                    +----------+--------+--------+----------------+-------------------+ +---------+--------+--+--------+--+---------+ VertebralPSV cm/s44EDV cm/s15Antegrade +---------+--------+--+--------+--+---------+   Summary: Right Carotid: The extracranial vessels were near-normal with only minimal wall                thickening or plaque. Left Carotid: The extracranial vessels were near-normal with only minimal wall               thickening or plaque. Vertebrals:  Bilateral vertebral arteries demonstrate antegrade flow. Subclavians: Normal flow hemodynamics were  seen in bilateral subclavian              arteries. *See table(s) above for measurements and observations.  Electronically signed by Ardella Beaver MD on 12/25/2023 at 11:55:45 AM.    Final    ECHOCARDIOGRAM COMPLETE Result Date: 12/24/2023    ECHOCARDIOGRAM REPORT   Patient Name:   GRAISON MACZKO Date of Exam: 12/24/2023 Medical Rec #:  [id number]     Height:       62.0 in Accession #:    [id number]    Weight:       101.0 lb Date of Birth:  01-09-1956     BSA:          1.430 m Patient Age:    67 years      BP:           145/87 mmHg Patient Gender: F             HR:           100 bpm. Exam Location:  Inpatient Procedure: 2D Echo, Cardiac Doppler and Color Doppler (Both Spectral and Color            Flow Doppler were utilized during procedure). Indications:    Syncope R55  History:        Patient has no prior history of Echocardiogram examinations.                 Risk Factors:Dyslipidemia.  Sonographer:    Hersey Lorenzo RDCS Referring Phys: [phone] Regina MANUEL Young IMPRESSIONS  1. Left ventricular ejection fraction, by estimation, is 65 to 70%. The left ventricle has normal function. The left ventricle has no regional wall motion abnormalities.  2. Right ventricular systolic function is normal. The right ventricular size is normal.  3. Trivial mitral valve regurgitation.  4. The aortic valve is normal in structure. Aortic valve regurgitation is trivial.  5. The inferior vena cava is normal in size with greater than 50% respiratory variability, suggesting right atrial pressure of 3 mmHg. FINDINGS  Left Ventricle: Left ventricular ejection fraction, by estimation, is 65 to 70%. The left ventricle has normal function. The left ventricle has no regional wall motion abnormalities. The left ventricular internal cavity size was normal in size. There is  no left ventricular hypertrophy. Right Ventricle: The right ventricular size is normal. Right vetricular wall thickness was not assessed. Right ventricular systolic function  is normal. Left Atrium: Left atrial size was normal in size. Right Atrium: Right atrial size was normal in size. Pericardium: There is no evidence of pericardial effusion. Mitral Valve: There is mild thickening of the mitral valve leaflet(s). Trivial mitral valve regurgitation. Tricuspid Valve: The tricuspid valve is normal in structure. Tricuspid valve regurgitation is mild. Aortic Valve: The aortic valve is normal in structure. Aortic valve regurgitation is trivial. Pulmonic Valve: The pulmonic valve was not well visualized. Pulmonic valve regurgitation is  not visualized. No evidence of pulmonic stenosis. Aorta: The aortic root and ascending aorta are structurally normal, with no evidence of dilitation. Venous: The inferior vena cava is normal in size with greater than 50% respiratory variability, suggesting right atrial pressure of 3 mmHg. IAS/Shunts: No atrial level shunt detected by color flow Doppler.  LEFT VENTRICLE PLAX 2D LVIDd:         4.70 cm   Diastology LVIDs:         2.80 cm   LV e' lateral: 8.49 cm/s LV PW:         1.00 cm LV IVS:        1.00 cm LVOT diam:     2.20 cm LV SV:         62 LV SV Index:   43 LVOT Area:     3.80 cm  RIGHT VENTRICLE             IVC RV S prime:     11.40 cm/s  IVC diam: 1.00 cm TAPSE (M-mode): 1.4 cm LEFT ATRIUM           Index LA diam:      2.40 cm 1.68 cm/m LA Vol (A4C): 9.9 ml  6.91 ml/m  AORTIC VALVE LVOT Vmax:   102.00 cm/s LVOT Vmean:  69.600 cm/s LVOT VTI:    0.163 m  AORTA Ao Root diam: 2.80 cm Ao Asc diam:  3.00 cm  SHUNTS Systemic VTI:  0.16 m Systemic Diam: 2.20 cm Ola Berger MD Electronically signed by Ola Berger MD Signature Date/Time: 12/24/2023/3:19:36 PM    Final    MR Lumbar Spine W Wo Contrast Result Date: 12/24/2023 CLINICAL DATA:  68 year old female with low back pain, rectal cancer, invasive presacral soft tissue mass at the sacral promontory on CT. EXAM: MRI LUMBAR SPINE WITHOUT AND WITH CONTRAST TECHNIQUE: Multiplanar and multiecho pulse sequences  of the lumbar spine were obtained without and with intravenous contrast. CONTRAST:  5mL GADAVIST  GADOBUTROL  1 MMOL/ML IV SOLN COMPARISON:  CT Abdomen and Pelvis, lumbar spine CT 12/22/2023. FINDINGS: Segmentation: Normal on the comparison CT. There is a vestigial S1-S2 disc space. Alignment: Maintained lumbar lordosis. Maintained alignment of the visible sacral segments. Vertebrae: Normal background bone marrow signal, and no acute or suspicious bone lesion identified in the lumbar or visible lower thoracic levels. However, extensive tumor infiltration of the sacrum at the S1 level, throughout the central sacrum there and in the sacral ala right greater than left (series 9, images 35 through 40). This appears associated with indistinct presacral soft tissue. The affected bone is heterogeneously enhancing and edematous. And there is epidural tumor throughout the right S1 neural foramen, inseparable from the nerve there. Left S1 neural foramen relatively spared. No sacral spinal canal involvement. 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 Conus medullaris and cauda equina: Conus extends to the L1-L2 level. No lower spinal cord or conus signal abnormality. Normal cauda equina nerve roots. No abnormal enhancement above the right S1 nerve. No lumbar dural thickening or enhancement. Paraspinal and other soft tissues: Severe right hydronephrosis and hydroureter again noted. Disc levels: No age advanced lower thoracic or lumbar spine degeneration. Capacious spinal canal throughout those levels. Malignant impingement of the right S1 nerve as above. IMPRESSION: 1. 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 2. No other tumor or metastatic disease identified in the Lumbar Spine. 3. Ongoing Right  Hydronephrosis and hydroureter. Electronically Signed   By: Marlise Simpers M.D.   On: 12/24/2023 07:50   CT CHEST W  CONTRAST Result Date: 12/24/2023 CLINICAL DATA:  Rectal cancer. Evaluate for metastases. Restaging. * Tracking Code: BO * EXAM: CT CHEST WITH CONTRAST TECHNIQUE: Multidetector CT imaging of the chest was performed during intravenous contrast administration. RADIATION DOSE REDUCTION: This exam was performed according to the departmental dose-optimization program which includes automated exposure control, adjustment of the mA and/or kV according to patient size and/or use of iterative reconstruction technique. CONTRAST:  75mL OMNIPAQUE  IOHEXOL  300 MG/ML  SOLN COMPARISON:  05/21/2022 FINDINGS: Cardiovascular: The heart size is normal. No substantial pericardial effusion. Ascending thoracic aorta measures 3.2 cm diameter. Mediastinum/Nodes: No mediastinal lymphadenopathy. 1.8 x 1.6 cm soft tissue nodule is identified in the right suprahilar lung (axial soft tissue window 49 of series 2 and axial lung window 48 of series 8) compatible with abnormal hilar lymph node or central right upper lobe pulmonary nodule. No left hilar lymphadenopathy. The esophagus has normal imaging features. There is no axillary lymphadenopathy. Lungs/Pleura: Centrilobular and paraseptal emphysema evident. Biapical pleuroparenchymal scarring evident. As noted above, there is a 1.8 cm nodular lesion in the right suprahilar region (49/8). No other suspicious pulmonary nodule or mass. 3 mm peripheral left upper lobe nodule on 58/8 is stable since the previous study. Subtle 6 mm ground-glass opacity in the left upper lobe on 61/8 is unchanged. No pleural effusion. Upper Abdomen: Scattered tiny hypodensities in the liver parenchyma are too small to characterize but are statistically most likely benign. No followup imaging is recommended. Right-sided hydronephrosis seen on today's study was better characterized on yesterday's dedicated abdomen and pelvis CT. High density material in the dilated right intrarenal collecting system and renal pelvis is  probably residual contrast from the previous CT. Musculoskeletal: No worrisome lytic or sclerotic osseous abnormality. IMPRESSION: 1. 1.8 x 1.6 cm soft tissue nodule in the right suprahilar region compatible with abnormal hilar lymph node or central right upper lobe pulmonary nodule. Imaging features are suspicious for metastatic disease although synchronous primary bronchogenic neoplasm is also a consideration. PET-CT may prove helpful to further evaluate as clinically warranted. 2. No other evidence for metastatic disease in the chest. 3. Right-sided hydronephrosis seen on today's study was better characterized on yesterday's dedicated abdomen and pelvis CT. 4.  Emphysema. ([id number]) Electronically Signed   By: Donnal Fusi M.D.   On: 12/24/2023 06:43   CT L-SPINE NO CHARGE Result Date: 12/22/2023 CLINICAL DATA:  Recurrent right leg pain, right thigh pain, right calf pain. History of colon cancer. * Tracking Code: BO * EXAM: CT LUMBAR SPINE WITHOUT CONTRAST TECHNIQUE: Multidetector CT imaging of the lumbar spine was performed without intravenous contrast administration. Multiplanar CT image reconstructions were also generated. RADIATION DOSE REDUCTION: This exam was performed according to the departmental dose-optimization program which includes automated exposure control, adjustment of the mA and/or kV according to patient size and/or use of iterative reconstruction technique. COMPARISON:  Findings are correlated with concurrently performed CT examination the abdomen and pelvis. FINDINGS: Segmentation: 5 lumbar type vertebrae. Alignment: Normal. Vertebrae: There is malignant erosion of the right anterosuperior aspect of the S1 vertebral body, best appreciated on image # 40/17 secondary superior endplate fracture of S1 with mild loss of height anteriorly. No other fracture identified. Remaining vertebral body height is preserved. Paraspinal and other soft tissues: 1.6 x 3.3 cm infiltrative soft tissue mass  is seen along the right anterolateral aspect of S1 demonstrating  perineural extension into the right S1 neural foramen without involvement of the thecal sac itself. The mass demonstrates encasement of the proximal right common iliac artery, best seen image # 107/20. The mass is indistinguishable from the adjacent L5-S1 intervertebral disc space though disc space height is preserved. 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 Multiple surgical clips are seen in the region of the mass, several of which are encased by the mass. See complete report for CT examination the abdomen and pelvis. Disc levels: Intervertebral disc heights are preserved. Spinal canal is widely patent. No significant neuroforaminal narrowing. IMPRESSION: 1. 1.6 x 3.3 cm infiltrative soft tissue mass along the right anterolateral aspect of S1 2. Perineural extension into the right S1 neural foramen without involvement of the thecal sac itself. 3. 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 4. Associated malignant erosion of the right anterosuperior aspect of the S1 vertebral body with mild loss of height anteriorly. 5. The mass is indistinguishable from the adjacent L5-S1 intervertebral disc space though disc space height is preserved. 6. Encasement of the proximal right common iliac artery. 7. These findings would be better assessed with contrast enhanced MRI examination Electronically Signed   By: Worthy Heads M.D.   On: 12/22/2023 22:18   CT ABDOMEN PELVIS W CONTRAST Result Date: 12/22/2023 CLINICAL DATA:  Abdominal and flank pain, recurrent right leg pain. Colon cancer. * Tracking Code: BO * EXAM: CT ABDOMEN AND PELVIS WITH CONTRAST TECHNIQUE: Multidetector CT imaging of the abdomen and pelvis was performed using the standard protocol following bolus administration of intravenous contrast. RADIATION DOSE REDUCTION: This exam was performed according to the departmental dose-optimization program  which includes automated exposure control, adjustment of the mA and/or kV according to patient size and/or use of iterative reconstruction technique. CONTRAST:  96mL OMNIPAQUE  IOHEXOL  300 MG/ML  SOLN COMPARISON:  02/03/2023, MRI 06/13/2023 FINDINGS: Lower chest: No acute abnormality. Hepatobiliary: Mild hepatic steatosis. Scattered simple hepatic cysts. No enhancing intrahepatic mass. No intra or extrahepatic biliary ductal dilation. Gallbladder unremarkable. Pancreas: Unremarkable Spleen: Numerous previously identified simple cyst within the spleen have decreased in size. No enhancing intra splenic mass. The spleen is normal in size. No perisplenic fluid collection. Adrenals/Urinary Tract: The adrenal glands are unremarkable. 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 This is best seen on axial image # 40/2 and sagittal image # 71/10. Interval development of mild associated right renal cortical atrophy suggesting this represents a mole standing obstruction. Delayed right renal cortical enhancement in keeping with high-grade obstruction. The left kidney is unremarkable. Bladder unremarkable. Stomach/Bowel: Surgical changes of sigmoid colectomy are identified. Stomach, small bowel, and large bowel are otherwise unremarkable there is no evidence of obstruction or focal inflammation. Appendix absent. Numerous surgical clips are seen within the retroperitoneum. Adjacent to these clips is a new infiltrative soft tissue mass measuring at least 4.5 x 1.7 cm at axial image # 38/2 abutting the right anterolateral margin of the S1 vertebral body with malignant destruction of the anterosuperior aspect of the vertebral body best seen on image # 37/2 and perineural extension into the right S1 neural foramen, best seen on image # 36-37/2. The mass is poorly delineated from the adjacent L5-S1  disc space though I suspect this structure is abutted but not directly invaded. Vascular/Lymphatic: Mild aortoiliac atherosclerotic calcification. The previously described recurrence retroperitoneal soft tissue mass abuts  and encases the right internal iliac artery proximally at axial image # 38/2 and likely abuts but does not encase the left common iliac vein. The abdominal vasculature is otherwise unremarkable. Reproductive: Status post hysterectomy. No adnexal masses. Other: No abdominal wall hernia Musculoskeletal: No acute bone abnormality. IMPRESSION: 1. Recurrent retroperitoneal soft tissue mass within the retroperitoneum abutting the right anterolateral margin of the S1 vertebral body resulting in malignant destruction of the anterosuperior aspect of the S1 vertebral body and perineural extension into the right S1 neural foramen. Encasement of the proximal right internal iliac artery. 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 2. Mild hepatic steatosis. 3. Surgical changes of sigmoid colectomy. 4.  Aortic Atherosclerosis (ICD10-I70.0). Electronically Signed   By: Worthy Heads M.D.   On: 12/22/2023 22:09   CT Head Wo Contrast Result Date: 12/22/2023 CLINICAL DATA:  Syncope/presyncope, cerebrovascular cause suspected EXAM: CT HEAD WITHOUT CONTRAST TECHNIQUE: Contiguous axial images were obtained from the base of the skull through the vertex without intravenous contrast. RADIATION DOSE REDUCTION: This exam was performed according to the departmental dose-optimization program which includes automated exposure control, adjustment of the mA and/or kV according to patient size and/or use of iterative reconstruction technique. COMPARISON:  None Available. FINDINGS: Brain: No evidence of large-territorial acute infarction. No parenchymal hemorrhage. No mass lesion. No extra-axial collection. No mass effect or midline shift. No hydrocephalus. Basilar  cisterns are patent. Vascular: No hyperdense vessel. Skull: No acute fracture or focal lesion. Sinuses/Orbits: Paranasal sinuses and mastoid air cells are clear. The orbits are unremarkable. Other: Left parieto-occipital scalp soft tissue density with calcification likely a sebaceous cyst. IMPRESSION: No acute intracranial abnormality. Electronically Signed   By: Morgane  Naveau M.D.   On: 12/22/2023 19:44   XR Pelvis 1-2 Views Result Date: 12/15/2023 AP pelvis some degenerative changes of the hips by back laterally with some sclerotic changes in the superior acetabulum no acute fractures  XR Lumbar Spine 2-3 Views Result Date: 12/15/2023 Radiographs of the lumbar spine demonstrate overall well-maintained alignment some endplate sclerotic changes and some facet arthropathy no acute fractures degenerative changes noted mostly at L5-S1    Past medical hx Past Medical History:  Diagnosis Date   Cancer of sigmoid colon, Stage 1, pT1pN0, s/p robotic LAR resection 02/21/2021 02/21/2021   Hyperlipidemia      Social History   Tobacco Use   Smoking status: Every Day    Current packs/day: 0.50    Average packs/day: 0.5 packs/day for 25.0 years (12.5 ttl pk-yrs)    Types: Cigarettes    Passive exposure: Current   Smokeless tobacco: Never  Vaping Use   Vaping status: Never Used  Substance Use Topics   Alcohol use: Yes    Comment: occas.   Drug use: Never    Ms.Sproule reports that she has been smoking cigarettes. She has a 12.5 pack-year smoking history. She has been exposed to tobacco smoke. She has never used smokeless tobacco. She reports current alcohol use. She reports that she does not use drugs.  Tobacco Cessation: Ready to quit: Not Answered Counseling given: Not Answered Current every day smoker  Counseled to quit completely  Past surgical hx, Family hx, Social hx all reviewed.  Current Outpatient Medications on File Prior to Visit  Medication Sig   acetaminophen  (TYLENOL ) 500  MG tablet Take 500-1,000 mg by mouth every 6 (six) hours as needed (PAIN).   alendronate (FOSAMAX) 70 MG tablet Take 70 mg by mouth once  a week. 1 tablet 30 minutes before the first food, beverage or medicine of the day with plain water Orally once weekly for 84 days   Calcium  Citrate 250 MG TABS Take 1 tablet by mouth daily at 12 noon.   Cholecalciferol 50 MCG (2000 UT) CAPS Take 1 capsule by mouth daily at 12 noon.   cyanocobalamin  (VITAMIN B12) 1000 MCG tablet Take 1,000 mcg by mouth daily.   dexamethasone  (DECADRON ) 1 MG tablet Take 4 tablets (4 mg total) by mouth daily for 5 days, THEN 2 tablets (2 mg total) daily for 5 days, THEN 1 tablet (1 mg total) daily for 5 days. Follow up with oncology outpatient regarding ongoing steroids.   gabapentin  (NEURONTIN ) 300 MG capsule Take 1 capsule (300 mg total) by mouth 3 (three) times daily.   omeprazole 20 MG TBDD disintegrating tablet Take 20 mg by mouth daily.   rosuvastatin  (CRESTOR ) 5 MG tablet Take 5 mg by mouth in the morning. (0800)   No current facility-administered medications on file prior to visit.     No Known Allergies  Review Of Systems:  Constitutional:   + weight loss, No night sweats,  Fevers, chills, fatigue, or  lassitude.  HEENT:   No headaches,  Difficulty swallowing,  Tooth/dental problems, or  Sore throat,                No sneezing, itching, ear ache, nasal congestion, post nasal drip,   CV:  No chest pain,  Orthopnea, PND, swelling in lower extremities, anasarca, dizziness, palpitations, syncope.   GI  + heartburn, indigestion, abdominal pain, No nausea, vomiting, diarrhea, change in bowel habits, loss of appetite, bloody stools.   Resp: No shortness of breath with exertion or at rest.  No excess mucus, no productive cough,  No non-productive cough,  No coughing up of blood.  No change in color of mucus.  No wheezing.  No chest wall deformity  Skin: no rash or lesions.  GU: no dysuria, change in color of urine, no  urgency or frequency.  No flank pain, no hematuria   MS:  + joint pain or swelling.  No decreased range of motion.  + back pain.  Psych:  No change in mood or affect. No depression + anxiety with recent diagnosis.  No memory loss.   Vital Signs BP (!) 167/91 (BP Location: Left Arm, Patient Position: Sitting, Cuff Size: Normal)   Pulse 90   Ht 5\' 3"  (1.6 m)   Wt 103 lb (46.7 kg)   SpO2 96%   BMI 18.25 kg/m    Physical Exam:  General- No distress,  A&Ox3, pleasant ENT: No sinus tenderness, TM clear, pale nasal mucosa, no oral exudate,no post nasal drip, no LAN Cardiac: S1, S2, regular rate and rhythm, no murmur Chest: No wheeze/ rales/ dullness; no accessory muscle use, no nasal flaring, no sternal retractions, slightly diminished per bases Abd.: Soft Non-tender, ND, BS +, Body mass index is 18.25 kg/m.  Ext: No clubbing cyanosis, edema, no obvious deformities Neuro:  normal strength, MAE x 4, A&O x 3, appropriate Skin: No rashes, warm and dry, no obvious lesions  Psych: normal mood and behavior   Assessment/Plan Lung nodule Lung nodule identified on CT.  Differential includes primary lung cancer or metastasis.  Smoking history increases primary lung cancer risk.  Plan -We have discussed the procedure in detail.  We have reviewed the risks and benefits of the procedure. These include bleeding, infection, puncture of the lung, and  adverse reaction to anesthesia. You have agreed to proceed with biopsy to evaluate the right lymph node Your procedure will be done by Dr. Racheal Buddle. You will receive a letter today with date time and information pertaining to the procedure. You will need someone to drive you to the procedure, stay with you during the procedure, and stay with you after the procedure. You will also need someone to stay with you for 24 hours after anesthesia to ensure you have cleared and are doing well. You will follow-up with me 1 week after the procedure to  review the results and to ensure you are doing well. I have also ordered a PET scan to be done as soon as possible. This will allow us  to complete staging in the event this is a cancer. You will get a call to get this scheduled.  Call if you need us  prior to the procedure or if you have any questions at all. Please contact office for sooner follow up if symptoms do not improve or worsen or seek emergency care   Please work on quitting smoking. You can receive free nicotine replacement therapy ( patches, gum or mints) by calling 1-800-QUIT NOW. Please call so we can get you on the path to becoming  a non-smoker. I know it is hard, but you can do this! Other options for assistance in smoking cessation ( As covered by your insurance benefits)  Hypnosis for smoking cessation  [employer] [phone]  Acupuncture for smoking cessation  [employer] [phone]         Unintentional Weight loss 10-pound weight loss over two months concerning for malignancy as cause. Plan - Suspect related to malignant process. - Consider Boost or ensure for nutritional supplement  Mass on sacrum Mass on sacrum identified. Etiology unknown, could be primary tumor or metastasis from colon. - PET to evaluate for staging - Referral to appropriate specialty for treatment based on tissue diagnosis results  Colon cancer Stage 1 colon cancer resected in June 2022. No lymph node involvement, no adjuvant chemotherapy. Current findings could represent metastasis. Plan Biopsy of lung to determine if metastatic colon cancer vs new Primary lung cancer Appropriate referral will be made once biopsy results confirm diagnosis  Hydronephrosis Hydronephrosis noted, possibly due to ureteral obstruction. - Scheduled nephrology evaluation on Jan 13, 2024.   I spent 45 minutes dedicated to the care of this patient on the date of this encounter to include pre-visit review of records, face-to-face  time with the patient discussing conditions above, post visit ordering of testing, clinical documentation with the electronic health record, making appropriate referrals as documented, and communicating necessary information to the patient's healthcare team.    Raejean Bullock, NP 12/31/2023  8:55 AM

## 2023-12-31 NOTE — Progress Notes (Signed)
 Patient with a history of colon cancer admitted to the hospital and found to have a retroperitoneal and lung mass. Biopsy needed to determine recurrence vs new primary. Seen by pulmonary today and plan for PET on 01/03/2024 and Bronch on 01/04/2024.  Reached out to Alger Anthony to introduce myself as the office RN Navigator and explain our new patient process. Reviewed the reason for their referral and scheduled their new patient appointment along with labs. Provided address and directions to the office including call back phone number. Reviewed with patient any concerns they may have or any possible barriers to attending their appointment.   Informed patient about my role as a navigator and that I will meet with them prior to their New Patient appointment and more fully discuss what services I can provide. At this time patient has no further questions or needs.   Oncology Nurse Navigator Documentation     12/31/2023   10:00 AM  Oncology Nurse Navigator Flowsheets  Navigator Follow Up Date: 01/03/2024  Navigator Follow Up Reason: Scan Review  Navigator Location [facility]  Navigator Encounter Type Introductory Phone Call  Patient Visit Type MedOnc  Treatment Phase Abnormal Scans  Barriers/Navigation Needs Coordination of Care  Interventions Coordination of Care;Education  Acuity Level 2-Minimal Needs (1-2 Barriers Identified)  Coordination of Care Appts  Education Method Verbal  Support Groups/Services Friends and Family  Time Spent with Patient 30

## 2023-12-31 NOTE — Patient Instructions (Addendum)
 I have placed an order for a bronchoscopy with biopsies.  We have discussed the procedure in detail.  We have reviewed the risks and benefits of the procedure. These include bleeding, infection, puncture of the lung, and adverse reaction to anesthesia. You have agreed to proceed with biopsy to evaluate the right lymph node Your procedure will be done by Dr. Racheal Buddle. You will receive a letter today with date time and information pertaining to the procedure. You will need someone to drive you to the procedure, stay with you during the procedure, and stay with you after the procedure. You will also need someone to stay with you for 24 hours after anesthesia to ensure you have cleared and are doing well. You will follow-up with me 1 week after the procedure to review the results and to ensure you are doing well. I have also ordered a PET scan to be done as soon as possible. This will allow us  to complete staging in the event this is a cancer. You will get a call to get this scheduled.  Call if you need us  prior to the procedure or if you have any questions at all. Please contact office for sooner follow up if symptoms do not improve or worsen or seek emergency care   Please work on quitting smoking. You can receive free nicotine replacement therapy ( patches, gum or mints) by calling 1-800-QUIT NOW. Please call so we can get you on the path to becoming  a non-smoker. I know it is hard, but you can do this!  Other options for assistance in smoking cessation ( As covered by your insurance benefits)  Hypnosis for smoking cessation  [employer] [phone]  Acupuncture for smoking cessation  [employer] [phone]

## 2023-12-31 NOTE — Progress Notes (Signed)
 SDW call  Patient was given pre-op instructions over the phone. Patient verbalized understanding of instructions provided.     PCP - Dr. Alvie Ax Cardiologist - denies Pulmonary:    PPM/ICD - denies Device Orders - na Rep Notified - na   Chest x-ray - na EKG -  12/24/2023 Stress Test - ECHO -  Cardiac Cath -   Sleep Study/sleep apnea/CPAP: denies  Non-diabetic  Blood Thinner Instructions: denies  Aspirin Instructions:denies  ERAS Protcol - NPO   Anesthesia review: No   Patient denies shortness of breath, fever, cough and chest pain over the phone call  Your procedure is scheduled on Tuesday January 04, 2024  Report to [facility] Main Entrance "A" at  0645  A.M., then check in with the Admitting office.  Call this number if you have problems the morning of surgery:  [phone]   If you have any questions prior to your surgery date call [phone]: Open Monday-Friday 8am-4pm If you experience any cold or flu symptoms such as cough, fever, chills, shortness of breath, etc. between now and your scheduled surgery, please notify us  at the above number     Remember:  Do not eat or drink after midnight the night before your surgery  Take these medicines the morning of surgery with A SIP OF WATER:  Decadron , gabapentin , omeprazole, crestor   As needed: tylenol   As of today, STOP taking any Aspirin (unless otherwise instructed by your surgeon) Aleve, Naproxen, Ibuprofen, Motrin, Advil, Goody's, BC's, all herbal medications, fish oil, and all vitamins.

## 2023-12-31 NOTE — Progress Notes (Signed)
 History of Present Illness Regina Young is a 68 y.o. female current every day smoker with a with history of stage 1 colon cancer ( resection ) referred 12/2023 for evaluation of a  1.8 x 1.6 cm soft tissue nodule in the right suprahilar region compatible with abnormal hilar lymph node or central right upper lobe pulmonary nodule.She will be followed by Dr. Baldwin Levee.  - History includes stage I colon cancer in 2022 - History of duodenal polyp in August 2014  Pt. Has consented to use of [employer] soft wear to help capture the content of this OV   12/31/2023 Pt. Presents for follow up after referral for a new lung nodule noted in the ED 12/22/2023 after presenting for leg pain and fall.   Regina Young  went to the ED 12/22/2023  after  experiencing worsening severe abdominal pain and pain radiating down her right leg, particularly at night, since February 2025. A CT scan of the abdomen and pelvis on December 22, 2023, revealed a mass on her sacrum and hydronephrosis of one kidney. A CT scan of the chest on December 23, 2023, identified a soft tissue area in her lung concerning for metastatic disease. Vs synchronous primary lung cancer as she is a smoker.  Patient has a history of stage one colon cancer, for which she underwent a resection in June 2022. No chemotherapy was required as the lymph nodes were negative. In August 2024, she had a non-cancerous polyp removed from her colon at [facility].  Since February 2025, she has experienced weight loss, estimating a loss of approximately 10 pounds over two months. She does not keep regular track of her weight but notes the loss has been gradual.  She denies any recent travel, sleep apnea, diabetes, or latex allergy. She is not on any blood thinners, including aspirin, and only takes over-the-counter Tylenol  currently. She is a smoker and wants to quit.  We have discussed the bronchoscopy with biopsies. Both the patient and her husband are in agreement and  want to move forward. We have discussed the risks and benefits of the procedure. They have provided informed consent.We will work to get the PET scan scheduled before the bronc to complete staging.I have confirmed with Endo that the current CT Chest works for navigation if needed.      Test Results: RADIOLOGY CT Chest 12/23/2023 1.8 x 1.6 cm soft tissue nodule in the right suprahilar region compatible with abnormal hilar lymph node or central right upper lobe pulmonary nodule. Imaging features are suspicious for metastatic disease although synchronous primary bronchogenic neoplasm is also a consideration. PET-CT may prove helpful to further evaluate as clinically warranted. No other evidence for metastatic disease in the chest. Right-sided hydronephrosis seen on today's study was better characterized on yesterday's dedicated abdomen and pelvis CT. Emphysema. ([id number])  CT Spine 12/24/2023 Extensive tumor infiltration of the sacrum at the S1 level, asymmetric to the right, and Severely involving the Right S1 nerve, right S1 neural foramen. No other tumor or metastatic disease identified in the Lumbar Spine. Ongoing Right Hydronephrosis and hydroureter.  CT L Spine 12/22/2023 IMPRESSION: 1. 1.6 x 3.3 cm infiltrative soft tissue mass along the right anterolateral aspect of S1 2. Perineural extension into the right S1 neural foramen without involvement of the thecal sac itself. 3. 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 4. Associated malignant erosion of the right anterosuperior aspect of the S1 vertebral body with mild loss of height anteriorly. 5.  The mass is indistinguishable from the adjacent L5-S1 intervertebral disc space though disc space height is preserved. 6. Encasement of the proximal right common iliac artery. 7. These findings would be better assessed with contrast enhanced MRI examination   CT Abdomen and Pelvis 12/22/2023 Recurrent  retroperitoneal soft tissue mass within the retroperitoneum abutting the right anterolateral margin of the S1 vertebral body resulting in malignant destruction of the anterosuperior aspect of the S1 vertebral body and perineural extension into the right S1 neural foramen. Encasement of the proximal right internal iliac artery. 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 Mild hepatic steatosis. Surgical changes of sigmoid colectomy. Aortic Atherosclerosis (ICD10-I70.0).  CT Head 12/22/2023 No acute intracranial abnormality.        Latest Ref Rng & Units 12/26/2023    4:46 AM 12/25/2023    5:25 AM 12/24/2023    8:00 AM  CBC  WBC 4.0 - 10.5 K/uL 6.8  6.4  5.9   Hemoglobin 12.0 - 15.0 g/dL 40.9  81.1  91.4   Hematocrit 36.0 - 46.0 % 37.4  39.4  40.8   Platelets 150 - 400 K/uL 252  333  282        Latest Ref Rng & Units 12/26/2023    4:46 AM 12/25/2023    5:25 AM 12/24/2023    8:00 AM  BMP  Glucose 70 - 99 mg/dL 782  86  80   BUN 8 - 23 mg/dL 20  26  20    Creatinine 0.44 - 1.00 mg/dL 9.56  2.13  0.86   Sodium 135 - 145 mmol/L 139  138  136   Potassium 3.5 - 5.1 mmol/L 3.9  3.8  3.5   Chloride 98 - 111 mmol/L 104  103  99   CO2 22 - 32 mmol/L 25  24  24    Calcium  8.9 - 10.3 mg/dL 9.1  9.0  8.9     BNP No results found for: "BNP"  ProBNP No results found for: "PROBNP"  PFT No results found for: "FEV1PRE", "FEV1POST", "FVCPRE", "FVCPOST", "TLC", "DLCOUNC", "PREFEV1FVCRT", "PSTFEV1FVCRT"  VAS US  CAROTID Result Date: 12/25/2023 Carotid Arterial Duplex Study Patient Name:  Regina Young  Date of Exam:   12/24/2023 Medical Rec #: [id number]      Accession #:    [id number] Date of Birth: Oct 16, 1955      Patient Gender: F Patient Age:   61 years Exam Location:  [facility] Procedure:      VAS US  CAROTID Referring Phys: Myrtie Atkinson ORTIZ --------------------------------------------------------------------------------   Indications:       Syncope. Risk Factors:      Hyperlipidemia, current smoker. Limitations        Today's exam was limited due to a VERY high bifurcation of                    the carotid bilaterally. Comparison Study:  No previous exams Performing Technologist: Jody Hill RVT, RDMS  Examination Guidelines: A complete evaluation includes B-mode imaging, spectral Doppler, color Doppler, and power Doppler as needed of all accessible portions of each vessel. Bilateral testing is considered an integral part of a complete examination. Limited examinations for reoccurring indications may be performed as noted.  Right Carotid Findings: +----------+--------+--------+--------+------------------+------------------+           PSV cm/sEDV cm/sStenosisPlaque DescriptionComments           +----------+--------+--------+--------+------------------+------------------+ CCA Prox  73      16  intimal thickening +----------+--------+--------+--------+------------------+------------------+ CCA Distal91      27                                intimal thickening +----------+--------+--------+--------+------------------+------------------+ ICA Prox  59      23                                                   +----------+--------+--------+--------+------------------+------------------+ ICA Distal82      26                                tortuous           +----------+--------+--------+--------+------------------+------------------+ ECA       78      16                                                   +----------+--------+--------+--------+------------------+------------------+ +----------+--------+-------+----------------+-------------------+           PSV cm/sEDV cmsDescribe        Arm Pressure (mmHG) +----------+--------+-------+----------------+-------------------+ [id number]             Multiphasic, WNL                     +----------+--------+-------+----------------+-------------------+ +---------+--------+--+--------+--+---------+ VertebralPSV cm/s43EDV cm/s11Antegrade +---------+--------+--+--------+--+---------+  Left Carotid Findings: +----------+--------+--------+--------+------------------+--------+           PSV cm/sEDV cm/sStenosisPlaque DescriptionComments +----------+--------+--------+--------+------------------+--------+ CCA Prox  83      12                                         +----------+--------+--------+--------+------------------+--------+ CCA Distal63      23                                         +----------+--------+--------+--------+------------------+--------+ ICA Prox  73      29                                         +----------+--------+--------+--------+------------------+--------+ ICA Distal77      26                                         +----------+--------+--------+--------+------------------+--------+ ECA       84      21                                         +----------+--------+--------+--------+------------------+--------+ +----------+--------+--------+----------------+-------------------+           PSV cm/sEDV cm/sDescribe        Arm Pressure (mmHG) +----------+--------+--------+----------------+-------------------+ [id number]  Multiphasic, WNL                    +----------+--------+--------+----------------+-------------------+ +---------+--------+--+--------+--+---------+ VertebralPSV cm/s44EDV cm/s15Antegrade +---------+--------+--+--------+--+---------+   Summary: Right Carotid: The extracranial vessels were near-normal with only minimal wall                thickening or plaque. Left Carotid: The extracranial vessels were near-normal with only minimal wall               thickening or plaque. Vertebrals:  Bilateral vertebral arteries demonstrate antegrade flow. Subclavians: Normal flow hemodynamics were  seen in bilateral subclavian              arteries. *See table(s) above for measurements and observations.  Electronically signed by Ardella Beaver MD on 12/25/2023 at 11:55:45 AM.    Final    ECHOCARDIOGRAM COMPLETE Result Date: 12/24/2023    ECHOCARDIOGRAM REPORT   Patient Name:   GRAISON MACZKO Date of Exam: 12/24/2023 Medical Rec #:  [id number]     Height:       62.0 in Accession #:    [id number]    Weight:       101.0 lb Date of Birth:  01-09-1956     BSA:          1.430 m Patient Age:    67 years      BP:           145/87 mmHg Patient Gender: F             HR:           100 bpm. Exam Location:  Inpatient Procedure: 2D Echo, Cardiac Doppler and Color Doppler (Both Spectral and Color            Flow Doppler were utilized during procedure). Indications:    Syncope R55  History:        Patient has no prior history of Echocardiogram examinations.                 Risk Factors:Dyslipidemia.  Sonographer:    Hersey Lorenzo RDCS Referring Phys: [phone] DAVID MANUEL ORTIZ IMPRESSIONS  1. Left ventricular ejection fraction, by estimation, is 65 to 70%. The left ventricle has normal function. The left ventricle has no regional wall motion abnormalities.  2. Right ventricular systolic function is normal. The right ventricular size is normal.  3. Trivial mitral valve regurgitation.  4. The aortic valve is normal in structure. Aortic valve regurgitation is trivial.  5. The inferior vena cava is normal in size with greater than 50% respiratory variability, suggesting right atrial pressure of 3 mmHg. FINDINGS  Left Ventricle: Left ventricular ejection fraction, by estimation, is 65 to 70%. The left ventricle has normal function. The left ventricle has no regional wall motion abnormalities. The left ventricular internal cavity size was normal in size. There is  no left ventricular hypertrophy. Right Ventricle: The right ventricular size is normal. Right vetricular wall thickness was not assessed. Right ventricular systolic function  is normal. Left Atrium: Left atrial size was normal in size. Right Atrium: Right atrial size was normal in size. Pericardium: There is no evidence of pericardial effusion. Mitral Valve: There is mild thickening of the mitral valve leaflet(s). Trivial mitral valve regurgitation. Tricuspid Valve: The tricuspid valve is normal in structure. Tricuspid valve regurgitation is mild. Aortic Valve: The aortic valve is normal in structure. Aortic valve regurgitation is trivial. Pulmonic Valve: The pulmonic valve was not well visualized. Pulmonic valve regurgitation is  not visualized. No evidence of pulmonic stenosis. Aorta: The aortic root and ascending aorta are structurally normal, with no evidence of dilitation. Venous: The inferior vena cava is normal in size with greater than 50% respiratory variability, suggesting right atrial pressure of 3 mmHg. IAS/Shunts: No atrial level shunt detected by color flow Doppler.  LEFT VENTRICLE PLAX 2D LVIDd:         4.70 cm   Diastology LVIDs:         2.80 cm   LV e' lateral: 8.49 cm/s LV PW:         1.00 cm LV IVS:        1.00 cm LVOT diam:     2.20 cm LV SV:         62 LV SV Index:   43 LVOT Area:     3.80 cm  RIGHT VENTRICLE             IVC RV S prime:     11.40 cm/s  IVC diam: 1.00 cm TAPSE (M-mode): 1.4 cm LEFT ATRIUM           Index LA diam:      2.40 cm 1.68 cm/m LA Vol (A4C): 9.9 ml  6.91 ml/m  AORTIC VALVE LVOT Vmax:   102.00 cm/s LVOT Vmean:  69.600 cm/s LVOT VTI:    0.163 m  AORTA Ao Root diam: 2.80 cm Ao Asc diam:  3.00 cm  SHUNTS Systemic VTI:  0.16 m Systemic Diam: 2.20 cm Ola Berger MD Electronically signed by Ola Berger MD Signature Date/Time: 12/24/2023/3:19:36 PM    Final    MR Lumbar Spine W Wo Contrast Result Date: 12/24/2023 CLINICAL DATA:  68 year old female with low back pain, rectal cancer, invasive presacral soft tissue mass at the sacral promontory on CT. EXAM: MRI LUMBAR SPINE WITHOUT AND WITH CONTRAST TECHNIQUE: Multiplanar and multiecho pulse sequences  of the lumbar spine were obtained without and with intravenous contrast. CONTRAST:  5mL GADAVIST  GADOBUTROL  1 MMOL/ML IV SOLN COMPARISON:  CT Abdomen and Pelvis, lumbar spine CT 12/22/2023. FINDINGS: Segmentation: Normal on the comparison CT. There is a vestigial S1-S2 disc space. Alignment: Maintained lumbar lordosis. Maintained alignment of the visible sacral segments. Vertebrae: Normal background bone marrow signal, and no acute or suspicious bone lesion identified in the lumbar or visible lower thoracic levels. However, extensive tumor infiltration of the sacrum at the S1 level, throughout the central sacrum there and in the sacral ala right greater than left (series 9, images 35 through 40). This appears associated with indistinct presacral soft tissue. The affected bone is heterogeneously enhancing and edematous. And there is epidural tumor throughout the right S1 neural foramen, inseparable from the nerve there. Left S1 neural foramen relatively spared. No sacral spinal canal involvement. 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 Conus medullaris and cauda equina: Conus extends to the L1-L2 level. No lower spinal cord or conus signal abnormality. Normal cauda equina nerve roots. No abnormal enhancement above the right S1 nerve. No lumbar dural thickening or enhancement. Paraspinal and other soft tissues: Severe right hydronephrosis and hydroureter again noted. Disc levels: No age advanced lower thoracic or lumbar spine degeneration. Capacious spinal canal throughout those levels. Malignant impingement of the right S1 nerve as above. IMPRESSION: 1. 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 2. No other tumor or metastatic disease identified in the Lumbar Spine. 3. Ongoing Right  Hydronephrosis and hydroureter. Electronically Signed   By: Marlise Simpers M.D.   On: 12/24/2023 07:50   CT CHEST W  CONTRAST Result Date: 12/24/2023 CLINICAL DATA:  Rectal cancer. Evaluate for metastases. Restaging. * Tracking Code: BO * EXAM: CT CHEST WITH CONTRAST TECHNIQUE: Multidetector CT imaging of the chest was performed during intravenous contrast administration. RADIATION DOSE REDUCTION: This exam was performed according to the departmental dose-optimization program which includes automated exposure control, adjustment of the mA and/or kV according to patient size and/or use of iterative reconstruction technique. CONTRAST:  75mL OMNIPAQUE  IOHEXOL  300 MG/ML  SOLN COMPARISON:  05/21/2022 FINDINGS: Cardiovascular: The heart size is normal. No substantial pericardial effusion. Ascending thoracic aorta measures 3.2 cm diameter. Mediastinum/Nodes: No mediastinal lymphadenopathy. 1.8 x 1.6 cm soft tissue nodule is identified in the right suprahilar lung (axial soft tissue window 49 of series 2 and axial lung window 48 of series 8) compatible with abnormal hilar lymph node or central right upper lobe pulmonary nodule. No left hilar lymphadenopathy. The esophagus has normal imaging features. There is no axillary lymphadenopathy. Lungs/Pleura: Centrilobular and paraseptal emphysema evident. Biapical pleuroparenchymal scarring evident. As noted above, there is a 1.8 cm nodular lesion in the right suprahilar region (49/8). No other suspicious pulmonary nodule or mass. 3 mm peripheral left upper lobe nodule on 58/8 is stable since the previous study. Subtle 6 mm ground-glass opacity in the left upper lobe on 61/8 is unchanged. No pleural effusion. Upper Abdomen: Scattered tiny hypodensities in the liver parenchyma are too small to characterize but are statistically most likely benign. No followup imaging is recommended. Right-sided hydronephrosis seen on today's study was better characterized on yesterday's dedicated abdomen and pelvis CT. High density material in the dilated right intrarenal collecting system and renal pelvis is  probably residual contrast from the previous CT. Musculoskeletal: No worrisome lytic or sclerotic osseous abnormality. IMPRESSION: 1. 1.8 x 1.6 cm soft tissue nodule in the right suprahilar region compatible with abnormal hilar lymph node or central right upper lobe pulmonary nodule. Imaging features are suspicious for metastatic disease although synchronous primary bronchogenic neoplasm is also a consideration. PET-CT may prove helpful to further evaluate as clinically warranted. 2. No other evidence for metastatic disease in the chest. 3. Right-sided hydronephrosis seen on today's study was better characterized on yesterday's dedicated abdomen and pelvis CT. 4.  Emphysema. ([id number]-U04.9) Electronically Signed   By: Donnal Fusi M.D.   On: 12/24/2023 06:43   CT L-SPINE NO CHARGE Result Date: 12/22/2023 CLINICAL DATA:  Recurrent right leg pain, right thigh pain, right calf pain. History of colon cancer. * Tracking Code: BO * EXAM: CT LUMBAR SPINE WITHOUT CONTRAST TECHNIQUE: Multidetector CT imaging of the lumbar spine was performed without intravenous contrast administration. Multiplanar CT image reconstructions were also generated. RADIATION DOSE REDUCTION: This exam was performed according to the departmental dose-optimization program which includes automated exposure control, adjustment of the mA and/or kV according to patient size and/or use of iterative reconstruction technique. COMPARISON:  Findings are correlated with concurrently performed CT examination the abdomen and pelvis. FINDINGS: Segmentation: 5 lumbar type vertebrae. Alignment: Normal. Vertebrae: There is malignant erosion of the right anterosuperior aspect of the S1 vertebral body, best appreciated on image # 40/17 secondary superior endplate fracture of S1 with mild loss of height anteriorly. No other fracture identified. Remaining vertebral body height is preserved. Paraspinal and other soft tissues: 1.6 x 3.3 cm infiltrative soft tissue mass  is seen along the right anterolateral aspect of S1 demonstrating  perineural extension into the right S1 neural foramen without involvement of the thecal sac itself. The mass demonstrates encasement of the proximal right common iliac artery, best seen image # 107/20. The mass is indistinguishable from the adjacent L5-S1 intervertebral disc space though disc space height is preserved. 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 Multiple surgical clips are seen in the region of the mass, several of which are encased by the mass. See complete report for CT examination the abdomen and pelvis. Disc levels: Intervertebral disc heights are preserved. Spinal canal is widely patent. No significant neuroforaminal narrowing. IMPRESSION: 1. 1.6 x 3.3 cm infiltrative soft tissue mass along the right anterolateral aspect of S1 2. Perineural extension into the right S1 neural foramen without involvement of the thecal sac itself. 3. 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 4. Associated malignant erosion of the right anterosuperior aspect of the S1 vertebral body with mild loss of height anteriorly. 5. The mass is indistinguishable from the adjacent L5-S1 intervertebral disc space though disc space height is preserved. 6. Encasement of the proximal right common iliac artery. 7. These findings would be better assessed with contrast enhanced MRI examination Electronically Signed   By: Worthy Heads M.D.   On: 12/22/2023 22:18   CT ABDOMEN PELVIS W CONTRAST Result Date: 12/22/2023 CLINICAL DATA:  Abdominal and flank pain, recurrent right leg pain. Colon cancer. * Tracking Code: BO * EXAM: CT ABDOMEN AND PELVIS WITH CONTRAST TECHNIQUE: Multidetector CT imaging of the abdomen and pelvis was performed using the standard protocol following bolus administration of intravenous contrast. RADIATION DOSE REDUCTION: This exam was performed according to the departmental dose-optimization program  which includes automated exposure control, adjustment of the mA and/or kV according to patient size and/or use of iterative reconstruction technique. CONTRAST:  96mL OMNIPAQUE  IOHEXOL  300 MG/ML  SOLN COMPARISON:  02/03/2023, MRI 06/13/2023 FINDINGS: Lower chest: No acute abnormality. Hepatobiliary: Mild hepatic steatosis. Scattered simple hepatic cysts. No enhancing intrahepatic mass. No intra or extrahepatic biliary ductal dilation. Gallbladder unremarkable. Pancreas: Unremarkable Spleen: Numerous previously identified simple cyst within the spleen have decreased in size. No enhancing intra splenic mass. The spleen is normal in size. No perisplenic fluid collection. Adrenals/Urinary Tract: The adrenal glands are unremarkable. 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 This is best seen on axial image # 40/2 and sagittal image # 71/10. Interval development of mild associated right renal cortical atrophy suggesting this represents a mole standing obstruction. Delayed right renal cortical enhancement in keeping with high-grade obstruction. The left kidney is unremarkable. Bladder unremarkable. Stomach/Bowel: Surgical changes of sigmoid colectomy are identified. Stomach, small bowel, and large bowel are otherwise unremarkable there is no evidence of obstruction or focal inflammation. Appendix absent. Numerous surgical clips are seen within the retroperitoneum. Adjacent to these clips is a new infiltrative soft tissue mass measuring at least 4.5 x 1.7 cm at axial image # 38/2 abutting the right anterolateral margin of the S1 vertebral body with malignant destruction of the anterosuperior aspect of the vertebral body best seen on image # 37/2 and perineural extension into the right S1 neural foramen, best seen on image # 36-37/2. The mass is poorly delineated from the adjacent L5-S1  disc space though I suspect this structure is abutted but not directly invaded. Vascular/Lymphatic: Mild aortoiliac atherosclerotic calcification. The previously described recurrence retroperitoneal soft tissue mass abuts  and encases the right internal iliac artery proximally at axial image # 38/2 and likely abuts but does not encase the left common iliac vein. The abdominal vasculature is otherwise unremarkable. Reproductive: Status post hysterectomy. No adnexal masses. Other: No abdominal wall hernia Musculoskeletal: No acute bone abnormality. IMPRESSION: 1. Recurrent retroperitoneal soft tissue mass within the retroperitoneum abutting the right anterolateral margin of the S1 vertebral body resulting in malignant destruction of the anterosuperior aspect of the S1 vertebral body and perineural extension into the right S1 neural foramen. Encasement of the proximal right internal iliac artery. 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 2. Mild hepatic steatosis. 3. Surgical changes of sigmoid colectomy. 4.  Aortic Atherosclerosis (ICD10-I70.0). Electronically Signed   By: Worthy Heads M.D.   On: 12/22/2023 22:09   CT Head Wo Contrast Result Date: 12/22/2023 CLINICAL DATA:  Syncope/presyncope, cerebrovascular cause suspected EXAM: CT HEAD WITHOUT CONTRAST TECHNIQUE: Contiguous axial images were obtained from the base of the skull through the vertex without intravenous contrast. RADIATION DOSE REDUCTION: This exam was performed according to the departmental dose-optimization program which includes automated exposure control, adjustment of the mA and/or kV according to patient size and/or use of iterative reconstruction technique. COMPARISON:  None Available. FINDINGS: Brain: No evidence of large-territorial acute infarction. No parenchymal hemorrhage. No mass lesion. No extra-axial collection. No mass effect or midline shift. No hydrocephalus. Basilar  cisterns are patent. Vascular: No hyperdense vessel. Skull: No acute fracture or focal lesion. Sinuses/Orbits: Paranasal sinuses and mastoid air cells are clear. The orbits are unremarkable. Other: Left parieto-occipital scalp soft tissue density with calcification likely a sebaceous cyst. IMPRESSION: No acute intracranial abnormality. Electronically Signed   By: Morgane  Naveau M.D.   On: 12/22/2023 19:44   XR Pelvis 1-2 Views Result Date: 12/15/2023 AP pelvis some degenerative changes of the hips by back laterally with some sclerotic changes in the superior acetabulum no acute fractures  XR Lumbar Spine 2-3 Views Result Date: 12/15/2023 Radiographs of the lumbar spine demonstrate overall well-maintained alignment some endplate sclerotic changes and some facet arthropathy no acute fractures degenerative changes noted mostly at L5-S1    Past medical hx Past Medical History:  Diagnosis Date   Cancer of sigmoid colon, Stage 1, pT1pN0, s/p robotic LAR resection 02/21/2021 02/21/2021   Hyperlipidemia      Social History   Tobacco Use   Smoking status: Every Day    Current packs/day: 0.50    Average packs/day: 0.5 packs/day for 25.0 years (12.5 ttl pk-yrs)    Types: Cigarettes    Passive exposure: Current   Smokeless tobacco: Never  Vaping Use   Vaping status: Never Used  Substance Use Topics   Alcohol use: Yes    Comment: occas.   Drug use: Never    Ms.Sproule reports that she has been smoking cigarettes. She has a 12.5 pack-year smoking history. She has been exposed to tobacco smoke. She has never used smokeless tobacco. She reports current alcohol use. She reports that she does not use drugs.  Tobacco Cessation: Ready to quit: Not Answered Counseling given: Not Answered Current every day smoker  Counseled to quit completely  Past surgical hx, Family hx, Social hx all reviewed.  Current Outpatient Medications on File Prior to Visit  Medication Sig   acetaminophen  (TYLENOL ) 500  MG tablet Take 500-1,000 mg by mouth every 6 (six) hours as needed (PAIN).   alendronate (FOSAMAX) 70 MG tablet Take 70 mg by mouth once  a week. 1 tablet 30 minutes before the first food, beverage or medicine of the day with plain water Orally once weekly for 84 days   Calcium  Citrate 250 MG TABS Take 1 tablet by mouth daily at 12 noon.   Cholecalciferol 50 MCG (2000 UT) CAPS Take 1 capsule by mouth daily at 12 noon.   cyanocobalamin  (VITAMIN B12) 1000 MCG tablet Take 1,000 mcg by mouth daily.   dexamethasone  (DECADRON ) 1 MG tablet Take 4 tablets (4 mg total) by mouth daily for 5 days, THEN 2 tablets (2 mg total) daily for 5 days, THEN 1 tablet (1 mg total) daily for 5 days. Follow up with oncology outpatient regarding ongoing steroids.   gabapentin  (NEURONTIN ) 300 MG capsule Take 1 capsule (300 mg total) by mouth 3 (three) times daily.   omeprazole 20 MG TBDD disintegrating tablet Take 20 mg by mouth daily.   rosuvastatin  (CRESTOR ) 5 MG tablet Take 5 mg by mouth in the morning. (0800)   No current facility-administered medications on file prior to visit.     No Known Allergies  Review Of Systems:  Constitutional:   + weight loss, No night sweats,  Fevers, chills, fatigue, or  lassitude.  HEENT:   No headaches,  Difficulty swallowing,  Tooth/dental problems, or  Sore throat,                No sneezing, itching, ear ache, nasal congestion, post nasal drip,   CV:  No chest pain,  Orthopnea, PND, swelling in lower extremities, anasarca, dizziness, palpitations, syncope.   GI  + heartburn, indigestion, abdominal pain, No nausea, vomiting, diarrhea, change in bowel habits, loss of appetite, bloody stools.   Resp: No shortness of breath with exertion or at rest.  No excess mucus, no productive cough,  No non-productive cough,  No coughing up of blood.  No change in color of mucus.  No wheezing.  No chest wall deformity  Skin: no rash or lesions.  GU: no dysuria, change in color of urine, no  urgency or frequency.  No flank pain, no hematuria   MS:  + joint pain or swelling.  No decreased range of motion.  + back pain.  Psych:  No change in mood or affect. No depression + anxiety with recent diagnosis.  No memory loss.   Vital Signs BP (!) 167/91 (BP Location: Left Arm, Patient Position: Sitting, Cuff Size: Normal)   Pulse 90   Ht 5\' 3"  (1.6 m)   Wt 103 lb (46.7 kg)   SpO2 96%   BMI 18.25 kg/m    Physical Exam:  General- No distress,  A&Ox3, pleasant ENT: No sinus tenderness, TM clear, pale nasal mucosa, no oral exudate,no post nasal drip, no LAN Cardiac: S1, S2, regular rate and rhythm, no murmur Chest: No wheeze/ rales/ dullness; no accessory muscle use, no nasal flaring, no sternal retractions, slightly diminished per bases Abd.: Soft Non-tender, ND, BS +, Body mass index is 18.25 kg/m.  Ext: No clubbing cyanosis, edema, no obvious deformities Neuro:  normal strength, MAE x 4, A&O x 3, appropriate Skin: No rashes, warm and dry, no obvious lesions  Psych: normal mood and behavior   Assessment/Plan Lung nodule Lung nodule identified on CT.  Differential includes primary lung cancer or metastasis.  Smoking history increases primary lung cancer risk.  Plan -We have discussed the procedure in detail.  We have reviewed the risks and benefits of the procedure. These include bleeding, infection, puncture of the lung, and  adverse reaction to anesthesia. You have agreed to proceed with biopsy to evaluate the right lymph node Your procedure will be done by Dr. Racheal Buddle. You will receive a letter today with date time and information pertaining to the procedure. You will need someone to drive you to the procedure, stay with you during the procedure, and stay with you after the procedure. You will also need someone to stay with you for 24 hours after anesthesia to ensure you have cleared and are doing well. You will follow-up with me 1 week after the procedure to  review the results and to ensure you are doing well. I have also ordered a PET scan to be done as soon as possible. This will allow us  to complete staging in the event this is a cancer. You will get a call to get this scheduled.  Call if you need us  prior to the procedure or if you have any questions at all. Please contact office for sooner follow up if symptoms do not improve or worsen or seek emergency care   Please work on quitting smoking. You can receive free nicotine replacement therapy ( patches, gum or mints) by calling 1-800-QUIT NOW. Please call so we can get you on the path to becoming  a non-smoker. I know it is hard, but you can do this! Other options for assistance in smoking cessation ( As covered by your insurance benefits)  Hypnosis for smoking cessation  [employer] [phone]  Acupuncture for smoking cessation  [employer] [phone]         Unintentional Weight loss 10-pound weight loss over two months concerning for malignancy as cause. Plan - Suspect related to malignant process. - Consider Boost or ensure for nutritional supplement  Mass on sacrum Mass on sacrum identified. Etiology unknown, could be primary tumor or metastasis from colon. - PET to evaluate for staging - Referral to appropriate specialty for treatment based on tissue diagnosis results  Colon cancer Stage 1 colon cancer resected in June 2022. No lymph node involvement, no adjuvant chemotherapy. Current findings could represent metastasis. Plan Biopsy of lung to determine if metastatic colon cancer vs new Primary lung cancer Appropriate referral will be made once biopsy results confirm diagnosis  Hydronephrosis Hydronephrosis noted, possibly due to ureteral obstruction. - Scheduled nephrology evaluation on Jan 13, 2024.   I spent 45 minutes dedicated to the care of this patient on the date of this encounter to include pre-visit review of records, face-to-face  time with the patient discussing conditions above, post visit ordering of testing, clinical documentation with the electronic health record, making appropriate referrals as documented, and communicating necessary information to the patient's healthcare team.    Raejean Bullock, NP 12/31/2023  8:55 AM

## 2024-01-03 ENCOUNTER — Encounter
Admission: RE | Admit: 2024-01-03 | Discharge: 2024-01-03 | Disposition: A | Discharge status: Home / Self Care | Source: Ambulatory Visit | Attending: Acute Care | Admitting: Acute Care

## 2024-01-03 DIAGNOSIS — R911 Solitary pulmonary nodule: Secondary | ICD-10-CM | POA: Insufficient documentation

## 2024-01-03 DIAGNOSIS — R918 Other nonspecific abnormal finding of lung field: Secondary | ICD-10-CM | POA: Diagnosis not present

## 2024-01-03 DIAGNOSIS — C20 Malignant neoplasm of rectum: Secondary | ICD-10-CM | POA: Diagnosis not present

## 2024-01-03 LAB — GLUCOSE, CAPILLARY: Glucose-Capillary: 92 mg/dL (ref 70–99)

## 2024-01-03 MED ORDER — FLUDEOXYGLUCOSE F - 18 (FDG) INJECTION
5.6300 | Freq: Once | INTRAVENOUS | Status: AC | PRN
  Administered 2024-01-03: 5.63 via INTRAVENOUS

## 2024-01-03 NOTE — Anesthesia Preprocedure Evaluation (Signed)
 Anesthesia Evaluation  Patient identified by MRN, date of birth, ID band Patient awake    Reviewed: Allergy & Precautions, NPO status , Patient's Chart, lab work & pertinent test results  History of Anesthesia Complications Negative for: history of anesthetic complications  Airway Mallampati: II  TM Distance: >3 FB Neck ROM: Full    Dental  (+) Dental Advisory Given   Pulmonary Current Smoker and Patient abstained from smoking.   Pulmonary exam normal        Cardiovascular negative cardio ROS Normal cardiovascular exam   '25 TTE - EF 65 to 70%. Trivial MR and AI    Neuro/Psych negative neurological ROS  negative psych ROS   GI/Hepatic Neg liver ROS,GERD  Medicated and Controlled,,Colon cancer    Endo/Other  negative endocrine ROS    Renal/GU Renal disease     Musculoskeletal negative musculoskeletal ROS (+)    Abdominal   Peds  Hematology negative hematology ROS (+)   Anesthesia Other Findings   Reproductive/Obstetrics                             Anesthesia Physical Anesthesia Plan  ASA: 3  Anesthesia Plan: General   Post-op Pain Management: Minimal or no pain anticipated   Induction: Intravenous  PONV Risk Score and Plan: 2 and Treatment may vary due to age or medical condition, Ondansetron  and Dexamethasone   Airway Management Planned: Oral ETT  Additional Equipment: None  Intra-op Plan:   Post-operative Plan: Extubation in OR  Informed Consent: I have reviewed the patients History and Physical, chart, labs and discussed the procedure including the risks, benefits and alternatives for the proposed anesthesia with the patient or authorized representative who has indicated his/her understanding and acceptance.     Dental advisory given  Plan Discussed with: CRNA and Anesthesiologist  Anesthesia Plan Comments:        Anesthesia Quick Evaluation

## 2024-01-04 ENCOUNTER — Ambulatory Visit (HOSPITAL_COMMUNITY)

## 2024-01-04 ENCOUNTER — Encounter (HOSPITAL_COMMUNITY): Payer: Self-pay | Admitting: Emergency Medicine

## 2024-01-04 ENCOUNTER — Encounter (HOSPITAL_COMMUNITY)
Admission: RE | Disposition: A | Discharge status: Home / Self Care | Payer: Self-pay | Source: Home / Self Care | Attending: Emergency Medicine

## 2024-01-04 ENCOUNTER — Ambulatory Visit: Admitting: Physical Therapy

## 2024-01-04 ENCOUNTER — Other Ambulatory Visit: Payer: Self-pay

## 2024-01-04 ENCOUNTER — Ambulatory Visit (HOSPITAL_BASED_OUTPATIENT_CLINIC_OR_DEPARTMENT_OTHER): Payer: Self-pay | Admitting: Anesthesiology

## 2024-01-04 ENCOUNTER — Ambulatory Visit (HOSPITAL_COMMUNITY): Payer: Self-pay | Admitting: Anesthesiology

## 2024-01-04 ENCOUNTER — Encounter: Payer: Self-pay | Admitting: *Deleted

## 2024-01-04 ENCOUNTER — Ambulatory Visit (HOSPITAL_COMMUNITY)
Admission: RE | Admit: 2024-01-04 | Discharge: 2024-01-04 | Disposition: A | Discharge status: Home / Self Care | Attending: Emergency Medicine | Admitting: Emergency Medicine

## 2024-01-04 DIAGNOSIS — E785 Hyperlipidemia, unspecified: Secondary | ICD-10-CM | POA: Diagnosis not present

## 2024-01-04 DIAGNOSIS — N131 Hydronephrosis with ureteral stricture, not elsewhere classified: Secondary | ICD-10-CM | POA: Diagnosis not present

## 2024-01-04 DIAGNOSIS — R918 Other nonspecific abnormal finding of lung field: Secondary | ICD-10-CM | POA: Diagnosis not present

## 2024-01-04 DIAGNOSIS — Z48813 Encounter for surgical aftercare following surgery on the respiratory system: Secondary | ICD-10-CM | POA: Diagnosis not present

## 2024-01-04 DIAGNOSIS — K219 Gastro-esophageal reflux disease without esophagitis: Secondary | ICD-10-CM | POA: Insufficient documentation

## 2024-01-04 DIAGNOSIS — C801 Malignant (primary) neoplasm, unspecified: Secondary | ICD-10-CM | POA: Diagnosis not present

## 2024-01-04 DIAGNOSIS — R911 Solitary pulmonary nodule: Secondary | ICD-10-CM | POA: Insufficient documentation

## 2024-01-04 DIAGNOSIS — F17200 Nicotine dependence, unspecified, uncomplicated: Secondary | ICD-10-CM | POA: Diagnosis not present

## 2024-01-04 DIAGNOSIS — R590 Localized enlarged lymph nodes: Secondary | ICD-10-CM

## 2024-01-04 DIAGNOSIS — R846 Abnormal cytological findings in specimens from respiratory organs and thorax: Secondary | ICD-10-CM | POA: Insufficient documentation

## 2024-01-04 DIAGNOSIS — Z681 Body mass index (BMI) 19 or less, adult: Secondary | ICD-10-CM | POA: Insufficient documentation

## 2024-01-04 DIAGNOSIS — Z85038 Personal history of other malignant neoplasm of large intestine: Secondary | ICD-10-CM | POA: Diagnosis not present

## 2024-01-04 DIAGNOSIS — R634 Abnormal weight loss: Secondary | ICD-10-CM | POA: Diagnosis not present

## 2024-01-04 DIAGNOSIS — C771 Secondary and unspecified malignant neoplasm of intrathoracic lymph nodes: Secondary | ICD-10-CM | POA: Diagnosis not present

## 2024-01-04 HISTORY — PX: ENDOBRONCHIAL ULTRASOUND: SHX5096

## 2024-01-04 HISTORY — PX: BRONCHIAL NEEDLE ASPIRATION BIOPSY: SHX5106

## 2024-01-04 HISTORY — DX: Gastro-esophageal reflux disease without esophagitis: K21.9

## 2024-01-04 SURGERY — ENDOBRONCHIAL ULTRASOUND (EBUS)
Anesthesia: General | Laterality: Right

## 2024-01-04 MED ORDER — SUGAMMADEX SODIUM 200 MG/2ML IV SOLN
INTRAVENOUS | Status: DC | PRN
  Administered 2024-01-04: 200 mg via INTRAVENOUS

## 2024-01-04 MED ORDER — PROPOFOL 10 MG/ML IV BOLUS
INTRAVENOUS | Status: DC | PRN
  Administered 2024-01-04: 140 ug/kg/min via INTRAVENOUS
  Administered 2024-01-04: 100 mg via INTRAVENOUS

## 2024-01-04 MED ORDER — FENTANYL CITRATE (PF) 100 MCG/2ML IJ SOLN: 25.0000 ug | INTRAMUSCULAR | Status: DC | PRN

## 2024-01-04 MED ORDER — MIDAZOLAM HCL 2 MG/2ML IJ SOLN
INTRAMUSCULAR | Status: DC | PRN
  Administered 2024-01-04: 2 mg via INTRAVENOUS

## 2024-01-04 MED ORDER — OXYCODONE HCL 5 MG PO TABS: 5.0000 mg | ORAL_TABLET | Freq: Once | ORAL | Status: DC | PRN

## 2024-01-04 MED ORDER — LIDOCAINE 2% (20 MG/ML) 5 ML SYRINGE
INTRAMUSCULAR | Status: DC | PRN
  Administered 2024-01-04: 50 mg via INTRAVENOUS

## 2024-01-04 MED ORDER — TRAMADOL HCL 50 MG PO TABS: 100.0000 mg | ORAL_TABLET | Freq: Four times a day (QID) | ORAL | 0 refills | Status: AC | PRN

## 2024-01-04 MED ORDER — FENTANYL CITRATE (PF) 250 MCG/5ML IJ SOLN
INTRAMUSCULAR | Status: DC | PRN
  Administered 2024-01-04: 25 ug via INTRAVENOUS
  Administered 2024-01-04: 50 ug via INTRAVENOUS
  Administered 2024-01-04: 25 ug via INTRAVENOUS

## 2024-01-04 MED ORDER — ONDANSETRON HCL 4 MG/2ML IJ SOLN: 4.0000 mg | Freq: Once | INTRAMUSCULAR | Status: DC | PRN

## 2024-01-04 MED ORDER — ONDANSETRON HCL 4 MG/2ML IJ SOLN
INTRAMUSCULAR | Status: DC | PRN
  Administered 2024-01-04: 4 mg via INTRAVENOUS

## 2024-01-04 MED ORDER — CHLORHEXIDINE GLUCONATE 0.12 % MT SOLN
15.0000 mL | Freq: Once | OROMUCOSAL | Status: AC
  Administered 2024-01-04: 15 mL via OROMUCOSAL
  Filled 2024-01-04: qty 15

## 2024-01-04 MED ORDER — FENTANYL CITRATE (PF) 100 MCG/2ML IJ SOLN
INTRAMUSCULAR | Status: AC
  Filled 2024-01-04: qty 2

## 2024-01-04 MED ORDER — ROCURONIUM BROMIDE 10 MG/ML (PF) SYRINGE
PREFILLED_SYRINGE | INTRAVENOUS | Status: DC | PRN
Start: 2024-01-04 — End: 2024-01-04
  Administered 2024-01-04: 40 mg via INTRAVENOUS
  Administered 2024-01-04: 10 mg via INTRAVENOUS

## 2024-01-04 MED ORDER — MELOXICAM 15 MG PO TABS: 15.0000 mg | ORAL_TABLET | Freq: Every day | ORAL | 0 refills | Status: DC

## 2024-01-04 MED ORDER — PHENYLEPHRINE HCL-NACL 20-0.9 MG/250ML-% IV SOLN
INTRAVENOUS | Status: AC
Start: 2024-01-04 — End: ?
  Filled 2024-01-04: qty 250

## 2024-01-04 MED ORDER — PROPOFOL 1000 MG/100ML IV EMUL
INTRAVENOUS | Status: AC
  Filled 2024-01-04: qty 500

## 2024-01-04 MED ORDER — DEXAMETHASONE SODIUM PHOSPHATE 10 MG/ML IJ SOLN
INTRAMUSCULAR | Status: DC | PRN
  Administered 2024-01-04: 10 mg via INTRAVENOUS

## 2024-01-04 MED ORDER — OXYCODONE HCL 5 MG/5ML PO SOLN: 5.0000 mg | Freq: Once | ORAL | Status: DC | PRN

## 2024-01-04 MED ORDER — MIDAZOLAM HCL 2 MG/2ML IJ SOLN
INTRAMUSCULAR | Status: AC
  Filled 2024-01-04: qty 2

## 2024-01-04 MED ORDER — LACTATED RINGERS IV SOLN: INTRAVENOUS | Status: DC

## 2024-01-04 MED ORDER — LABETALOL HCL 5 MG/ML IV SOLN
INTRAVENOUS | Status: DC | PRN
  Administered 2024-01-04: 5 mg via INTRAVENOUS

## 2024-01-04 NOTE — Interval H&P Note (Signed)
 History and Physical Interval Note:  01/04/2024 8:54 AM  Regina Young  has presented today for surgery, with the diagnosis of LUNG NODULE RIGHT.  The various methods of treatment have been discussed with the patient and family. After consideration of risks, benefits and other options for treatment, the patient has consented to  Procedure(s): BRONCHOSCOPY, WITH BIOPSY USING ELECTROMAGNETIC NAVIGATION (Right) ENDOBRONCHIAL ULTRASOUND (EBUS) (Right) as a surgical intervention.  The patient's history has been reviewed, patient examined, no change in status, stable for surgery.  I have reviewed the patient's chart and labs.  Questions were answered to the patient's satisfaction.     Denson Flake

## 2024-01-04 NOTE — Transfer of Care (Signed)
 Immediate Anesthesia Transfer of Care Note  Patient: Regina Young  Procedure(s) Performed: ENDOBRONCHIAL ULTRASOUND (EBUS) (Right) BRONCHOSCOPY, WITH NEEDLE ASPIRATION BIOPSY  Patient Location: PACU  Anesthesia Type:General  Level of Consciousness: awake and alert   Airway & Oxygen Therapy: Patient Spontanous Breathing and Patient connected to face mask oxygen  Post-op Assessment: Report given to RN and Post -op Vital signs reviewed and stable  Post vital signs: Reviewed and stable  Last Vitals:  Vitals Value Taken Time  BP 138/75 01/04/24 1031  Temp    Pulse 99 01/04/24 1034  Resp 16 01/04/24 1034  SpO2 94 % 01/04/24 1034  Vitals shown include unfiled device data.  Last Pain:  Vitals:   01/04/24 0745  TempSrc:   PainSc: 10-Worst pain ever         Complications: No notable events documented.

## 2024-01-04 NOTE — Discharge Instructions (Addendum)
 Flexible Bronchoscopy, Care After This sheet gives you information about how to care for yourself after your test. Your doctor may also give you more specific instructions. If you have problems or questions, contact your doctor. Follow these instructions at home: Eating and drinking When you are wide awake, your numbness is gone and your cough and gag reflexes have come back, you may: Start eating only soft foods. Slowly drink liquids. Six hours after the test, go back to your normal diet. Driving Do not drive for 24 hours if you were given a medicine to help you relax (sedative). Do not drive or use heavy machinery while taking prescription pain medicine. General instructions Take over-the-counter and prescription medicines only as told by your doctor. Return to your normal activities as told. Ask what activities are safe for you. Do not use any products that have nicotine or tobacco in them. This includes cigarettes and e-cigarettes. If you need help quitting, ask your doctor. Keep all follow-up visits as told by your doctor. This is important. It is very important if you had a tissue sample (biopsy) taken. Get help right away if: You have shortness of breath that gets worse. You get light-headed. You feel like you are going to pass out (faint). You have chest pain. You cough up: More than a little blood. More blood than before. Summary Do not use cigarettes. Do not use e-cigarettes. Seek care in the Emergency Department right away if you have chest pain or shortness of breath. Call or MyChart Message our office for any questions or problems at [phone].    This information is not intended to replace advice given to you by your health care provider. Make sure you discuss any questions you have with your health care provider.

## 2024-01-04 NOTE — Anesthesia Postprocedure Evaluation (Signed)
 Anesthesia Post Note  Patient: Regina Young  Procedure(s) Performed: ENDOBRONCHIAL ULTRASOUND (EBUS) (Right) BRONCHOSCOPY, WITH NEEDLE ASPIRATION BIOPSY     Patient location during evaluation: PACU Anesthesia Type: General Level of consciousness: awake and alert Pain management: pain level controlled Vital Signs Assessment: post-procedure vital signs reviewed and stable Respiratory status: spontaneous breathing, nonlabored ventilation and respiratory function stable Cardiovascular status: stable and blood pressure returned to baseline Anesthetic complications: no   No notable events documented.  Last Vitals:  Vitals:   01/04/24 1045 01/04/24 1100  BP: 128/69 128/73  Pulse: 97 95  Resp: 16 15  Temp:  37.2 C  SpO2: 91% 94%    Last Pain:  Vitals:   01/04/24 1100  TempSrc:   PainSc: 0-No pain                 Juventino Oppenheim

## 2024-01-04 NOTE — Op Note (Addendum)
 Video Bronchoscopy with Endobronchial Ultrasound Procedure Note  Date of Operation: 01/04/2024  Pre-op Diagnosis: Right hilar adenopathy versus right upper lobe nodule  Post-op Diagnosis: Right hilar adenopathy  Surgeon: Racheal Buddle  Assistants: None  Anesthesia: General endotracheal anesthesia  Operation: Flexible video fiberoptic bronchoscopy with endobronchial ultrasound and biopsies.  Estimated Blood Loss: 20 cc  Complications: None apparent  Indications and History: Regina Young is a 68 y.o. female with history of colon cancer diagnosed 2022, now with evidence of recurrent disease including a right hilar lymph node versus very proximal right upper lobe nodule.  Recommendation made to achieve a tissue diagnosis via endobronchial ultrasound with biopsies.  The risks, benefits, complications, treatment options and expected outcomes were discussed with the patient.  The possibilities of pneumothorax, pneumonia, reaction to medication, pulmonary aspiration, perforation of a viscus, bleeding, failure to diagnose a condition and creating a complication requiring transfusion or operation were discussed with the patient who freely signed the consent.    Description of Procedure: The patient was examined in the preoperative area and history and data from the preprocedure consultation were reviewed. It was deemed appropriate to proceed.  The patient was taken to [facility] room 3, identified as Regina Young and the procedure verified as Flexible Video Fiberoptic Bronchoscopy.  A Time Out was held and the above information confirmed. After being taken to the operating room general anesthesia was initiated and the patient  was orally intubated. The video fiberoptic bronchoscope was introduced via the endotracheal tube and a general inspection was performed which showed normal airways throughout. The standard scope was then withdrawn and the endobronchial ultrasound was used to identify  and characterize the peritracheal, hilar and bronchial lymph nodes. Inspection showed the enlarged irregular right hilar node at station 10R. Using real-time ultrasound guidance Wang needle biopsies were take from Station 10R node and were sent for cytology.  The procedure was technically difficult due to location, proximal blood vessels and segmental bronchi but several passes were taken and pulled for cellblock.  The patient tolerated the procedure well without apparent complications. There was no significant blood loss. The bronchoscope was withdrawn. Anesthesia was reversed and the patient was taken to the PACU for recovery.   Samples: 1. Wang needle biopsies from 10R node   Plans:  The patient will be discharged from the PACU to home when recovered from anesthesia. We will review the cytology, pathology and microbiology results with the patient when they become available. Outpatient followup will be with Braden Caddy, NP, Dr. Baldwin Levee, Dr. Maria Shiner.   She is having significant right buttock pain and sacral pain with radiculopathy, now evident that she has S1-2 disease on PET scan.  I will send short-term prescriptions for meloxicam and tramadol  until she can be seen by Dr. Candi Chafe and Dr. Maria Shiner to plan appropriate long-range pain control.   Judithe Keetch S. 01/04/2024

## 2024-01-04 NOTE — Progress Notes (Signed)
 Reviewed baseline PET scan. Results reflect known areas of concern.   Bronch completed today. Will follow for path.   Oncology Nurse Navigator Documentation     01/04/2024    9:15 AM  Oncology Nurse Navigator Flowsheets  Navigator Follow Up Date: 01/07/2024  Navigator Follow Up Reason: Pathology  Navigator Location [facility]  Navigator Encounter Type Scan Review;Appt/Treatment Plan Review  Patient Visit Type MedOnc  Treatment Phase Abnormal Scans  Barriers/Navigation Needs Coordination of Care  Interventions None Required  Acuity Level 2-Minimal Needs (1-2 Barriers Identified)  Support Groups/Services Friends and Family  Time Spent with Patient 15

## 2024-01-04 NOTE — Anesthesia Procedure Notes (Signed)
 Procedure Name: Intubation Date/Time: 01/04/2024 9:28 AM  Performed by: Deneice Finland, CRNAPre-anesthesia Checklist: Patient identified, Emergency Drugs available, Suction available and Patient being monitored Patient Re-evaluated:Patient Re-evaluated prior to induction Oxygen Delivery Method: Circle System Utilized Preoxygenation: Pre-oxygenation with 100% oxygen Induction Type: IV induction Ventilation: Mask ventilation without difficulty Laryngoscope Size: Mac and 3 Grade View: Grade II Tube type: Oral Tube size: 8.5 mm Number of attempts: 1 Airway Equipment and Method: Stylet and Oral airway Placement Confirmation: ETT inserted through vocal cords under direct vision, positive ETCO2 and breath sounds checked- equal and bilateral Secured at: 22 cm Tube secured with: Tape Dental Injury: Teeth and Oropharynx as per pre-operative assessment

## 2024-01-05 ENCOUNTER — Encounter (HOSPITAL_COMMUNITY): Payer: Self-pay | Admitting: Emergency Medicine

## 2024-01-07 ENCOUNTER — Encounter: Payer: Self-pay | Admitting: *Deleted

## 2024-01-07 DIAGNOSIS — N1330 Unspecified hydronephrosis: Secondary | ICD-10-CM | POA: Diagnosis not present

## 2024-01-07 DIAGNOSIS — R1900 Intra-abdominal and pelvic swelling, mass and lump, unspecified site: Secondary | ICD-10-CM | POA: Diagnosis not present

## 2024-01-07 DIAGNOSIS — N134 Hydroureter: Secondary | ICD-10-CM | POA: Diagnosis not present

## 2024-01-07 LAB — CYTOLOGY - NON PAP

## 2024-01-07 NOTE — Progress Notes (Signed)
 Per Dr Maria Shiner, request for [facility] One testing request sent on specimen [id number] DOS 01/04/2024.  Oncology Nurse Navigator Documentation     01/07/2024   10:45 AM  Oncology Nurse Navigator Flowsheets  Confirmed Diagnosis Date 01/04/2024  Diagnosis Status Pending Molecular Studies  Navigator Follow Up Date: 01/10/2024  Navigator Follow Up Reason: New Patient Appointment  Navigator Location [facility]  Navigator Encounter Type Molecular Studies;Pathology Review  Patient Visit Type MedOnc  Treatment Phase Pre-Tx/Tx Discussion  Barriers/Navigation Needs Coordination of Care  Interventions Coordination of Care  Acuity Level 2-Minimal Needs (1-2 Barriers Identified)  Coordination of Care Pathology  Support Groups/Services Friends and Family  Time Spent with Patient 30

## 2024-01-10 ENCOUNTER — Encounter: Payer: Self-pay | Admitting: Hematology & Oncology

## 2024-01-10 ENCOUNTER — Inpatient Hospital Stay (HOSPITAL_BASED_OUTPATIENT_CLINIC_OR_DEPARTMENT_OTHER): Admitting: Hematology & Oncology

## 2024-01-10 ENCOUNTER — Other Ambulatory Visit: Payer: Self-pay

## 2024-01-10 ENCOUNTER — Encounter: Payer: Self-pay | Admitting: *Deleted

## 2024-01-10 ENCOUNTER — Inpatient Hospital Stay

## 2024-01-10 ENCOUNTER — Inpatient Hospital Stay: Attending: Hematology & Oncology

## 2024-01-10 VITALS — BP 179/83 | HR 77 | Temp 98.4°F | Resp 16 | Ht 63.0 in | Wt 105.0 lb

## 2024-01-10 DIAGNOSIS — N1330 Unspecified hydronephrosis: Secondary | ICD-10-CM | POA: Diagnosis not present

## 2024-01-10 DIAGNOSIS — Z79899 Other long term (current) drug therapy: Secondary | ICD-10-CM | POA: Diagnosis not present

## 2024-01-10 DIAGNOSIS — Z87891 Personal history of nicotine dependence: Secondary | ICD-10-CM | POA: Diagnosis not present

## 2024-01-10 DIAGNOSIS — C187 Malignant neoplasm of sigmoid colon: Secondary | ICD-10-CM

## 2024-01-10 DIAGNOSIS — C3491 Malignant neoplasm of unspecified part of right bronchus or lung: Secondary | ICD-10-CM | POA: Insufficient documentation

## 2024-01-10 DIAGNOSIS — M549 Dorsalgia, unspecified: Secondary | ICD-10-CM | POA: Insufficient documentation

## 2024-01-10 DIAGNOSIS — C3490 Malignant neoplasm of unspecified part of unspecified bronchus or lung: Secondary | ICD-10-CM

## 2024-01-10 DIAGNOSIS — M79604 Pain in right leg: Secondary | ICD-10-CM | POA: Insufficient documentation

## 2024-01-10 DIAGNOSIS — Z85038 Personal history of other malignant neoplasm of large intestine: Secondary | ICD-10-CM | POA: Diagnosis not present

## 2024-01-10 DIAGNOSIS — C786 Secondary malignant neoplasm of retroperitoneum and peritoneum: Secondary | ICD-10-CM | POA: Diagnosis not present

## 2024-01-10 DIAGNOSIS — M899 Disorder of bone, unspecified: Secondary | ICD-10-CM | POA: Diagnosis not present

## 2024-01-10 HISTORY — DX: Secondary malignant neoplasm of retroperitoneum and peritoneum: C78.6

## 2024-01-10 HISTORY — DX: Malignant neoplasm of unspecified part of right bronchus or lung: C34.91

## 2024-01-10 LAB — CMP (CANCER CENTER ONLY)
ALT: 12 U/L (ref 0–44)
AST: 13 U/L — ABNORMAL LOW (ref 15–41)
Albumin: 4.5 g/dL (ref 3.5–5.0)
Alkaline Phosphatase: 73 U/L (ref 38–126)
Anion gap: 7 (ref 5–15)
BUN: 23 mg/dL (ref 8–23)
CO2: 30 mmol/L (ref 22–32)
Calcium: 9.9 mg/dL (ref 8.9–10.3)
Chloride: 103 mmol/L (ref 98–111)
Creatinine: 0.95 mg/dL (ref 0.44–1.00)
GFR, Estimated: >60 mL/min (ref >60)
Glucose, Bld: 99 mg/dL (ref 70–99)
Potassium: 4.7 mmol/L (ref 3.5–5.1)
Sodium: 140 mmol/L (ref 135–145)
Total Bilirubin: 0.4 mg/dL (ref 0.0–1.2)
Total Protein: 6.9 g/dL (ref 6.5–8.1)

## 2024-01-10 LAB — CBC WITH DIFFERENTIAL (CANCER CENTER ONLY)
Abs Immature Granulocytes: 0.12 10*3/uL — ABNORMAL HIGH (ref 0.00–0.07)
Basophils Absolute: 0 10*3/uL (ref 0.0–0.1)
Basophils Relative: 0 %
Eosinophils Absolute: 0.1 10*3/uL (ref 0.0–0.5)
Eosinophils Relative: 1 %
HCT: 37.5 % (ref 36.0–46.0)
Hemoglobin: 12.2 g/dL (ref 12.0–15.0)
Immature Granulocytes: 1 %
Lymphocytes Relative: 10 %
Lymphs Abs: 1.2 10*3/uL (ref 0.7–4.0)
MCH: 30.1 pg (ref 26.0–34.0)
MCHC: 32.5 g/dL (ref 30.0–36.0)
MCV: 92.6 fL (ref 80.0–100.0)
Monocytes Absolute: 0.5 10*3/uL (ref 0.1–1.0)
Monocytes Relative: 4 %
Neutro Abs: 10.9 10*3/uL — ABNORMAL HIGH (ref 1.7–7.7)
Neutrophils Relative %: 84 %
Platelet Count: 333 10*3/uL (ref 150–400)
RBC: 4.05 MIL/uL (ref 3.87–5.11)
RDW: 13.7 % (ref 11.5–15.5)
WBC Count: 12.8 10*3/uL — ABNORMAL HIGH (ref 4.0–10.5)
nRBC: 0 % (ref 0.0–0.2)

## 2024-01-10 LAB — PREALBUMIN: Prealbumin: 28 mg/dL (ref 18–38)

## 2024-01-10 LAB — LACTATE DEHYDROGENASE: LDH: 188 U/L (ref 98–192)

## 2024-01-10 NOTE — Progress Notes (Signed)
 [facility] Clinical Social Work  Initial Assessment   Regina Young is a 68 y.o. year old female contacted by phone. Clinical Social Work was referred by medical provider for assessment of psychosocial needs.   SDOH (Social Determinants of Health) assessments performed: Yes SDOH Interventions    Flowsheet Row Clinical Support from 01/10/2024 in [facility] Of [place] ED to Hosp-Admission (Discharged) from 12/22/2023 in [facility] [place] [facility]  SDOH Interventions    Food Insecurity Interventions -- Intervention Not Indicated, Inpatient TOC  Housing Interventions -- Intervention Not Indicated, Inpatient TOC  Transportation Interventions -- Intervention Not Indicated, Inpatient TOC  Utilities Interventions -- Intervention Not Indicated, Inpatient TOC  Financial Strain Interventions Intervention Not Indicated --       SDOH Screenings   Food Insecurity: No Food Insecurity (12/24/2023)  Housing: Low Risk  (12/24/2023)  Transportation Needs: No Transportation Needs (12/24/2023)  Utilities: Not At Risk (12/24/2023)  Financial Resource Strain: Low Risk  (01/10/2024)  Social Connections: Moderately Isolated (12/23/2023)  Tobacco Use: High Risk (01/04/2024)     Distress Screen completed: No     No data to display            Family/Social Information:  Housing Arrangement: patient lives with her husband. Family members/support persons in your life? Family Transportation concerns: no  Financial concerns: No Type of concern: None Food access concerns: no Religious or spiritual practice: Yes-[employer] Advanced directives: No Services Currently in place:  Medicare  Coping/ Adjustment to diagnosis: Patient understands treatment plan and what happens next? yes Concerns about diagnosis and/or treatment: I'm not especially worried about anything Patient reported stressors:  None currently. Hopes and/or priorities: Family Patient enjoys time with  family/ friends Current coping skills/ strengths: Average or above average intelligence , Capable of independent living , Manufacturing systems engineer , Contractor , General fund of knowledge , and Supportive family/friends     SUMMARY: Current SDOH Barriers:  None  Clinical Social Work Clinical Goal(s):  No clinical social work goals at this time  Interventions: Discussed common feeling and emotions when being diagnosed with cancer, and the importance of support during treatment Informed patient of the support team roles and support services at [facility] Provided CSW contact information and encouraged patient to call with any questions or concerns Provided patient with information about CSW role, including counseling and advance directives.   Follow Up Plan: Patient will contact CSW with any support or resource needs Patient verbalizes understanding of plan: Yes    Kennth Peal, LCSW Clinical Social Worker [facility]

## 2024-01-10 NOTE — Progress Notes (Signed)
 Initial RN Navigator Patient Visit  Name: Regina Young Diagnosis: Stage IV Lung  Met with patient prior to their visit with MD. Regina Young patient "Your Patient Navigator" handout which explains my role, areas in which I am able to help, and all the contact information for myself and the office. Also gave patient MD and Navigator business card. Reviewed with patient the general overview of expected course after initial diagnosis and time frame for all steps to be completed.  New patient packet given to patient which includes: orientation to office and staff; campus directory; education on My Chart and Advance Directives; and patient centered education on lung cancer.   She comes in with her husband. She lives with him and also has a sister who is a support. They have no children. 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 She would like to continue working, but her husband seems to prefer she stay at home. He is retired.   Referral to social work and nutrition placed per protocol. Molecular testing pending.   Patient completed visit with Dr. Maria Shiner. Dr Maria Shiner would like patient seen by Rad Onc for Rxt.  Referral placed. She will also need an MRI of the brain.   Will follow up tomorrow once office note dictated and orders placed.   Patient understands all follow up procedures and expectations. They have my number to reach out for any further clarification or additional needs.   Oncology Nurse Navigator Documentation     01/10/2024   11:00 AM  Oncology Nurse Navigator Flowsheets  Navigator Follow Up Date: 01/11/2024  Navigator Follow Up Reason: Appointment Review  Navigator Location [facility]  Navigator Encounter Type Initial MedOnc  Patient Visit Type MedOnc  Treatment Phase Pre-Tx/Tx Discussion  Barriers/Navigation Needs Coordination of Care  Interventions Education;Psycho-Social Support;Referrals  Acuity Level 2-Minimal Needs (1-2 Barriers Identified)  Referrals  Nutrition/dietician;Radiation Oncology;Social Work  Nature conservation officer Groups/Services Friends and Family  Time Spent with Patient 45  Genetic Counseling Type None

## 2024-01-10 NOTE — Progress Notes (Signed)
 Hematology and Oncology Follow Up Visit  Regina Young [id number] 11-05-1955 68 y.o. 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  We saw her in consultation at [facility] at hospital.  She was in because of back pain and would like to be a metastatic lesion to the sacral area.  She has had her workup..  She has she underwent bronchoscopy.  This was done on 01/04/2024.  The pathology report ([id number]) which showed adenocarcinoma consistent with a lung primary.  We will send this off for molecular analysis hopefully there will be enough tissue.  She had a PET scan that was done.  The PET scan was done on 01/03/2024.  This did show that there was some activity in a right suprahilar nodule.  This measured 18 x 16 mm.  Also noted was uptake in tissue anterior to the sacrum.  There was some involvement of the presacral space..  She does have pain in the right leg.  There also appears to be moderate to severe right renal collecting system dilation.  We actually may need to get urology into see if she needs to have a stent placed.  Her renal function seems to be doing okay right now.  She says that the pain seems to be under decent control.  She does have some refills of her medications.  She is eating okay.  She is having no nausea or vomiting.  She is having no bleeding.  There is no problems with bowels or bladder.  She has had no headache..  She also need to have a MRI of the brain.   I did speak with Dr. Eloise Hake of Radiation Oncology.  I think she clearly will need radiotherapy for this retroperitoneal/sacral lesion.  Will then have to deal with the lung nodule.  I think another option also might be SBRT for the lung.  Again I think having the molecular analysis back will be  critical.  Overall, I would say that her performance status is probably ECOG 1.    Medications:  Current Outpatient Medications:    acetaminophen  (TYLENOL ) 500 MG tablet, Take 500-1,000 mg by mouth every 6 (six) hours as needed (PAIN)., Disp: , Rfl:    alendronate (FOSAMAX) 70 MG tablet, Take 70 mg by mouth once a week. 1 tablet 30 minutes before the first food, beverage or medicine of the day with plain water Orally once weekly for 84 days, Disp: , Rfl:    Calcium  Citrate 250 MG TABS, Take 1 tablet by mouth in the morning., Disp: , Rfl:    Cholecalciferol 50 MCG (2000 UT) CAPS, Take 1 capsule by mouth daily., Disp: , Rfl:    cyanocobalamin  (VITAMIN B12) 1000 MCG tablet, Take 1,000 mcg by mouth daily., Disp: , Rfl:    dexamethasone  (DECADRON ) 1 MG tablet, Take 4 tablets (4 mg total) by mouth daily for 5 days, THEN 2 tablets (2 mg total) daily for 5 days, THEN 1 tablet (1 mg total) daily for 5 days. Follow up with oncology outpatient regarding ongoing steroids., Disp: 35 tablet, Rfl: 0   gabapentin  (NEURONTIN ) 300 MG capsule, Take 1 capsule (300 mg total) by mouth 3 (three) times daily., Disp: 90 capsule, Rfl: 1   meloxicam  (MOBIC ) 15 MG tablet, Take 1 tablet (15 mg total) by  mouth daily for 10 days., Disp: 10 tablet, Rfl: 0   omeprazole 20 MG TBDD disintegrating tablet, Take 20 mg by mouth daily., Disp: , Rfl:    rosuvastatin  (CRESTOR ) 5 MG tablet, Take 5 mg by mouth in the morning. (0800), Disp: , Rfl:   Allergies: No Known Allergies  Past Medical History, Surgical history, Social history, and Family History were reviewed and updated.  Review of Systems: Review of Systems  Constitutional: Negative.   HENT:  Negative.    Eyes: Negative.   Respiratory: Negative.    Cardiovascular: Negative.   Gastrointestinal:  Positive for abdominal pain.  Endocrine: Negative.   Genitourinary: Negative.    Musculoskeletal:  Positive for back pain.  Neurological: Negative.   Hematological: Negative.    Psychiatric/Behavioral: Negative.      Physical Exam:  height is 5\' 3"  (1.6 m) and weight is 105 lb (47.6 kg). Her oral temperature is 98.4 F (36.9 C). Her blood pressure is 179/83 (abnormal) and her pulse is 77. Her respiration is 16 and oxygen saturation is 96%.   Wt Readings from Last 3 Encounters:  01/10/24 105 lb (47.6 kg)  01/04/24 104 lb (47.2 kg)  12/31/23 103 lb (46.7 kg)    Physical Exam Vitals reviewed.  HENT:     Head: Normocephalic and atraumatic.  Eyes:     Pupils: Pupils are equal, round, and reactive to light.  Cardiovascular:     Rate and Rhythm: Normal rate and regular rhythm.     Heart sounds: Normal heart sounds.  Pulmonary:     Effort: Pulmonary effort is normal.     Breath sounds: Normal breath sounds.  Abdominal:     General: Bowel sounds are normal.     Palpations: Abdomen is soft.  Musculoskeletal:        General: No tenderness or deformity. Normal range of motion.     Cervical back: Normal range of motion.  Lymphadenopathy:     Cervical: No cervical adenopathy.  Skin:    General: Skin is warm and dry.     Findings: No erythema or rash.  Neurological:     Mental Status: She is alert and oriented to person, place, and time.  Psychiatric:        Behavior: Behavior normal.        Thought Content: Thought content normal.        Judgment: Judgment normal.      Lab Results  Component Value Date   WBC 12.8 (H) 01/10/2024   HGB 12.2 01/10/2024   HCT 37.5 01/10/2024   MCV 92.6 01/10/2024   PLT 333 01/10/2024     Chemistry      Component Value Date/Time   NA 140 01/10/2024 1125   K 4.7 01/10/2024 1125   CL 103 01/10/2024 1125   CO2 30 01/10/2024 1125   BUN 23 01/10/2024 1125   CREATININE 0.95 01/10/2024 1125      Component Value Date/Time   CALCIUM  9.9 01/10/2024 1125   ALKPHOS 73 01/10/2024 1125   AST 13 (L) 01/10/2024 1125   ALT 12 01/10/2024 1125   BILITOT 0.4 01/10/2024 1125      Impression and Plan: Regina Young is a very  charming 68 year old white female.  She has a past history of an early stage colon cancer.  However, now it looks like she has a metastatic non-small cell lung cancer-adenocarcinoma.  I have read to mention that she was a smoker.  She stopped a week ago.  She  is smoked probably for about 50 years.  Again, the molecular markers will be critical in this situation.  Again we have to start off with radiotherapy to the retroperitoneal region and the sacral area.  We will then have to see how we can manage the primary which is in the right lung.  Again it does not seem like the lesion in the right lung is all that big.  I wonder if we could consider SBRT for this.  We will see what the MRI of the brain shows.  I will have to see if we need to get any involvement by urology because of the hydronephrosis.  I do not know if the right ureter is going need to have any protection from radiotherapy.  I would like to see her back probably within a couple weeks or so.   Ivor Mars, MD 5/5/20252:25 PM

## 2024-01-11 ENCOUNTER — Encounter: Payer: Self-pay | Admitting: *Deleted

## 2024-01-11 ENCOUNTER — Ambulatory Visit: Admitting: Acute Care

## 2024-01-11 ENCOUNTER — Encounter: Payer: Self-pay | Admitting: Acute Care

## 2024-01-11 ENCOUNTER — Other Ambulatory Visit: Payer: Self-pay | Admitting: Emergency Medicine

## 2024-01-11 VITALS — BP 175/92 | HR 63 | Ht 63.0 in | Wt 104.2 lb

## 2024-01-11 DIAGNOSIS — Z87891 Personal history of nicotine dependence: Secondary | ICD-10-CM

## 2024-01-11 DIAGNOSIS — C3491 Malignant neoplasm of unspecified part of right bronchus or lung: Secondary | ICD-10-CM

## 2024-01-11 DIAGNOSIS — Z85038 Personal history of other malignant neoplasm of large intestine: Secondary | ICD-10-CM

## 2024-01-11 NOTE — Patient Instructions (Addendum)
 It is good to see you today. I am glad you have done well since your Bronchoscopy with biopsy. You biopsy was positive for adenocarcinoma of the lung. You have seen Dr. Michaeleen Adler am checking with him regarding whether you need an additional referral to medical oncology. I suspect he will manage the lung nodule also. If he does not, I will place a referral to medical oncology. You will great great care at the [facility]. Congratulations on quitting smoking. That is so amazing. Keep up the great work Follow up with us  as needed. Call if you need us .  Delynn Fill with your treatment. Please contact office for sooner follow up if symptoms do not improve or worsen or seek emergency care

## 2024-01-11 NOTE — Progress Notes (Signed)
 History of Present Illness Regina Young is a 68 y.o. female current every day smoker ( Quit 1 week ago) with a with history of stage 1 colon cancer ( resection ) referred 12/2023 for evaluation of a  1.8 x 1.6 cm soft tissue nodule in the right suprahilar region compatible with abnormal hilar lymph node or central right upper lobe pulmonary nodule.She will be followed by Dr. Baldwin Levee.   - History includes stage I colon cancer in 2022 - History of duodenal polyp in August 2014   Pt. Has consented to use of [employer] soft wear to help capture the content of this OV    01/11/2024 Patient presents for follow-up after bronchoscopy with biopsies.  She states she has done well after the procedure.  Minimal bleeding that self resolved, patient denies fever, discolored secretions, worsening breathing at baseline, or adverse reaction to anesthesia.  We have reviewed the patient's biopsy results.  The 10R FNA was positive for adenocarcinoma consistent with primary lung cancer.  Patient is currently being followed by Dr. Maria Shiner medical oncology.  He saw the patient 01/10/2024, and is planning on managing both her lung cancer and the retroperitoneal and sacral areas of concern. Per his note he is planning on collecting molecular markers. Current plan is for radiation therapy.  She has consultation with Dr. Eloise Hake scheduled for May 12.  MRI brain with and without contrast has been ordered to complete staging.  Dr. Maria Shiner plans to see her back in approximately 2 weeks to reevaluate and determine next best steps.  Patient did not have any further questions or concerns at completion of the office visit.  She is here with her husband today who is a wonderful support person.  They asked [facility] appropriate questions primary concern is getting [name] the best care as quickly as possible.  Test Results: Cytology 01/04/2024 A. LYMPH NODE, 10R, FINE NEEDLE ASPIRATION:  - Malignant  - Adenocarcinoma consistent with lung  primary   RADIOLOGY CT Chest 12/23/2023 1.8 x 1.6 cm soft tissue nodule in the right suprahilar region compatible with abnormal hilar lymph node or central right upper lobe pulmonary nodule. Imaging features are suspicious for metastatic disease although synchronous primary bronchogenic neoplasm is also a consideration. PET-CT may prove helpful to further evaluate as clinically warranted. No other evidence for metastatic disease in the chest. Right-sided hydronephrosis seen on today's study was better characterized on yesterday's dedicated abdomen and pelvis CT. Emphysema. ([id number])   CT Spine 12/24/2023 Extensive tumor infiltration of the sacrum at the S1 level, asymmetric to the right, and Severely involving the Right S1 nerve, right S1 neural foramen. No other tumor or metastatic disease identified in the Lumbar Spine. Ongoing Right Hydronephrosis and hydroureter.   CT L Spine 12/22/2023 IMPRESSION: 1. 1.6 x 3.3 cm infiltrative soft tissue mass along the right anterolateral aspect of S1 2. Perineural extension into the right S1 neural foramen without involvement of the thecal sac itself. 3. 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 4. Associated malignant erosion of the right anterosuperior aspect of the S1 vertebral body with mild loss of height anteriorly. 5. The mass is indistinguishable from the adjacent L5-S1 intervertebral disc space though disc space height is preserved. 6. Encasement of the proximal right common iliac artery. 7. These findings would be better assessed with contrast enhanced MRI examination     CT Abdomen and Pelvis 12/22/2023 Recurrent retroperitoneal soft tissue mass within the retroperitoneum abutting the right anterolateral margin of the  S1 vertebral body resulting in malignant destruction of the anterosuperior aspect of the S1 vertebral body and perineural extension into the right S1 neural foramen. Encasement of  the proximal right internal iliac artery. 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 Mild hepatic steatosis. Surgical changes of sigmoid colectomy. Aortic Atherosclerosis (ICD10-I70.0).   CT Head 12/22/2023 No acute intracranial abnormality.    Latest Ref Rng & Units 01/10/2024   11:25 AM 12/26/2023    4:46 AM 12/25/2023    5:25 AM  CBC  WBC 4.0 - 10.5 K/uL 12.8  6.8  6.4   Hemoglobin 12.0 - 15.0 g/dL 53.6  64.4  03.4   Hematocrit 36.0 - 46.0 % 37.5  37.4  39.4   Platelets 150 - 400 K/uL 333  252  333        Latest Ref Rng & Units 01/10/2024   11:25 AM 12/26/2023    4:46 AM 12/25/2023    5:25 AM  BMP  Glucose 70 - 99 mg/dL 99  742  86   BUN 8 - 23 mg/dL 23  20  26    Creatinine 0.44 - 1.00 mg/dL 5.95  6.38  7.56   Sodium 135 - 145 mmol/L 140  139  138   Potassium 3.5 - 5.1 mmol/L 4.7  3.9  3.8   Chloride 98 - 111 mmol/L 103  104  103   CO2 22 - 32 mmol/L 30  25  24    Calcium  8.9 - 10.3 mg/dL 9.9  9.1  9.0     BNP No results found for: "BNP"  ProBNP No results found for: "PROBNP"  PFT No results found for: "FEV1PRE", "FEV1POST", "FVCPRE", "FVCPOST", "TLC", "DLCOUNC", "PREFEV1FVCRT", "PSTFEV1FVCRT"  DG Chest Port 1 View Result Date: 01/04/2024 CLINICAL DATA:  Status post bronchoscopy. EXAM: PORTABLE CHEST 1 VIEW COMPARISON:  PET-CT dated 01/03/2024.  CT chest dated 12/23/2023. FINDINGS: The heart size and mediastinal contours are within normal limits. No focal consolidation, pleural effusion, or pneumothorax. Hypermetabolic right suprahilar nodule is not well visualized on this exam. No acute osseous abnormality. IMPRESSION: 1. No acute cardiopulmonary findings. 2. Hypermetabolic right suprahilar nodule is not well visualized on this exam and better evaluated on the recent PET-CT dated 01/03/2024. Electronically Signed   By: Mannie Seek M.D.   On: 01/04/2024 11:45   NM PET Image Initial (PI) Skull Base To  Thigh Result Date: 01/03/2024 CLINICAL DATA:  Follow up treatment strategy for rectal cancer EXAM: NUCLEAR MEDICINE PET SKULL BASE TO THIGH TECHNIQUE: 5.63 mCi F-18 FDG was injected intravenously. Full-ring PET imaging was performed from the skull base to thigh after the radiotracer. CT data was obtained and used for attenuation correction and anatomic localization. Fasting blood glucose: 92 mg/dl COMPARISON:  Chest CT 43/32/9518. abdomen pelvis CT 12/22/2023. Older exams as well. FINDINGS: Mediastinal blood pool activity: SUV max 2.4 Liver activity: SUV max 2.4 NECK: Extensive brown fat uptake aligned fide along the posterior aspect of the neck sing in the supraclavicular region is. Some paraspinal areas as well and areas in the thoracic inlet. Otherwise no specific abnormal radiotracer uptake seen along lymph node change the neck including submandibular, posterior triangle or internal jugular region. Near symmetric uptake of the visualized intracranial compartment. Incidental CT findings: Paranasal sinuses and mastoid air cells are clear. Parotid glands, submandibular glands and thyroid gland are unremarkable. CHEST: Once again there is extensive brown fat uptake identified in the chest including paraspinal, axillary, posterior mediastinal and  pre cardiophrenic. Areas along chest wall as well. On prior chest CT there was a right suprahilar nodule. This could be either lung parenchymal or hilar node based on location. This has maximum SUV value of 10.0 and worrisome for neoplasm. Previously this measured 18 x 16 mm. No other areas of pathologic abnormal uptake along the hilum, mediastinum, axillary region or along the lung parenchyma. Incidental CT findings: There is some linear opacity lung bases likely scar or atelectasis. No pleural effusion, pneumothorax or effusion. Apical pleural thickening. Heart is nonenlarged. No significant pericardial effusion. ABDOMEN/PELVIS: There is physiologic distribution  radiotracer along the parenchymal organs and bowel. Area of abnormal uptake identified with area of sclerosis and lucency along the upper sacrum. There is extension of abnormal uptake in tissue extending anterior to the sacrum this location. This has maximum SUV value of 10.9. There is associated dilatation of the right renal collecting system with some high density material along the lumen of the dilated system. The dilated ureter extends down to the pelvis near this location of the abnormal soft tissue and may be involved by the abnormal tissue. Please correlate with patient's renal function. This abnormal tissue extends in a line along the sacrum but also along the neural foramen at S1-2 and may extend into the central canal of the sacrum. There is some surgical clips in this location. Incidental CT findings: Diffuse colonic stool. There is some contrast along the colon. No bowel obstruction. Mild vascular calcifications. Gallbladder is nondilated. There is some small cystic areas along the liver, unchanged from previous and again no abnormal uptake. Small splenic cystic foci also noted. Prior sigmoid colon resection. SKELETON: Again abnormal uptake involving the upper sacrum. No other areas of abnormal uptake along the visualized osseous structures. Curvature and degenerative changes along the spine. Degenerative changes along pelvis. IMPRESSION: Large heterogeneous area of abnormal uptake involving the upper sacrum with abnormal soft tissue extending into the presacral space as well as along the right neural foramen at S1-2. As described previously this appears to be involving the course of the right ureter with moderate to severe right renal collecting system dilatation. Please correlate with patient's renal function and signs of true ureteral obstruction. This could be invasive area of neoplasm. Hypermetabolic appearance to the nodule described right suprahilar. This could be an abnormal node versus a true  lung lesion. No other specific areas of abnormal uptake to suggest additional metastatic disease. Of note there is diffuse areas of brown fat uptake particularly along the neck and chest. Electronically Signed   By: Adrianna Horde M.D.   On: 01/03/2024 12:06   VAS US  CAROTID Result Date: 12/25/2023 Carotid Arterial Duplex Study Patient Name:  IRAN BLACKNER  Date of Exam:   12/24/2023 Medical Rec #: [id number]      Accession #:    [id number] Date of Birth: Jun 19, 1956      Patient Gender: F Patient Age:   47 years Exam Location:  [facility] Procedure:      VAS US  CAROTID Referring Phys: DAVID ORTIZ --------------------------------------------------------------------------------  Indications:       Syncope. Risk Factors:      Hyperlipidemia, current smoker. Limitations        Today's exam was limited due to a VERY high bifurcation of                    the carotid bilaterally. Comparison Study:  No previous exams Performing Technologist: Jody Hill RVT, RDMS  Examination Guidelines: A  complete evaluation includes B-mode imaging, spectral Doppler, color Doppler, and power Doppler as needed of all accessible portions of each vessel. Bilateral testing is considered an integral part of a complete examination. Limited examinations for reoccurring indications may be performed as noted.  Right Carotid Findings: +----------+--------+--------+--------+------------------+------------------+           PSV cm/sEDV cm/sStenosisPlaque DescriptionComments           +----------+--------+--------+--------+------------------+------------------+ CCA Prox  73      16                                intimal thickening +----------+--------+--------+--------+------------------+------------------+ CCA Distal91      27                                intimal thickening +----------+--------+--------+--------+------------------+------------------+ ICA Prox  59      23                                                    +----------+--------+--------+--------+------------------+------------------+ ICA Distal82      26                                tortuous           +----------+--------+--------+--------+------------------+------------------+ ECA       78      16                                                   +----------+--------+--------+--------+------------------+------------------+ +----------+--------+-------+----------------+-------------------+           PSV cm/sEDV cmsDescribe        Arm Pressure (mmHG) +----------+--------+-------+----------------+-------------------+ [id number]             Multiphasic, WNL                    +----------+--------+-------+----------------+-------------------+ +---------+--------+--+--------+--+---------+ VertebralPSV cm/s43EDV cm/s11Antegrade +---------+--------+--+--------+--+---------+  Left Carotid Findings: +----------+--------+--------+--------+------------------+--------+           PSV cm/sEDV cm/sStenosisPlaque DescriptionComments +----------+--------+--------+--------+------------------+--------+ CCA Prox  83      12                                         +----------+--------+--------+--------+------------------+--------+ CCA Distal63      23                                         +----------+--------+--------+--------+------------------+--------+ ICA Prox  73      29                                         +----------+--------+--------+--------+------------------+--------+ ICA Distal77      26                                         +----------+--------+--------+--------+------------------+--------+  ECA       84      21                                         +----------+--------+--------+--------+------------------+--------+ +----------+--------+--------+----------------+-------------------+           PSV cm/sEDV cm/sDescribe        Arm Pressure (mmHG)  +----------+--------+--------+----------------+-------------------+ [id number]              Multiphasic, WNL                    +----------+--------+--------+----------------+-------------------+ +---------+--------+--+--------+--+---------+ VertebralPSV cm/s44EDV cm/s15Antegrade +---------+--------+--+--------+--+---------+   Summary: Right Carotid: The extracranial vessels were near-normal with only minimal wall                thickening or plaque. Left Carotid: The extracranial vessels were near-normal with only minimal wall               thickening or plaque. Vertebrals:  Bilateral vertebral arteries demonstrate antegrade flow. Subclavians: Normal flow hemodynamics were seen in bilateral subclavian              arteries. *See table(s) above for measurements and observations.  Electronically signed by Ardella Beaver MD on 12/25/2023 at 11:55:45 AM.    Final    ECHOCARDIOGRAM COMPLETE Result Date: 12/24/2023    ECHOCARDIOGRAM REPORT   Patient Name:   OLUWAFUNMILAYO WHIPKEY Date of Exam: 12/24/2023 Medical Rec #:  [id number]     Height:       62.0 in Accession #:    [id number]    Weight:       101.0 lb Date of Birth:  February 08, 1956     BSA:          1.430 m Patient Age:    67 years      BP:           145/87 mmHg Patient Gender: F             HR:           100 bpm. Exam Location:  Inpatient Procedure: 2D Echo, Cardiac Doppler and Color Doppler (Both Spectral and Color            Flow Doppler were utilized during procedure). Indications:    Syncope R55  History:        Patient has no prior history of Echocardiogram examinations.                 Risk Factors:Dyslipidemia.  Sonographer:    Hersey Lorenzo RDCS Referring Phys: [phone] DAVID MANUEL ORTIZ IMPRESSIONS  1. Left ventricular ejection fraction, by estimation, is 65 to 70%. The left ventricle has normal function. The left ventricle has no regional wall motion abnormalities.  2. Right ventricular systolic function is normal. The right ventricular size is normal.   3. Trivial mitral valve regurgitation.  4. The aortic valve is normal in structure. Aortic valve regurgitation is trivial.  5. The inferior vena cava is normal in size with greater than 50% respiratory variability, suggesting right atrial pressure of 3 mmHg. FINDINGS  Left Ventricle: Left ventricular ejection fraction, by estimation, is 65 to 70%. The left ventricle has normal function. The left ventricle has no regional wall motion abnormalities. The left ventricular internal cavity size was normal in size. There is  no left ventricular hypertrophy. Right Ventricle: The right ventricular size is normal. Right  vetricular wall thickness was not assessed. Right ventricular systolic function is normal. Left Atrium: Left atrial size was normal in size. Right Atrium: Right atrial size was normal in size. Pericardium: There is no evidence of pericardial effusion. Mitral Valve: There is mild thickening of the mitral valve leaflet(s). Trivial mitral valve regurgitation. Tricuspid Valve: The tricuspid valve is normal in structure. Tricuspid valve regurgitation is mild. Aortic Valve: The aortic valve is normal in structure. Aortic valve regurgitation is trivial. Pulmonic Valve: The pulmonic valve was not well visualized. Pulmonic valve regurgitation is not visualized. No evidence of pulmonic stenosis. Aorta: The aortic root and ascending aorta are structurally normal, with no evidence of dilitation. Venous: The inferior vena cava is normal in size with greater than 50% respiratory variability, suggesting right atrial pressure of 3 mmHg. IAS/Shunts: No atrial level shunt detected by color flow Doppler.  LEFT VENTRICLE PLAX 2D LVIDd:         4.70 cm   Diastology LVIDs:         2.80 cm   LV e' lateral: 8.49 cm/s LV PW:         1.00 cm LV IVS:        1.00 cm LVOT diam:     2.20 cm LV SV:         62 LV SV Index:   43 LVOT Area:     3.80 cm  RIGHT VENTRICLE             IVC RV S prime:     11.40 cm/s  IVC diam: 1.00 cm TAPSE  (M-mode): 1.4 cm LEFT ATRIUM           Index LA diam:      2.40 cm 1.68 cm/m LA Vol (A4C): 9.9 ml  6.91 ml/m  AORTIC VALVE LVOT Vmax:   102.00 cm/s LVOT Vmean:  69.600 cm/s LVOT VTI:    0.163 m  AORTA Ao Root diam: 2.80 cm Ao Asc diam:  3.00 cm  SHUNTS Systemic VTI:  0.16 m Systemic Diam: 2.20 cm Ola Berger MD Electronically signed by Ola Berger MD Signature Date/Time: 12/24/2023/3:19:36 PM    Final    MR Lumbar Spine W Wo Contrast Result Date: 12/24/2023 CLINICAL DATA:  68 year old female with low back pain, rectal cancer, invasive presacral soft tissue mass at the sacral promontory on CT. EXAM: MRI LUMBAR SPINE WITHOUT AND WITH CONTRAST TECHNIQUE: Multiplanar and multiecho pulse sequences of the lumbar spine were obtained without and with intravenous contrast. CONTRAST:  5mL GADAVIST  GADOBUTROL  1 MMOL/ML IV SOLN COMPARISON:  CT Abdomen and Pelvis, lumbar spine CT 12/22/2023. FINDINGS: Segmentation: Normal on the comparison CT. There is a vestigial S1-S2 disc space. Alignment: Maintained lumbar lordosis. Maintained alignment of the visible sacral segments. Vertebrae: Normal background bone marrow signal, and no acute or suspicious bone lesion identified in the lumbar or visible lower thoracic levels. However, extensive tumor infiltration of the sacrum at the S1 level, throughout the central sacrum there and in the sacral ala right greater than left (series 9, images 35 through 40). This appears associated with indistinct presacral soft tissue. The affected bone is heterogeneously enhancing and edematous. And there is epidural tumor throughout the right S1 neural foramen, inseparable from the nerve there. Left S1 neural foramen relatively spared. No sacral spinal canal involvement. 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 Conus medullaris and cauda equina: Conus extends to the L1-L2 level. No lower spinal cord or conus signal abnormality. Normal cauda equina nerve roots.  No abnormal enhancement above  the right S1 nerve. No lumbar dural thickening or enhancement. Paraspinal and other soft tissues: Severe right hydronephrosis and hydroureter again noted. Disc levels: No age advanced lower thoracic or lumbar spine degeneration. Capacious spinal canal throughout those levels. Malignant impingement of the right S1 nerve as above. IMPRESSION: 1. 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 2. No other tumor or metastatic disease identified in the Lumbar Spine. 3. Ongoing Right Hydronephrosis and hydroureter. Electronically Signed   By: Marlise Simpers M.D.   On: 12/24/2023 07:50   CT CHEST W CONTRAST Result Date: 12/24/2023 CLINICAL DATA:  Rectal cancer. Evaluate for metastases. Restaging. * Tracking Code: BO * EXAM: CT CHEST WITH CONTRAST TECHNIQUE: Multidetector CT imaging of the chest was performed during intravenous contrast administration. RADIATION DOSE REDUCTION: This exam was performed according to the departmental dose-optimization program which includes automated exposure control, adjustment of the mA and/or kV according to patient size and/or use of iterative reconstruction technique. CONTRAST:  75mL OMNIPAQUE  IOHEXOL  300 MG/ML  SOLN COMPARISON:  05/21/2022 FINDINGS: Cardiovascular: The heart size is normal. No substantial pericardial effusion. Ascending thoracic aorta measures 3.2 cm diameter. Mediastinum/Nodes: No mediastinal lymphadenopathy. 1.8 x 1.6 cm soft tissue nodule is identified in the right suprahilar lung (axial soft tissue window 49 of series 2 and axial lung window 48 of series 8) compatible with abnormal hilar lymph node or central right upper lobe pulmonary nodule. No left hilar lymphadenopathy. The esophagus has normal imaging features. There is no axillary lymphadenopathy. Lungs/Pleura: Centrilobular and paraseptal emphysema evident. Biapical pleuroparenchymal scarring evident. As noted  above, there is a 1.8 cm nodular lesion in the right suprahilar region (49/8). No other suspicious pulmonary nodule or mass. 3 mm peripheral left upper lobe nodule on 58/8 is stable since the previous study. Subtle 6 mm ground-glass opacity in the left upper lobe on 61/8 is unchanged. No pleural effusion. Upper Abdomen: Scattered tiny hypodensities in the liver parenchyma are too small to characterize but are statistically most likely benign. No followup imaging is recommended. Right-sided hydronephrosis seen on today's study was better characterized on yesterday's dedicated abdomen and pelvis CT. High density material in the dilated right intrarenal collecting system and renal pelvis is probably residual contrast from the previous CT. Musculoskeletal: No worrisome lytic or sclerotic osseous abnormality. IMPRESSION: 1. 1.8 x 1.6 cm soft tissue nodule in the right suprahilar region compatible with abnormal hilar lymph node or central right upper lobe pulmonary nodule. Imaging features are suspicious for metastatic disease although synchronous primary bronchogenic neoplasm is also a consideration. PET-CT may prove helpful to further evaluate as clinically warranted. 2. No other evidence for metastatic disease in the chest. 3. Right-sided hydronephrosis seen on today's study was better characterized on yesterday's dedicated abdomen and pelvis CT. 4.  Emphysema. ([id number]) Electronically Signed   By: Donnal Fusi M.D.   On: 12/24/2023 06:43   CT L-SPINE NO CHARGE Result Date: 12/22/2023 CLINICAL DATA:  Recurrent right leg pain, right thigh pain, right calf pain. History of colon cancer. * Tracking Code: BO * EXAM: CT LUMBAR SPINE WITHOUT CONTRAST TECHNIQUE: Multidetector CT imaging of the lumbar spine was performed without intravenous contrast administration. Multiplanar CT image reconstructions were also generated. RADIATION DOSE REDUCTION: This exam was performed according to the departmental dose-optimization  program which includes automated exposure control, adjustment of the mA and/or kV according to patient size  and/or use of iterative reconstruction technique. COMPARISON:  Findings are correlated with concurrently performed CT examination the abdomen and pelvis. FINDINGS: Segmentation: 5 lumbar type vertebrae. Alignment: Normal. Vertebrae: There is malignant erosion of the right anterosuperior aspect of the S1 vertebral body, best appreciated on image # 40/17 secondary superior endplate fracture of S1 with mild loss of height anteriorly. No other fracture identified. Remaining vertebral body height is preserved. Paraspinal and other soft tissues: 1.6 x 3.3 cm infiltrative soft tissue mass is seen along the right anterolateral aspect of S1 demonstrating perineural extension into the right S1 neural foramen without involvement of the thecal sac itself. The mass demonstrates encasement of the proximal right common iliac artery, best seen image # 107/20. The mass is indistinguishable from the adjacent L5-S1 intervertebral disc space though disc space height is preserved. 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 Multiple surgical clips are seen in the region of the mass, several of which are encased by the mass. See complete report for CT examination the abdomen and pelvis. Disc levels: Intervertebral disc heights are preserved. Spinal canal is widely patent. No significant neuroforaminal narrowing. IMPRESSION: 1. 1.6 x 3.3 cm infiltrative soft tissue mass along the right anterolateral aspect of S1 2. Perineural extension into the right S1 neural foramen without involvement of the thecal sac itself. 3. 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 4. Associated malignant erosion of the right anterosuperior aspect of the S1 vertebral body with mild loss of height anteriorly. 5. The mass is indistinguishable from the adjacent L5-S1 intervertebral disc space though disc space  height is preserved. 6. Encasement of the proximal right common iliac artery. 7. These findings would be better assessed with contrast enhanced MRI examination Electronically Signed   By: Worthy Heads M.D.   On: 12/22/2023 22:18   CT ABDOMEN PELVIS W CONTRAST Result Date: 12/22/2023 CLINICAL DATA:  Abdominal and flank pain, recurrent right leg pain. Colon cancer. * Tracking Code: BO * EXAM: CT ABDOMEN AND PELVIS WITH CONTRAST TECHNIQUE: Multidetector CT imaging of the abdomen and pelvis was performed using the standard protocol following bolus administration of intravenous contrast. RADIATION DOSE REDUCTION: This exam was performed according to the departmental dose-optimization program which includes automated exposure control, adjustment of the mA and/or kV according to patient size and/or use of iterative reconstruction technique. CONTRAST:  96mL OMNIPAQUE  IOHEXOL  300 MG/ML  SOLN COMPARISON:  02/03/2023, MRI 06/13/2023 FINDINGS: Lower chest: No acute abnormality. Hepatobiliary: Mild hepatic steatosis. Scattered simple hepatic cysts. No enhancing intrahepatic mass. No intra or extrahepatic biliary ductal dilation. Gallbladder unremarkable. Pancreas: Unremarkable Spleen: Numerous previously identified simple cyst within the spleen have decreased in size. No enhancing intra splenic mass. The spleen is normal in size. No perisplenic fluid collection. Adrenals/Urinary Tract: The adrenal glands are unremarkable. 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 This is best seen on axial image # 40/2 and sagittal image # 71/10. Interval development of mild associated right renal cortical atrophy suggesting this represents a mole standing obstruction. Delayed right renal cortical enhancement in keeping with high-grade obstruction. The left kidney is unremarkable. Bladder  unremarkable. Stomach/Bowel: Surgical changes of sigmoid colectomy are identified. Stomach, small bowel, and large bowel are otherwise unremarkable there is no evidence of obstruction or focal inflammation. Appendix absent. Numerous surgical clips are seen within the retroperitoneum. Adjacent to these clips is a new  infiltrative soft tissue mass measuring at least 4.5 x 1.7 cm at axial image # 38/2 abutting the right anterolateral margin of the S1 vertebral body with malignant destruction of the anterosuperior aspect of the vertebral body best seen on image # 37/2 and perineural extension into the right S1 neural foramen, best seen on image # 36-37/2. The mass is poorly delineated from the adjacent L5-S1 disc space though I suspect this structure is abutted but not directly invaded. Vascular/Lymphatic: Mild aortoiliac atherosclerotic calcification. The previously described recurrence retroperitoneal soft tissue mass abuts and encases the right internal iliac artery proximally at axial image # 38/2 and likely abuts but does not encase the left common iliac vein. The abdominal vasculature is otherwise unremarkable. Reproductive: Status post hysterectomy. No adnexal masses. Other: No abdominal wall hernia Musculoskeletal: No acute bone abnormality. IMPRESSION: 1. Recurrent retroperitoneal soft tissue mass within the retroperitoneum abutting the right anterolateral margin of the S1 vertebral body resulting in malignant destruction of the anterosuperior aspect of the S1 vertebral body and perineural extension into the right S1 neural foramen. Encasement of the proximal right internal iliac artery. 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 2. Mild hepatic steatosis. 3. Surgical changes of sigmoid colectomy. 4.  Aortic Atherosclerosis (ICD10-I70.0). Electronically Signed   By: Worthy Heads M.D.   On: 12/22/2023 22:09   CT Head Wo Contrast Result Date:  12/22/2023 CLINICAL DATA:  Syncope/presyncope, cerebrovascular cause suspected EXAM: CT HEAD WITHOUT CONTRAST TECHNIQUE: Contiguous axial images were obtained from the base of the skull through the vertex without intravenous contrast. RADIATION DOSE REDUCTION: This exam was performed according to the departmental dose-optimization program which includes automated exposure control, adjustment of the mA and/or kV according to patient size and/or use of iterative reconstruction technique. COMPARISON:  None Available. FINDINGS: Brain: No evidence of large-territorial acute infarction. No parenchymal hemorrhage. No mass lesion. No extra-axial collection. No mass effect or midline shift. No hydrocephalus. Basilar cisterns are patent. Vascular: No hyperdense vessel. Skull: No acute fracture or focal lesion. Sinuses/Orbits: Paranasal sinuses and mastoid air cells are clear. The orbits are unremarkable. Other: Left parieto-occipital scalp soft tissue density with calcification likely a sebaceous cyst. IMPRESSION: No acute intracranial abnormality. Electronically Signed   By: Morgane  Naveau M.D.   On: 12/22/2023 19:44   XR Pelvis 1-2 Views Result Date: 12/15/2023 AP pelvis some degenerative changes of the hips by back laterally with some sclerotic changes in the superior acetabulum no acute fractures  XR Lumbar Spine 2-3 Views Result Date: 12/15/2023 Radiographs of the lumbar spine demonstrate overall well-maintained alignment some endplate sclerotic changes and some facet arthropathy no acute fractures degenerative changes noted mostly at L5-S1    Past medical hx 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   Drug use: Never    Ms.Stillinger reports that she quit smoking 7 days ago. Her smoking use included cigarettes. She has a 12.5 pack-year smoking history. She has been exposed to tobacco smoke. She has never used smokeless tobacco. She reports current alcohol use. She reports that she does not use drugs.  Tobacco Cessation: Counseling given: Not Answered Pt. Has quit smoking completely  Past surgical hx, Family hx, Social hx all reviewed.  Current Outpatient Medications on File Prior to Visit  Medication Sig   acetaminophen  (TYLENOL ) 500 MG tablet Take 500-1,000 mg by mouth every 6 (six) hours as needed (PAIN).   alendronate (FOSAMAX) 70 MG tablet Take 70 mg by mouth once a week. 1 tablet 30 minutes before the first food, beverage or medicine of the day with plain water Orally once weekly for 84 days   Calcium  Citrate 250 MG TABS Take 1 tablet by mouth in the morning.   Cholecalciferol 50 MCG (2000 UT) CAPS Take 1 capsule by mouth daily.   cyanocobalamin  (VITAMIN B12) 1000 MCG tablet Take 1,000 mcg by mouth daily.   gabapentin  (NEURONTIN ) 300 MG capsule Take 1 capsule (300 mg total) by mouth 3 (three) times daily.   omeprazole 20 MG TBDD disintegrating tablet Take 20 mg by mouth daily.   rosuvastatin  (CRESTOR ) 5 MG tablet Take 5 mg by mouth in the morning. (0800)   No current facility-administered medications on file prior to visit.     No Known Allergies  Review Of Systems:  Constitutional:   No  weight loss, night sweats,  Fevers, chills, fatigue, or  lassitude.  HEENT:   No headaches,  Difficulty swallowing,   Tooth/dental problems, or  Sore throat,                No sneezing, itching, ear ache, nasal congestion, post nasal drip,   CV:  No chest pain,  Orthopnea, PND, swelling in lower extremities, anasarca, dizziness, palpitations, syncope.   GI  No heartburn, indigestion, abdominal pain, nausea, vomiting, diarrhea, change in bowel habits, loss of appetite, bloody stools.   Resp: No shortness of breath with exertion or at rest.  No excess mucus, no productive cough,  No non-productive cough,  No coughing up of blood.  No change in color of mucus.  No wheezing.  No chest wall deformity  Skin: no rash or lesions.  GU: no dysuria, change in color of urine, no urgency or frequency.  No flank pain, no hematuria   MS:  No joint pain or swelling.  No decreased range of motion.  No back pain.  Psych:  No change in mood or affect. No depression or anxiety.  No memory loss.   Vital Signs BP (!) 174/80 (BP Location: Left Arm, Patient Position: Sitting, Cuff Size: Normal)   Pulse 63   Ht 5\' 3"  (1.6 m)   Wt 104 lb 3.2 oz (47.3 kg)   SpO2 97%   BMI 18.46 kg/m    Physical Exam:  General- No distress,  A&Ox3, pleasant ENT: No sinus tenderness, TM clear, pale nasal mucosa, no oral exudate,no post nasal drip, no LAN Cardiac: S1, S2, regular rate and rhythm, no murmur Chest: No wheeze/ rales/ dullness; no accessory muscle use, no nasal flaring, no sternal retractions, slightly diminished per bases Abd.: Soft Non-tender, nondistended, bowel sounds positive,Body mass index is 18.46 kg/m.  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 You biopsy was positive for adenocarcinoma of the lung. You have seen Dr. Maria Shiner, He will manage both your lung cancer and spinal sacral cancer He has ordered an MRI of the brain to complete staging. He is also referred you to radiation oncology to start treatment. He is planning to do molecular testing to determine potential future treatment options. You will great great care at the [facility]. Congratulations on quitting smoking. That is so amazing. Keep up the great work Follow up with us  as needed. Call if you need us .  Delynn Fill with your treatment. Please contact office for sooner follow up if symptoms do not improve or worsen or seek emergency care    I spent 45 minutes dedicated to the care of this patient on the date of this encounter to include pre-visit review of records, face-to-face time with the patient discussing conditions above, post visit ordering of testing, clinical documentation with the electronic health record, making appropriate referrals as documented, and communicating necessary information to the patient's healthcare team.    Raejean Bullock, NP 01/11/2024  8:37 AM

## 2024-01-11 NOTE — Progress Notes (Signed)
 Patient has consult with RadOnc on 01/17/2024. She also needs an MRI brain which is also scheduled on 01/17/2024. Once upfront radiation is completed, a decision will be made about systemic therapy. Molecular studies should be back by that time.   Called patient to see if she had any further questions or concerns. Voicemail left requesting call back.   Patient called back. We reviewed treatment timeline. Answered all her questions to her satisfaction.   Oncology Nurse Navigator Documentation     01/11/2024   11:30 AM  Oncology Nurse Navigator Flowsheets  Navigator Follow Up Date: 01/17/2024  Navigator Follow Up Reason: Scan Review;Review Note  Financial risk analyst Encounter Type Appt/Treatment Plan Review;Telephone  Telephone Outgoing Call  Patient Visit Type MedOnc  Treatment Phase Pre-Tx/Tx Discussion  Barriers/Navigation Needs Coordination of Care  Interventions Psycho-Social Support  Acuity Level 2-Minimal Needs (1-2 Barriers Identified)  Education Method Verbal  Support Groups/Services Friends and Family  Time Spent with Patient 15

## 2024-01-13 DIAGNOSIS — N130 Hydronephrosis with ureteropelvic junction obstruction: Secondary | ICD-10-CM | POA: Diagnosis not present

## 2024-01-13 NOTE — Progress Notes (Signed)
 Location of tumor and Histology per Pathology Report: Lung with spinal mets    FINAL MICROSCOPIC DIAGNOSIS: A. LYMPH NODE, 10R, FINE NEEDLE ASPIRATION: - Malignant - Adenocarcinoma consistent with lung primary  COMMENT:  The ThinPrep and cell block show scattered loosely cohesive groups of large atypical epithelial cells with enlarged round to oval irregular nuclei with inconspicuous nucleoli. Five immunohistochemical stains are performed with adequate control to further elucidate the histogenesis of this tumor.  The tumor is positive for cytokeratin 7 and the pulmonary adeno marker TTF-1.  The tumor is negative for the squamous marker p40. The tumor is also negative for the GI/colonic markers cytokeratin 20 and CDX2.  The cytohistomorphology and immunohistochemical pattern support the above diagnosis.  Case is reviewed by Dr. Melene Sportsman who concurs with the interpretation.  Diagnosis conveyed to Dr. Baldwin Levee by Dr. Kavin Parsley via Epic messaging on 01/07/2024 @ 10:00 AM.    Biopsy:   Past/Anticipated interventions by surgeon, if any:   Past/Anticipated interventions by medical oncology, if any:    If Spine Met(s), symptoms, if any, include: Bowel/Bladder retention or incontinence (please describe): Denies Numbness or weakness in extremities (please describe): Denies Current Decadron  regimen, if applicable: Denies  Ambulatory status? Walker? Wheelchair?: Denies     Pain issues, if any:  yes constant, sharp, and aching  SAFETY ISSUES: Prior radiation? No Pacemaker/ICD? No Possible current pregnancy? No Is the patient on methotrexate? No  Current Complaints / other details:  She wants to be educated on the plan for radiation and hope it helps.      BP (!) 161/94 (BP Location: Left Arm, Patient Position: Sitting)   Pulse 79   Temp 97.6 F (36.4 C)   Resp 18   Ht 5\' 3"  (1.6 m)   Wt 100 lb 9.6 oz (45.6 kg)   SpO2 99%   BMI 17.82 kg/m

## 2024-01-15 NOTE — Progress Notes (Signed)
 Radiation Oncology         [phone] ________________________________  Initial Outpatient Consultation  Name: Mckenley Gauvin MRN: [id number]  Date: 01/17/2024  DOB: Mar 01, 1956  WG:NFAO, Altamese Jest, MD  Ivor Mars, MD   REFERRING PHYSICIAN: Ivor Mars, MD  DIAGNOSIS: There were no encounter diagnoses.  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 female who is accompanied by ***. she is seen as a courtesy of Dr. Maria Shiner for an opinion concerning radiation therapy as part of management for her recently diagnosed metastatic lung cancer. She has a medical history notable for early stage colon cancer. She is also a former smoker and just recently quit following her diagnosis.    The patient presented to the ED on 12/22/23 after having a syncopal episode at home as well as abdominal/flank pain and recurrent right leg pain. In the setting of her history of colon cancer, she was accordingly admitted and underwent a CT AP which demonstrated: a recurrent  retroperitoneal soft tissue mass within the retroperitoneum abutting the right anterolateral margin of the S1 vertebral body, resulting in malignant destruction of the anterosuperior aspect of the S1 vertebral body, and with perineural extension into the right S1 neural foramen. The mass also appeared to encase the proximal right internal iliac artery, and severely occlude the distal right ureter.   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   A chest CT was then performed on 04/17 which further revealed a 1.8 x 1.6 cm soft tissue nodule in the right suprahilar region compatible with abnormal hilar lymph node or central right upper lobe pulmonary nodule. Differential considerations for this finding included metastatic disease vs a synchronous primary bronchogenic neoplasm. CT findings thankfully otherwise showed no other evidence of metastatic disease in the chest.   To further evaluate CT findings, an MRI of the lumbar spine was performed on 04/18 which again demonstrated extensive tumor infiltration of the sacrum at the S1 level (asymmetric to the right), and severe involvement of the Right S1 nerve and right S1 neural foramen. MRI findings otherwise showed no other evidence of tumor or metastatic disease in the lumbar spine.   She was evaluated by IR and neurosurgery while inpatient, both of whom could not find a biopsy amendable site, and she was accordingly discharged on 12/26/23 with a referral to OP pulmonology for consideration of bronchoscopy to biopsy the right suprahilar nodule. Hospital course otherwise consisted of gabapentin  for nerve pain and dexamethasone .   She was promptly evaluated by Dr. Baldwin Levee and was able to proceed with a bronchoscopy and EBUS on 01/04/24. EBUS findings demonstrated the enlarged irregular right hilar node at station 10R. FNA  of the right hilar node was accordingly collected and showed adenocarcinoma, consistent with a lung primary.   To complete her staging work-up, a PET scan was performed on 01/03/24 which demonstrated: a large  heterogeneous area of abnormal uptake involving the upper sacrum, with abnormal soft tissue extending into the presacral space and along the right neural foramen at S1-2, and involving the course of the right ureter with moderate to severe right renal collecting system dilatation present. Hypermetabolism associated with the recently biopsied right suprahilar node was also demonstrated. No other sites of metastatic disease were appreciated.    She was subsequently referred to oncology and was recently seen in consultation by Dr. Maria Shiner on 01/10/24. For treatment, Dr. Maria Shiner recommends first proceeding with radiotherapy to the retroperitoneal region and the sacral area which we will discuss in detail today. After she completes radiotherapy to these areas, he has also recommended potential SBRT to the right hilar node.    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    PAST SURGICAL HISTORY: Past Surgical History:  Procedure Laterality Date   ABDOMINAL HYSTERECTOMY  1999   [name], MD   BIOPSY  01/01/2021   Procedure: BIOPSY;  Surgeon: Joyce Nixon, MD;  Location: [facility] ENDOSCOPY;  Service: Endoscopy;;   BRONCHIAL NEEDLE ASPIRATION BIOPSY  01/04/2024   Procedure: BRONCHOSCOPY, WITH NEEDLE ASPIRATION BIOPSY;  Surgeon: Denson Flake, MD;  Location: [facility] ENDOSCOPY;  Service: Pulmonary;;   ENDOBRONCHIAL ULTRASOUND Right 01/04/2024   Procedure: ENDOBRONCHIAL ULTRASOUND (EBUS);  Surgeon: Denson Flake, MD;  Location: [facility] ENDOSCOPY;  Service: Pulmonary;  Laterality: Right;   FLEXIBLE SIGMOIDOSCOPY N/A 01/01/2021   Procedure: FLEXIBLE SIGMOIDOSCOPY WITH BIOPSY;   Surgeon: Joyce Nixon, MD;  Location: [facility] ENDOSCOPY;  Service: Endoscopy;  Laterality: N/A;  IV SEDATION BY SURGEON   XI ROBOTIC ASSISTED LOWER ANTERIOR RESECTION N/A 02/21/2021   Procedure: XI ROBOTIC ASSISTED LOWER ANTERIOR RESECTION;  Surgeon: Joyce Nixon, MD;  Location: [facility] ORS;  Service: General;  Laterality: N/A;    FAMILY HISTORY: No family history on file.  SOCIAL HISTORY:  Social History   Tobacco Use   Smoking status: Former    Current packs/day: 0.00    Average packs/day: 0.5 packs/day for 25.0 years (12.5 ttl pk-yrs)    Types: Cigarettes    Quit date: 01/04/2024    Years since quitting: 0.0    Passive exposure: Current   Smokeless tobacco: Never  Vaping Use   Vaping status: Never Used  Substance Use Topics   Alcohol use: Yes    Comment: occas.   Drug use: Never    ALLERGIES: No Known Allergies  MEDICATIONS:  Current Outpatient Medications  Medication Sig Dispense Refill   acetaminophen  (TYLENOL ) 500 MG tablet Take 500-1,000 mg by mouth every 6 (six) hours as needed (PAIN).     alendronate (FOSAMAX) 70 MG tablet Take 70 mg by mouth once a week. 1 tablet 30 minutes before the first food, beverage or medicine of the day with plain water Orally once weekly for 84 days     Calcium  Citrate 250 MG TABS Take 1 tablet by mouth in the morning.     Cholecalciferol 50 MCG (2000  UT) CAPS Take 1 capsule by mouth daily.     cyanocobalamin  (VITAMIN B12) 1000 MCG tablet Take 1,000 mcg by mouth daily.     gabapentin  (NEURONTIN ) 300 MG capsule Take 1 capsule (300 mg total) by mouth 3 (three) times daily. 90 capsule 1   omeprazole 20 MG TBDD disintegrating tablet Take 20 mg by mouth daily.     rosuvastatin  (CRESTOR ) 5 MG tablet Take 5 mg by mouth in the morning. (0800)     No current facility-administered medications for this encounter.    REVIEW OF SYSTEMS:  A 10+ POINT REVIEW OF SYSTEMS WAS OBTAINED including neurology, dermatology, psychiatry, cardiac, respiratory, lymph,  extremities, GI, GU, musculoskeletal, constitutional, reproductive, HEENT. ***   PHYSICAL EXAM:  vitals were not taken for this visit.   General: Alert and oriented, in no acute distress HEENT: Head is normocephalic. Extraocular movements are intact. Oropharynx is clear. Neck: Neck is supple, no palpable cervical or supraclavicular lymphadenopathy. Heart: Regular in rate and rhythm with no murmurs, rubs, or gallops. Chest: Clear to auscultation bilaterally, with no rhonchi, wheezes, or rales. Abdomen: Soft, nontender, nondistended, with no rigidity or guarding. Extremities: No cyanosis or edema. Lymphatics: see Neck Exam Skin: No concerning lesions. Musculoskeletal: symmetric strength and muscle tone throughout. Neurologic: Cranial nerves II through XII are grossly intact. No obvious focalities. Speech is fluent. Coordination is intact. Psychiatric: Judgment and insight are intact. Affect is appropriate. ***  ECOG = ***  0 - Asymptomatic (Fully active, able to carry on all predisease activities without restriction)  1 - Symptomatic but completely ambulatory (Restricted in physically strenuous activity but ambulatory and able to carry out work of a light or sedentary nature. For example, light housework, office work)  2 - Symptomatic, <50% in bed during the day (Ambulatory and capable of all self care but unable to carry out any work activities. Up and about more than 50% of waking hours)  3 - Symptomatic, >50% in bed, but not bedbound (Capable of only limited self-care, confined to bed or chair 50% or more of waking hours)  4 - Bedbound (Completely disabled. Cannot carry on any self-care. Totally confined to bed or chair)  5 - Death   Aurea Blossom MM, Creech RH, Tormey DC, et al. [phone]). "Toxicity and response criteria of the [facility]". Am. Hillard Lowes. Oncol. 5 (6): 649-55  LABORATORY DATA:  Lab Results  Component Value Date   WBC 12.8 (H) 01/10/2024   HGB 12.2  01/10/2024   HCT 37.5 01/10/2024   MCV 92.6 01/10/2024   PLT 333 01/10/2024   NEUTROABS 10.9 (H) 01/10/2024   Lab Results  Component Value Date   NA 140 01/10/2024   K 4.7 01/10/2024   CL 103 01/10/2024   CO2 30 01/10/2024   GLUCOSE 99 01/10/2024   BUN 23 01/10/2024   CREATININE 0.95 01/10/2024   CALCIUM  9.9 01/10/2024      RADIOGRAPHY: DG Chest Port 1 View Result Date: 01/04/2024 CLINICAL DATA:  Status post bronchoscopy. EXAM: PORTABLE CHEST 1 VIEW COMPARISON:  PET-CT dated 01/03/2024.  CT chest dated 12/23/2023. FINDINGS: The heart size and mediastinal contours are within normal limits. No focal consolidation, pleural effusion, or pneumothorax. Hypermetabolic right suprahilar nodule is not well visualized on this exam. No acute osseous abnormality. IMPRESSION: 1. No acute cardiopulmonary findings. 2. Hypermetabolic right suprahilar nodule is not well visualized on this exam and better evaluated on the recent PET-CT dated 01/03/2024. Electronically Signed   By: Arcola Beards.D.  On: 01/04/2024 11:45   NM PET Image Initial (PI) Skull Base To Thigh Result Date: 01/03/2024 CLINICAL DATA:  Follow up treatment strategy for rectal cancer EXAM: NUCLEAR MEDICINE PET SKULL BASE TO THIGH TECHNIQUE: 5.63 mCi F-18 FDG was injected intravenously. Full-ring PET imaging was performed from the skull base to thigh after the radiotracer. CT data was obtained and used for attenuation correction and anatomic localization. Fasting blood glucose: 92 mg/dl COMPARISON:  Chest CT 16/06/9603. abdomen pelvis CT 12/22/2023. Older exams as well. FINDINGS: Mediastinal blood pool activity: SUV max 2.4 Liver activity: SUV max 2.4 NECK: Extensive brown fat uptake aligned fide along the posterior aspect of the neck sing in the supraclavicular region is. Some paraspinal areas as well and areas in the thoracic inlet. Otherwise no specific abnormal radiotracer uptake seen along lymph node change the neck including  submandibular, posterior triangle or internal jugular region. Near symmetric uptake of the visualized intracranial compartment. Incidental CT findings: Paranasal sinuses and mastoid air cells are clear. Parotid glands, submandibular glands and thyroid gland are unremarkable. CHEST: Once again there is extensive brown fat uptake identified in the chest including paraspinal, axillary, posterior mediastinal and pre cardiophrenic. Areas along chest wall as well. On prior chest CT there was a right suprahilar nodule. This could be either lung parenchymal or hilar node based on location. This has maximum SUV value of 10.0 and worrisome for neoplasm. Previously this measured 18 x 16 mm. No other areas of pathologic abnormal uptake along the hilum, mediastinum, axillary region or along the lung parenchyma. Incidental CT findings: There is some linear opacity lung bases likely scar or atelectasis. No pleural effusion, pneumothorax or effusion. Apical pleural thickening. Heart is nonenlarged. No significant pericardial effusion. ABDOMEN/PELVIS: There is physiologic distribution radiotracer along the parenchymal organs and bowel. Area of abnormal uptake identified with area of sclerosis and lucency along the upper sacrum. There is extension of abnormal uptake in tissue extending anterior to the sacrum this location. This has maximum SUV value of 10.9. There is associated dilatation of the right renal collecting system with some high density material along the lumen of the dilated system. The dilated ureter extends down to the pelvis near this location of the abnormal soft tissue and may be involved by the abnormal tissue. Please correlate with patient's renal function. This abnormal tissue extends in a line along the sacrum but also along the neural foramen at S1-2 and may extend into the central canal of the sacrum. There is some surgical clips in this location. Incidental CT findings: Diffuse colonic stool. There is some  contrast along the colon. No bowel obstruction. Mild vascular calcifications. Gallbladder is nondilated. There is some small cystic areas along the liver, unchanged from previous and again no abnormal uptake. Small splenic cystic foci also noted. Prior sigmoid colon resection. SKELETON: Again abnormal uptake involving the upper sacrum. No other areas of abnormal uptake along the visualized osseous structures. Curvature and degenerative changes along the spine. Degenerative changes along pelvis. IMPRESSION: Large heterogeneous area of abnormal uptake involving the upper sacrum with abnormal soft tissue extending into the presacral space as well as along the right neural foramen at S1-2. As described previously this appears to be involving the course of the right ureter with moderate to severe right renal collecting system dilatation. Please correlate with patient's renal function and signs of true ureteral obstruction. This could be invasive area of neoplasm. Hypermetabolic appearance to the nodule described right suprahilar. This could be an abnormal node versus  a true lung lesion. No other specific areas of abnormal uptake to suggest additional metastatic disease. Of note there is diffuse areas of brown fat uptake particularly along the neck and chest. Electronically Signed   By: Adrianna Horde M.D.   On: 01/03/2024 12:06   VAS US  CAROTID Result Date: 12/25/2023 Carotid Arterial Duplex Study Patient Name:  CIERAH ZAREMSKI  Date of Exam:   12/24/2023 Medical Rec #: [id number]      Accession #:    [id number] Date of Birth: 1955-09-24      Patient Gender: F Patient Age:   32 years Exam Location:  [facility] Procedure:      VAS US  CAROTID Referring Phys: DAVID ORTIZ --------------------------------------------------------------------------------  Indications:       Syncope. Risk Factors:      Hyperlipidemia, current smoker. Limitations        Today's exam was limited due to a VERY high bifurcation of                     the carotid bilaterally. Comparison Study:  No previous exams Performing Technologist: Jody Hill RVT, RDMS  Examination Guidelines: A complete evaluation includes B-mode imaging, spectral Doppler, color Doppler, and power Doppler as needed of all accessible portions of each vessel. Bilateral testing is considered an integral part of a complete examination. Limited examinations for reoccurring indications may be performed as noted.  Right Carotid Findings: +----------+--------+--------+--------+------------------+------------------+           PSV cm/sEDV cm/sStenosisPlaque DescriptionComments           +----------+--------+--------+--------+------------------+------------------+ CCA Prox  73      16                                intimal thickening +----------+--------+--------+--------+------------------+------------------+ CCA Distal91      27                                intimal thickening +----------+--------+--------+--------+------------------+------------------+ ICA Prox  59      23                                                   +----------+--------+--------+--------+------------------+------------------+ ICA Distal82      26                                tortuous           +----------+--------+--------+--------+------------------+------------------+ ECA       78      16                                                   +----------+--------+--------+--------+------------------+------------------+ +----------+--------+-------+----------------+-------------------+           PSV cm/sEDV cmsDescribe        Arm Pressure (mmHG) +----------+--------+-------+----------------+-------------------+ [id number]             Multiphasic, WNL                    +----------+--------+-------+----------------+-------------------+ +---------+--------+--+--------+--+---------+ VertebralPSV cm/s43EDV cm/s11Antegrade  +---------+--------+--+--------+--+---------+  Left Carotid Findings: +----------+--------+--------+--------+------------------+--------+           PSV cm/sEDV cm/sStenosisPlaque DescriptionComments +----------+--------+--------+--------+------------------+--------+ CCA Prox  83      12                                         +----------+--------+--------+--------+------------------+--------+ CCA Distal63      23                                         +----------+--------+--------+--------+------------------+--------+ ICA Prox  73      29                                         +----------+--------+--------+--------+------------------+--------+ ICA Distal77      26                                         +----------+--------+--------+--------+------------------+--------+ ECA       84      21                                         +----------+--------+--------+--------+------------------+--------+ +----------+--------+--------+----------------+-------------------+           PSV cm/sEDV cm/sDescribe        Arm Pressure (mmHG) +----------+--------+--------+----------------+-------------------+ [id number]              Multiphasic, WNL                    +----------+--------+--------+----------------+-------------------+ +---------+--------+--+--------+--+---------+ VertebralPSV cm/s44EDV cm/s15Antegrade +---------+--------+--+--------+--+---------+   Summary: Right Carotid: The extracranial vessels were near-normal with only minimal wall                thickening or plaque. Left Carotid: The extracranial vessels were near-normal with only minimal wall               thickening or plaque. Vertebrals:  Bilateral vertebral arteries demonstrate antegrade flow. Subclavians: Normal flow hemodynamics were seen in bilateral subclavian              arteries. *See table(s) above for measurements and observations.  Electronically signed by Ardella Beaver MD on 12/25/2023  at 11:55:45 AM.    Final    ECHOCARDIOGRAM COMPLETE Result Date: 12/24/2023    ECHOCARDIOGRAM REPORT   Patient Name:   TAWN BUROKER Date of Exam: 12/24/2023 Medical Rec #:  [id number]     Height:       62.0 in Accession #:    [id number]    Weight:       101.0 lb Date of Birth:  [birth year]     BSA:          1.430 m Patient Age:    67 years      BP:           145/87 mmHg Patient Gender: F             HR:           100 bpm. Exam Location:  Inpatient Procedure: 2D Echo, Cardiac Doppler and  Color Doppler (Both Spectral and Color            Flow Doppler were utilized during procedure). Indications:    Syncope R55  History:        Patient has no prior history of Echocardiogram examinations.                 Risk Factors:Dyslipidemia.  Sonographer:    Hersey Lorenzo RDCS Referring Phys: [phone] DAVID MANUEL ORTIZ IMPRESSIONS  1. Left ventricular ejection fraction, by estimation, is 65 to 70%. The left ventricle has normal function. The left ventricle has no regional wall motion abnormalities.  2. Right ventricular systolic function is normal. The right ventricular size is normal.  3. Trivial mitral valve regurgitation.  4. The aortic valve is normal in structure. Aortic valve regurgitation is trivial.  5. The inferior vena cava is normal in size with greater than 50% respiratory variability, suggesting right atrial pressure of 3 mmHg. FINDINGS  Left Ventricle: Left ventricular ejection fraction, by estimation, is 65 to 70%. The left ventricle has normal function. The left ventricle has no regional wall motion abnormalities. The left ventricular internal cavity size was normal in size. There is  no left ventricular hypertrophy. Right Ventricle: The right ventricular size is normal. Right vetricular wall thickness was not assessed. Right ventricular systolic function is normal. Left Atrium: Left atrial size was normal in size. Right Atrium: Right atrial size was normal in size. Pericardium: There is no evidence of pericardial  effusion. Mitral Valve: There is mild thickening of the mitral valve leaflet(s). Trivial mitral valve regurgitation. Tricuspid Valve: The tricuspid valve is normal in structure. Tricuspid valve regurgitation is mild. Aortic Valve: The aortic valve is normal in structure. Aortic valve regurgitation is trivial. Pulmonic Valve: The pulmonic valve was not well visualized. Pulmonic valve regurgitation is not visualized. No evidence of pulmonic stenosis. Aorta: The aortic root and ascending aorta are structurally normal, with no evidence of dilitation. Venous: The inferior vena cava is normal in size with greater than 50% respiratory variability, suggesting right atrial pressure of 3 mmHg. IAS/Shunts: No atrial level shunt detected by color flow Doppler.  LEFT VENTRICLE PLAX 2D LVIDd:         4.70 cm   Diastology LVIDs:         2.80 cm   LV e' lateral: 8.49 cm/s LV PW:         1.00 cm LV IVS:        1.00 cm LVOT diam:     2.20 cm LV SV:         62 LV SV Index:   43 LVOT Area:     3.80 cm  RIGHT VENTRICLE             IVC RV S prime:     11.40 cm/s  IVC diam: 1.00 cm TAPSE (M-mode): 1.4 cm LEFT ATRIUM           Index LA diam:      2.40 cm 1.68 cm/m LA Vol (A4C): 9.9 ml  6.91 ml/m  AORTIC VALVE LVOT Vmax:   102.00 cm/s LVOT Vmean:  69.600 cm/s LVOT VTI:    0.163 m  AORTA Ao Root diam: 2.80 cm Ao Asc diam:  3.00 cm  SHUNTS Systemic VTI:  0.16 m Systemic Diam: 2.20 cm Ola Berger MD Electronically signed by Ola Berger MD Signature Date/Time: 12/24/2023/3:19:36 PM    Final    MR Lumbar Spine W Wo Contrast Result Date: 12/24/2023  CLINICAL DATA:  68 year old female with low back pain, rectal cancer, invasive presacral soft tissue mass at the sacral promontory on CT. EXAM: MRI LUMBAR SPINE WITHOUT AND WITH CONTRAST TECHNIQUE: Multiplanar and multiecho pulse sequences of the lumbar spine were obtained without and with intravenous contrast. CONTRAST:  5mL GADAVIST  GADOBUTROL  1 MMOL/ML IV SOLN COMPARISON:  CT Abdomen and Pelvis,  lumbar spine CT 12/22/2023. FINDINGS: Segmentation: Normal on the comparison CT. There is a vestigial S1-S2 disc space. Alignment: Maintained lumbar lordosis. Maintained alignment of the visible sacral segments. Vertebrae: Normal background bone marrow signal, and no acute or suspicious bone lesion identified in the lumbar or visible lower thoracic levels. However, extensive tumor infiltration of the sacrum at the S1 level, throughout the central sacrum there and in the sacral ala right greater than left (series 9, images 35 through 40). This appears associated with indistinct presacral soft tissue. The affected bone is heterogeneously enhancing and edematous. And there is epidural tumor throughout the right S1 neural foramen, inseparable from the nerve there. Left S1 neural foramen relatively spared. No sacral spinal canal involvement. 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 Conus medullaris and cauda equina: Conus extends to the L1-L2 level. No lower spinal cord or conus signal abnormality. Normal cauda equina nerve roots. No abnormal enhancement above the right S1 nerve. No lumbar dural thickening or enhancement. Paraspinal and other soft tissues: Severe right hydronephrosis and hydroureter again noted. Disc levels: No age advanced lower thoracic or lumbar spine degeneration. Capacious spinal canal throughout those levels. Malignant impingement of the right S1 nerve as above. IMPRESSION: 1. 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 2. No other tumor or metastatic disease identified in the Lumbar Spine. 3. Ongoing Right Hydronephrosis and hydroureter. Electronically Signed   By: Marlise Simpers M.D.   On: 12/24/2023 07:50   CT CHEST W CONTRAST Result Date: 12/24/2023 CLINICAL DATA:  Rectal cancer. Evaluate for metastases. Restaging. * Tracking Code: BO * EXAM: CT CHEST WITH CONTRAST TECHNIQUE:  Multidetector CT imaging of the chest was performed during intravenous contrast administration. RADIATION DOSE REDUCTION: This exam was performed according to the departmental dose-optimization program which includes automated exposure control, adjustment of the mA and/or kV according to patient size and/or use of iterative reconstruction technique. CONTRAST:  75mL OMNIPAQUE  IOHEXOL  300 MG/ML  SOLN COMPARISON:  05/21/2022 FINDINGS: Cardiovascular: The heart size is normal. No substantial pericardial effusion. Ascending thoracic aorta measures 3.2 cm diameter. Mediastinum/Nodes: No mediastinal lymphadenopathy. 1.8 x 1.6 cm soft tissue nodule is identified in the right suprahilar lung (axial soft tissue window 49 of series 2 and axial lung window 48 of series 8) compatible with abnormal hilar lymph node or central right upper lobe pulmonary nodule. No left hilar lymphadenopathy. The esophagus has normal imaging features. There is no axillary lymphadenopathy. Lungs/Pleura: Centrilobular and paraseptal emphysema evident. Biapical pleuroparenchymal scarring evident. As noted above, there is a 1.8 cm nodular lesion in the right suprahilar region (49/8). No other suspicious pulmonary nodule or mass. 3 mm peripheral left upper lobe nodule on 58/8 is stable since the previous study. Subtle 6 mm ground-glass opacity in the left upper lobe on 61/8 is unchanged. No pleural effusion. Upper Abdomen: Scattered tiny hypodensities in the liver parenchyma are too small to characterize but are statistically most likely benign. No followup imaging is recommended. Right-sided hydronephrosis seen on today's study was better characterized on yesterday's dedicated  abdomen and pelvis CT. High density material in the dilated right intrarenal collecting system and renal pelvis is probably residual contrast from the previous CT. Musculoskeletal: No worrisome lytic or sclerotic osseous abnormality. IMPRESSION: 1. 1.8 x 1.6 cm soft tissue nodule  in the right suprahilar region compatible with abnormal hilar lymph node or central right upper lobe pulmonary nodule. Imaging features are suspicious for metastatic disease although synchronous primary bronchogenic neoplasm is also a consideration. PET-CT may prove helpful to further evaluate as clinically warranted. 2. No other evidence for metastatic disease in the chest. 3. Right-sided hydronephrosis seen on today's study was better characterized on yesterday's dedicated abdomen and pelvis CT. 4.  Emphysema. ([id number]) Electronically Signed   By: Donnal Fusi M.D.   On: 12/24/2023 06:43   CT L-SPINE NO CHARGE Result Date: 12/22/2023 CLINICAL DATA:  Recurrent right leg pain, right thigh pain, right calf pain. History of colon cancer. * Tracking Code: BO * EXAM: CT LUMBAR SPINE WITHOUT CONTRAST TECHNIQUE: Multidetector CT imaging of the lumbar spine was performed without intravenous contrast administration. Multiplanar CT image reconstructions were also generated. RADIATION DOSE REDUCTION: This exam was performed according to the departmental dose-optimization program which includes automated exposure control, adjustment of the mA and/or kV according to patient size and/or use of iterative reconstruction technique. COMPARISON:  Findings are correlated with concurrently performed CT examination the abdomen and pelvis. FINDINGS: Segmentation: 5 lumbar type vertebrae. Alignment: Normal. Vertebrae: There is malignant erosion of the right anterosuperior aspect of the S1 vertebral body, best appreciated on image # 40/17 secondary superior endplate fracture of S1 with mild loss of height anteriorly. No other fracture identified. Remaining vertebral body height is preserved. Paraspinal and other soft tissues: 1.6 x 3.3 cm infiltrative soft tissue mass is seen along the right anterolateral aspect of S1 demonstrating perineural extension into the right S1 neural foramen without involvement of the thecal sac itself.  The mass demonstrates encasement of the proximal right common iliac artery, best seen image # 107/20. The mass is indistinguishable from the adjacent L5-S1 intervertebral disc space though disc space height is preserved. 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 Multiple surgical clips are seen in the region of the mass, several of which are encased by the mass. See complete report for CT examination the abdomen and pelvis. Disc levels: Intervertebral disc heights are preserved. Spinal canal is widely patent. No significant neuroforaminal narrowing. IMPRESSION: 1. 1.6 x 3.3 cm infiltrative soft tissue mass along the right anterolateral aspect of S1 2. Perineural extension into the right S1 neural foramen without involvement of the thecal sac itself. 3. 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 4. Associated malignant erosion of the right anterosuperior aspect of the S1 vertebral body with mild loss of height anteriorly. 5. The mass is indistinguishable from the adjacent L5-S1 intervertebral disc space though disc space height is preserved. 6. Encasement of the proximal right common iliac artery. 7. These findings would be better assessed with contrast enhanced MRI examination Electronically Signed   By: Worthy Heads M.D.   On: 12/22/2023 22:18   CT ABDOMEN PELVIS W CONTRAST Result Date: 12/22/2023 CLINICAL DATA:  Abdominal and flank pain, recurrent right leg pain. Colon cancer. * Tracking Code: BO * EXAM: CT ABDOMEN AND PELVIS WITH CONTRAST TECHNIQUE: Multidetector CT imaging of the abdomen and pelvis was performed using the standard protocol following bolus administration of intravenous contrast. RADIATION DOSE REDUCTION: This exam was performed according to  the departmental dose-optimization program which includes automated exposure control, adjustment of the mA and/or kV according to patient size and/or use of iterative reconstruction technique. CONTRAST:   96mL OMNIPAQUE  IOHEXOL  300 MG/ML  SOLN COMPARISON:  02/03/2023, MRI 06/13/2023 FINDINGS: Lower chest: No acute abnormality. Hepatobiliary: Mild hepatic steatosis. Scattered simple hepatic cysts. No enhancing intrahepatic mass. No intra or extrahepatic biliary ductal dilation. Gallbladder unremarkable. Pancreas: Unremarkable Spleen: Numerous previously identified simple cyst within the spleen have decreased in size. No enhancing intra splenic mass. The spleen is normal in size. No perisplenic fluid collection. Adrenals/Urinary Tract: The adrenal glands are unremarkable. 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 This is best seen on axial image # 40/2 and sagittal image # 71/10. Interval development of mild associated right renal cortical atrophy suggesting this represents a mole standing obstruction. Delayed right renal cortical enhancement in keeping with high-grade obstruction. The left kidney is unremarkable. Bladder unremarkable. Stomach/Bowel: Surgical changes of sigmoid colectomy are identified. Stomach, small bowel, and large bowel are otherwise unremarkable there is no evidence of obstruction or focal inflammation. Appendix absent. Numerous surgical clips are seen within the retroperitoneum. Adjacent to these clips is a new infiltrative soft tissue mass measuring at least 4.5 x 1.7 cm at axial image # 38/2 abutting the right anterolateral margin of the S1 vertebral body with malignant destruction of the anterosuperior aspect of the vertebral body best seen on image # 37/2 and perineural extension into the right S1 neural foramen, best seen on image # 36-37/2. The mass is poorly delineated from the adjacent L5-S1 disc space though I suspect this structure is abutted but not directly invaded. Vascular/Lymphatic: Mild aortoiliac atherosclerotic calcification. The  previously described recurrence retroperitoneal soft tissue mass abuts and encases the right internal iliac artery proximally at axial image # 38/2 and likely abuts but does not encase the left common iliac vein. The abdominal vasculature is otherwise unremarkable. Reproductive: Status post hysterectomy. No adnexal masses. Other: No abdominal wall hernia Musculoskeletal: No acute bone abnormality. IMPRESSION: 1. Recurrent retroperitoneal soft tissue mass within the retroperitoneum abutting the right anterolateral margin of the S1 vertebral body resulting in malignant destruction of the anterosuperior aspect of the S1 vertebral body and perineural extension into the right S1 neural foramen. Encasement of the proximal right internal iliac artery. 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 2. Mild hepatic steatosis. 3. Surgical changes of sigmoid colectomy. 4.  Aortic Atherosclerosis (ICD10-I70.0). Electronically Signed   By: Worthy Heads M.D.   On: 12/22/2023 22:09   CT Head Wo Contrast Result Date: 12/22/2023 CLINICAL DATA:  Syncope/presyncope, cerebrovascular cause suspected EXAM: CT HEAD WITHOUT CONTRAST TECHNIQUE: Contiguous axial images were obtained from the base of the skull through the vertex without intravenous contrast. RADIATION DOSE REDUCTION: This exam was performed according to the departmental dose-optimization program which includes automated exposure control, adjustment of the mA and/or kV according to patient size and/or use of iterative reconstruction technique. COMPARISON:  None Available. FINDINGS: Brain: No evidence of large-territorial acute infarction. No parenchymal hemorrhage. No mass lesion. No extra-axial collection. No mass effect or midline shift. No hydrocephalus. Basilar cisterns are patent. Vascular: No hyperdense vessel. Skull: No acute fracture or focal lesion. Sinuses/Orbits: Paranasal sinuses and mastoid air cells  are clear. The orbits are unremarkable. Other: Left parieto-occipital scalp soft tissue density with calcification likely a sebaceous cyst. IMPRESSION: No  acute intracranial abnormality. Electronically Signed   By: Morgane  Naveau M.D.   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  We discussed the natural history of *** and general treatment, highlighting the role of radiotherapy in the management.  We discussed the available radiation techniques, and focused on the details of logistics and delivery.  We reviewed the anticipated acute and late sequelae associated with radiation in this setting.  The patient was encouraged to ask questions that I answered to the best of my ability. *** A patient consent form was discussed and signed.  We retained a copy for our records.  The patient would like to proceed with radiation and will be scheduled for CT simulation.  PLAN: ***    *** minutes of total time was spent for this patient encounter, including preparation, face-to-face counseling with the patient and coordination of care, physical exam, and documentation of the encounter.   ------------------------------------------------  Noralee Beam, PhD, MD  This document serves as a record of services personally performed by Retta Caster, MD. It was created on his behalf by Aleta Anda, a trained medical scribe. The creation of this record is based on the scribe's personal observations and the provider's statements to them. This document has been checked and approved by the attending provider.

## 2024-01-17 ENCOUNTER — Encounter: Payer: Self-pay | Admitting: Radiation Oncology

## 2024-01-17 ENCOUNTER — Other Ambulatory Visit: Payer: Self-pay | Admitting: *Deleted

## 2024-01-17 ENCOUNTER — Ambulatory Visit (HOSPITAL_COMMUNITY)
Admission: RE | Admit: 2024-01-17 | Discharge: 2024-01-17 | Discharge status: Home / Self Care | Source: Ambulatory Visit | Attending: Hematology & Oncology | Admitting: Hematology & Oncology

## 2024-01-17 ENCOUNTER — Telehealth: Payer: Self-pay | Admitting: *Deleted

## 2024-01-17 ENCOUNTER — Ambulatory Visit
Admission: RE | Admit: 2024-01-17 | Discharge: 2024-01-17 | Disposition: A | Discharge status: Home / Self Care | Source: Ambulatory Visit | Attending: Radiation Oncology | Admitting: Radiation Oncology

## 2024-01-17 VITALS — BP 161/94 | HR 79 | Temp 97.6°F | Resp 18 | Ht 63.0 in | Wt 100.6 lb

## 2024-01-17 DIAGNOSIS — C3491 Malignant neoplasm of unspecified part of right bronchus or lung: Secondary | ICD-10-CM

## 2024-01-17 DIAGNOSIS — R55 Syncope and collapse: Secondary | ICD-10-CM | POA: Diagnosis not present

## 2024-01-17 DIAGNOSIS — K760 Fatty (change of) liver, not elsewhere classified: Secondary | ICD-10-CM | POA: Diagnosis not present

## 2024-01-17 DIAGNOSIS — C3490 Malignant neoplasm of unspecified part of unspecified bronchus or lung: Secondary | ICD-10-CM

## 2024-01-17 DIAGNOSIS — Z800 Family history of malignant neoplasm of digestive organs: Secondary | ICD-10-CM | POA: Insufficient documentation

## 2024-01-17 DIAGNOSIS — C187 Malignant neoplasm of sigmoid colon: Secondary | ICD-10-CM

## 2024-01-17 DIAGNOSIS — I700 Atherosclerosis of aorta: Secondary | ICD-10-CM | POA: Insufficient documentation

## 2024-01-17 DIAGNOSIS — Z85038 Personal history of other malignant neoplasm of large intestine: Secondary | ICD-10-CM | POA: Insufficient documentation

## 2024-01-17 DIAGNOSIS — I083 Combined rheumatic disorders of mitral, aortic and tricuspid valves: Secondary | ICD-10-CM | POA: Diagnosis not present

## 2024-01-17 DIAGNOSIS — E785 Hyperlipidemia, unspecified: Secondary | ICD-10-CM | POA: Diagnosis not present

## 2024-01-17 DIAGNOSIS — C786 Secondary malignant neoplasm of retroperitoneum and peritoneum: Secondary | ICD-10-CM | POA: Insufficient documentation

## 2024-01-17 DIAGNOSIS — Z79899 Other long term (current) drug therapy: Secondary | ICD-10-CM | POA: Insufficient documentation

## 2024-01-17 DIAGNOSIS — C7951 Secondary malignant neoplasm of bone: Secondary | ICD-10-CM | POA: Diagnosis not present

## 2024-01-17 DIAGNOSIS — C3411 Malignant neoplasm of upper lobe, right bronchus or lung: Secondary | ICD-10-CM | POA: Insufficient documentation

## 2024-01-17 DIAGNOSIS — Z87891 Personal history of nicotine dependence: Secondary | ICD-10-CM | POA: Diagnosis not present

## 2024-01-17 DIAGNOSIS — K219 Gastro-esophageal reflux disease without esophagitis: Secondary | ICD-10-CM | POA: Insufficient documentation

## 2024-01-17 MED ORDER — DEXAMETHASONE 4 MG PO TABS: ORAL_TABLET | ORAL | 0 refills | Status: DC

## 2024-01-17 MED ORDER — OXYCODONE HCL 5 MG PO TABS
ORAL_TABLET | ORAL | 0 refills | Status: DC
Start: 2024-01-17 — End: 2024-01-28

## 2024-01-17 MED ORDER — NYSTATIN 100000 UNIT/ML MT SUSP: 5.0000 mL | Freq: Four times a day (QID) | OROMUCOSAL | 0 refills | Status: DC

## 2024-01-17 MED ORDER — GADOBUTROL 1 MMOL/ML IV SOLN
5.0000 mL | Freq: Once | INTRAVENOUS | Status: AC | PRN
  Administered 2024-01-17: 5 mL via INTRAVENOUS

## 2024-01-17 NOTE — Telephone Encounter (Signed)
 Call received from patient to inform Dr. Maria Shiner that the Tramadol  is no longer helping her back and leg pain and is requesting something stronger for pain.  Dr. Maria Shiner notified.  Call placed back to pt and pt notified per order of Dr. Maria Shiner that a prescription for Decadron , Nystatin and Oxycodone  would be sent in to her pharmacy.  Pt is appreciative of assistance and has no further questions at this time.

## 2024-01-18 ENCOUNTER — Ambulatory Visit
Admission: RE | Admit: 2024-01-18 | Discharge: 2024-01-18 | Disposition: A | Discharge status: Home / Self Care | Source: Ambulatory Visit | Attending: Radiation Oncology | Admitting: Radiation Oncology

## 2024-01-18 ENCOUNTER — Encounter: Payer: Self-pay | Admitting: *Deleted

## 2024-01-18 DIAGNOSIS — C3490 Malignant neoplasm of unspecified part of unspecified bronchus or lung: Secondary | ICD-10-CM | POA: Diagnosis not present

## 2024-01-18 DIAGNOSIS — C7951 Secondary malignant neoplasm of bone: Secondary | ICD-10-CM | POA: Insufficient documentation

## 2024-01-18 DIAGNOSIS — Z87891 Personal history of nicotine dependence: Secondary | ICD-10-CM | POA: Diagnosis not present

## 2024-01-18 DIAGNOSIS — Z510 Encounter for antineoplastic radiation therapy: Secondary | ICD-10-CM | POA: Diagnosis not present

## 2024-01-18 DIAGNOSIS — C3491 Malignant neoplasm of unspecified part of right bronchus or lung: Secondary | ICD-10-CM | POA: Diagnosis not present

## 2024-01-18 DIAGNOSIS — C3411 Malignant neoplasm of upper lobe, right bronchus or lung: Secondary | ICD-10-CM | POA: Diagnosis not present

## 2024-01-18 NOTE — Progress Notes (Signed)
 Patient was seen by RadOnc and plan for radiation made. Still awaiting results of brain MRI.   Oncology Nurse Navigator Documentation     01/18/2024   10:45 AM  Oncology Nurse Navigator Flowsheets  Planned Course of Treatment Radiation  Phase of Treatment Radiation  Radiation Actual Start Date: 01/20/2024  Radiation Expected End Date: 02/10/2024  Navigator Follow Up Date: 01/28/2024  Navigator Follow Up Reason: Follow-up Appointment  Navigator Location [facility]  Navigator Encounter Type Appt/Treatment Plan Review  Patient Visit Type MedOnc  Treatment Phase Pre-Tx/Tx Discussion  Barriers/Navigation Needs Coordination of Care  Interventions None Required  Acuity Level 2-Minimal Needs (1-2 Barriers Identified)  Support Groups/Services Friends and Family  Time Spent with Patient 15

## 2024-01-19 ENCOUNTER — Encounter: Payer: Self-pay | Admitting: *Deleted

## 2024-01-19 ENCOUNTER — Ambulatory Visit: Payer: Self-pay | Admitting: Hematology & Oncology

## 2024-01-19 DIAGNOSIS — Z87891 Personal history of nicotine dependence: Secondary | ICD-10-CM | POA: Diagnosis not present

## 2024-01-19 DIAGNOSIS — Z510 Encounter for antineoplastic radiation therapy: Secondary | ICD-10-CM | POA: Diagnosis not present

## 2024-01-19 DIAGNOSIS — C3491 Malignant neoplasm of unspecified part of right bronchus or lung: Secondary | ICD-10-CM | POA: Diagnosis not present

## 2024-01-19 DIAGNOSIS — C7951 Secondary malignant neoplasm of bone: Secondary | ICD-10-CM | POA: Diagnosis not present

## 2024-01-19 DIAGNOSIS — C3411 Malignant neoplasm of upper lobe, right bronchus or lung: Secondary | ICD-10-CM | POA: Diagnosis not present

## 2024-01-19 NOTE — Progress Notes (Signed)
 Reviewed MRI which was negative for any mets.  Received a call from the patient's husband, Regina Young. The pain in her leg has become unbearable. He states she "screams and hollers" with any activity. None of the pain management she has at home is working. She starts radiation tomorrow and he's not sure she will be able to go. She has Ultram  and Oxycodone  5mg -10mg  q6h currently.   Spoke with Dr Maria Shiner. He feels that patient should go to the ED to be evaluated for possible admission. Regina Young doesn't want to take her to the hospital but rather manage the pain at home. I later spoke with the patient who said the same thing. They would continue to take the oxy 10mg  every 6h and add tylenol  to the dosing. Regina Young did agree that if the pain became unbearable she would seek out care in the ED.   Patient also notified of results of MRI per Dr Birt Bulla message:   Please call and let her know that the MRI of the brain looks fine without any obvious cancer.  [facility]   Oncology Nurse Navigator Documentation     01/19/2024    9:00 AM  Oncology Nurse Navigator Flowsheets  Navigator Follow Up Date: 01/28/2024  Navigator Follow Up Reason: Follow-up Appointment  Navigator Location [facility]  Navigator Encounter Type Scan Review;Telephone  Telephone Incoming Call  Patient Visit Type MedOnc  Treatment Phase Pre-Tx/Tx Discussion  Barriers/Navigation Needs Coordination of Care;Education  Education Pain/ Symptom Management  Interventions Education;Psycho-Social Support  Acuity Level 2-Minimal Needs (1-2 Barriers Identified)  Education Method Verbal  Support Groups/Services Friends and Family  Time Spent with Patient 30

## 2024-01-20 ENCOUNTER — Ambulatory Visit
Admission: RE | Admit: 2024-01-20 | Discharge: 2024-01-20 | Disposition: A | Discharge status: Home / Self Care | Source: Ambulatory Visit | Attending: Radiation Oncology | Admitting: Radiation Oncology

## 2024-01-20 ENCOUNTER — Telehealth: Payer: Self-pay

## 2024-01-20 ENCOUNTER — Other Ambulatory Visit: Payer: Self-pay

## 2024-01-20 ENCOUNTER — Other Ambulatory Visit: Payer: Self-pay | Admitting: Radiation Oncology

## 2024-01-20 DIAGNOSIS — C3411 Malignant neoplasm of upper lobe, right bronchus or lung: Secondary | ICD-10-CM | POA: Diagnosis not present

## 2024-01-20 DIAGNOSIS — C3491 Malignant neoplasm of unspecified part of right bronchus or lung: Secondary | ICD-10-CM | POA: Diagnosis not present

## 2024-01-20 DIAGNOSIS — Z510 Encounter for antineoplastic radiation therapy: Secondary | ICD-10-CM | POA: Diagnosis not present

## 2024-01-20 DIAGNOSIS — Z87891 Personal history of nicotine dependence: Secondary | ICD-10-CM | POA: Diagnosis not present

## 2024-01-20 DIAGNOSIS — C7951 Secondary malignant neoplasm of bone: Secondary | ICD-10-CM | POA: Diagnosis not present

## 2024-01-20 LAB — RAD ONC ARIA SESSION SUMMARY
Course Elapsed Days: 0
Plan Fractions Treated to Date: 1
Plan Prescribed Dose Per Fraction: 3 Gy
Plan Total Fractions Prescribed: 10
Plan Total Prescribed Dose: 30 Gy
Reference Point Dosage Given to Date: 3 Gy
Reference Point Session Dosage Given: 3 Gy
Session Number: 1

## 2024-01-20 MED ORDER — MORPHINE SULFATE ER 15 MG PO TBCR: 15.0000 mg | EXTENDED_RELEASE_TABLET | Freq: Two times a day (BID) | ORAL | 0 refills | Status: DC

## 2024-01-20 NOTE — Telephone Encounter (Signed)
 Patient called in to report severe pain to right leg and foot. Patient reports no relief with oxycodone , gabapentin  or dexamethasone . Patient denies any swelling to right leg or foot, states leg is pulsating. Patient does want to come in for her first radiation treatment today. Advised patient to come in to see Dr. Eloise Hake prior to first treatment, if pain worsens go to [facility] emergency room for evaluation. Patient voiced understanding.

## 2024-01-21 ENCOUNTER — Ambulatory Visit
Admission: RE | Admit: 2024-01-21 | Discharge: 2024-01-21 | Disposition: A | Discharge status: Home / Self Care | Source: Ambulatory Visit | Attending: Radiation Oncology | Admitting: Radiation Oncology

## 2024-01-21 ENCOUNTER — Other Ambulatory Visit: Payer: Self-pay

## 2024-01-21 DIAGNOSIS — C7951 Secondary malignant neoplasm of bone: Secondary | ICD-10-CM | POA: Diagnosis not present

## 2024-01-21 DIAGNOSIS — Z87891 Personal history of nicotine dependence: Secondary | ICD-10-CM | POA: Diagnosis not present

## 2024-01-21 DIAGNOSIS — Z510 Encounter for antineoplastic radiation therapy: Secondary | ICD-10-CM | POA: Diagnosis not present

## 2024-01-21 DIAGNOSIS — C3491 Malignant neoplasm of unspecified part of right bronchus or lung: Secondary | ICD-10-CM | POA: Diagnosis not present

## 2024-01-21 DIAGNOSIS — C3411 Malignant neoplasm of upper lobe, right bronchus or lung: Secondary | ICD-10-CM | POA: Diagnosis not present

## 2024-01-21 LAB — RAD ONC ARIA SESSION SUMMARY
Course Elapsed Days: 1
Plan Fractions Treated to Date: 2
Plan Prescribed Dose Per Fraction: 3 Gy
Plan Total Fractions Prescribed: 10
Plan Total Prescribed Dose: 30 Gy
Reference Point Dosage Given to Date: 6 Gy
Reference Point Session Dosage Given: 3 Gy
Session Number: 2

## 2024-01-21 NOTE — Progress Notes (Signed)
 Morphine Sulfate ER 15 mg tablets is approved through 01/20/2025. Patient aware. No other needs or concerns noted at this time.

## 2024-01-24 ENCOUNTER — Ambulatory Visit
Admission: RE | Admit: 2024-01-24 | Discharge: 2024-01-24 | Disposition: A | Discharge status: Home / Self Care | Source: Ambulatory Visit | Attending: Radiation Oncology | Admitting: Radiation Oncology

## 2024-01-24 ENCOUNTER — Other Ambulatory Visit: Payer: Self-pay

## 2024-01-24 DIAGNOSIS — C3411 Malignant neoplasm of upper lobe, right bronchus or lung: Secondary | ICD-10-CM | POA: Diagnosis not present

## 2024-01-24 DIAGNOSIS — C7951 Secondary malignant neoplasm of bone: Secondary | ICD-10-CM | POA: Diagnosis not present

## 2024-01-24 DIAGNOSIS — Z87891 Personal history of nicotine dependence: Secondary | ICD-10-CM | POA: Diagnosis not present

## 2024-01-24 DIAGNOSIS — C3491 Malignant neoplasm of unspecified part of right bronchus or lung: Secondary | ICD-10-CM | POA: Diagnosis not present

## 2024-01-24 DIAGNOSIS — Z510 Encounter for antineoplastic radiation therapy: Secondary | ICD-10-CM | POA: Diagnosis not present

## 2024-01-24 LAB — RAD ONC ARIA SESSION SUMMARY
Course Elapsed Days: 4
Plan Fractions Treated to Date: 3
Plan Prescribed Dose Per Fraction: 3 Gy
Plan Total Fractions Prescribed: 10
Plan Total Prescribed Dose: 30 Gy
Reference Point Dosage Given to Date: 9 Gy
Reference Point Session Dosage Given: 3 Gy
Session Number: 3

## 2024-01-25 ENCOUNTER — Other Ambulatory Visit: Payer: Self-pay

## 2024-01-25 ENCOUNTER — Ambulatory Visit
Admission: RE | Admit: 2024-01-25 | Discharge: 2024-01-25 | Disposition: A | Discharge status: Home / Self Care | Source: Ambulatory Visit | Attending: Radiation Oncology | Admitting: Radiation Oncology

## 2024-01-25 DIAGNOSIS — Z87891 Personal history of nicotine dependence: Secondary | ICD-10-CM | POA: Diagnosis not present

## 2024-01-25 DIAGNOSIS — Z510 Encounter for antineoplastic radiation therapy: Secondary | ICD-10-CM | POA: Diagnosis not present

## 2024-01-25 DIAGNOSIS — C3491 Malignant neoplasm of unspecified part of right bronchus or lung: Secondary | ICD-10-CM | POA: Diagnosis not present

## 2024-01-25 DIAGNOSIS — C3411 Malignant neoplasm of upper lobe, right bronchus or lung: Secondary | ICD-10-CM | POA: Diagnosis not present

## 2024-01-25 DIAGNOSIS — C7951 Secondary malignant neoplasm of bone: Secondary | ICD-10-CM | POA: Diagnosis not present

## 2024-01-25 LAB — RAD ONC ARIA SESSION SUMMARY
Course Elapsed Days: 5
Plan Fractions Treated to Date: 4
Plan Prescribed Dose Per Fraction: 3 Gy
Plan Total Fractions Prescribed: 10
Plan Total Prescribed Dose: 30 Gy
Reference Point Dosage Given to Date: 12 Gy
Reference Point Session Dosage Given: 3 Gy
Session Number: 4

## 2024-01-26 ENCOUNTER — Other Ambulatory Visit: Payer: Self-pay

## 2024-01-26 ENCOUNTER — Ambulatory Visit

## 2024-01-26 ENCOUNTER — Ambulatory Visit
Admission: RE | Admit: 2024-01-26 | Discharge: 2024-01-26 | Disposition: A | Discharge status: Home / Self Care | Source: Ambulatory Visit | Attending: Radiation Oncology | Admitting: Radiation Oncology

## 2024-01-26 DIAGNOSIS — Z87891 Personal history of nicotine dependence: Secondary | ICD-10-CM | POA: Diagnosis not present

## 2024-01-26 DIAGNOSIS — C3491 Malignant neoplasm of unspecified part of right bronchus or lung: Secondary | ICD-10-CM | POA: Diagnosis not present

## 2024-01-26 DIAGNOSIS — Z510 Encounter for antineoplastic radiation therapy: Secondary | ICD-10-CM | POA: Diagnosis not present

## 2024-01-26 DIAGNOSIS — C3411 Malignant neoplasm of upper lobe, right bronchus or lung: Secondary | ICD-10-CM | POA: Diagnosis not present

## 2024-01-26 DIAGNOSIS — C7951 Secondary malignant neoplasm of bone: Secondary | ICD-10-CM | POA: Diagnosis not present

## 2024-01-26 LAB — RAD ONC ARIA SESSION SUMMARY
Course Elapsed Days: 6
Plan Fractions Treated to Date: 5
Plan Prescribed Dose Per Fraction: 3 Gy
Plan Total Fractions Prescribed: 10
Plan Total Prescribed Dose: 30 Gy
Reference Point Dosage Given to Date: 15 Gy
Reference Point Session Dosage Given: 3 Gy
Session Number: 5

## 2024-01-27 ENCOUNTER — Ambulatory Visit
Admission: RE | Admit: 2024-01-27 | Discharge: 2024-01-27 | Disposition: A | Discharge status: Home / Self Care | Source: Ambulatory Visit | Attending: Radiation Oncology | Admitting: Radiation Oncology

## 2024-01-27 ENCOUNTER — Other Ambulatory Visit: Payer: Self-pay

## 2024-01-27 DIAGNOSIS — C3411 Malignant neoplasm of upper lobe, right bronchus or lung: Secondary | ICD-10-CM | POA: Diagnosis not present

## 2024-01-27 DIAGNOSIS — C7951 Secondary malignant neoplasm of bone: Secondary | ICD-10-CM | POA: Diagnosis not present

## 2024-01-27 DIAGNOSIS — Z510 Encounter for antineoplastic radiation therapy: Secondary | ICD-10-CM | POA: Diagnosis not present

## 2024-01-27 DIAGNOSIS — Z87891 Personal history of nicotine dependence: Secondary | ICD-10-CM | POA: Diagnosis not present

## 2024-01-27 DIAGNOSIS — C3491 Malignant neoplasm of unspecified part of right bronchus or lung: Secondary | ICD-10-CM | POA: Diagnosis not present

## 2024-01-27 LAB — RAD ONC ARIA SESSION SUMMARY
Course Elapsed Days: 7
Plan Fractions Treated to Date: 1
Plan Fractions Treated to Date: 6
Plan Prescribed Dose Per Fraction: 3 Gy
Plan Prescribed Dose Per Fraction: 5 Gy
Plan Total Fractions Prescribed: 10
Plan Total Fractions Prescribed: 10
Plan Total Prescribed Dose: 30 Gy
Plan Total Prescribed Dose: 50 Gy
Reference Point Dosage Given to Date: 18 Gy
Reference Point Dosage Given to Date: 5 Gy
Reference Point Session Dosage Given: 3 Gy
Reference Point Session Dosage Given: 5 Gy
Session Number: 6

## 2024-01-28 ENCOUNTER — Encounter: Payer: Self-pay | Admitting: Hematology & Oncology

## 2024-01-28 ENCOUNTER — Inpatient Hospital Stay

## 2024-01-28 ENCOUNTER — Encounter: Payer: Self-pay | Admitting: *Deleted

## 2024-01-28 ENCOUNTER — Other Ambulatory Visit: Payer: Self-pay

## 2024-01-28 ENCOUNTER — Ambulatory Visit
Admission: RE | Admit: 2024-01-28 | Discharge: 2024-01-28 | Disposition: A | Discharge status: Home / Self Care | Source: Ambulatory Visit | Attending: Radiation Oncology | Admitting: Radiation Oncology

## 2024-01-28 ENCOUNTER — Inpatient Hospital Stay (HOSPITAL_BASED_OUTPATIENT_CLINIC_OR_DEPARTMENT_OTHER): Admitting: Hematology & Oncology

## 2024-01-28 DIAGNOSIS — C786 Secondary malignant neoplasm of retroperitoneum and peritoneum: Secondary | ICD-10-CM

## 2024-01-28 DIAGNOSIS — Z510 Encounter for antineoplastic radiation therapy: Secondary | ICD-10-CM | POA: Diagnosis not present

## 2024-01-28 DIAGNOSIS — C3490 Malignant neoplasm of unspecified part of unspecified bronchus or lung: Secondary | ICD-10-CM

## 2024-01-28 DIAGNOSIS — M79604 Pain in right leg: Secondary | ICD-10-CM | POA: Diagnosis not present

## 2024-01-28 DIAGNOSIS — C3411 Malignant neoplasm of upper lobe, right bronchus or lung: Secondary | ICD-10-CM | POA: Diagnosis not present

## 2024-01-28 DIAGNOSIS — Z87891 Personal history of nicotine dependence: Secondary | ICD-10-CM | POA: Diagnosis not present

## 2024-01-28 DIAGNOSIS — C3491 Malignant neoplasm of unspecified part of right bronchus or lung: Secondary | ICD-10-CM | POA: Diagnosis not present

## 2024-01-28 DIAGNOSIS — Z79899 Other long term (current) drug therapy: Secondary | ICD-10-CM | POA: Diagnosis not present

## 2024-01-28 DIAGNOSIS — C187 Malignant neoplasm of sigmoid colon: Secondary | ICD-10-CM

## 2024-01-28 DIAGNOSIS — M549 Dorsalgia, unspecified: Secondary | ICD-10-CM | POA: Diagnosis not present

## 2024-01-28 DIAGNOSIS — N1330 Unspecified hydronephrosis: Secondary | ICD-10-CM | POA: Diagnosis not present

## 2024-01-28 DIAGNOSIS — Z85038 Personal history of other malignant neoplasm of large intestine: Secondary | ICD-10-CM | POA: Diagnosis not present

## 2024-01-28 DIAGNOSIS — C7951 Secondary malignant neoplasm of bone: Secondary | ICD-10-CM | POA: Diagnosis not present

## 2024-01-28 DIAGNOSIS — M899 Disorder of bone, unspecified: Secondary | ICD-10-CM | POA: Diagnosis not present

## 2024-01-28 LAB — CBC WITH DIFFERENTIAL (CANCER CENTER ONLY)
Abs Immature Granulocytes: 0.66 10*3/uL — ABNORMAL HIGH (ref 0.00–0.07)
Basophils Absolute: 0.1 10*3/uL (ref 0.0–0.1)
Basophils Relative: 1 %
Eosinophils Absolute: 0 10*3/uL (ref 0.0–0.5)
Eosinophils Relative: 0 %
HCT: 36.6 % (ref 36.0–46.0)
Hemoglobin: 11.6 g/dL — ABNORMAL LOW (ref 12.0–15.0)
Immature Granulocytes: 5 %
Lymphocytes Relative: 8 %
Lymphs Abs: 1.1 10*3/uL (ref 0.7–4.0)
MCH: 29.2 pg (ref 26.0–34.0)
MCHC: 31.7 g/dL (ref 30.0–36.0)
MCV: 92.2 fL (ref 80.0–100.0)
Monocytes Absolute: 1.2 10*3/uL — ABNORMAL HIGH (ref 0.1–1.0)
Monocytes Relative: 9 %
Neutro Abs: 10.4 10*3/uL — ABNORMAL HIGH (ref 1.7–7.7)
Neutrophils Relative %: 77 %
Platelet Count: 380 10*3/uL (ref 150–400)
RBC: 3.97 MIL/uL (ref 3.87–5.11)
RDW: 13.7 % (ref 11.5–15.5)
WBC Count: 13.5 10*3/uL — ABNORMAL HIGH (ref 4.0–10.5)
nRBC: 0 % (ref 0.0–0.2)

## 2024-01-28 LAB — CMP (CANCER CENTER ONLY)
ALT: 53 U/L — ABNORMAL HIGH (ref 0–44)
AST: 20 U/L (ref 15–41)
Albumin: 4.1 g/dL (ref 3.5–5.0)
Alkaline Phosphatase: 91 U/L (ref 38–126)
Anion gap: 7 (ref 5–15)
BUN: 29 mg/dL — ABNORMAL HIGH (ref 8–23)
CO2: 32 mmol/L (ref 22–32)
Calcium: 9.4 mg/dL (ref 8.9–10.3)
Chloride: 102 mmol/L (ref 98–111)
Creatinine: 0.89 mg/dL (ref 0.44–1.00)
GFR, Estimated: >60 mL/min (ref >60)
Glucose, Bld: 87 mg/dL (ref 70–99)
Potassium: 4.7 mmol/L (ref 3.5–5.1)
Sodium: 141 mmol/L (ref 135–145)
Total Bilirubin: 0.3 mg/dL (ref 0.0–1.2)
Total Protein: 6.2 g/dL — ABNORMAL LOW (ref 6.5–8.1)

## 2024-01-28 LAB — RAD ONC ARIA SESSION SUMMARY
Course Elapsed Days: 8
Plan Fractions Treated to Date: 2
Plan Fractions Treated to Date: 7
Plan Prescribed Dose Per Fraction: 3 Gy
Plan Prescribed Dose Per Fraction: 5 Gy
Plan Total Fractions Prescribed: 10
Plan Total Fractions Prescribed: 10
Plan Total Prescribed Dose: 30 Gy
Plan Total Prescribed Dose: 50 Gy
Reference Point Dosage Given to Date: 10 Gy
Reference Point Dosage Given to Date: 21 Gy
Reference Point Session Dosage Given: 3 Gy
Reference Point Session Dosage Given: 5 Gy
Session Number: 7

## 2024-01-28 LAB — LACTATE DEHYDROGENASE: LDH: 184 U/L (ref 98–192)

## 2024-01-28 MED ORDER — MORPHINE SULFATE ER 15 MG PO TBCR: 15.0000 mg | EXTENDED_RELEASE_TABLET | Freq: Two times a day (BID) | ORAL | 0 refills | Status: DC

## 2024-01-28 MED ORDER — OXYCODONE HCL 5 MG PO TABS: ORAL_TABLET | ORAL | 0 refills | Status: DC

## 2024-01-28 NOTE — Progress Notes (Signed)
 Patient is here for follow up. She continues on radiation. She should finish 02/10/2024. We will continue to hold on any treatment plans until after patient finishes radiation.   Oncology Nurse Navigator Documentation     01/28/2024    9:30 AM  Oncology Nurse Navigator Flowsheets  Navigator Follow Up Date: 02/18/2024  Navigator Follow Up Reason: Follow-up Appointment  Navigator Location [facility]  Navigator Encounter Type Follow-up Appt  Patient Visit Type MedOnc  Treatment Phase Active Tx  Barriers/Navigation Needs Coordination of Care;Education  Interventions Psycho-Social Support  Acuity Level 2-Minimal Needs (1-2 Barriers Identified)  Support Groups/Services Friends and Family  Time Spent with Patient 15

## 2024-01-28 NOTE — Progress Notes (Signed)
 Hematology and Oncology Follow Up Visit  Regina Young [id number] 04/03/1956 68 y.o. 01/28/2024   Principle Diagnosis:  Stage IV adenocarcinoma of the right lung-spinal/sacral metastasis -- NO actionable mutations/ BRCA2(+) Stage I (T2N0M0) colonic adenocarcinoma - 02/2021  Current Therapy:   Radiotherapy to the lung primary and to the sacral metastasis Xgeva 120 mg subcu every 3 months-next dose 02/2024     Interim History:  Regina Young is in for follow-up.  She has started radiotherapy.  She so far has done well with this.  1 partly is the fact that her pain is a whole lot better.  She is on MS Contin  twice a day.  She takes oxycodone  as needed.  I am so happy that her pain is doing much better.  She still has the radicular pain down the right leg.  Again, she is getting radiotherapy.  I know that Radiation Oncology would do a great job in treating her.  We will certainly have to think about systemic therapy for her.  Unfortunately, they were going to have to utilize chemotherapy with immunotherapy.  I would probably do this after she finishes her radiotherapy.  She has had no problems with bowels or bladder.  She has had no cough or shortness of breath.  She has had no headache.  Of note, we did do an MRI of the brain.  This was done on 01/17/2024.  This did not show any evidence of CNS metastasis.  Currently, I would say that her performance status is probably ECOG 1.   Medications:  Current Outpatient Medications:    alendronate (FOSAMAX) 70 MG tablet, Take 70 mg by mouth once a week. 1 tablet 30 minutes before the first food, beverage or medicine of the day with plain water Orally once weekly for 84 days, Disp: , Rfl:    Calcium  Citrate 250 MG TABS, Take 1 tablet by mouth in the morning., Disp: , Rfl:    Cholecalciferol 50 MCG (2000 UT) CAPS, Take 1 capsule by mouth daily., Disp: , Rfl:    cyanocobalamin  (VITAMIN B12) 1000 MCG tablet, Take 1,000 mcg by mouth daily., Disp: , Rfl:     dexamethasone  (DECADRON ) 4 MG tablet, Take Decadron  20 mg (five tablets) today and then take Decadron  12 mg (three tablets) daily.  Take in the AM with food., Disp: 100 tablet, Rfl: 0   gabapentin  (NEURONTIN ) 300 MG capsule, Take 1 capsule (300 mg total) by mouth 3 (three) times daily., Disp: 90 capsule, Rfl: 1   morphine  (MS CONTIN ) 15 MG 12 hr tablet, Take 1 tablet (15 mg total) by mouth every 12 (twelve) hours., Disp: 30 tablet, Rfl: 0   nystatin  (MYCOSTATIN ) 100000 UNIT/ML suspension, Take 5 mLs (500,000 Units total) by mouth 4 (four) times daily., Disp: 60 mL, Rfl: 0   omeprazole 20 MG TBDD disintegrating tablet, Take 20 mg by mouth daily., Disp: , Rfl:    oxyCODONE  (OXY IR/ROXICODONE ) 5 MG immediate release tablet, Take one to two tablets every six hours as needed for pain, Disp: 60 tablet, Rfl: 0   rosuvastatin  (CRESTOR ) 5 MG tablet, Take 5 mg by mouth in the morning. (0800), Disp: , Rfl:    acetaminophen  (TYLENOL ) 500 MG tablet, Take 500-1,000 mg by mouth every 6 (six) hours as needed (PAIN). (Patient not taking: Reported on 01/28/2024), Disp: , Rfl:   Allergies: No Known Allergies  Past Medical History, Surgical history, Social history, and Family History were reviewed and updated.  Review of Systems: Review of Systems  Constitutional: Negative.   HENT:  Negative.    Eyes: Negative.   Respiratory: Negative.    Cardiovascular: Negative.   Gastrointestinal:  Positive for abdominal pain.  Endocrine: Negative.   Genitourinary: Negative.    Musculoskeletal:  Positive for back pain.  Neurological: Negative.   Hematological: Negative.   Psychiatric/Behavioral: Negative.      Physical Exam:  height is 5\' 3"  (1.6 m) and weight is 106 lb 1.3 oz (48.1 kg). Her oral temperature is 98.4 F (36.9 C). Her blood pressure is 141/70 (abnormal) and her pulse is 83. Her respiration is 20 and oxygen saturation is 98%.   Wt Readings from Last 3 Encounters:  01/28/24 106 lb 1.3 oz (48.1 kg)   01/17/24 100 lb 9.6 oz (45.6 kg)  01/11/24 104 lb 3.2 oz (47.3 kg)    Physical Exam Vitals reviewed.  HENT:     Head: Normocephalic and atraumatic.  Eyes:     Pupils: Pupils are equal, round, and reactive to light.  Cardiovascular:     Rate and Rhythm: Normal rate and regular rhythm.     Heart sounds: Normal heart sounds.  Pulmonary:     Effort: Pulmonary effort is normal.     Breath sounds: Normal breath sounds.  Abdominal:     General: Bowel sounds are normal.     Palpations: Abdomen is soft.  Musculoskeletal:        General: No tenderness or deformity. Normal range of motion.     Cervical back: Normal range of motion.  Lymphadenopathy:     Cervical: No cervical adenopathy.  Skin:    General: Skin is warm and dry.     Findings: No erythema or rash.  Neurological:     Mental Status: She is alert and oriented to person, place, and time.  Psychiatric:        Behavior: Behavior normal.        Thought Content: Thought content normal.        Judgment: Judgment normal.     Lab Results  Component Value Date   WBC 13.5 (H) 01/28/2024   HGB 11.6 (L) 01/28/2024   HCT 36.6 01/28/2024   MCV 92.2 01/28/2024   PLT 380 01/28/2024     Chemistry      Component Value Date/Time   NA 140 01/10/2024 1125   K 4.7 01/10/2024 1125   CL 103 01/10/2024 1125   CO2 30 01/10/2024 1125   BUN 23 01/10/2024 1125   CREATININE 0.95 01/10/2024 1125      Component Value Date/Time   CALCIUM  9.9 01/10/2024 1125   ALKPHOS 73 01/10/2024 1125   AST 13 (L) 01/10/2024 1125   ALT 12 01/10/2024 1125   BILITOT 0.4 01/10/2024 1125      Impression and Plan: Regina Young is a very charming 68 year old white female.  She has a past history of an early stage colon cancer.  However, now it looks like she has a metastatic non-small cell lung cancer-adenocarcinoma.  She will continue her radiotherapy right now.  Hopefully this will help with her pain.  It should also help with decreasing her tumor  volume..  She really only has oligometastatic disease.  I think she only has 1 area of metastasis.  However, this is in a very difficult spot that cannot be treated with surgery.  Again we will consider her for systemic therapy once she completes her radiotherapy.  When we see her back, we will talk about the different options that we  may have.  She knows that she can always give us  a call if she has any problems with respect to pain.   Ivor Mars, MD 5/23/20258:59 AM

## 2024-02-01 ENCOUNTER — Ambulatory Visit
Admission: RE | Admit: 2024-02-01 | Discharge: 2024-02-01 | Disposition: A | Discharge status: Home / Self Care | Source: Ambulatory Visit | Attending: Radiation Oncology | Admitting: Radiation Oncology

## 2024-02-01 ENCOUNTER — Other Ambulatory Visit: Payer: Self-pay

## 2024-02-01 ENCOUNTER — Other Ambulatory Visit: Payer: Self-pay | Admitting: Medical Oncology

## 2024-02-01 ENCOUNTER — Ambulatory Visit
Admission: RE | Admit: 2024-02-01 | Discharge: 2024-02-01 | Discharge status: Home / Self Care | Source: Ambulatory Visit | Attending: Radiation Oncology | Admitting: Radiation Oncology

## 2024-02-01 ENCOUNTER — Ambulatory Visit (HOSPITAL_BASED_OUTPATIENT_CLINIC_OR_DEPARTMENT_OTHER)
Admission: RE | Admit: 2024-02-01 | Discharge: 2024-02-01 | Disposition: A | Discharge status: Home / Self Care | Source: Ambulatory Visit | Attending: Vascular Surgery | Admitting: Vascular Surgery

## 2024-02-01 ENCOUNTER — Encounter: Payer: Self-pay | Admitting: Hematology & Oncology

## 2024-02-01 ENCOUNTER — Telehealth: Payer: Self-pay

## 2024-02-01 DIAGNOSIS — R1900 Intra-abdominal and pelvic swelling, mass and lump, unspecified site: Secondary | ICD-10-CM

## 2024-02-01 DIAGNOSIS — C7951 Secondary malignant neoplasm of bone: Secondary | ICD-10-CM | POA: Diagnosis not present

## 2024-02-01 DIAGNOSIS — Z510 Encounter for antineoplastic radiation therapy: Secondary | ICD-10-CM | POA: Diagnosis not present

## 2024-02-01 DIAGNOSIS — R609 Edema, unspecified: Secondary | ICD-10-CM

## 2024-02-01 DIAGNOSIS — C187 Malignant neoplasm of sigmoid colon: Secondary | ICD-10-CM

## 2024-02-01 DIAGNOSIS — K7689 Other specified diseases of liver: Secondary | ICD-10-CM

## 2024-02-01 DIAGNOSIS — D734 Cyst of spleen: Secondary | ICD-10-CM

## 2024-02-01 DIAGNOSIS — R590 Localized enlarged lymph nodes: Secondary | ICD-10-CM

## 2024-02-01 DIAGNOSIS — C3411 Malignant neoplasm of upper lobe, right bronchus or lung: Secondary | ICD-10-CM | POA: Diagnosis not present

## 2024-02-01 DIAGNOSIS — C786 Secondary malignant neoplasm of retroperitoneum and peritoneum: Secondary | ICD-10-CM

## 2024-02-01 DIAGNOSIS — Z85038 Personal history of other malignant neoplasm of large intestine: Secondary | ICD-10-CM

## 2024-02-01 DIAGNOSIS — N1330 Unspecified hydronephrosis: Secondary | ICD-10-CM

## 2024-02-01 DIAGNOSIS — Z87891 Personal history of nicotine dependence: Secondary | ICD-10-CM | POA: Diagnosis not present

## 2024-02-01 DIAGNOSIS — C3491 Malignant neoplasm of unspecified part of right bronchus or lung: Secondary | ICD-10-CM | POA: Diagnosis not present

## 2024-02-01 DIAGNOSIS — N134 Hydroureter: Secondary | ICD-10-CM

## 2024-02-01 DIAGNOSIS — R55 Syncope and collapse: Secondary | ICD-10-CM

## 2024-02-01 DIAGNOSIS — Z90710 Acquired absence of both cervix and uterus: Secondary | ICD-10-CM

## 2024-02-01 LAB — RAD ONC ARIA SESSION SUMMARY
Course Elapsed Days: 12
Plan Fractions Treated to Date: 3
Plan Fractions Treated to Date: 8
Plan Prescribed Dose Per Fraction: 3 Gy
Plan Prescribed Dose Per Fraction: 5 Gy
Plan Total Fractions Prescribed: 10
Plan Total Fractions Prescribed: 10
Plan Total Prescribed Dose: 30 Gy
Plan Total Prescribed Dose: 50 Gy
Reference Point Dosage Given to Date: 15 Gy
Reference Point Dosage Given to Date: 24 Gy
Reference Point Session Dosage Given: 3 Gy
Reference Point Session Dosage Given: 5 Gy
Session Number: 8

## 2024-02-01 MED ORDER — APIXABAN 5 MG PO TABS: ORAL_TABLET | ORAL | 0 refills | Status: DC

## 2024-02-01 NOTE — Progress Notes (Signed)
 Spoke with patient regarding her positive DVT result at the request of Dr. Eloise Hake. She is not having any chest pain or SOB.  She is agreeable to starting a blood thinner.  Discussed Eliquis which I have sent into the pharmacy for her. Discussed how to take this medication along with risks/benefits and red flags. Discussed how this medication works and monitoring.  She will follow up in 1 month with APP, labs

## 2024-02-01 NOTE — Telephone Encounter (Signed)
 Received a call from [facility] to report patient was positive for DVT to right posterior tibial vein. Dr. Eloise Hake made aware. Reached out to Dr. Birt Bulla office to make aware.

## 2024-02-02 ENCOUNTER — Encounter: Payer: Self-pay | Admitting: Hematology & Oncology

## 2024-02-02 ENCOUNTER — Ambulatory Visit
Admission: RE | Admit: 2024-02-02 | Discharge: 2024-02-02 | Disposition: A | Discharge status: Home / Self Care | Source: Ambulatory Visit | Attending: Radiation Oncology | Admitting: Radiation Oncology

## 2024-02-02 ENCOUNTER — Other Ambulatory Visit: Payer: Self-pay

## 2024-02-02 ENCOUNTER — Other Ambulatory Visit (HOSPITAL_BASED_OUTPATIENT_CLINIC_OR_DEPARTMENT_OTHER): Payer: Self-pay

## 2024-02-02 ENCOUNTER — Encounter: Payer: Self-pay | Admitting: *Deleted

## 2024-02-02 DIAGNOSIS — Z510 Encounter for antineoplastic radiation therapy: Secondary | ICD-10-CM | POA: Diagnosis not present

## 2024-02-02 DIAGNOSIS — C3411 Malignant neoplasm of upper lobe, right bronchus or lung: Secondary | ICD-10-CM | POA: Diagnosis not present

## 2024-02-02 DIAGNOSIS — C3491 Malignant neoplasm of unspecified part of right bronchus or lung: Secondary | ICD-10-CM | POA: Diagnosis not present

## 2024-02-02 DIAGNOSIS — C7951 Secondary malignant neoplasm of bone: Secondary | ICD-10-CM | POA: Diagnosis not present

## 2024-02-02 DIAGNOSIS — Z87891 Personal history of nicotine dependence: Secondary | ICD-10-CM | POA: Diagnosis not present

## 2024-02-02 LAB — RAD ONC ARIA SESSION SUMMARY
Course Elapsed Days: 13
Plan Fractions Treated to Date: 4
Plan Fractions Treated to Date: 9
Plan Prescribed Dose Per Fraction: 3 Gy
Plan Prescribed Dose Per Fraction: 5 Gy
Plan Total Fractions Prescribed: 10
Plan Total Fractions Prescribed: 10
Plan Total Prescribed Dose: 30 Gy
Plan Total Prescribed Dose: 50 Gy
Reference Point Dosage Given to Date: 20 Gy
Reference Point Dosage Given to Date: 27 Gy
Reference Point Session Dosage Given: 3 Gy
Reference Point Session Dosage Given: 5 Gy
Session Number: 9

## 2024-02-02 NOTE — Progress Notes (Signed)
 Patient was prescribed Elliquis yesterday after a doppler showed DVT. Patient called this morning to state that she was unable to pick up medication and needed us  to complete authorization. Our office did receive a request and auth request was sent to our team with an urgent request.   Called patient to notify her of plan and process. She had multiple questions about the medication which were answered to her satisfaction. She is aware that someone will call and notify her of auth outcome.   Oncology Nurse Navigator Documentation     02/02/2024    8:45 AM  Oncology Nurse Navigator Flowsheets  Navigator Follow Up Date: 02/18/2024  Navigator Follow Up Reason: Follow-up Appointment  Navigator Location [facility]  Navigator Encounter Type Telephone  Telephone Medication Assistance;Incoming Call  Patient Visit Type MedOnc  Treatment Phase Active Tx  Barriers/Navigation Needs Coordination of Care;Education  Education Other  Interventions Coordination of Care;Education  Acuity Level 2-Minimal Needs (1-2 Barriers Identified)  Coordination of Care Other  Education Method Verbal  Support Groups/Services Friends and Family  Time Spent with Patient 15

## 2024-02-03 ENCOUNTER — Other Ambulatory Visit: Payer: Self-pay

## 2024-02-03 ENCOUNTER — Ambulatory Visit
Admission: RE | Admit: 2024-02-03 | Discharge: 2024-02-03 | Disposition: A | Discharge status: Home / Self Care | Source: Ambulatory Visit | Attending: Radiation Oncology | Admitting: Radiation Oncology

## 2024-02-03 DIAGNOSIS — Z87891 Personal history of nicotine dependence: Secondary | ICD-10-CM | POA: Diagnosis not present

## 2024-02-03 DIAGNOSIS — C3411 Malignant neoplasm of upper lobe, right bronchus or lung: Secondary | ICD-10-CM | POA: Diagnosis not present

## 2024-02-03 DIAGNOSIS — C7951 Secondary malignant neoplasm of bone: Secondary | ICD-10-CM | POA: Diagnosis not present

## 2024-02-03 DIAGNOSIS — Z510 Encounter for antineoplastic radiation therapy: Secondary | ICD-10-CM | POA: Diagnosis not present

## 2024-02-03 DIAGNOSIS — C3491 Malignant neoplasm of unspecified part of right bronchus or lung: Secondary | ICD-10-CM | POA: Diagnosis not present

## 2024-02-03 LAB — RAD ONC ARIA SESSION SUMMARY
Course Elapsed Days: 14
Plan Fractions Treated to Date: 10
Plan Fractions Treated to Date: 5
Plan Prescribed Dose Per Fraction: 3 Gy
Plan Prescribed Dose Per Fraction: 5 Gy
Plan Total Fractions Prescribed: 10
Plan Total Fractions Prescribed: 10
Plan Total Prescribed Dose: 30 Gy
Plan Total Prescribed Dose: 50 Gy
Reference Point Dosage Given to Date: 25 Gy
Reference Point Dosage Given to Date: 30 Gy
Reference Point Session Dosage Given: 3 Gy
Reference Point Session Dosage Given: 5 Gy
Session Number: 10

## 2024-02-04 ENCOUNTER — Other Ambulatory Visit: Payer: Self-pay

## 2024-02-04 ENCOUNTER — Ambulatory Visit
Admission: RE | Admit: 2024-02-04 | Discharge: 2024-02-04 | Disposition: A | Discharge status: Home / Self Care | Source: Ambulatory Visit | Attending: Radiation Oncology | Admitting: Radiation Oncology

## 2024-02-04 ENCOUNTER — Ambulatory Visit: Admitting: Radiation Oncology

## 2024-02-04 DIAGNOSIS — Z87891 Personal history of nicotine dependence: Secondary | ICD-10-CM | POA: Diagnosis not present

## 2024-02-04 DIAGNOSIS — C3411 Malignant neoplasm of upper lobe, right bronchus or lung: Secondary | ICD-10-CM | POA: Diagnosis not present

## 2024-02-04 DIAGNOSIS — C7951 Secondary malignant neoplasm of bone: Secondary | ICD-10-CM | POA: Diagnosis not present

## 2024-02-04 DIAGNOSIS — C3491 Malignant neoplasm of unspecified part of right bronchus or lung: Secondary | ICD-10-CM | POA: Diagnosis not present

## 2024-02-04 DIAGNOSIS — Z510 Encounter for antineoplastic radiation therapy: Secondary | ICD-10-CM | POA: Diagnosis not present

## 2024-02-04 LAB — RAD ONC ARIA SESSION SUMMARY
Course Elapsed Days: 15
Plan Fractions Treated to Date: 6
Plan Prescribed Dose Per Fraction: 5 Gy
Plan Total Fractions Prescribed: 10
Plan Total Prescribed Dose: 50 Gy
Reference Point Dosage Given to Date: 30 Gy
Reference Point Session Dosage Given: 5 Gy
Session Number: 11

## 2024-02-07 ENCOUNTER — Ambulatory Visit
Admission: RE | Admit: 2024-02-07 | Discharge: 2024-02-07 | Disposition: A | Discharge status: Home / Self Care | Source: Ambulatory Visit | Attending: Radiation Oncology | Admitting: Radiation Oncology

## 2024-02-07 ENCOUNTER — Other Ambulatory Visit: Payer: Self-pay

## 2024-02-07 DIAGNOSIS — C7951 Secondary malignant neoplasm of bone: Secondary | ICD-10-CM | POA: Diagnosis not present

## 2024-02-07 DIAGNOSIS — C3491 Malignant neoplasm of unspecified part of right bronchus or lung: Secondary | ICD-10-CM | POA: Diagnosis not present

## 2024-02-07 DIAGNOSIS — Z87891 Personal history of nicotine dependence: Secondary | ICD-10-CM | POA: Insufficient documentation

## 2024-02-07 DIAGNOSIS — Z510 Encounter for antineoplastic radiation therapy: Secondary | ICD-10-CM | POA: Diagnosis not present

## 2024-02-07 DIAGNOSIS — C3411 Malignant neoplasm of upper lobe, right bronchus or lung: Secondary | ICD-10-CM | POA: Diagnosis not present

## 2024-02-07 LAB — RAD ONC ARIA SESSION SUMMARY
Course Elapsed Days: 18
Plan Fractions Treated to Date: 7
Plan Prescribed Dose Per Fraction: 5 Gy
Plan Total Fractions Prescribed: 10
Plan Total Prescribed Dose: 50 Gy
Reference Point Dosage Given to Date: 35 Gy
Reference Point Session Dosage Given: 5 Gy
Session Number: 12

## 2024-02-08 ENCOUNTER — Ambulatory Visit

## 2024-02-08 ENCOUNTER — Other Ambulatory Visit: Payer: Self-pay

## 2024-02-08 ENCOUNTER — Ambulatory Visit
Admission: RE | Admit: 2024-02-08 | Discharge: 2024-02-08 | Disposition: A | Discharge status: Home / Self Care | Source: Ambulatory Visit | Attending: Radiation Oncology | Admitting: Radiation Oncology

## 2024-02-08 DIAGNOSIS — C7951 Secondary malignant neoplasm of bone: Secondary | ICD-10-CM | POA: Diagnosis not present

## 2024-02-08 DIAGNOSIS — Z510 Encounter for antineoplastic radiation therapy: Secondary | ICD-10-CM | POA: Diagnosis not present

## 2024-02-08 DIAGNOSIS — C3491 Malignant neoplasm of unspecified part of right bronchus or lung: Secondary | ICD-10-CM | POA: Diagnosis not present

## 2024-02-08 DIAGNOSIS — Z87891 Personal history of nicotine dependence: Secondary | ICD-10-CM | POA: Diagnosis not present

## 2024-02-08 DIAGNOSIS — C3411 Malignant neoplasm of upper lobe, right bronchus or lung: Secondary | ICD-10-CM | POA: Diagnosis not present

## 2024-02-08 LAB — RAD ONC ARIA SESSION SUMMARY
Course Elapsed Days: 19
Plan Fractions Treated to Date: 8
Plan Prescribed Dose Per Fraction: 5 Gy
Plan Total Fractions Prescribed: 10
Plan Total Prescribed Dose: 50 Gy
Reference Point Dosage Given to Date: 40 Gy
Reference Point Session Dosage Given: 5 Gy
Session Number: 13

## 2024-02-09 ENCOUNTER — Ambulatory Visit
Admission: RE | Admit: 2024-02-09 | Discharge: 2024-02-09 | Disposition: A | Discharge status: Home / Self Care | Source: Ambulatory Visit | Attending: Radiation Oncology | Admitting: Radiation Oncology

## 2024-02-09 ENCOUNTER — Ambulatory Visit

## 2024-02-09 ENCOUNTER — Other Ambulatory Visit: Payer: Self-pay

## 2024-02-09 DIAGNOSIS — C3491 Malignant neoplasm of unspecified part of right bronchus or lung: Secondary | ICD-10-CM | POA: Diagnosis not present

## 2024-02-09 DIAGNOSIS — Z87891 Personal history of nicotine dependence: Secondary | ICD-10-CM | POA: Diagnosis not present

## 2024-02-09 DIAGNOSIS — C7951 Secondary malignant neoplasm of bone: Secondary | ICD-10-CM | POA: Diagnosis not present

## 2024-02-09 DIAGNOSIS — C3411 Malignant neoplasm of upper lobe, right bronchus or lung: Secondary | ICD-10-CM | POA: Diagnosis not present

## 2024-02-09 DIAGNOSIS — Z510 Encounter for antineoplastic radiation therapy: Secondary | ICD-10-CM | POA: Diagnosis not present

## 2024-02-09 LAB — RAD ONC ARIA SESSION SUMMARY
Course Elapsed Days: 20
Plan Fractions Treated to Date: 9
Plan Prescribed Dose Per Fraction: 5 Gy
Plan Total Fractions Prescribed: 10
Plan Total Prescribed Dose: 50 Gy
Reference Point Dosage Given to Date: 45 Gy
Reference Point Session Dosage Given: 5 Gy
Session Number: 14

## 2024-02-10 ENCOUNTER — Ambulatory Visit

## 2024-02-10 ENCOUNTER — Other Ambulatory Visit: Payer: Self-pay

## 2024-02-10 ENCOUNTER — Ambulatory Visit
Admission: RE | Admit: 2024-02-10 | Discharge: 2024-02-10 | Disposition: A | Discharge status: Home / Self Care | Source: Ambulatory Visit | Attending: Radiation Oncology | Admitting: Radiation Oncology

## 2024-02-10 DIAGNOSIS — C3491 Malignant neoplasm of unspecified part of right bronchus or lung: Secondary | ICD-10-CM | POA: Diagnosis not present

## 2024-02-10 DIAGNOSIS — Z510 Encounter for antineoplastic radiation therapy: Secondary | ICD-10-CM | POA: Diagnosis not present

## 2024-02-10 DIAGNOSIS — C7951 Secondary malignant neoplasm of bone: Secondary | ICD-10-CM | POA: Diagnosis not present

## 2024-02-10 DIAGNOSIS — C3411 Malignant neoplasm of upper lobe, right bronchus or lung: Secondary | ICD-10-CM | POA: Diagnosis not present

## 2024-02-10 DIAGNOSIS — Z87891 Personal history of nicotine dependence: Secondary | ICD-10-CM | POA: Diagnosis not present

## 2024-02-10 LAB — RAD ONC ARIA SESSION SUMMARY
Course Elapsed Days: 21
Plan Fractions Treated to Date: 10
Plan Prescribed Dose Per Fraction: 5 Gy
Plan Total Fractions Prescribed: 10
Plan Total Prescribed Dose: 50 Gy
Reference Point Dosage Given to Date: 50 Gy
Reference Point Session Dosage Given: 5 Gy
Session Number: 15

## 2024-02-11 ENCOUNTER — Ambulatory Visit

## 2024-02-11 NOTE — Radiation Completion Notes (Addendum)
  Radiation Oncology         [phone] ________________________________  Name: Regina Young MRN: [id number]  Date of Service: 02/10/2024  DOB: 1956/07/13  End of Treatment Note   Diagnosis: Stage IV (cT1b, cN0, M1) adenocarcinoma, NSCLC, of the right lung with spinal/sacral metastasis  Intent: Palliative     ==========DELIVERED PLANS==========  First Treatment Date: 2024-01-20 Last Treatment Date: 2024-02-10   Plan Name: Lung_Rt_UHRT Site: Lung, Right Technique: IMRT Mode: Photon Dose Per Fraction: 5 Gy Prescribed Dose (Delivered / Prescribed): 50 Gy / 50 Gy Prescribed Fxs (Delivered / Prescribed): 10 / 10   Plan Name: Spine Site: Lumbar Spine Technique: 3D Mode: Photon Dose Per Fraction: 3 Gy Prescribed Dose (Delivered / Prescribed): 30 Gy / 30 Gy Prescribed Fxs (Delivered / Prescribed): 10 / 10     ====================================   The patient tolerated radiation with no significant side effects. She continued to experience right lower extremity pain throughout her treatment.   The patient will return in one month and will continue follow up with Dr. Maria Shiner as well.      Amiel Kalata, PA-C

## 2024-02-14 ENCOUNTER — Ambulatory Visit

## 2024-02-15 ENCOUNTER — Emergency Department (HOSPITAL_COMMUNITY)
Admission: EM | Admit: 2024-02-15 | Discharge: 2024-02-15 | Disposition: A | Discharge status: Home / Self Care | Attending: Emergency Medicine | Admitting: Emergency Medicine

## 2024-02-15 ENCOUNTER — Emergency Department (HOSPITAL_COMMUNITY)

## 2024-02-15 ENCOUNTER — Other Ambulatory Visit: Payer: Self-pay | Admitting: Medical Oncology

## 2024-02-15 ENCOUNTER — Telehealth: Payer: Self-pay

## 2024-02-15 ENCOUNTER — Emergency Department (HOSPITAL_BASED_OUTPATIENT_CLINIC_OR_DEPARTMENT_OTHER)
Admit: 2024-02-15 | Discharge: 2024-02-15 | Disposition: A | Discharge status: Home / Self Care | Attending: Emergency Medicine

## 2024-02-15 ENCOUNTER — Ambulatory Visit

## 2024-02-15 ENCOUNTER — Encounter (HOSPITAL_COMMUNITY): Payer: Self-pay

## 2024-02-15 DIAGNOSIS — Z85118 Personal history of other malignant neoplasm of bronchus and lung: Secondary | ICD-10-CM | POA: Insufficient documentation

## 2024-02-15 DIAGNOSIS — I82401 Acute embolism and thrombosis of unspecified deep veins of right lower extremity: Secondary | ICD-10-CM | POA: Diagnosis not present

## 2024-02-15 DIAGNOSIS — C20 Malignant neoplasm of rectum: Secondary | ICD-10-CM | POA: Diagnosis not present

## 2024-02-15 DIAGNOSIS — R600 Localized edema: Secondary | ICD-10-CM

## 2024-02-15 DIAGNOSIS — K7689 Other specified diseases of liver: Secondary | ICD-10-CM | POA: Diagnosis not present

## 2024-02-15 DIAGNOSIS — Z85038 Personal history of other malignant neoplasm of large intestine: Secondary | ICD-10-CM | POA: Diagnosis not present

## 2024-02-15 DIAGNOSIS — F17210 Nicotine dependence, cigarettes, uncomplicated: Secondary | ICD-10-CM | POA: Diagnosis not present

## 2024-02-15 DIAGNOSIS — M7989 Other specified soft tissue disorders: Secondary | ICD-10-CM | POA: Diagnosis not present

## 2024-02-15 DIAGNOSIS — Z7901 Long term (current) use of anticoagulants: Secondary | ICD-10-CM | POA: Insufficient documentation

## 2024-02-15 DIAGNOSIS — R2243 Localized swelling, mass and lump, lower limb, bilateral: Secondary | ICD-10-CM | POA: Diagnosis not present

## 2024-02-15 LAB — CBC WITH DIFFERENTIAL/PLATELET
Abs Immature Granulocytes: 0.14 10*3/uL — ABNORMAL HIGH (ref 0.00–0.07)
Basophils Absolute: 0 10*3/uL (ref 0.0–0.1)
Basophils Relative: 0 %
Eosinophils Absolute: 0 10*3/uL (ref 0.0–0.5)
Eosinophils Relative: 0 %
HCT: 34.5 % — ABNORMAL LOW (ref 36.0–46.0)
Hemoglobin: 11.2 g/dL — ABNORMAL LOW (ref 12.0–15.0)
Immature Granulocytes: 2 %
Lymphocytes Relative: 1 %
Lymphs Abs: 0.1 10*3/uL — ABNORMAL LOW (ref 0.7–4.0)
MCH: 30.4 pg (ref 26.0–34.0)
MCHC: 32.5 g/dL (ref 30.0–36.0)
MCV: 93.5 fL (ref 80.0–100.0)
Monocytes Absolute: 0.2 10*3/uL (ref 0.1–1.0)
Monocytes Relative: 2 %
Neutro Abs: 9.1 10*3/uL — ABNORMAL HIGH (ref 1.7–7.7)
Neutrophils Relative %: 95 %
Platelets: 132 10*3/uL — ABNORMAL LOW (ref 150–400)
RBC: 3.69 MIL/uL — ABNORMAL LOW (ref 3.87–5.11)
RDW: 15.5 % (ref 11.5–15.5)
WBC: 9.5 10*3/uL (ref 4.0–10.5)
nRBC: 0 % (ref 0.0–0.2)

## 2024-02-15 LAB — COMPREHENSIVE METABOLIC PANEL WITH GFR
ALT: 41 U/L (ref 0–44)
AST: 31 U/L (ref 15–41)
Albumin: 3.4 g/dL — ABNORMAL LOW (ref 3.5–5.0)
Alkaline Phosphatase: 57 U/L (ref 38–126)
Anion gap: 11 (ref 5–15)
BUN: 34 mg/dL — ABNORMAL HIGH (ref 8–23)
CO2: 25 mmol/L (ref 22–32)
Calcium: 8.6 mg/dL — ABNORMAL LOW (ref 8.9–10.3)
Chloride: 102 mmol/L (ref 98–111)
Creatinine, Ser: 0.8 mg/dL (ref 0.44–1.00)
GFR, Estimated: >60 mL/min (ref >60)
Glucose, Bld: 118 mg/dL — ABNORMAL HIGH (ref 70–99)
Potassium: 4 mmol/L (ref 3.5–5.1)
Sodium: 138 mmol/L (ref 135–145)
Total Bilirubin: 0.5 mg/dL (ref 0.0–1.2)
Total Protein: 6.2 g/dL — ABNORMAL LOW (ref 6.5–8.1)

## 2024-02-15 LAB — BRAIN NATRIURETIC PEPTIDE: B Natriuretic Peptide: 46.9 pg/mL (ref 0.0–100.0)

## 2024-02-15 MED ORDER — IOHEXOL 350 MG/ML SOLN
100.0000 mL | Freq: Once | INTRAVENOUS | Status: AC | PRN
  Administered 2024-02-15: 100 mL via INTRAVENOUS

## 2024-02-15 MED ORDER — FUROSEMIDE 20 MG PO TABS: 20.0000 mg | ORAL_TABLET | Freq: Every day | ORAL | 0 refills | Status: DC

## 2024-02-15 NOTE — ED Provider Notes (Signed)
 [place] EMERGENCY DEPARTMENT AT [facility] Provider Note  CSN: [id number] Arrival date & time: 02/15/24 1317  Chief Complaint(s) Leg Swelling  HPI Olla Delancey is a 68 y.o. female with past medical history as below, significant for GERD, HLD, lung cancer/colon cancer who presents to the ED with complaint of leg pain and swelling  Recent dx of dvt to RLE, started on DOAC, has been compliant. Noticed worsened swelling to RLE and some difficulty w/ ambulation 2/2 leg swelling. Some pain with palpation and manipulation of the leg. No falls or injuries. 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 Authorizing Provider  acetaminophen  (TYLENOL ) 500 MG tablet Take 500-1,000 mg by mouth every 6 (six) hours as needed (PAIN).   Yes [provider]  alendronate (FOSAMAX) 70 MG  tablet Take 70 mg by mouth See admin instructions. Take 70 mg every Sunday by mouth 30 minutes before the first food, beverage or medicine of the day with plain water 04/07/21  Yes [provider]  apixaban  (ELIQUIS ) 5 MG TABS tablet Take 2 tablets (10mg ) twice daily for 7 days, then 1 tablet (5mg ) twice daily Patient taking differently: Take 5 mg by mouth 2 (two) times daily. 02/01/24  Yes Covington, Isa Manuel M, PA-C  Calcium  Citrate 250 MG TABS Take 250 mg by mouth in the morning.   Yes [provider]  Cholecalciferol (VITAMIN D3) 50 MCG (2000 UT) TABS Take 2,000 Units by mouth daily with breakfast.   Yes [provider]  cyanocobalamin  (VITAMIN B12) 1000 MCG tablet Take 1,000 mcg by mouth daily.   Yes [provider]  dexamethasone  (DECADRON ) 4 MG tablet Take Decadron  20 mg (five tablets) today and then take Decadron  12 mg (three tablets) daily.  Take in the AM with food. Patient taking differently: Take 12 mg by mouth daily with breakfast. 01/17/24  Yes Ennever, Sherryll Donald, MD  furosemide (LASIX) 20 MG tablet Take 1 tablet (20 mg total) by mouth daily for 5 days. 02/15/24 02/20/24 Yes Russella Courts A, DO  gabapentin  (NEURONTIN ) 300 MG capsule Take 1 capsule (300 mg total) by mouth 3 (three) times daily. 12/26/23 02/24/24 Yes Etter Hermann., MD  morphine  (MS CONTIN ) 15 MG 12 hr tablet Take 1 tablet (15 mg total) by mouth every 12 (twelve) hours. 01/28/24  Yes Ivor Mars, MD  omeprazole (PRILOSEC OTC) 20  MG tablet Take 20 mg by mouth daily before breakfast.   Yes [provider]  oxyCODONE  (OXY IR/ROXICODONE ) 5 MG immediate release tablet Take one to two tablets every six hours as needed for pain Patient taking differently: Take 5 mg by mouth every 6 (six) hours. 01/28/24  Yes Ivor Mars, MD  rosuvastatin  (CRESTOR ) 5 MG tablet Take 5 mg by mouth in the morning. 11/24/20  Yes [provider]  nystatin  (MYCOSTATIN ) 100000 UNIT/ML suspension Take  5 mLs (500,000 Units total) by mouth 4 (four) times daily. Patient not taking: Reported on 02/15/2024 01/17/24   Ivor Mars, MD                                                                                                                                    Past Surgical History Past Surgical History:  Procedure Laterality Date   ABDOMINAL HYSTERECTOMY  1999   [name], MD   BIOPSY  01/01/2021   Procedure: BIOPSY;  Surgeon: Joyce Nixon, MD;  Location: [facility] ENDOSCOPY;  Service: Endoscopy;;   BRONCHIAL NEEDLE ASPIRATION BIOPSY  01/04/2024   Procedure: BRONCHOSCOPY, WITH NEEDLE ASPIRATION BIOPSY;  Surgeon: Denson Flake, MD;  Location: [facility] ENDOSCOPY;  Service: Pulmonary;;   ENDOBRONCHIAL ULTRASOUND Right 01/04/2024   Procedure: ENDOBRONCHIAL ULTRASOUND (EBUS);  Surgeon: Denson Flake, MD;  Location: [facility] ENDOSCOPY;  Service: Pulmonary;  Laterality: Right;   FLEXIBLE SIGMOIDOSCOPY N/A 01/01/2021   Procedure: FLEXIBLE SIGMOIDOSCOPY WITH BIOPSY;  Surgeon: Joyce Nixon, MD;  Location: [facility] ENDOSCOPY;  Service: Endoscopy;  Laterality: N/A;  IV SEDATION BY SURGEON   XI ROBOTIC ASSISTED LOWER ANTERIOR RESECTION N/A 02/21/2021   Procedure: XI ROBOTIC ASSISTED LOWER ANTERIOR RESECTION;  Surgeon: Joyce Nixon, MD;  Location: [facility] ORS;  Service: General;  Laterality: N/A;   Family History Family History  Problem Relation Age of Onset   Liver cancer Mother     Social History Social History   Tobacco Use   Smoking status: Former    Current packs/day: 0.00    Average packs/day: 0.5 packs/day for 25.0 years (12.5 ttl pk-yrs)    Types: Cigarettes    Quit date: 01/04/2024    Years since quitting: 0.1    Passive exposure: Current   Smokeless tobacco: Never  Vaping Use   Vaping status: Never Used  Substance Use Topics   Alcohol use: Yes    Comment: occas.   Drug use: Never   Allergies Patient has no known allergies.  Review of Systems A thorough review of systems was obtained and  all systems are negative except as noted in the HPI and PMH.   Physical Exam Vital Signs  I have reviewed the triage vital signs BP 139/87 (BP Location: Left Arm)   Pulse 87   Temp 98.2 F (36.8 C) (Oral)   Resp 16   Ht 5\' 3"  (1.6 m)   Wt 48.1 kg   SpO2 96%  BMI 18.78 kg/m  Physical Exam Vitals and nursing note reviewed.  Constitutional:      General: She is not in acute distress.    Appearance: Normal appearance. She is well-developed. She is not ill-appearing.  HENT:     Head: Normocephalic and atraumatic.     Right Ear: External ear normal.     Left Ear: External ear normal.     Nose: Nose normal.     Mouth/Throat:     Mouth: Mucous membranes are moist.  Eyes:     General: No scleral icterus.       Right eye: No discharge.        Left eye: No discharge.  Cardiovascular:     Rate and Rhythm: Normal rate.  Pulmonary:     Effort: Pulmonary effort is normal. No respiratory distress.     Breath sounds: No stridor.  Abdominal:     General: Abdomen is flat. There is no distension.     Tenderness: There is no guarding.  Musculoskeletal:        General: No deformity.     Cervical back: No rigidity.     Right lower leg: Edema present.     Left lower leg: Edema present.     Comments: RLE pitting edema to mid thigh >>  LLE pitting edema to calf   Skin:    General: Skin is warm and dry.     Coloration: Skin is not cyanotic, jaundiced or pale.  Neurological:     Mental Status: She is alert and oriented to person, place, and time.     GCS: GCS eye subscore is 4. GCS verbal subscore is 5. GCS motor subscore is 6.  Psychiatric:        Speech: Speech normal.        Behavior: Behavior normal. Behavior is cooperative.     ED Results and Treatments Labs (all labs ordered are listed, but only abnormal results are displayed) Labs Reviewed  CBC WITH DIFFERENTIAL/PLATELET - Abnormal; Notable for the following components:      Result Value   RBC 3.69 (*)    Hemoglobin 11.2  (*)    HCT 34.5 (*)    Platelets 132 (*)    Neutro Abs 9.1 (*)    Lymphs Abs 0.1 (*)    Abs Immature Granulocytes 0.14 (*)    All other components within normal limits  COMPREHENSIVE METABOLIC PANEL WITH GFR - Abnormal; Notable for the following components:   Glucose, Bld 118 (*)    BUN 34 (*)    Calcium  8.6 (*)    Total Protein 6.2 (*)    Albumin 3.4 (*)    All other components within normal limits  BRAIN NATRIURETIC PEPTIDE                                                                                                                          Radiology CT VENOGRAM ABD/PELVIS/LOWER EXT BILAT Result Date: 02/15/2024 CLINICAL  DATA:  Worsening, now bilateral, lower extremity swelling after recent right leg DVT. History of rectal cancer. EXAM: CT VENOGRAM ABDOMEN AND PELVIS AND LOWER EXTREMITY BILATERAL TECHNIQUE: Venographic phase images of the abdomen, pelvis and lower extremities were obtained following the administration of intravenous contrast. Multiplanar reformats and maximum intensity projections were generated. RADIATION DOSE REDUCTION: This exam was performed according to the departmental dose-optimization program which includes automated exposure control, adjustment of the mA and/or kV according to patient size and/or use of iterative reconstruction technique. CONTRAST:  OMNIPAQUE  IOHEXOL  350 MG/ML SOLN COMPARISON:  Nuclear medicine PET dated 01/03/2024 FINDINGS: Lower chest: No focal consolidation or pulmonary nodule in the lung bases. No pleural effusion or pneumothorax demonstrated. Partially imaged heart size is normal. Hepatobiliary: Scattered hepatic cysts. No intra or extrahepatic biliary ductal dilation. Normal gallbladder. Pancreas: No focal lesions or main ductal dilation. Spleen: Scattered splenic cyst.  Normal splenic size. Adrenals/Urinary Tract: No adrenal nodules. Similar severe right hydroureteronephrosis to the level of the distal ureter adjacent to the right pelvic  surgical clips and sacral mass. Asymmetric enhancement of the left kidney. No left hydronephrosis or renal calculi. No suspicious renal masses. No suspicious filling defect visualized within the opacified portions of the left collecting systems or ureter on delayed imaging. No focal bladder wall thickening. Stomach/Bowel: Ingested radiodense tablet within the gastric antrum. No evidence of bowel wall thickening, distention, or inflammatory changes. Moderate to large volume stool throughout the colon. Appendix is not discretely seen. Vascular/Lymphatic: Aortic atherosclerosis. No enlarged abdominal or pelvic lymph nodes. Reproductive: No adnexal masses. Other: No free fluid, fluid collection, or free air. Surgical clips in the region of bilateral iliac vessels. Musculoskeletal: Slight interval increase in size of a hypoattenuating soft tissue centered within the right sacral ala measuring approximately 5.9 x 4.3 cm (2:46), previously 5.3 x 3.6 cm when remeasured. There is soft tissue extension presacral E at the level of the L5-S1 and posteriorly into the spinal canal (6:86), increased compared to 01/03/2024. Lipoma within the right adductors measuring 3.8 x 2.6 cm (2:86). Circumferential soft tissue edema of the legs, right-greater-than-left. IVC: No evidence for thrombus or stenosis. Portal and mesenteric veins: No evidence for thrombus or stenosis. Bilateral iliac veins: Mild effacement of the proximal left common iliac vein, where it crosses the soft tissue mass at the level of L5-S1. Otherwise, no evidence for thrombus or stenosis. Right lower extremity: No evidence for thrombus involving the common femoral, femoral, popliteal and visualized deep calf veins Left lower extremity: No evidence for thrombus involving the common femoral, femoral, popliteal and visualized deep calf veins. Focal dilation of the distal femoral vein (10:967). IMPRESSION: 1. No evidence of deep venous thrombosis in the bilateral lower  extremities. 2. Mild effacement of the proximal left common iliac vein, where it crosses the soft tissue mass at the level of L5-S1. 3. Slight interval increase in size of a hypoattenuating soft tissue centered within the right sacral ala with increased anterior presacral and posterior spinal canal soft tissue extension. 4. Similar severe right hydroureteronephrosis to the level of the distal ureter adjacent to the right pelvic surgical clips and sacral mass. 5. Circumferential soft tissue edema of the legs, right-greater-than-left. 6.  Aortic Atherosclerosis (ICD10-I70.0). Electronically Signed   By: Limin  Xu M.D.   On: 02/15/2024 16:58   VAS US  LOWER EXTREMITY VENOUS (DVT) (7a-7p) Result Date: 02/15/2024  Lower Venous DVT Study Patient Name:  NOLIE BIGNELL  Date of Exam:   02/15/2024 Medical Rec #: [id number]  Accession #:    [id number] Date of Birth: 03-12-1956      Patient Gender: F Patient Age:   74 years Exam Location:  [facility] Procedure:      VAS US  LOWER EXTREMITY VENOUS (DVT) Referring Phys: Russella Courts --------------------------------------------------------------------------------  Indications: Swelling.  Risk Factors: DVT. Anticoagulation: Eliquis . Limitations: Poor ultrasound/tissue interface. Comparison Study: 02/01/2024 - RIGHT:                    - Findings consistent with age indeterminate deep vein                   thrombosis                   involving the right posterior tibial veins.                    - No cystic structure found in the popliteal fossa.                    LEFT:                   - No evidence of common femoral vein obstruction. Performing Technologist: Lerry Ransom RVT  Examination Guidelines: A complete evaluation includes B-mode imaging, spectral Doppler, color Doppler, and power Doppler as needed of all accessible portions of each vessel. Bilateral testing is considered an integral part of a complete examination. Limited examinations for reoccurring  indications may be performed as noted. The reflux portion of the exam is performed with the patient in reverse Trendelenburg.  +---------+---------------+---------+-----------+----------+--------------+ RIGHT    CompressibilityPhasicitySpontaneityPropertiesThrombus Aging +---------+---------------+---------+-----------+----------+--------------+ CFV      Full           Yes      Yes                                 +---------+---------------+---------+-----------+----------+--------------+ SFJ      Full                                                        +---------+---------------+---------+-----------+----------+--------------+ FV Prox  Full                                                        +---------+---------------+---------+-----------+----------+--------------+ FV Mid   Full                                                        +---------+---------------+---------+-----------+----------+--------------+ FV DistalFull                                                        +---------+---------------+---------+-----------+----------+--------------+ PFV      Full                                                        +---------+---------------+---------+-----------+----------+--------------+  POP      Full           Yes      Yes                                 +---------+---------------+---------+-----------+----------+--------------+ PTV      Full                                                        +---------+---------------+---------+-----------+----------+--------------+ PERO     Full                                                        +---------+---------------+---------+-----------+----------+--------------+   +----+---------------+---------+-----------+----------+--------------+ LEFTCompressibilityPhasicitySpontaneityPropertiesThrombus Aging +----+---------------+---------+-----------+----------+--------------+ CFV Full            Yes      Yes                                 +----+---------------+---------+-----------+----------+--------------+     Summary: RIGHT: - There is no evidence of deep vein thrombosis in the lower extremity. However, portions of this examination were limited- see technologist comments above.  - No cystic structure found in the popliteal fossa.  LEFT: - No evidence of common femoral vein obstruction.   *See table(s) above for measurements and observations.    Preliminary     Pertinent labs & imaging results that were available during my care of the patient were reviewed by me and considered in my medical decision making (see MDM for details).  Medications Ordered in ED Medications  iohexol  (OMNIPAQUE ) 350 MG/ML injection 100 mL (100 mLs Intravenous Contrast Given 02/15/24 1616)                                                                                                                                     Procedures Procedures  (including critical care time)  Medical Decision Making / ED Course    Medical Decision Making:    Sameerah Nachtigal is a 68 y.o. female w/ hx as above here w/ RLE swelling > LLE swelling. The complaint involves an extensive differential diagnosis and also carries with it a high risk of complications and morbidity.  Serious etiology was considered. Ddx includes but is not limited to: dvt, svt, lymphadema, chf, nephrotic syndrome, etc  Complete initial physical exam performed, notably the patient was in no distress, no hypoxia.    Reviewed and confirmed nursing documentation for past medical history, family history, social history.  Vital signs reviewed.     Brief summary:  68 yo/f w/ hx as above here w/ leg swelling  Report of recent RLE DVT, compliant w/ doac  Swelling worsened. Will recheck duplex to see if progression > this was negative. Will check CT venogram to see if proximal clot. This is also negative for VTE but does show progression of her  known metastatic disease which is likely factor to her ongoing leg swelling. Her labs are stable. Her LE are NVI. She is ambulatory. Advised upon compression stockings/ leg elevation/ and start short course of lasix. Advised to f/u with her pcp for recheck, she sees oncology on Friday (per pt)  She has no cp or dib, no hypoxia or tachycardia, no DVT on imaging; PE less likely   Clinical Course as of 02/15/24 2124  Tue Feb 15, 2024  1606 Duplex neg per vascular tech [SG]    Clinical Course User Index [SG] Russella Courts A, DO     Patient in no distress and overall condition improved here in the ED. Detailed discussions were had with the patient/guardian regarding current findings, and need for close f/u with PCP or on call doctor. The patient/guardian has been instructed to return immediately if the symptoms worsen in any way for re-evaluation. Patient/guardian verbalized understanding and is in agreement with current care plan. All questions answered prior to discharge.              Additional history obtained: -Additional history obtained from spouse -External records from outside source obtained and reviewed including: Chart review including previous notes, labs, imaging, consultation notes including  Home medications Primary care documentation   Lab Tests: -I ordered, reviewed, and interpreted labs.   The pertinent results include:   Labs Reviewed  CBC WITH DIFFERENTIAL/PLATELET - Abnormal; Notable for the following components:      Result Value   RBC 3.69 (*)    Hemoglobin 11.2 (*)    HCT 34.5 (*)    Platelets 132 (*)    Neutro Abs 9.1 (*)    Lymphs Abs 0.1 (*)    Abs Immature Granulocytes 0.14 (*)    All other components within normal limits  COMPREHENSIVE METABOLIC PANEL WITH GFR - Abnormal; Notable for the following components:   Glucose, Bld 118 (*)    BUN 34 (*)    Calcium  8.6 (*)    Total Protein 6.2 (*)    Albumin 3.4 (*)    All other components within  normal limits  BRAIN NATRIURETIC PEPTIDE    Notable for labs stable  EKG   EKG Interpretation Date/Time:    Ventricular Rate:    PR Interval:    QRS Duration:    QT Interval:    QTC Calculation:   R Axis:      Text Interpretation:           Imaging Studies ordered: I ordered imaging studies including rle duplex, ctv I independently visualized the following imaging with scope of interpretation limited to determining acute life threatening conditions related to emergency care; findings noted above I agree with the radiologist interpretation If any imaging was obtained with contrast I closely monitored patient for any possible adverse reaction a/w contrast administration in the emergency department   Medicines ordered and prescription drug management: Meds ordered this encounter  Medications   iohexol  (OMNIPAQUE ) 350 MG/ML injection 100 mL   furosemide (LASIX) 20 MG tablet    Sig: Take 1 tablet (20 mg total) by mouth daily  for 5 days.    Dispense:  5 tablet    Refill:  0    -I have reviewed the patients home medicines and have made adjustments as needed   Consultations Obtained: na   Cardiac Monitoring: Continuous pulse oximetry interpreted by myself, 97% on ra.    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    Final Clinical Impression(s) / ED Diagnoses Final diagnoses:  Peripheral edema        Teddi Favors, DO 02/15/24 2124

## 2024-02-15 NOTE — Telephone Encounter (Signed)
 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 Declines any SOB or chest pain at this time. Discussed with Conchita Deem who requested if vascular could see her today, called and unfortunately they are unable to get patient in today. Per Dr.Ennever patient to report to [facility] emergency room for evaluation. Pt informed.

## 2024-02-15 NOTE — ED Triage Notes (Addendum)
 Pt c/o increasing RLE swelling since 5/22 and LLE x1 week.  Pain score 8/10.  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  Last treatment was 6/5.  Pt noted to have 2+ pitting edema in RLE and 3+ in R ankle.  No warmth or discoloration noted.    No significant swelling noted to LLE.

## 2024-02-15 NOTE — Progress Notes (Signed)
 Right lower extremity venous duplex has been completed. Preliminary results can be found in CV Proc through chart review.  Results were given to Dr. Martina Sledge.  02/15/24 4:05 PM Birda Buffy RVT

## 2024-02-15 NOTE — Discharge Instructions (Signed)
 It was a pleasure caring for you today in the emergency department.  Keep legs elevated when sitting or lying down. Keep compression stockings or leg wrappings on your legs throughout the day. Please follow up with your pcp for recheck to ensure the swelling is improving.  Please return to the emergency department for any worsening or worrisome symptoms.

## 2024-02-16 ENCOUNTER — Telehealth: Payer: Self-pay | Admitting: Medical Oncology

## 2024-02-16 ENCOUNTER — Ambulatory Visit

## 2024-02-16 ENCOUNTER — Other Ambulatory Visit: Payer: Self-pay | Admitting: Internal Medicine

## 2024-02-16 DIAGNOSIS — Z1231 Encounter for screening mammogram for malignant neoplasm of breast: Secondary | ICD-10-CM

## 2024-02-16 NOTE — Telephone Encounter (Signed)
 Calling to discuss her edema and recent ER visit.   Review of CT scan shows no evidence of DVT of bilateral extremities. It did show stable severe right hydroureteronephrosis. When asked she reports that she has seen [facility] regarding the severe right hydroureteronephrosis but reports that she is unclear on the management going forward.   Pro BNP- normal at 46.9 CMP shows low protein level of 6.2 with normal AST/ALT CBC shows no significant anemia.   She has continued her Xarelto.  She has follow up with our office in 2 days for lab and to see Dr. Maria Shiner. For now she will increase protein intake and we will reach out to [facility] for more information as the severe hydroureteronephrosis may be involved.

## 2024-02-17 ENCOUNTER — Other Ambulatory Visit: Payer: Self-pay

## 2024-02-17 ENCOUNTER — Ambulatory Visit

## 2024-02-17 DIAGNOSIS — C3490 Malignant neoplasm of unspecified part of unspecified bronchus or lung: Secondary | ICD-10-CM

## 2024-02-17 DIAGNOSIS — C187 Malignant neoplasm of sigmoid colon: Secondary | ICD-10-CM

## 2024-02-18 ENCOUNTER — Inpatient Hospital Stay: Attending: Hematology & Oncology

## 2024-02-18 ENCOUNTER — Encounter: Payer: Self-pay | Admitting: Hematology & Oncology

## 2024-02-18 ENCOUNTER — Inpatient Hospital Stay: Admitting: Hematology & Oncology

## 2024-02-18 VITALS — Ht 63.0 in | Wt 107.5 lb

## 2024-02-18 DIAGNOSIS — C7951 Secondary malignant neoplasm of bone: Secondary | ICD-10-CM | POA: Diagnosis not present

## 2024-02-18 DIAGNOSIS — Z923 Personal history of irradiation: Secondary | ICD-10-CM | POA: Insufficient documentation

## 2024-02-18 DIAGNOSIS — C3491 Malignant neoplasm of unspecified part of right bronchus or lung: Secondary | ICD-10-CM

## 2024-02-18 DIAGNOSIS — C3490 Malignant neoplasm of unspecified part of unspecified bronchus or lung: Secondary | ICD-10-CM

## 2024-02-18 DIAGNOSIS — Z86718 Personal history of other venous thrombosis and embolism: Secondary | ICD-10-CM | POA: Diagnosis not present

## 2024-02-18 DIAGNOSIS — C187 Malignant neoplasm of sigmoid colon: Secondary | ICD-10-CM

## 2024-02-18 DIAGNOSIS — Z85038 Personal history of other malignant neoplasm of large intestine: Secondary | ICD-10-CM | POA: Insufficient documentation

## 2024-02-18 DIAGNOSIS — Z7901 Long term (current) use of anticoagulants: Secondary | ICD-10-CM | POA: Diagnosis not present

## 2024-02-18 LAB — CBC WITH DIFFERENTIAL (CANCER CENTER ONLY)
Abs Immature Granulocytes: 0.15 10*3/uL — ABNORMAL HIGH (ref 0.00–0.07)
Basophils Absolute: 0 10*3/uL (ref 0.0–0.1)
Basophils Relative: 0 %
Eosinophils Absolute: 0.1 10*3/uL (ref 0.0–0.5)
Eosinophils Relative: 0 %
HCT: 35.9 % — ABNORMAL LOW (ref 36.0–46.0)
Hemoglobin: 11.7 g/dL — ABNORMAL LOW (ref 12.0–15.0)
Immature Granulocytes: 1 %
Lymphocytes Relative: 1 %
Lymphs Abs: 0.1 10*3/uL — ABNORMAL LOW (ref 0.7–4.0)
MCH: 29.3 pg (ref 26.0–34.0)
MCHC: 32.6 g/dL (ref 30.0–36.0)
MCV: 89.8 fL (ref 80.0–100.0)
Monocytes Absolute: 0.5 10*3/uL (ref 0.1–1.0)
Monocytes Relative: 4 %
Neutro Abs: 11 10*3/uL — ABNORMAL HIGH (ref 1.7–7.7)
Neutrophils Relative %: 94 %
Platelet Count: 166 10*3/uL (ref 150–400)
RBC: 4 MIL/uL (ref 3.87–5.11)
RDW: 15.6 % — ABNORMAL HIGH (ref 11.5–15.5)
WBC Count: 11.7 10*3/uL — ABNORMAL HIGH (ref 4.0–10.5)
nRBC: 0 % (ref 0.0–0.2)

## 2024-02-18 LAB — CMP (CANCER CENTER ONLY)
ALT: 37 U/L (ref 0–44)
AST: 17 U/L (ref 15–41)
Albumin: 4.1 g/dL (ref 3.5–5.0)
Alkaline Phosphatase: 53 U/L (ref 38–126)
Anion gap: 10 (ref 5–15)
BUN: 44 mg/dL — ABNORMAL HIGH (ref 8–23)
CO2: 30 mmol/L (ref 22–32)
Calcium: 9.5 mg/dL (ref 8.9–10.3)
Chloride: 98 mmol/L (ref 98–111)
Creatinine: 0.97 mg/dL (ref 0.44–1.00)
GFR, Estimated: >60 mL/min (ref >60)
Glucose, Bld: 105 mg/dL — ABNORMAL HIGH (ref 70–99)
Potassium: 4.1 mmol/L (ref 3.5–5.1)
Sodium: 138 mmol/L (ref 135–145)
Total Bilirubin: 0.5 mg/dL (ref 0.0–1.2)
Total Protein: 6.4 g/dL — ABNORMAL LOW (ref 6.5–8.1)

## 2024-02-18 LAB — PREALBUMIN: Prealbumin: 30 mg/dL (ref 18–38)

## 2024-02-18 LAB — LACTATE DEHYDROGENASE: LDH: 191 U/L (ref 98–192)

## 2024-02-18 MED ORDER — DEXAMETHASONE 4 MG PO TABS: ORAL_TABLET | ORAL | 0 refills | Status: DC

## 2024-02-18 NOTE — Progress Notes (Signed)
 START ON PATHWAY REGIMEN - Non-Small Cell Lung     A cycle is every 21 days:     Pembrolizumab      Pemetrexed      Carboplatin   **Always confirm dose/schedule in your pharmacy ordering system**  Patient Characteristics: Stage IV Metastatic, Nonsquamous, Molecular Analysis Completed, Molecular Alteration Present and Targeted Therapy Exhausted OR KRAS G12C+ or HER2+ or NRG1+ Present and No Prior Chemo/Immunotherapy OR No Alteration Present, Initial  Chemotherapy/Immunotherapy, PS = 0, 1, No Alteration Present, No Alteration Present, Candidate for Immunotherapy, PD-L1 Expression Positive 1-49% (TPS) / Negative / Not Tested / Awaiting Test Results and Immunotherapy Candidate Therapeutic Status: Stage IV Metastatic Histology: Nonsquamous Cell Broad Molecular Profiling Status: Molecular Analysis Completed Molecular Analysis Results: No Alteration Present ECOG Performance Status: 1 Chemotherapy/Immunotherapy Line of Therapy: Initial Chemotherapy/Immunotherapy EGFR Exons 18-21 Mutation Testing Status: Completed and Negative ALK Fusion/Rearrangement Testing Status: Completed and Negative BRAF V600 Mutation Testing Status: Completed and Negative KRAS G12C Mutation Testing Status: Completed and Negative MET Exon 14 Mutation Testing Status: Completed and Negative RET Fusion/Rearrangement Testing Status: Completed and Negative NRG1 Fusion/Rearrangement Testing Status: Completed and Negative HER2 Mutation Testing Status: Completed and Negative NTRK Fusion/Rearrangement Testing Status: Completed and Negative ROS1 Fusion/Rearrangement Testing Status: Completed and Negative Immunotherapy Candidate Status: Candidate for Immunotherapy PD-L1 Expression Status: PD-L1 Positive 1-49% (TPS) Intent of Therapy: Non-Curative / Palliative Intent, Discussed with Patient

## 2024-02-18 NOTE — Progress Notes (Signed)
 Hematology and Oncology Follow Up Visit  Regina Young [id number] [birth year] 68 y.o. 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 twice daily --start 02/01/2024     Interim History:  Regina Young is in for follow-up.  She has completed radiotherapy to the sacral metastasis.  She did well with this.  She is not hurting as much.    I think the Decadron  helped quite a bit.  We will try to taper off the Decadron  now.  Her appetite is doing quite good.  At this point, I think we are going to have to get her on systemic chemotherapy.  I think that she would do well with chemotherapy and immunotherapy.  I talked to she and her husband about this.  I talked to them about the possibility of needing a Port-A-Cath.  I think that she probably will need to have a Port-A-Cath.  I think a good protocol for her would be carboplatinum/Alimta/pembrolizumab.  I think this would certainly work well for her.  She has had no problems with going to the bathroom..  She recent was found to have a thrombus in the right leg.  She now is on Eliquis ..  She has had no fever.  She has had no headache.  Overall, I would say that her performance status is probably ECOG 1.   Medications:  Current Outpatient Medications:    alendronate (FOSAMAX) 70 MG tablet, Take 70 mg by mouth See admin instructions. Take 70 mg every Sunday by mouth 30 minutes before the first food, beverage or medicine of the day with plain water, Disp: , Rfl:    apixaban  (ELIQUIS ) 5 MG TABS tablet, Take 2 tablets (10mg ) twice daily for 7 days, then 1 tablet (5mg ) twice  daily, Disp: 60 tablet, Rfl: 0   Calcium  Citrate 250 MG TABS, Take 250 mg by mouth in the morning., Disp: , Rfl:    Cholecalciferol (VITAMIN D3) 50 MCG (2000 UT) TABS, Take 2,000 Units by mouth daily with breakfast., Disp: , Rfl:    cyanocobalamin  (VITAMIN B12) 1000 MCG tablet, Take 1,000 mcg by mouth daily., Disp: , Rfl:    dexamethasone  (DECADRON ) 4 MG tablet, Take Decadron  20 mg (five tablets) today and then take Decadron  12 mg (three tablets) daily.  Take in the AM with food., Disp: 100 tablet, Rfl: 0   furosemide  (LASIX ) 20 MG tablet, Take 1 tablet (20 mg total) by mouth daily for 5 days., Disp: 5 tablet, Rfl: 0   gabapentin  (NEURONTIN ) 300 MG capsule, Take 1 capsule (300 mg total) by mouth 3 (three) times daily., Disp: 90 capsule, Rfl: 1   morphine  (MS CONTIN ) 15 MG 12 hr tablet, Take 1 tablet (15 mg total) by mouth every 12 (twelve) hours., Disp: 60 tablet, Rfl: 0   omeprazole (PRILOSEC OTC) 20 MG tablet, Take 20 mg by mouth daily before breakfast., Disp: , Rfl:    oxyCODONE  (OXY IR/ROXICODONE ) 5 MG immediate release tablet, Take one to two tablets every six hours as needed for pain, Disp: 90 tablet, Rfl: 0   rosuvastatin  (CRESTOR ) 5 MG tablet, Take 5 mg by mouth in the morning., Disp: , Rfl:  acetaminophen  (TYLENOL ) 500 MG tablet, Take 500-1,000 mg by mouth every 6 (six) hours as needed (PAIN). (Patient not taking: Reported on 02/18/2024), Disp: , Rfl:    nystatin  (MYCOSTATIN ) 100000 UNIT/ML suspension, Take 5 mLs (500,000 Units total) by mouth 4 (four) times daily. (Patient not taking: Reported on 02/18/2024), Disp: 60 mL, Rfl: 0  Allergies: No Known Allergies  Past Medical History, Surgical history, Social history, and Family History were reviewed and updated.  Review of Systems: Review of Systems  Constitutional: Negative.   HENT:  Negative.    Eyes: Negative.   Respiratory: Negative.    Cardiovascular: Negative.   Gastrointestinal:  Positive for abdominal pain.  Endocrine:  Negative.   Genitourinary: Negative.    Musculoskeletal:  Positive for back pain.  Neurological: Negative.   Hematological: Negative.   Psychiatric/Behavioral: Negative.      Physical Exam:  height is 5' 3 (1.6 m) and weight is 107 lb 8 oz (48.8 kg).   Wt Readings from Last 3 Encounters:  02/18/24 107 lb 8 oz (48.8 kg)  02/15/24 106 lb (48.1 kg)  01/28/24 106 lb 1.3 oz (48.1 kg)    Physical Exam Vitals reviewed.  HENT:     Head: Normocephalic and atraumatic.   Eyes:     Pupils: Pupils are equal, round, and reactive to light.    Cardiovascular:     Rate and Rhythm: Normal rate and regular rhythm.     Heart sounds: Normal heart sounds.  Pulmonary:     Effort: Pulmonary effort is normal.     Breath sounds: Normal breath sounds.  Abdominal:     General: Bowel sounds are normal.     Palpations: Abdomen is soft.   Musculoskeletal:        General: No tenderness or deformity. Normal range of motion.     Cervical back: Normal range of motion.  Lymphadenopathy:     Cervical: No cervical adenopathy.   Skin:    General: Skin is warm and dry.     Findings: No erythema or rash.   Neurological:     Mental Status: She is alert and oriented to person, place, and time.   Psychiatric:        Behavior: Behavior normal.        Thought Content: Thought content normal.        Judgment: Judgment normal.     Lab Results  Component Value Date   WBC 11.7 (H) 02/18/2024   HGB 11.7 (L) 02/18/2024   HCT 35.9 (L) 02/18/2024   MCV 89.8 02/18/2024   PLT 166 02/18/2024     Chemistry      Component Value Date/Time   NA 138 02/18/2024 1350   K 4.1 02/18/2024 1350   CL 98 02/18/2024 1350   CO2 30 02/18/2024 1350   BUN 44 (H) 02/18/2024 1350   CREATININE 0.97 02/18/2024 1350      Component Value Date/Time   CALCIUM  9.5 02/18/2024 1350   ALKPHOS 53 02/18/2024 1350   AST 17 02/18/2024 1350   ALT 37 02/18/2024 1350   BILITOT 0.5 02/18/2024 1350      Impression and  Plan: Regina Young is a very charming 68 year old white female.  She has a past history of an early stage colon cancer.  However, now it looks like she has a metastatic non-small cell lung cancer-adenocarcinoma.  She completed the radiotherapy for the sacral metastasis.  We now have to get her on systemic therapy.  Again, I think the  carboplatinum/Alimta/pembrolizumab protocol would be very reasonable for her.  I think the chance of success should be hopefully 70-80%.  Again I do think she is going to need to have a Port-A-Cath placed.  I talked to her about this.  I explained what a Port-A-Cath is.  She is agreeable.  She will need to have the Xgeva/Zometa.  I think this can be critical for her.  And we will going get the started when she starts her chemotherapy.  She needs to taper down her Decadron .  I wrote down the schedule for her to taper down the Decadron .  We will try to get everything started in the next week or 2.  I will plan to see her back when she has her second cycle of treatment.  I will do 3 cycles of treatment and then repeat her PET scan.   Ivor Mars, MD 6/13/20252:31 PM

## 2024-02-19 ENCOUNTER — Other Ambulatory Visit: Payer: Self-pay

## 2024-02-21 ENCOUNTER — Other Ambulatory Visit: Payer: Self-pay | Admitting: Radiology

## 2024-02-21 ENCOUNTER — Other Ambulatory Visit: Payer: Self-pay

## 2024-02-21 ENCOUNTER — Encounter: Payer: Self-pay | Admitting: Hematology & Oncology

## 2024-02-21 ENCOUNTER — Other Ambulatory Visit: Payer: Self-pay | Admitting: Student

## 2024-02-21 ENCOUNTER — Telehealth: Payer: Self-pay

## 2024-02-21 DIAGNOSIS — Z01818 Encounter for other preprocedural examination: Secondary | ICD-10-CM

## 2024-02-21 DIAGNOSIS — C3491 Malignant neoplasm of unspecified part of right bronchus or lung: Secondary | ICD-10-CM

## 2024-02-21 DIAGNOSIS — R590 Localized enlarged lymph nodes: Secondary | ICD-10-CM

## 2024-02-21 DIAGNOSIS — C187 Malignant neoplasm of sigmoid colon: Secondary | ICD-10-CM

## 2024-02-21 DIAGNOSIS — C3490 Malignant neoplasm of unspecified part of unspecified bronchus or lung: Secondary | ICD-10-CM

## 2024-02-21 DIAGNOSIS — C786 Secondary malignant neoplasm of retroperitoneum and peritoneum: Secondary | ICD-10-CM

## 2024-02-21 MED ORDER — GABAPENTIN 300 MG PO CAPS: 300.0000 mg | ORAL_CAPSULE | Freq: Three times a day (TID) | ORAL | 1 refills | Status: DC

## 2024-02-21 MED ORDER — OXYCODONE HCL 5 MG PO TABS: ORAL_TABLET | ORAL | 0 refills | Status: DC

## 2024-02-21 MED ORDER — APIXABAN 5 MG PO TABS: 5.0000 mg | ORAL_TABLET | Freq: Two times a day (BID) | ORAL | 3 refills | Status: DC

## 2024-02-21 NOTE — Telephone Encounter (Signed)
 Per Dr. Maria Shiner, pt to start chemotherapy in the next two weeks. This RN called patient and educated her on the next steps of starting treatment including port placement, chemotherapy education and treatment.  Pt scheduled for port placement at [facility] on 02/22/2024 at 11 am. Pt educated briefly on the procedure and dermabond that will be covering the incision site. Pt educated that she will have possible bruising and swelling to site along with discomfort. Pt educated on the dermabond and signs of infection and she verbalized understanding.  Patient scheduled for chemotherapy education and treatment on 02/29/2024. Pt educated on what to expect for that day and that she can have one visitor with her for the day.  Pt during the conversations had moments where she felt overwhelmed and started to cry. This RN provided comfort stating its overwhelming in the beginning but we are here to help answer any questions and guide you along the way. Pt educated that she can call with any questions or concerns. Pt verbalized understanding and had no further questions.

## 2024-02-22 ENCOUNTER — Encounter (HOSPITAL_COMMUNITY): Payer: Self-pay

## 2024-02-22 ENCOUNTER — Ambulatory Visit (HOSPITAL_COMMUNITY)
Admission: RE | Admit: 2024-02-22 | Discharge: 2024-02-22 | Disposition: A | Discharge status: Home / Self Care | Source: Ambulatory Visit | Attending: Hematology & Oncology | Admitting: Hematology & Oncology

## 2024-02-22 ENCOUNTER — Other Ambulatory Visit: Payer: Self-pay

## 2024-02-22 DIAGNOSIS — Z87891 Personal history of nicotine dependence: Secondary | ICD-10-CM | POA: Insufficient documentation

## 2024-02-22 DIAGNOSIS — C3491 Malignant neoplasm of unspecified part of right bronchus or lung: Secondary | ICD-10-CM | POA: Diagnosis not present

## 2024-02-22 DIAGNOSIS — Z7901 Long term (current) use of anticoagulants: Secondary | ICD-10-CM | POA: Insufficient documentation

## 2024-02-22 DIAGNOSIS — R590 Localized enlarged lymph nodes: Secondary | ICD-10-CM

## 2024-02-22 DIAGNOSIS — C799 Secondary malignant neoplasm of unspecified site: Secondary | ICD-10-CM | POA: Diagnosis not present

## 2024-02-22 DIAGNOSIS — I82401 Acute embolism and thrombosis of unspecified deep veins of right lower extremity: Secondary | ICD-10-CM | POA: Diagnosis not present

## 2024-02-22 DIAGNOSIS — Z01818 Encounter for other preprocedural examination: Secondary | ICD-10-CM

## 2024-02-22 HISTORY — PX: IR IMAGING GUIDED PORT INSERTION: IMG5740

## 2024-02-22 MED ORDER — LIDOCAINE-EPINEPHRINE 1 %-1:100000 IJ SOLN
INTRAMUSCULAR | Status: AC
Start: 2024-02-22 — End: 2024-02-22
  Filled 2024-02-22: qty 1

## 2024-02-22 MED ORDER — MIDAZOLAM HCL 2 MG/2ML IJ SOLN
INTRAMUSCULAR | Status: AC | PRN
  Administered 2024-02-22 (×2): 1 mg via INTRAVENOUS

## 2024-02-22 MED ORDER — HEPARIN SOD (PORK) LOCK FLUSH 100 UNIT/ML IV SOLN
500.0000 [IU] | Freq: Once | INTRAVENOUS | Status: AC
  Administered 2024-02-22: 500 [IU]

## 2024-02-22 MED ORDER — FENTANYL CITRATE (PF) 100 MCG/2ML IJ SOLN
INTRAMUSCULAR | Status: AC | PRN
  Administered 2024-02-22: 25 ug via INTRAVENOUS

## 2024-02-22 MED ORDER — LIDOCAINE-EPINEPHRINE (PF) 1 %-1:200000 IJ SOLN
20.0000 mL | Freq: Once | INTRAMUSCULAR | Status: AC
  Administered 2024-02-22: 20 mL

## 2024-02-22 MED ORDER — HEPARIN SOD (PORK) LOCK FLUSH 100 UNIT/ML IV SOLN
INTRAVENOUS | Status: AC
  Filled 2024-02-22: qty 5

## 2024-02-22 MED ORDER — FENTANYL CITRATE (PF) 100 MCG/2ML IJ SOLN
INTRAMUSCULAR | Status: AC
Start: 2024-02-22 — End: 2024-02-22
  Filled 2024-02-22: qty 2

## 2024-02-22 MED ORDER — MIDAZOLAM HCL 2 MG/2ML IJ SOLN
INTRAMUSCULAR | Status: AC
  Filled 2024-02-22: qty 4

## 2024-02-22 NOTE — H&P (Signed)
 Chief Complaint: Patient was seen in consultation today for lung cancer  Referring Physician(s): Ennever,Peter R  Supervising Physician: Art Largo  Patient Status: [facility] - Out-pt  History of Present Illness: Regina Young is a 68 y.o. female with metastatic non-small cell lung cancer s/p radiation to the lung primary and sacral metastasis.  She is now scheduled for upcoming chemotherapy and is in need of durable venous access. She presents to [facility] today for Port-A-Cath placement.   Of note, she was recently started on Eliquis  for new RLE DVT.  She is accompanied by her husband who assists with care at home.  He is available for post-procedure care and transportation. She is understanding of the goals of the procedure and is agreeable to proceed.   Patient is FULL CODE.  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) 01/10/2024    Past Surgical History:  Procedure Laterality Date   ABDOMINAL HYSTERECTOMY  1999   [name], MD   BIOPSY  01/01/2021   Procedure: BIOPSY;  Surgeon: Joyce Nixon, MD;  Location: [facility] ENDOSCOPY;  Service: Endoscopy;;   BRONCHIAL NEEDLE ASPIRATION BIOPSY  01/04/2024   Procedure: BRONCHOSCOPY, WITH NEEDLE ASPIRATION BIOPSY;  Surgeon: Denson Flake, MD;  Location: [facility] ENDOSCOPY;  Service: Pulmonary;;   ENDOBRONCHIAL ULTRASOUND Right 01/04/2024   Procedure: ENDOBRONCHIAL ULTRASOUND (EBUS);  Surgeon: Denson Flake, MD;  Location: [facility] ENDOSCOPY;  Service: Pulmonary;  Laterality: Right;   FLEXIBLE SIGMOIDOSCOPY N/A 01/01/2021   Procedure: FLEXIBLE SIGMOIDOSCOPY WITH BIOPSY;  Surgeon: Joyce Nixon, MD;  Location: [facility] ENDOSCOPY;  Service: Endoscopy;  Laterality: N/A;  IV SEDATION  BY SURGEON   XI ROBOTIC ASSISTED LOWER ANTERIOR RESECTION N/A 02/21/2021   Procedure: XI ROBOTIC ASSISTED LOWER ANTERIOR RESECTION;  Surgeon: Joyce Nixon, MD;  Location: [facility] ORS;  Service: General;  Laterality: N/A;    Allergies: Patient has no known allergies.  Medications: Prior to Admission medications   Medication Sig Start Date End Date Taking? Authorizing Provider  alendronate (FOSAMAX) 70 MG tablet Take 70 mg by mouth See admin instructions. Take 70 mg every Sunday by mouth 30 minutes before the first food, beverage or medicine of the day with plain water 04/07/21  Yes [provider]  apixaban  (ELIQUIS ) 5 MG TABS tablet Take 1 tablet (5 mg total) by mouth 2 (two) times daily. 02/21/24  Yes Ivor Mars, MD  Calcium  Citrate 250 MG TABS Take 250 mg by mouth in the morning.   Yes [provider]  Cholecalciferol (VITAMIN D3) 50 MCG (2000 UT) TABS Take 2,000 Units by mouth daily with breakfast.   Yes [provider]  cyanocobalamin  (VITAMIN B12) 1000 MCG tablet Take 1,000 mcg by mouth daily.   Yes [provider]  dexamethasone  (DECADRON ) 4 MG tablet Take Decadron  20 mg (five tablets) today and then take Decadron  12 mg (three tablets) daily.  Take in the AM with food. 02/18/24  Yes Ivor Mars, MD  furosemide  (LASIX ) 20 MG tablet Take 1 tablet (20 mg total) by mouth daily for 5 days. 02/15/24 02/22/24 Yes Teddi Favors, DO  gabapentin  (NEURONTIN ) 300 MG capsule Take 1 capsule (300 mg total) by mouth 3 (three) times daily. 02/21/24 04/21/24 Yes Ivor Mars, MD  morphine  (MS CONTIN ) 15 MG 12 hr tablet Take 1 tablet (15 mg total) by mouth every 12 (twelve) hours. 01/28/24  Yes Ivor Mars, MD  omeprazole (PRILOSEC OTC) 20 MG tablet Take 20 mg by mouth daily before breakfast.   Yes [provider]  oxyCODONE  (OXY IR/ROXICODONE ) 5 MG immediate release tablet Take one to two tablets every six hours as needed for pain 02/21/24  Yes Ennever,  Sherryll Donald, MD  rosuvastatin  (CRESTOR ) 5 MG tablet Take 5 mg by mouth in the morning. 11/24/20  Yes [provider]  acetaminophen  (TYLENOL ) 500 MG tablet Take 500-1,000 mg by mouth every 6 (six) hours as needed (PAIN). Patient not taking: Reported on 02/18/2024    [provider]  nystatin  (MYCOSTATIN ) 100000 UNIT/ML suspension Take 5 mLs (500,000 Units total) by mouth 4 (four) times daily. Patient not taking: Reported on 02/18/2024 01/17/24   Ivor Mars, MD     Family History  Problem Relation Age of Onset   Liver cancer Mother     Social History   Socioeconomic History   Marital status: Married    Spouse name: Not on file   Number of children: Not on file   Years of education: Not on file   Highest education level: Not on file  Occupational History   Not on file  Tobacco Use   Smoking status: Former    Current packs/day: 0.00    Average packs/day: 0.5 packs/day for 25.0 years (12.5 ttl pk-yrs)    Types: Cigarettes    Quit date: 01/04/2024    Years since quitting: 0.1    Passive exposure: Current   Smokeless tobacco: Never  Vaping Use   Vaping status: Never Used  Substance and Sexual Activity   Alcohol use: Yes    Comment: occas.   Drug use: Never   Sexual activity: Not Currently  Other Topics Concern   Not on file  Social History Narrative   Not on file   Social Drivers of Health   Financial Resource Strain: Low Risk  (01/10/2024)   Overall Financial Resource Strain (CARDIA)    Difficulty of Paying Living Expenses: Not hard at all  Food Insecurity: No Food Insecurity (01/17/2024)   Hunger Vital Sign    Worried About Running Out of Food in the Last Year: Never true    Ran Out of Food in the Last Year: Never true  Transportation Needs: No Transportation Needs (01/17/2024)   PRAPARE - Administrator, Civil Service (Medical): No    Lack of Transportation (Non-Medical): No  Physical Activity: Not on file  Stress: Not on file  Social  Connections: Moderately Isolated (12/23/2023)   Social Connection and Isolation Panel    Frequency of Communication with Friends and Family: More than three times a week    Frequency of Social Gatherings with Friends and Family: More than three times a week    Attends Religious Services: Never    Database administrator or Organizations: No    Attends Banker Meetings: Never    Marital Status: Married     Review of Systems: A 12 point ROS discussed and pertinent positives are indicated in the HPI above.  All other systems are negative.  Review of Systems  Constitutional:  Negative for fatigue and fever.  Respiratory:  Negative for cough and shortness of breath.   Cardiovascular:  Negative for chest pain.  Gastrointestinal:  Negative for nausea and vomiting.  Musculoskeletal:  Negative  for back pain.  Psychiatric/Behavioral:  Negative for behavioral problems and confusion.     Vital Signs: BP (!) 173/87   Pulse 84   Temp 97.9 F (36.6 C) (Oral)   Resp 17   Ht 5' 2 (1.575 m)   Wt 110 lb (49.9 kg)   SpO2 97%   BMI 20.12 kg/m   Physical Exam Vitals and nursing note reviewed.  Constitutional:      Appearance: Normal appearance.  HENT:     Mouth/Throat:     Mouth: Mucous membranes are moist.     Pharynx: Oropharynx is clear.   Cardiovascular:     Rate and Rhythm: Normal rate and regular rhythm.  Pulmonary:     Effort: Pulmonary effort is normal. No respiratory distress.     Breath sounds: Normal breath sounds.  Abdominal:     General: Abdomen is flat. There is no distension.   Skin:    General: Skin is warm and dry.   Neurological:     General: No focal deficit present.     Mental Status: She is alert and oriented to person, place, and time. Mental status is at baseline.   Psychiatric:        Mood and Affect: Mood normal.        Behavior: Behavior normal.        Thought Content: Thought content normal.        Judgment: Judgment normal.      MD  Evaluation Airway: WNL Heart: WNL Abdomen: WNL Chest/ Lungs: WNL ASA  Classification: 3 Mallampati/Airway Score: Two   Imaging: VAS US  LOWER EXTREMITY VENOUS (DVT) (7a-7p) Result Date: 02/15/2024  Lower Venous DVT Study Patient Name:  Regina Young  Date of Exam:   02/15/2024 Medical Rec #: [id number]      Accession #:    [id number] Date of Birth: 07-14-1956      Patient Gender: F Patient Age:   89 years Exam Location:  [facility] Procedure:      VAS US  LOWER EXTREMITY VENOUS (DVT) Referring Phys: Russella Courts --------------------------------------------------------------------------------  Indications: Swelling.  Risk Factors: DVT. Anticoagulation: Eliquis . Limitations: Poor ultrasound/tissue interface. Comparison Study: 02/01/2024 - RIGHT:                    - Findings consistent with age indeterminate deep vein                   thrombosis                   involving the right posterior tibial veins.                    - No cystic structure found in the popliteal fossa.                    LEFT:                   - No evidence of common femoral vein obstruction. Performing Technologist: Lerry Ransom RVT  Examination Guidelines: A complete evaluation includes B-mode imaging, spectral Doppler, color Doppler, and power Doppler as needed of all accessible portions of each vessel. Bilateral testing is considered an integral part of a complete examination. Limited examinations for reoccurring indications may be performed as noted. The reflux portion of the exam is performed with the patient in reverse Trendelenburg.  +---------+---------------+---------+-----------+----------+--------------+ RIGHT    CompressibilityPhasicitySpontaneityPropertiesThrombus Aging +---------+---------------+---------+-----------+----------+--------------+ CFV      Full  Yes      Yes                                 +---------+---------------+---------+-----------+----------+--------------+ SFJ       Full                                                        +---------+---------------+---------+-----------+----------+--------------+ FV Prox  Full                                                        +---------+---------------+---------+-----------+----------+--------------+ FV Mid   Full                                                        +---------+---------------+---------+-----------+----------+--------------+ FV DistalFull                                                        +---------+---------------+---------+-----------+----------+--------------+ PFV      Full                                                        +---------+---------------+---------+-----------+----------+--------------+ POP      Full           Yes      Yes                                 +---------+---------------+---------+-----------+----------+--------------+ PTV      Full                                                        +---------+---------------+---------+-----------+----------+--------------+ PERO     Full                                                        +---------+---------------+---------+-----------+----------+--------------+   +----+---------------+---------+-----------+----------+--------------+ LEFTCompressibilityPhasicitySpontaneityPropertiesThrombus Aging +----+---------------+---------+-----------+----------+--------------+ CFV Full           Yes      Yes                                 +----+---------------+---------+-----------+----------+--------------+     Summary: RIGHT: - There is no evidence of deep vein  thrombosis in the lower extremity. However, portions of this examination were limited- see technologist comments above.  - No cystic structure found in the popliteal fossa.  LEFT: - No evidence of common femoral vein obstruction.   *See table(s) above for measurements and observations. Electronically signed by Runell Countryman on  02/15/2024 at 10:06:22 PM.    Final    CT VENOGRAM ABD/PELVIS/LOWER EXT BILAT Result Date: 02/15/2024 CLINICAL DATA:  Worsening, now bilateral, lower extremity swelling after recent right leg DVT. History of rectal cancer. EXAM: CT VENOGRAM ABDOMEN AND PELVIS AND LOWER EXTREMITY BILATERAL TECHNIQUE: Venographic phase images of the abdomen, pelvis and lower extremities were obtained following the administration of intravenous contrast. Multiplanar reformats and maximum intensity projections were generated. RADIATION DOSE REDUCTION: This exam was performed according to the departmental dose-optimization program which includes automated exposure control, adjustment of the mA and/or kV according to patient size and/or use of iterative reconstruction technique. CONTRAST:  OMNIPAQUE  IOHEXOL  350 MG/ML SOLN COMPARISON:  Nuclear medicine PET dated 01/03/2024 FINDINGS: Lower chest: No focal consolidation or pulmonary nodule in the lung bases. No pleural effusion or pneumothorax demonstrated. Partially imaged heart size is normal. Hepatobiliary: Scattered hepatic cysts. No intra or extrahepatic biliary ductal dilation. Normal gallbladder. Pancreas: No focal lesions or main ductal dilation. Spleen: Scattered splenic cyst.  Normal splenic size. Adrenals/Urinary Tract: No adrenal nodules. Similar severe right hydroureteronephrosis to the level of the distal ureter adjacent to the right pelvic surgical clips and sacral mass. Asymmetric enhancement of the left kidney. No left hydronephrosis or renal calculi. No suspicious renal masses. No suspicious filling defect visualized within the opacified portions of the left collecting systems or ureter on delayed imaging. No focal bladder wall thickening. Stomach/Bowel: Ingested radiodense tablet within the gastric antrum. No evidence of bowel wall thickening, distention, or inflammatory changes. Moderate to large volume stool throughout the colon. Appendix is not discretely seen.  Vascular/Lymphatic: Aortic atherosclerosis. No enlarged abdominal or pelvic lymph nodes. Reproductive: No adnexal masses. Other: No free fluid, fluid collection, or free air. Surgical clips in the region of bilateral iliac vessels. Musculoskeletal: Slight interval increase in size of a hypoattenuating soft tissue centered within the right sacral ala measuring approximately 5.9 x 4.3 cm (2:46), previously 5.3 x 3.6 cm when remeasured. There is soft tissue extension presacral E at the level of the L5-S1 and posteriorly into the spinal canal (6:86), increased compared to 01/03/2024. Lipoma within the right adductors measuring 3.8 x 2.6 cm (2:86). Circumferential soft tissue edema of the legs, right-greater-than-left. IVC: No evidence for thrombus or stenosis. Portal and mesenteric veins: No evidence for thrombus or stenosis. Bilateral iliac veins: Mild effacement of the proximal left common iliac vein, where it crosses the soft tissue mass at the level of L5-S1. Otherwise, no evidence for thrombus or stenosis. Right lower extremity: No evidence for thrombus involving the common femoral, femoral, popliteal and visualized deep calf veins Left lower extremity: No evidence for thrombus involving the common femoral, femoral, popliteal and visualized deep calf veins. Focal dilation of the distal femoral vein (10:967). IMPRESSION: 1. No evidence of deep venous thrombosis in the bilateral lower extremities. 2. Mild effacement of the proximal left common iliac vein, where it crosses the soft tissue mass at the level of L5-S1. 3. Slight interval increase in size of a hypoattenuating soft tissue centered within the right sacral ala with increased anterior presacral and posterior spinal canal soft tissue extension. 4. Similar severe right hydroureteronephrosis to the level of the distal ureter  adjacent to the right pelvic surgical clips and sacral mass. 5. Circumferential soft tissue edema of the legs, right-greater-than-left. 6.   Aortic Atherosclerosis (ICD10-I70.0). Electronically Signed   By: Limin  Xu M.D.   On: 02/15/2024 16:58   VAS US  LOWER EXTREMITY VENOUS (DVT) Result Date: 02/01/2024  Lower Venous DVT Study Patient Name:  Regina Young  Date of Exam:   02/01/2024 Medical Rec #: [id number]      Accession #:    [id number] Date of Birth: 11/28/1955      Patient Gender: F Patient Age:   41 years Exam Location:  [address] Procedure:      VAS US  LOWER EXTREMITY VENOUS (DVT) Referring Phys: Retta Caster --------------------------------------------------------------------------------  Indications: Edema.  Performing Technologist: Aloma Arrant RVS  Examination Guidelines: A complete evaluation includes B-mode imaging, spectral Doppler, color Doppler, and power Doppler as needed of all accessible portions of each vessel. Bilateral testing is considered an integral part of a complete examination. Limited examinations for reoccurring indications may be performed as noted. The reflux portion of the exam is performed with the patient in reverse Trendelenburg.  +--------+---------------+---------+-----------+----------+--------------------+ RIGHT   CompressibilityPhasicitySpontaneityPropertiesThrombus Aging       +--------+---------------+---------+-----------+----------+--------------------+ CFV     Full           Yes      Yes                                       +--------+---------------+---------+-----------+----------+--------------------+ SFJ     Full                                                              +--------+---------------+---------+-----------+----------+--------------------+ FV Prox Full                                                              +--------+---------------+---------+-----------+----------+--------------------+ FV Mid  Full                                                              +--------+---------------+---------+-----------+----------+--------------------+ FV       Full                                                              Distal                                                                    +--------+---------------+---------+-----------+----------+--------------------+  PFV     Full                                                              +--------+---------------+---------+-----------+----------+--------------------+ POP     Full           Yes      Yes                  in a single paired                                                        vein                 +--------+---------------+---------+-----------+----------+--------------------+ PTV     Partial                                                           +--------+---------------+---------+-----------+----------+--------------------+ PERO    Full                                                              +--------+---------------+---------+-----------+----------+--------------------+   +----+---------------+---------+-----------+----------+--------------+ LEFTCompressibilityPhasicitySpontaneityPropertiesThrombus Aging +----+---------------+---------+-----------+----------+--------------+ CFV Full           Yes      Yes                                 +----+---------------+---------+-----------+----------+--------------+     Summary: RIGHT: - Findings consistent with age indeterminate deep vein thrombosis involving the right posterior tibial veins.  - No cystic structure found in the popliteal fossa.  LEFT: - No evidence of common femoral vein obstruction.   *See table(s) above for measurements and observations. Electronically signed by Runell Countryman on 02/01/2024 at 4:46:20 PM.    Final     Labs:  CBC: Recent Labs    01/10/24 1125 01/28/24 0810 02/15/24 1449 02/18/24 1350  WBC 12.8* 13.5* 9.5 11.7*  HGB 12.2 11.6* 11.2* 11.7*  HCT 37.5 36.6 34.5* 35.9*  PLT 333 380 132* 166    COAGS: Recent Labs    12/23/23 1117   INR 1.1    BMP: Recent Labs    01/10/24 1125 01/28/24 0810 02/15/24 1449 02/18/24 1350  NA 140 141 138 138  K 4.7 4.7 4.0 4.1  CL 103 102 102 98  CO2 30 32 25 30  GLUCOSE 99 87 118* 105*  BUN 23 29* 34* 44*  CALCIUM  9.9 9.4 8.6* 9.5  CREATININE 0.95 0.89 0.80 0.97  GFRNONAA >60 >60 >60 >60    LIVER FUNCTION TESTS: Recent Labs    01/10/24 1125 01/28/24 0810 02/15/24 1449 02/18/24 1350  BILITOT 0.4 0.3 0.5 0.5  AST 13* 20 31 17  ALT 12 53* 41 37  ALKPHOS 73 91 57 53  PROT 6.9 6.2* 6.2* 6.4*  ALBUMIN 4.5 4.1 3.4* 4.1    TUMOR MARKERS: No results for input(s): AFPTM, CEA, CA199, CHROMGRNA in the last 8760 hours.  Assessment and Plan: Patient with past medical history of stage I colon cancer s/p resection, HLD presents with complaint of recently diagnosed non-small cell lung cancer with metastasis.  IR consulted for Port-A-Cath placement at the request of Dr Maria Shiner. Case reviewed by Dr. Darylene Epley who approves patient for procedure.  Patient presents today in their usual state of health.  She has been NPO and is on Eliquis  for recent DVT.   Risks and benefits of image guided port-a-catheter placement was discussed with the patient including, but not limited to bleeding, infection, pneumothorax, or fibrin sheath development and need for additional procedures.  All of the patient's questions were answered, patient is agreeable to proceed. Consent signed and in chart.  Advance Care Plan: The advanced care plan/surrogate decision maker was discussed at the time of visit and documented in the medical record.     Thank you for this interesting consult.  I greatly enjoyed meeting Regina Young and look forward to participating in their care.  A copy of this report was sent to the requesting provider on this date.  Electronically Signed: Breezy Hertenstein Sue-Ellen Jakarie Pember, PA 02/22/2024, 10:53 AM   I spent a total of  30 Minutes   in face to face in clinical  consultation, greater than 50% of which was counseling/coordinating care for poor venous access, metastatic lung cancer.

## 2024-02-22 NOTE — Procedures (Signed)
 Vascular and Interventional Radiology Procedure Note  Patient: Regina Young DOB: 1956/04/15 Medical Record Number: [id number] Note Date/Time: 02/22/24 11:16 AM   Performing Physician: Art Largo, MD Assistant(s): None  Diagnosis: Lung cancer  Procedure: PORT PLACEMENT  Anesthesia: Conscious Sedation Complications: None Estimated Blood Loss: Minimal  Findings:  Successful right-sided port placement, with the tip of the catheter in the proximal right atrium.  Plan: Catheter ready for use.  See detailed procedure note with images in PACS. The patient tolerated the procedure well without incident or complication and was returned to Recovery in stable condition.    Art Largo, MD [facility]   Pager. [phone] Clinic. [phone]

## 2024-02-22 NOTE — Progress Notes (Unsigned)
 Pharmacist Chemotherapy Monitoring - Initial Assessment    Anticipated start date: 02/29/24   The following has been reviewed per standard work regarding the patient's treatment regimen: The patient's diagnosis, treatment plan and drug doses, and organ/hematologic function Lab orders and baseline tests specific to treatment regimen  The treatment plan start date, drug sequencing, and pre-medications Prior authorization status  Patient's documented medication list, including drug-drug interaction screen and prescriptions for anti-emetics and supportive care specific to the treatment regimen The drug concentrations, fluid compatibility, administration routes, and timing of the medications to be used The patient's access for treatment and lifetime cumulative dose history, if applicable  The patient's medication allergies and previous infusion related reactions, if applicable   Changes made to treatment plan:  N/A  Follow up needed:  N/A   Kathlean Cinco Michelle, [facility], 02/22/2024  8:13 AM

## 2024-02-24 ENCOUNTER — Ambulatory Visit: Admitting: Emergency Medicine

## 2024-02-25 ENCOUNTER — Other Ambulatory Visit: Payer: Self-pay | Admitting: *Deleted

## 2024-02-25 MED ORDER — NYSTATIN 100000 UNIT/ML MT SUSP: 5.0000 mL | Freq: Four times a day (QID) | OROMUCOSAL | 0 refills | Status: DC

## 2024-02-28 ENCOUNTER — Other Ambulatory Visit: Payer: Self-pay

## 2024-02-28 ENCOUNTER — Telehealth: Payer: Self-pay | Admitting: *Deleted

## 2024-02-28 ENCOUNTER — Emergency Department (HOSPITAL_COMMUNITY)

## 2024-02-28 ENCOUNTER — Encounter (HOSPITAL_COMMUNITY): Payer: Self-pay

## 2024-02-28 ENCOUNTER — Inpatient Hospital Stay (HOSPITAL_COMMUNITY)
Admission: EM | Admit: 2024-02-28 | Discharge: 2024-03-10 | DRG: 193 | Disposition: A | Discharge status: Home / Self Care | Attending: Internal Medicine | Admitting: Internal Medicine

## 2024-02-28 DIAGNOSIS — J90 Pleural effusion, not elsewhere classified: Secondary | ICD-10-CM | POA: Diagnosis not present

## 2024-02-28 DIAGNOSIS — D649 Anemia, unspecified: Secondary | ICD-10-CM | POA: Diagnosis not present

## 2024-02-28 DIAGNOSIS — R911 Solitary pulmonary nodule: Secondary | ICD-10-CM | POA: Diagnosis present

## 2024-02-28 DIAGNOSIS — Z86718 Personal history of other venous thrombosis and embolism: Secondary | ICD-10-CM

## 2024-02-28 DIAGNOSIS — R5381 Other malaise: Secondary | ICD-10-CM | POA: Diagnosis present

## 2024-02-28 DIAGNOSIS — N1330 Unspecified hydronephrosis: Secondary | ICD-10-CM | POA: Diagnosis not present

## 2024-02-28 DIAGNOSIS — E871 Hypo-osmolality and hyponatremia: Secondary | ICD-10-CM | POA: Diagnosis present

## 2024-02-28 DIAGNOSIS — I82441 Acute embolism and thrombosis of right tibial vein: Secondary | ICD-10-CM | POA: Diagnosis not present

## 2024-02-28 DIAGNOSIS — E861 Hypovolemia: Secondary | ICD-10-CM | POA: Diagnosis not present

## 2024-02-28 DIAGNOSIS — D7389 Other diseases of spleen: Secondary | ICD-10-CM | POA: Diagnosis not present

## 2024-02-28 DIAGNOSIS — R531 Weakness: Secondary | ICD-10-CM | POA: Diagnosis not present

## 2024-02-28 DIAGNOSIS — R7401 Elevation of levels of liver transaminase levels: Secondary | ICD-10-CM | POA: Insufficient documentation

## 2024-02-28 DIAGNOSIS — Z923 Personal history of irradiation: Secondary | ICD-10-CM | POA: Diagnosis not present

## 2024-02-28 DIAGNOSIS — Z85048 Personal history of other malignant neoplasm of rectum, rectosigmoid junction, and anus: Secondary | ICD-10-CM | POA: Diagnosis not present

## 2024-02-28 DIAGNOSIS — C7951 Secondary malignant neoplasm of bone: Secondary | ICD-10-CM | POA: Diagnosis not present

## 2024-02-28 DIAGNOSIS — Y95 Nosocomial condition: Secondary | ICD-10-CM | POA: Diagnosis present

## 2024-02-28 DIAGNOSIS — R55 Syncope and collapse: Secondary | ICD-10-CM

## 2024-02-28 DIAGNOSIS — Z90710 Acquired absence of both cervix and uterus: Secondary | ICD-10-CM | POA: Diagnosis not present

## 2024-02-28 DIAGNOSIS — R509 Fever, unspecified: Secondary | ICD-10-CM | POA: Diagnosis not present

## 2024-02-28 DIAGNOSIS — Z79899 Other long term (current) drug therapy: Secondary | ICD-10-CM

## 2024-02-28 DIAGNOSIS — N281 Cyst of kidney, acquired: Secondary | ICD-10-CM | POA: Diagnosis not present

## 2024-02-28 DIAGNOSIS — Z7901 Long term (current) use of anticoagulants: Secondary | ICD-10-CM | POA: Diagnosis not present

## 2024-02-28 DIAGNOSIS — Z7952 Long term (current) use of systemic steroids: Secondary | ICD-10-CM

## 2024-02-28 DIAGNOSIS — R220 Localized swelling, mass and lump, head: Secondary | ICD-10-CM | POA: Diagnosis not present

## 2024-02-28 DIAGNOSIS — Z800 Family history of malignant neoplasm of digestive organs: Secondary | ICD-10-CM

## 2024-02-28 DIAGNOSIS — J9601 Acute respiratory failure with hypoxia: Principal | ICD-10-CM | POA: Diagnosis present

## 2024-02-28 DIAGNOSIS — E785 Hyperlipidemia, unspecified: Secondary | ICD-10-CM | POA: Diagnosis not present

## 2024-02-28 DIAGNOSIS — R519 Headache, unspecified: Secondary | ICD-10-CM | POA: Diagnosis not present

## 2024-02-28 DIAGNOSIS — K828 Other specified diseases of gallbladder: Secondary | ICD-10-CM | POA: Diagnosis not present

## 2024-02-28 DIAGNOSIS — E860 Dehydration: Secondary | ICD-10-CM | POA: Diagnosis not present

## 2024-02-28 DIAGNOSIS — Z87891 Personal history of nicotine dependence: Secondary | ICD-10-CM

## 2024-02-28 DIAGNOSIS — R109 Unspecified abdominal pain: Secondary | ICD-10-CM | POA: Diagnosis not present

## 2024-02-28 DIAGNOSIS — J189 Pneumonia, unspecified organism: Principal | ICD-10-CM | POA: Diagnosis present

## 2024-02-28 DIAGNOSIS — Z4682 Encounter for fitting and adjustment of non-vascular catheter: Secondary | ICD-10-CM | POA: Diagnosis not present

## 2024-02-28 DIAGNOSIS — R000 Tachycardia, unspecified: Secondary | ICD-10-CM | POA: Diagnosis not present

## 2024-02-28 DIAGNOSIS — Z1152 Encounter for screening for COVID-19: Secondary | ICD-10-CM | POA: Diagnosis not present

## 2024-02-28 DIAGNOSIS — K5900 Constipation, unspecified: Secondary | ICD-10-CM | POA: Diagnosis present

## 2024-02-28 DIAGNOSIS — R110 Nausea: Secondary | ICD-10-CM | POA: Diagnosis not present

## 2024-02-28 DIAGNOSIS — J984 Other disorders of lung: Secondary | ICD-10-CM | POA: Diagnosis not present

## 2024-02-28 DIAGNOSIS — C786 Secondary malignant neoplasm of retroperitoneum and peritoneum: Secondary | ICD-10-CM | POA: Diagnosis present

## 2024-02-28 DIAGNOSIS — K7689 Other specified diseases of liver: Secondary | ICD-10-CM | POA: Diagnosis not present

## 2024-02-28 DIAGNOSIS — R918 Other nonspecific abnormal finding of lung field: Secondary | ICD-10-CM | POA: Diagnosis not present

## 2024-02-28 DIAGNOSIS — I82621 Acute embolism and thrombosis of deep veins of right upper extremity: Secondary | ICD-10-CM | POA: Diagnosis not present

## 2024-02-28 DIAGNOSIS — M5410 Radiculopathy, site unspecified: Secondary | ICD-10-CM | POA: Diagnosis present

## 2024-02-28 DIAGNOSIS — Z85038 Personal history of other malignant neoplasm of large intestine: Secondary | ICD-10-CM

## 2024-02-28 DIAGNOSIS — K219 Gastro-esophageal reflux disease without esophagitis: Secondary | ICD-10-CM | POA: Diagnosis present

## 2024-02-28 DIAGNOSIS — I82409 Acute embolism and thrombosis of unspecified deep veins of unspecified lower extremity: Secondary | ICD-10-CM | POA: Insufficient documentation

## 2024-02-28 DIAGNOSIS — Z79891 Long term (current) use of opiate analgesic: Secondary | ICD-10-CM

## 2024-02-28 DIAGNOSIS — C3491 Malignant neoplasm of unspecified part of right bronchus or lung: Secondary | ICD-10-CM | POA: Diagnosis not present

## 2024-02-28 DIAGNOSIS — E876 Hypokalemia: Secondary | ICD-10-CM | POA: Diagnosis not present

## 2024-02-28 DIAGNOSIS — A419 Sepsis, unspecified organism: Secondary | ICD-10-CM | POA: Diagnosis not present

## 2024-02-28 DIAGNOSIS — R10817 Generalized abdominal tenderness: Secondary | ICD-10-CM

## 2024-02-28 DIAGNOSIS — R7989 Other specified abnormal findings of blood chemistry: Secondary | ICD-10-CM | POA: Diagnosis not present

## 2024-02-28 DIAGNOSIS — R590 Localized enlarged lymph nodes: Secondary | ICD-10-CM | POA: Diagnosis not present

## 2024-02-28 LAB — COMPREHENSIVE METABOLIC PANEL WITH GFR
ALT: 59 U/L — ABNORMAL HIGH (ref 0–44)
AST: 34 U/L (ref 15–41)
Albumin: 2.7 g/dL — ABNORMAL LOW (ref 3.5–5.0)
Alkaline Phosphatase: 66 U/L (ref 38–126)
Anion gap: 12 (ref 5–15)
BUN: 21 mg/dL (ref 8–23)
CO2: 24 mmol/L (ref 22–32)
Calcium: 7.8 mg/dL — ABNORMAL LOW (ref 8.9–10.3)
Chloride: 94 mmol/L — ABNORMAL LOW (ref 98–111)
Creatinine, Ser: 0.94 mg/dL (ref 0.44–1.00)
GFR, Estimated: >60 mL/min (ref >60)
Glucose, Bld: 87 mg/dL (ref 70–99)
Potassium: 3.7 mmol/L (ref 3.5–5.1)
Sodium: 130 mmol/L — ABNORMAL LOW (ref 135–145)
Total Bilirubin: 0.8 mg/dL (ref 0.0–1.2)
Total Protein: 5.9 g/dL — ABNORMAL LOW (ref 6.5–8.1)

## 2024-02-28 LAB — URINALYSIS, W/ REFLEX TO CULTURE (INFECTION SUSPECTED)
Bacteria, UA: NONE SEEN
Bilirubin Urine: NEGATIVE
Glucose, UA: NEGATIVE mg/dL
Hgb urine dipstick: NEGATIVE
Ketones, ur: NEGATIVE mg/dL
Leukocytes,Ua: NEGATIVE
Nitrite: NEGATIVE
Protein, ur: NEGATIVE mg/dL
Specific Gravity, Urine: 1.01 (ref 1.005–1.030)
pH: 8 (ref 5.0–8.0)

## 2024-02-28 LAB — PROTIME-INR
INR: 1.4 — ABNORMAL HIGH (ref 0.8–1.2)
Prothrombin Time: 17.3 s — ABNORMAL HIGH (ref 11.4–15.2)

## 2024-02-28 LAB — CBC WITH DIFFERENTIAL/PLATELET
Abs Immature Granulocytes: 0.2 10*3/uL — ABNORMAL HIGH (ref 0.00–0.07)
Basophils Absolute: 0 10*3/uL (ref 0.0–0.1)
Basophils Relative: 0 %
Eosinophils Absolute: 0 10*3/uL (ref 0.0–0.5)
Eosinophils Relative: 0 %
HCT: 34.3 % — ABNORMAL LOW (ref 36.0–46.0)
Hemoglobin: 10.9 g/dL — ABNORMAL LOW (ref 12.0–15.0)
Immature Granulocytes: 3 %
Lymphocytes Relative: 2 %
Lymphs Abs: 0.2 10*3/uL — ABNORMAL LOW (ref 0.7–4.0)
MCH: 29.5 pg (ref 26.0–34.0)
MCHC: 31.8 g/dL (ref 30.0–36.0)
MCV: 92.7 fL (ref 80.0–100.0)
Monocytes Absolute: 0.3 10*3/uL (ref 0.1–1.0)
Monocytes Relative: 4 %
Neutro Abs: 6.6 10*3/uL (ref 1.7–7.7)
Neutrophils Relative %: 91 %
Platelets: 235 10*3/uL (ref 150–400)
RBC: 3.7 MIL/uL — ABNORMAL LOW (ref 3.87–5.11)
RDW: 15.9 % — ABNORMAL HIGH (ref 11.5–15.5)
WBC: 7.3 10*3/uL (ref 4.0–10.5)
nRBC: 0 % (ref 0.0–0.2)

## 2024-02-28 LAB — LIPASE, BLOOD: Lipase: 25 U/L (ref 11–51)

## 2024-02-28 LAB — TROPONIN I (HIGH SENSITIVITY)
Troponin I (High Sensitivity): 9 ng/L (ref <18)
Troponin I (High Sensitivity): 8 ng/L (ref <18)

## 2024-02-28 LAB — RESP PANEL BY RT-PCR (RSV, FLU A&B, COVID)  RVPGX2
Influenza A by PCR: NEGATIVE
Influenza B by PCR: NEGATIVE
Resp Syncytial Virus by PCR: NEGATIVE
SARS Coronavirus 2 by RT PCR: NEGATIVE

## 2024-02-28 LAB — I-STAT CG4 LACTIC ACID, ED: Lactic Acid, Venous: 0.8 mmol/L (ref 0.5–1.9)

## 2024-02-28 LAB — BRAIN NATRIURETIC PEPTIDE: B Natriuretic Peptide: 47.7 pg/mL (ref 0.0–100.0)

## 2024-02-28 MED ORDER — IOHEXOL 300 MG/ML  SOLN
100.0000 mL | Freq: Once | INTRAMUSCULAR | Status: AC | PRN
  Administered 2024-02-28: 100 mL via INTRAVENOUS

## 2024-02-28 MED ORDER — LACTATED RINGERS IV SOLN: INTRAVENOUS | Status: AC

## 2024-02-28 MED ORDER — SODIUM CHLORIDE 0.9 % IV SOLN
2.0000 g | Freq: Once | INTRAVENOUS | Status: AC
  Administered 2024-02-28: 2 g via INTRAVENOUS
  Filled 2024-02-28: qty 12.5

## 2024-02-28 MED ORDER — OXYCODONE HCL 5 MG PO TABS
5.0000 mg | ORAL_TABLET | ORAL | Status: DC | PRN
  Administered 2024-02-29 – 2024-03-03 (×7): 5 mg via ORAL
  Filled 2024-02-28 (×7): qty 1

## 2024-02-28 MED ORDER — SODIUM CHLORIDE 0.9 % IV SOLN
1.0000 g | INTRAVENOUS | Status: DC
  Administered 2024-02-28: 1 g via INTRAVENOUS
  Filled 2024-02-28: qty 10

## 2024-02-28 MED ORDER — ONDANSETRON HCL 4 MG/2ML IJ SOLN
4.0000 mg | Freq: Once | INTRAMUSCULAR | Status: AC
  Administered 2024-02-28: 4 mg via INTRAVENOUS
  Filled 2024-02-28: qty 2

## 2024-02-28 MED ORDER — ACETAMINOPHEN 325 MG PO TABS
650.0000 mg | ORAL_TABLET | Freq: Once | ORAL | Status: AC
  Administered 2024-02-28: 650 mg via ORAL
  Filled 2024-02-28: qty 2

## 2024-02-28 MED ORDER — GABAPENTIN 300 MG PO CAPS
300.0000 mg | ORAL_CAPSULE | Freq: Three times a day (TID) | ORAL | Status: DC
  Administered 2024-02-28 – 2024-03-01 (×8): 300 mg via ORAL
  Filled 2024-02-28 (×8): qty 1

## 2024-02-28 MED ORDER — OMEPRAZOLE MAGNESIUM 20 MG PO TBEC: 20.0000 mg | DELAYED_RELEASE_TABLET | Freq: Every day | ORAL | Status: DC

## 2024-02-28 MED ORDER — SENNA 8.6 MG PO TABS
1.0000 | ORAL_TABLET | Freq: Every day | ORAL | Status: DC
  Administered 2024-02-28: 8.6 mg via ORAL
  Filled 2024-02-28: qty 1

## 2024-02-28 MED ORDER — ACETAMINOPHEN 650 MG RE SUPP: 650.0000 mg | Freq: Four times a day (QID) | RECTAL | Status: DC | PRN

## 2024-02-28 MED ORDER — ACETAMINOPHEN 325 MG PO TABS
650.0000 mg | ORAL_TABLET | Freq: Four times a day (QID) | ORAL | Status: DC | PRN
  Administered 2024-02-29 – 2024-03-06 (×15): 650 mg via ORAL
  Filled 2024-02-28 (×15): qty 2

## 2024-02-28 MED ORDER — APIXABAN 5 MG PO TABS
5.0000 mg | ORAL_TABLET | Freq: Two times a day (BID) | ORAL | Status: DC
  Administered 2024-02-28 – 2024-03-10 (×22): 5 mg via ORAL
  Filled 2024-02-28 (×22): qty 1

## 2024-02-28 MED ORDER — ALBUTEROL SULFATE (2.5 MG/3ML) 0.083% IN NEBU
2.5000 mg | INHALATION_SOLUTION | RESPIRATORY_TRACT | Status: DC | PRN
Start: 2024-02-28 — End: 2024-03-03

## 2024-02-28 MED ORDER — MORPHINE SULFATE ER 15 MG PO TBCR
15.0000 mg | EXTENDED_RELEASE_TABLET | Freq: Two times a day (BID) | ORAL | Status: DC
  Administered 2024-02-28 – 2024-03-10 (×22): 15 mg via ORAL
  Filled 2024-02-28 (×22): qty 1

## 2024-02-28 MED ORDER — LACTATED RINGERS IV BOLUS (SEPSIS)
1000.0000 mL | Freq: Once | INTRAVENOUS | Status: AC
  Administered 2024-02-28: 1000 mL via INTRAVENOUS

## 2024-02-28 MED ORDER — ONDANSETRON HCL 4 MG PO TABS: 4.0000 mg | ORAL_TABLET | Freq: Four times a day (QID) | ORAL | Status: DC | PRN

## 2024-02-28 MED ORDER — ZOLPIDEM TARTRATE 5 MG PO TABS: 5.0000 mg | ORAL_TABLET | Freq: Every evening | ORAL | Status: DC | PRN

## 2024-02-28 MED ORDER — POLYETHYLENE GLYCOL 3350 17 G PO PACK
17.0000 g | PACK | Freq: Every day | ORAL | Status: DC
  Filled 2024-02-28: qty 1

## 2024-02-28 MED ORDER — ONDANSETRON HCL 4 MG/2ML IJ SOLN
4.0000 mg | Freq: Four times a day (QID) | INTRAMUSCULAR | Status: DC | PRN
  Administered 2024-02-28: 4 mg via INTRAVENOUS
  Filled 2024-02-28: qty 2

## 2024-02-28 MED ORDER — VANCOMYCIN HCL IN DEXTROSE 1-5 GM/200ML-% IV SOLN
1000.0000 mg | Freq: Once | INTRAVENOUS | Status: AC
  Administered 2024-02-28: 1000 mg via INTRAVENOUS
  Filled 2024-02-28: qty 200

## 2024-02-28 MED ORDER — METRONIDAZOLE 500 MG/100ML IV SOLN
500.0000 mg | Freq: Once | INTRAVENOUS | Status: AC
  Administered 2024-02-28: 500 mg via INTRAVENOUS
  Filled 2024-02-28: qty 100

## 2024-02-28 MED ORDER — PANTOPRAZOLE SODIUM 40 MG PO TBEC
40.0000 mg | DELAYED_RELEASE_TABLET | Freq: Every day | ORAL | Status: DC
  Administered 2024-02-29 – 2024-03-10 (×11): 40 mg via ORAL
  Filled 2024-02-28 (×11): qty 1

## 2024-02-28 MED ORDER — SODIUM CHLORIDE 0.9 % IV SOLN
500.0000 mg | INTRAVENOUS | Status: DC
  Administered 2024-02-28 – 2024-03-03 (×5): 500 mg via INTRAVENOUS
  Filled 2024-02-28 (×7): qty 5

## 2024-02-28 NOTE — Telephone Encounter (Signed)
 Received a call from Regina Young, patients husband stating that she has excruciating pain in her right upper leg.  She is still in bed which is uncharacteristic of her.  She has her eyes closed but is conversing with her husband.  Appears to be oriented.  Temperature is 102.1.  The highest has been 103.1.  Dr Timmy notified.  Wants Regina Young to bring her to the ED to be evaluated.  Regina Young is receptive to this and understands instructions.

## 2024-02-28 NOTE — H&P (Signed)
 History and Physical  Regina Young FMW:[id number] DOB: 1956/04/12 DOA: 02/28/2024  PCP: Dwight Trula SQUIBB, MD   Chief Complaint: Fever  HPI: Regina Young is a 68 y.o. female with medical history significant for metastatic lung cancer, DVT on Eliquis , GERD, hyperlipidemia being admitted to the hospital with acute hypoxic respiratory failure due to community-acquired pneumonia.  History is provided by the patient as well as her sister who is at the bedside, they state that her fever was as high as 102.9 this morning.  She has some associated nausea, headache and right leg pain which has been bothering her for a few weeks.  She denies any cough, or chest pain.  She has been compliant with her Eliquis .  She recently completed radiotherapy, and is starting chemotherapy soon.  Review of Systems: Please see HPI for pertinent positives and negatives. A complete 10 system review of systems are otherwise negative.  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) 01/10/2024   Past Surgical History:  Procedure Laterality Date   ABDOMINAL HYSTERECTOMY  1999   Rolan March, MD   BIOPSY  01/01/2021   Procedure: BIOPSY;  Surgeon: Debby Hila, MD;  Location: [facility] ENDOSCOPY;  Service: Endoscopy;;   BRONCHIAL NEEDLE ASPIRATION BIOPSY  01/04/2024   Procedure: BRONCHOSCOPY, WITH NEEDLE ASPIRATION BIOPSY;  Surgeon: Shelah Lamar RAMAN, MD;  Location: [facility] ENDOSCOPY;  Service: Pulmonary;;   ENDOBRONCHIAL ULTRASOUND Right 01/04/2024   Procedure: ENDOBRONCHIAL ULTRASOUND (EBUS);  Surgeon: Shelah Lamar RAMAN, MD;  Location: [facility] ENDOSCOPY;  Service: Pulmonary;  Laterality: Right;   FLEXIBLE SIGMOIDOSCOPY N/A 01/01/2021   Procedure: FLEXIBLE SIGMOIDOSCOPY WITH  BIOPSY;  Surgeon: Debby Hila, MD;  Location: [facility] ENDOSCOPY;  Service: Endoscopy;  Laterality: N/A;  IV SEDATION BY SURGEON   IR IMAGING GUIDED PORT INSERTION  02/22/2024   XI ROBOTIC ASSISTED LOWER ANTERIOR RESECTION N/A 02/21/2021   Procedure: XI ROBOTIC ASSISTED LOWER ANTERIOR RESECTION;  Surgeon: Debby Hila, MD;  Location: [facility] ORS;  Service: General;  Laterality: N/A;   Social History:  reports that she quit smoking about 7 weeks ago. Her smoking use included cigarettes. She has a 12.5 pack-year smoking history. She has been exposed to tobacco smoke. She has never used smokeless tobacco. She reports current alcohol use. She reports that she does not use drugs.  No Known Allergies  Family History  Problem Relation Age of Onset   Liver cancer Mother      Prior to Admission medications   Medication Sig Start Date End Date Taking? Authorizing Provider  acetaminophen  (TYLENOL ) 500 MG tablet Take 500-1,000 mg by mouth every 6 (six) hours as needed (PAIN). Patient not taking: Reported on 02/18/2024    [provider]  alendronate (FOSAMAX) 70 MG tablet Take 70 mg by mouth See admin instructions. Take 70 mg every Sunday by mouth 30 minutes before the first food, beverage or medicine of the day with plain water 04/07/21   [provider]  apixaban  (ELIQUIS ) 5 MG TABS tablet Take 1 tablet (5 mg total) by mouth 2 (two) times daily. 02/21/24   Timmy Maude SAUNDERS, MD  Calcium  Citrate 250 MG TABS Take 250 mg by mouth in the morning.    [provider]  Cholecalciferol (VITAMIN D3) 50 MCG (2000  UT) TABS Take 2,000 Units by mouth daily with breakfast.    [provider]  cyanocobalamin  (VITAMIN B12) 1000 MCG tablet Take 1,000 mcg by mouth daily.    [provider]  dexamethasone  (DECADRON ) 4 MG tablet Take Decadron  20 mg (five tablets) today and then take Decadron  12 mg (three tablets) daily.  Take in the AM with food. 02/18/24   Timmy Maude SAUNDERS, MD  furosemide   (LASIX ) 20 MG tablet Take 1 tablet (20 mg total) by mouth daily for 5 days. 02/15/24 02/22/24  Elnor Jayson LABOR, DO  gabapentin  (NEURONTIN ) 300 MG capsule Take 1 capsule (300 mg total) by mouth 3 (three) times daily. 02/21/24 04/21/24  Timmy Maude SAUNDERS, MD  morphine  (MS CONTIN ) 15 MG 12 hr tablet Take 1 tablet (15 mg total) by mouth every 12 (twelve) hours. 01/28/24   Timmy Maude SAUNDERS, MD  nystatin  (MYCOSTATIN ) 100000 UNIT/ML suspension Take 5 mLs (500,000 Units total) by mouth 4 (four) times daily. 02/25/24   Timmy Maude SAUNDERS, MD  omeprazole (PRILOSEC OTC) 20 MG tablet Take 20 mg by mouth daily before breakfast.    [provider]  oxyCODONE  (OXY IR/ROXICODONE ) 5 MG immediate release tablet Take one to two tablets every six hours as needed for pain 02/21/24   Timmy Maude SAUNDERS, MD  rosuvastatin  (CRESTOR ) 5 MG tablet Take 5 mg by mouth in the morning. 11/24/20   [provider]    Physical Exam: BP 135/74   Pulse 93   Temp 98.4 F (36.9 C) (Oral)   Resp 16   SpO2 94%  General:  Alert, oriented, calm, in no acute distress, resting comfortably on 3 L nasal cannula oxygen.  Notably her oxygenation improved significantly when she was asked to breathe with her mouth closed. Eyes: EOMI, clear conjuctivae, white sclerea Neck: supple, no masses, trachea mildline  Cardiovascular: RRR, no murmurs or rubs, no peripheral edema  Respiratory: clear to auscultation bilaterally, no wheezes, no crackles  Abdomen: soft, nontender, nondistended, normal bowel tones heard  Skin: dry, no rashes  Musculoskeletal: no joint effusions, normal range of motion  Psychiatric: appropriate affect, normal speech  Neurologic: extraocular muscles intact, clear speech, moving all extremities with intact sensorium         Labs on Admission:  Basic Metabolic Panel: Recent Labs  Lab 02/28/24 1132  NA 130*  K 3.7  CL 94*  CO2 24  GLUCOSE 87  BUN 21  CREATININE 0.94  CALCIUM  7.8*   Liver Function  Tests: Recent Labs  Lab 02/28/24 1132  AST 34  ALT 59*  ALKPHOS 66  BILITOT 0.8  PROT 5.9*  ALBUMIN 2.7*   Recent Labs  Lab 02/28/24 1446  LIPASE 25   No results for input(s): AMMONIA in the last 168 hours. CBC: Recent Labs  Lab 02/28/24 1132  WBC 7.3  NEUTROABS 6.6  HGB 10.9*  HCT 34.3*  MCV 92.7  PLT 235   Cardiac Enzymes: No results for input(s): CKTOTAL, CKMB, CKMBINDEX, TROPONINI in the last 168 hours. BNP (last 3 results) Recent Labs    02/15/24 1449 02/28/24 1132  BNP 46.9 47.7    ProBNP (last 3 results) No results for input(s): PROBNP in the last 8760 hours.  CBG: No results for input(s): GLUCAP in the last 168 hours.  Radiological Exams on Admission: CT Head Wo Contrast Result Date: 02/28/2024 CLINICAL DATA:  Headache and right leg pain. EXAM: CT HEAD WITHOUT CONTRAST TECHNIQUE: Contiguous axial images were obtained from the base of  the skull through the vertex without intravenous contrast. RADIATION DOSE REDUCTION: This exam was performed according to the departmental dose-optimization program which includes automated exposure control, adjustment of the mA and/or kV according to patient size and/or use of iterative reconstruction technique. COMPARISON:  December 22, 2023 FINDINGS: Brain: No evidence of acute infarction, hemorrhage, hydrocephalus, extra-axial collection or mass lesion/mass effect. Vascular: No hyperdense vessel or unexpected calcification. Skull: Normal. Negative for fracture or focal lesion. Sinuses/Orbits: No acute finding. Other: A small, stable partially calcified left posterior parietal scalp soft tissue nodule is seen. IMPRESSION: No acute intracranial abnormality. Electronically Signed   By: Suzen Dials M.D.   On: 02/28/2024 16:15   CT ABDOMEN PELVIS W CONTRAST Result Date: 02/28/2024 CLINICAL DATA:  History of rectal cancer.  Abdominal pain and fever. EXAM: CT ABDOMEN AND PELVIS WITH CONTRAST TECHNIQUE: Multidetector  CT imaging of the abdomen and pelvis was performed using the standard protocol following bolus administration of intravenous contrast. RADIATION DOSE REDUCTION: This exam was performed according to the departmental dose-optimization program which includes automated exposure control, adjustment of the mA and/or kV according to patient size and/or use of iterative reconstruction technique. CONTRAST:  OMNIPAQUE  IOHEXOL  300 MG/ML  SOLN COMPARISON:  CT venogram abdomen and pelvis 02/15/2024. FINDINGS: Lower chest: No acute abnormality. Hepatobiliary: There are scattered rounded hypodensities throughout the liver favored as cysts. These measure up to 19 mm and appear unchanged from the prior study. No new liver lesions are seen. Gallbladder and bile ducts are within normal limits. Pancreas: Unremarkable. No pancreatic ductal dilatation or surrounding inflammatory changes. Spleen: Rounded hypodensities in the spleen are unchanged, possibly cysts or hemangiomas. Spleen is normal in size. Adrenals/Urinary Tract: There is stable severe right-sided hydroureteronephrosis to the level of the pelvic inlet/surgical staples. The left kidney, adrenal glands and bladder are within normal limits. The bladder is markedly distended. Stomach/Bowel: There is no bowel obstruction, focal wall thickening, inflammation or free air identified. The appendix is not visualized. Rectosigmoid anastomosis is present. There is a large amount of stool throughout the entire colon the stomach is within normal limits. Vascular/Lymphatic: Aortic atherosclerosis. No enlarged abdominal or pelvic lymph nodes. Reproductive: Status post hysterectomy. No adnexal masses. Other: There is no ascites or focal abdominal wall hernia. Musculoskeletal: There is a stable soft tissue mass with osseous involvement the level of the right L5-S1 anteriorly. This measures approximally 5.5 x 2.7 cm. There is new pathologic fracture along the right superior endplate of  S1. IMPRESSION: 1. New pathologic fracture along the right superior endplate of S1. 2. Stable soft tissue mass with osseous involvement at the level of the right L5-S1 disc space. 3. Stable severe right-sided hydroureteronephrosis to the level of the pelvic inlet/surgical staples. 4. Marked distention of the bladder. 5. Large amount of stool throughout the colon. 6. Aortic atherosclerosis. Aortic Atherosclerosis (ICD10-I70.0). Electronically Signed   By: Greig Pique M.D.   On: 02/28/2024 16:03   DG Chest Port 1 View Result Date: 02/28/2024 CLINICAL DATA:  Sepsis. EXAM: PORTABLE CHEST 1 VIEW COMPARISON:  Chest radiograph dated 01/04/2024. FINDINGS: Right-sided Port-A-Cath with tip close to the cavoatrial junction. Bilateral upper lobe opacities, new since the prior radiograph and may represent infiltrate or edema. No pleural effusion or pneumothorax. Stable cardiac silhouette. No acute osseous pathology. IMPRESSION: Bilateral upper lobe opacities, new since the prior radiograph and may represent infiltrate or edema. Electronically Signed   By: Vanetta Chou M.D.   On: 02/28/2024 12:54   Assessment/Plan  Regina Young  is a 68 y.o. female with medical history significant for metastatic lung cancer, DVT on Eliquis , GERD, hyperlipidemia being admitted to the hospital with acute hypoxic respiratory failure due to community-acquired pneumonia.   Acute hypoxic respiratory failure-likely due to community-acquired pneumonia, patient has been compliant with Eliquis .  Chest x-ray without evidence of worsening mass, effusion or other abnormality. -Inpatient admission -Supplemental oxygen, wean as tolerated, I do not think she is truly requiring 3 L -Treat community-acquired pneumonia as below  Community-acquired pneumonia-chest radiograph with bilateral upper lobe opacities, no evidence of sepsis. -Empiric IV azithromycin and IV Rocephin  Hyponatremia-mild and asymptomatic, presumably due to relative  dehydration in the setting of acute infection -LR infusion -Monitor sodium levels with daily labs  DVT-continue Eliquis   Lung cancer with metastasis-under the care of Dr. Timmy and recently started chemotherapy -Resume home dose MS Contin , and oxycodone  as needed for breakthrough pain  Elevated ALT-this is minimal, likely related to acute infection being treated as above -Trend with daily labs    Code Status: Full Code  Consults called: None, her oncologist Dr. Timmy was added to inpatient treatment team.  Admission status: The appropriate patient status for this patient is INPATIENT. Inpatient status is judged to be reasonable and necessary in order to provide the required intensity of service to ensure the patient's safety. The patient's presenting symptoms, physical exam findings, and initial radiographic and laboratory data in the context of their chronic comorbidities is felt to place them at high risk for further clinical deterioration. Furthermore, it is not anticipated that the patient will be medically stable for discharge from the hospital within 2 midnights of admission.    I certify that at the point of admission it is my clinical judgment that the patient will require inpatient hospital care spanning beyond 2 midnights from the point of admission due to high intensity of service, high risk for further deterioration and high frequency of surveillance required  Time spent: 59 minutes  Nalah Macioce CHRISTELLA Gail MD [facility] Pager [phone]  If 7PM-7AM, please contact night-coverage [email or web address] Password [email or web address]  02/28/2024, 4:59 PM

## 2024-02-28 NOTE — ED Provider Notes (Signed)
 [place] EMERGENCY DEPARTMENT AT [facility] Provider Note   CSN: [id number] Arrival date & time: 02/28/24  1048     Patient presents with: Fever   Regina Young is a 68 y.o. female.   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  Reports that she had a fever this morning to 102.9.  Reports she had been doing okay recently.  Yesterday did complain of a sinus headache and had some right flank pain.  Has had ongoing right leg pain, thought to be radicular in the setting of metastases, also diagnosed with right sided DVT.  The pain has continued.  She has seemed not herself today, generally weak, low appetite, and found to have fever. She reports congestion but denies known cough or dyspnea. No CP. Denies dysuria.  Has had nausea, no diarrhea. Has had headache.  Did take her eliquis  and pain medication today.     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 Authorizing Provider  acetaminophen  (TYLENOL ) 500 MG tablet Take 500-1,000 mg by mouth every 6 (six) hours as needed (PAIN). Patient not taking: Reported on 02/18/2024    [provider]  alendronate (FOSAMAX) 70 MG tablet Take 70 mg by mouth See admin instructions. Take 70 mg every Sunday by mouth 30 minutes before the first food, beverage or medicine of the day with plain water 04/07/21   [provider]  apixaban  (ELIQUIS ) 5 MG TABS tablet Take 1 tablet (5 mg total) by mouth 2 (two) times daily. 02/21/24   Timmy Maude SAUNDERS, MD  Calcium  Citrate 250 MG TABS Take 250 mg by mouth in the morning.    [provider]  Cholecalciferol (VITAMIN D3) 50 MCG (2000 UT) TABS Take 2,000 Units by mouth daily with breakfast.    [provider]  cyanocobalamin  (VITAMIN B12) 1000 MCG tablet Take 1,000 mcg by mouth daily.    [provider]  dexamethasone  (DECADRON ) 4 MG tablet Take Decadron  20 mg (five tablets) today and then take Decadron  12 mg (three tablets) daily.  Take in the AM with food. 02/18/24   Timmy Maude SAUNDERS, MD  furosemide  (LASIX ) 20 MG tablet Take 1 tablet (20 mg total) by mouth daily for 5 days. 02/15/24 02/22/24  Elnor Savant A, DO  gabapentin  (NEURONTIN ) 300 MG capsule Take 1 capsule (300 mg total) by mouth 3 (three) times daily. 02/21/24 04/21/24  Timmy Maude SAUNDERS, MD  morphine  (MS CONTIN ) 15 MG 12 hr tablet Take 1 tablet (15 mg total) by mouth every 12 (twelve) hours. 01/28/24   Timmy Maude SAUNDERS, MD  nystatin  (MYCOSTATIN ) 100000 UNIT/ML suspension Take 5 mLs (500,000 Units total) by mouth 4 (four) times daily. 02/25/24   Timmy Maude SAUNDERS, MD  omeprazole (PRILOSEC OTC) 20 MG tablet Take 20 mg by mouth daily before breakfast.    [provider]  oxyCODONE  (OXY IR/ROXICODONE )  5 MG immediate release tablet Take one to two tablets every six hours as needed for pain 02/21/24   Timmy Maude SAUNDERS, MD  rosuvastatin  (CRESTOR ) 5 MG tablet Take 5 mg by mouth in the morning. 11/24/20   [provider]    Allergies: Patient has no known allergies.    Review of Systems  Updated Vital Signs BP 130/73 (BP Location: Left Arm)   Pulse 98   Temp 98.7 F (37.1 C) (Oral)   Resp 18   Ht 5' 2 (1.575 m)   Wt 48.6 kg   SpO2 (!) 89%   BMI 19.60 kg/m   Physical Exam Vitals and nursing note reviewed.  Constitutional:      General: She is not in acute  distress.    Appearance: She is well-developed. She is ill-appearing and toxic-appearing. She is not diaphoretic.  HENT:     Head: Normocephalic and atraumatic.   Eyes:     Conjunctiva/sclera: Conjunctivae normal.    Cardiovascular:     Rate and Rhythm: Normal rate and regular rhythm.     Heart sounds: Normal heart sounds. No murmur heard.    No friction rub. No gallop.  Pulmonary:     Effort: Pulmonary effort is normal. No respiratory distress.     Breath sounds: Normal breath sounds. No wheezing or rales.  Abdominal:     General: There is distension.     Palpations: Abdomen is soft.     Tenderness: There is abdominal tenderness (diffuse). There is no guarding.   Musculoskeletal:        General: No tenderness.     Cervical back: Normal range of motion.   Skin:    General: Skin is warm and dry.     Findings: No erythema or rash.   Neurological:     Mental Status: She is alert and oriented to person, place, and time.     (all labs ordered are listed, but only abnormal results are displayed) Labs Reviewed  COMPREHENSIVE METABOLIC PANEL WITH GFR - Abnormal; Notable for the following components:      Result Value   Sodium 130 (*)    Chloride 94 (*)    Calcium  7.8 (*)    Total Protein 5.9 (*)    Albumin 2.7 (*)    ALT 59 (*)    All other components within normal limits  CBC WITH DIFFERENTIAL/PLATELET - Abnormal; Notable for the following components:   RBC 3.70 (*)    Hemoglobin 10.9 (*)    HCT 34.3 (*)    RDW 15.9 (*)    Lymphs Abs 0.2 (*)    Abs Immature Granulocytes 0.20 (*)    All other components within normal limits  PROTIME-INR - Abnormal; Notable for the following components:   Prothrombin Time 17.3 (*)    INR 1.4 (*)    All other components within normal limits  URINALYSIS, W/ REFLEX TO CULTURE (INFECTION SUSPECTED) - Abnormal; Notable for the following components:   Color, Urine STRAW (*)    All other components within normal limits  RESP PANEL BY  RT-PCR (RSV, FLU A&B, COVID)  RVPGX2  CULTURE, BLOOD (ROUTINE X 2)  CULTURE, BLOOD (ROUTINE X 2)  LIPASE, BLOOD  BRAIN NATRIURETIC PEPTIDE  COMPREHENSIVE METABOLIC PANEL WITH GFR  CBC  I-STAT CG4 LACTIC ACID, ED  TROPONIN I (HIGH SENSITIVITY)  TROPONIN I (HIGH SENSITIVITY)    EKG: EKG Interpretation Date/Time:  Monday February 28 2024 11:23:42 EDT Ventricular Rate:  99 PR Interval:  123 QRS Duration:  84 QT Interval:  357 QTC Calculation: 459 R Axis:   41  Text Interpretation: Sinus rhythm No significant change since last tracing Confirmed by Dreama Longs ([id number]) on 02/28/2024 1:54:06 PM  Radiology: CT Head Wo Contrast Result Date: 02/28/2024 CLINICAL DATA:  Headache and right leg pain. EXAM: CT HEAD WITHOUT CONTRAST TECHNIQUE: Contiguous axial images were obtained from the base of the skull through the vertex without intravenous contrast. RADIATION DOSE REDUCTION: This exam was performed according to the departmental dose-optimization program which includes automated exposure control, adjustment of the mA and/or kV according to patient size and/or use of iterative reconstruction technique. COMPARISON:  December 22, 2023 FINDINGS: Brain: No evidence of acute infarction, hemorrhage, hydrocephalus, extra-axial collection or mass lesion/mass effect. Vascular: No hyperdense vessel or unexpected calcification. Skull: Normal. Negative for fracture or focal lesion. Sinuses/Orbits: No acute finding. Other: A small, stable partially calcified left posterior parietal scalp soft tissue nodule is seen. IMPRESSION: No acute intracranial abnormality. Electronically Signed   By: Suzen Dials M.D.   On: 02/28/2024 16:15   CT ABDOMEN PELVIS W CONTRAST Result Date: 02/28/2024 CLINICAL DATA:  History of rectal cancer.  Abdominal pain and fever. EXAM: CT ABDOMEN AND PELVIS WITH CONTRAST TECHNIQUE: Multidetector CT imaging of the abdomen and pelvis was performed using the standard protocol following bolus  administration of intravenous contrast. RADIATION DOSE REDUCTION: This exam was performed according to the departmental dose-optimization program which includes automated exposure control, adjustment of the mA and/or kV according to patient size and/or use of iterative reconstruction technique. CONTRAST:  OMNIPAQUE  IOHEXOL  300 MG/ML  SOLN COMPARISON:  CT venogram abdomen and pelvis 02/15/2024. FINDINGS: Lower chest: No acute abnormality. Hepatobiliary: There are scattered rounded hypodensities throughout the liver favored as cysts. These measure up to 19 mm and appear unchanged from the prior study. No new liver lesions are seen. Gallbladder and bile ducts are within normal limits. Pancreas: Unremarkable. No pancreatic ductal dilatation or surrounding inflammatory changes. Spleen: Rounded hypodensities in the spleen are unchanged, possibly cysts or hemangiomas. Spleen is normal in size. Adrenals/Urinary Tract: There is stable severe right-sided hydroureteronephrosis to the level of the pelvic inlet/surgical staples. The left kidney, adrenal glands and bladder are within normal limits. The bladder is markedly distended. Stomach/Bowel: There is no bowel obstruction, focal wall thickening, inflammation or free air identified. The appendix is not visualized. Rectosigmoid anastomosis is present. There is a large amount of stool throughout the entire colon the stomach is within normal limits. Vascular/Lymphatic: Aortic atherosclerosis. No enlarged abdominal or pelvic lymph nodes. Reproductive: Status post hysterectomy. No adnexal masses. Other: There is no ascites or focal abdominal wall hernia. Musculoskeletal: There is a stable soft tissue mass with osseous involvement the level of the right L5-S1 anteriorly. This measures approximally 5.5 x 2.7 cm. There is new pathologic fracture along the right superior endplate of S1. IMPRESSION: 1. New pathologic fracture along the right superior endplate of S1. 2. Stable  soft tissue mass with osseous involvement at the level of the right L5-S1 disc space. 3. Stable severe right-sided hydroureteronephrosis to the level of the pelvic inlet/surgical staples. 4. Marked distention of the bladder. 5. Large amount of stool throughout the colon. 6. Aortic atherosclerosis. Aortic Atherosclerosis (ICD10-I70.0). Electronically Signed   By: Greig Pique M.D.   On: 02/28/2024 16:03   DG Chest Port 1 View Result Date: 02/28/2024 CLINICAL DATA:  Sepsis. EXAM: PORTABLE CHEST 1 VIEW COMPARISON:  Chest radiograph dated 01/04/2024. FINDINGS: Right-sided Port-A-Cath with  tip close to the cavoatrial junction. Bilateral upper lobe opacities, new since the prior radiograph and may represent infiltrate or edema. No pleural effusion or pneumothorax. Stable cardiac silhouette. No acute osseous pathology. IMPRESSION: Bilateral upper lobe opacities, new since the prior radiograph and may represent infiltrate or edema. Electronically Signed   By: Vanetta Chou M.D.   On: 02/28/2024 12:54     Procedures   Medications Ordered in the ED  lactated ringers  infusion (0 mLs Intravenous Stopped 02/28/24 1438)  azithromycin (ZITHROMAX) 500 mg in sodium chloride  0.9 % 250 mL IVPB (0 mg Intravenous Stopped 02/28/24 1848)  cefTRIAXone (ROCEPHIN) 1 g in sodium chloride  0.9 % 100 mL IVPB (0 g Intravenous Stopped 02/28/24 1848)  morphine  (MS CONTIN ) 12 hr tablet 15 mg (has no administration in time range)  oxyCODONE  (Oxy IR/ROXICODONE ) immediate release tablet 5 mg (has no administration in time range)  apixaban  (ELIQUIS ) tablet 5 mg (has no administration in time range)  gabapentin  (NEURONTIN ) capsule 300 mg (300 mg Oral Given 02/28/24 1734)  acetaminophen  (TYLENOL ) tablet 650 mg (has no administration in time range)    Or  acetaminophen  (TYLENOL ) suppository 650 mg (has no administration in time range)  zolpidem (AMBIEN) tablet 5 mg (has no administration in time range)  ondansetron  (ZOFRAN ) tablet 4 mg  ( Oral See Alternative 02/28/24 2038)    Or  ondansetron  (ZOFRAN ) injection 4 mg (4 mg Intravenous Given 02/28/24 2038)  albuterol (PROVENTIL) (2.5 MG/3ML) 0.083% nebulizer solution 2.5 mg (has no administration in time range)  pantoprazole  (PROTONIX ) EC tablet 40 mg (has no administration in time range)  lactated ringers  bolus 1,000 mL (0 mLs Intravenous Stopped 02/28/24 1238)  ceFEPIme (MAXIPIME) 2 g in sodium chloride  0.9 % 100 mL IVPB (0 g Intravenous Stopped 02/28/24 1238)  metroNIDAZOLE (FLAGYL) IVPB 500 mg (0 mg Intravenous Stopped 02/28/24 1238)  vancomycin (VANCOCIN) IVPB 1000 mg/200 mL premix (0 mg Intravenous Stopped 02/28/24 1438)  acetaminophen  (TYLENOL ) tablet 650 mg (650 mg Oral Given 02/28/24 1259)  ondansetron  (ZOFRAN ) injection 4 mg (4 mg Intravenous Given 02/28/24 1438)  iohexol  (OMNIPAQUE ) 300 MG/ML solution 100 mL (100 mLs Intravenous Contrast Given 02/28/24 1527)    Clinical Course as of 02/28/24 2103  Mon Feb 28, 2024  1616 Received signout; will need admission for hypoxic respiratory failure, likely secondary to pneumonia.  Pending CT scans.  See morning team's note for full HPI. [TY]  1617 CT ABDOMEN PELVIS W CONTRAST IMPRESSION: 1. New pathologic fracture along the right superior endplate of S1. 2. Stable soft tissue mass with osseous involvement at the level of the right L5-S1 disc space. 3. Stable severe right-sided hydroureteronephrosis to the level of the pelvic inlet/surgical staples. 4. Marked distention of the bladder. 5. Large amount of stool throughout the colon. 6. Aortic atherosclerosis.  Aortic Atherosclerosis (ICD10-I70.0).   Electronically Signed   By: Greig Pique M.D.   On: 02/28/2024 16:03   [TY]  1626 CT Head Wo Contrast IMPRESSION: No acute intracranial abnormality.   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  History concerning for sepsis on arrival to ED, empiric abx and 1L LR ordered.  Labs completed and evaluated by me show no sign of UTI, normal lactic acid, COVID/flu/RSV done given congestion and fever are negative, CMP with mild hyponatremia, CBC with mild anemia, no leukocytosis,INR 1.4, BNP without elevation.  CXR with bilateral upper lobe opacities new since prior, in setting of fever and hypoxia suspect likely pneumonia as source of fever. Lower suspicion for meningitis.  She does have abdominal tenderness on exam, hx of flank pain yesterday, will obtain CT abdomen pelvis for further evaluation.  CT head also ordered given headache and on eliquis .    CT pending at time of transfer of care. Will need admission with concern for fever and hypoxia, pneumonia if no other etiologies seen on ct head and abdomen.      Final diagnoses:  Acute respiratory failure with hypoxia (HCC)  Fever, unspecified fever cause  Community acquired pneumonia, unspecified laterality  Generalized abdominal tenderness without rebound tenderness  Nonintractable headache, unspecified chronicity pattern, unspecified headache type    ED Discharge Orders     None          Dreama Longs, MD 02/28/24 2103

## 2024-02-28 NOTE — ED Notes (Signed)
 Dr. Dreama placed pt on NRB 15L. Pt cpox 85% on 4L . RT called.

## 2024-02-28 NOTE — Progress Notes (Signed)
   02/28/24 1455  Oxygen Therapy/Pulse Ox  O2 Device Nasal Cannula  O2 Therapy Oxygen  O2 Flow Rate (L/min) (S)  3 L/min (Pt is resting comfortably on 3 L [place], no wheezing noted.)  FiO2 (%) 32 %  SpO2 100 %  Safety Instructions Yes (Comment)

## 2024-02-28 NOTE — ED Notes (Signed)
 Lab called to add on the Lipase

## 2024-02-28 NOTE — ED Notes (Signed)
 RT at bedside.

## 2024-02-28 NOTE — ED Triage Notes (Signed)
 PTAR reports pt coming from home for a fever. Husband states pt temp was 102.9 . Pt reports some nausea,  headache and right leg pain. Pt does have chemo tomorrow. Pt initial pulse ox was 82% on RA with PTAR, placed on [place]  4L increased to 94%.

## 2024-02-28 NOTE — ED Notes (Signed)
 In/ Out cath done on patient to obtain urine

## 2024-02-29 ENCOUNTER — Inpatient Hospital Stay

## 2024-02-29 ENCOUNTER — Encounter: Payer: Self-pay | Admitting: *Deleted

## 2024-02-29 DIAGNOSIS — I82621 Acute embolism and thrombosis of deep veins of right upper extremity: Secondary | ICD-10-CM

## 2024-02-29 DIAGNOSIS — C3491 Malignant neoplasm of unspecified part of right bronchus or lung: Secondary | ICD-10-CM

## 2024-02-29 DIAGNOSIS — I82409 Acute embolism and thrombosis of unspecified deep veins of unspecified lower extremity: Secondary | ICD-10-CM | POA: Insufficient documentation

## 2024-02-29 DIAGNOSIS — E860 Dehydration: Secondary | ICD-10-CM | POA: Diagnosis not present

## 2024-02-29 DIAGNOSIS — E871 Hypo-osmolality and hyponatremia: Secondary | ICD-10-CM | POA: Insufficient documentation

## 2024-02-29 DIAGNOSIS — R7401 Elevation of levels of liver transaminase levels: Secondary | ICD-10-CM

## 2024-02-29 DIAGNOSIS — J9601 Acute respiratory failure with hypoxia: Principal | ICD-10-CM | POA: Diagnosis present

## 2024-02-29 DIAGNOSIS — E785 Hyperlipidemia, unspecified: Secondary | ICD-10-CM

## 2024-02-29 DIAGNOSIS — J189 Pneumonia, unspecified organism: Secondary | ICD-10-CM | POA: Diagnosis not present

## 2024-02-29 LAB — CBC
HCT: 32 % — ABNORMAL LOW (ref 36.0–46.0)
Hemoglobin: 10.4 g/dL — ABNORMAL LOW (ref 12.0–15.0)
MCH: 29.8 pg (ref 26.0–34.0)
MCHC: 32.5 g/dL (ref 30.0–36.0)
MCV: 91.7 fL (ref 80.0–100.0)
Platelets: 258 10*3/uL (ref 150–400)
RBC: 3.49 MIL/uL — ABNORMAL LOW (ref 3.87–5.11)
RDW: 15.9 % — ABNORMAL HIGH (ref 11.5–15.5)
WBC: 7 10*3/uL (ref 4.0–10.5)
nRBC: 0 % (ref 0.0–0.2)

## 2024-02-29 LAB — URINALYSIS, COMPLETE (UACMP) WITH MICROSCOPIC
Bacteria, UA: NONE SEEN
Bilirubin Urine: NEGATIVE
Glucose, UA: NEGATIVE mg/dL
Hgb urine dipstick: NEGATIVE
Ketones, ur: 5 mg/dL — AB
Leukocytes,Ua: NEGATIVE
Nitrite: NEGATIVE
Protein, ur: NEGATIVE mg/dL
Specific Gravity, Urine: 1.012 (ref 1.005–1.030)
pH: 7 (ref 5.0–8.0)

## 2024-02-29 LAB — COMPREHENSIVE METABOLIC PANEL WITH GFR
ALT: 42 U/L (ref 0–44)
AST: 30 U/L (ref 15–41)
Albumin: 2.3 g/dL — ABNORMAL LOW (ref 3.5–5.0)
Alkaline Phosphatase: 62 U/L (ref 38–126)
Anion gap: 11 (ref 5–15)
BUN: 15 mg/dL (ref 8–23)
CO2: 22 mmol/L (ref 22–32)
Calcium: 7.8 mg/dL — ABNORMAL LOW (ref 8.9–10.3)
Chloride: 103 mmol/L (ref 98–111)
Creatinine, Ser: 0.7 mg/dL (ref 0.44–1.00)
GFR, Estimated: >60 mL/min (ref >60)
Glucose, Bld: 75 mg/dL (ref 70–99)
Potassium: 4.1 mmol/L (ref 3.5–5.1)
Sodium: 136 mmol/L (ref 135–145)
Total Bilirubin: 1.1 mg/dL (ref 0.0–1.2)
Total Protein: 5.7 g/dL — ABNORMAL LOW (ref 6.5–8.1)

## 2024-02-29 MED ORDER — POLYETHYLENE GLYCOL 3350 17 G PO PACK
17.0000 g | PACK | Freq: Two times a day (BID) | ORAL | Status: DC
  Filled 2024-02-29: qty 1

## 2024-02-29 MED ORDER — VITAMIN B-12 1000 MCG PO TABS
1000.0000 ug | ORAL_TABLET | Freq: Every day | ORAL | Status: DC
  Administered 2024-02-29 – 2024-03-10 (×11): 1000 ug via ORAL
  Filled 2024-02-29 (×11): qty 1

## 2024-02-29 MED ORDER — POLYETHYLENE GLYCOL 3350 17 G PO PACK
17.0000 g | PACK | Freq: Every day | ORAL | Status: DC
  Administered 2024-03-01 – 2024-03-02 (×2): 17 g via ORAL
  Filled 2024-02-29 (×2): qty 1

## 2024-02-29 MED ORDER — SODIUM CHLORIDE 0.9% FLUSH
10.0000 mL | INTRAVENOUS | Status: DC | PRN
  Administered 2024-03-10: 10 mL

## 2024-02-29 MED ORDER — LIDOCAINE-PRILOCAINE 2.5-2.5 % EX CREA
TOPICAL_CREAM | Freq: Once | CUTANEOUS | Status: AC
  Filled 2024-02-29: qty 5

## 2024-02-29 MED ORDER — CALCIUM CITRATE 250 MG PO TABS: 250.0000 mg | ORAL_TABLET | Freq: Every morning | ORAL | Status: DC

## 2024-02-29 MED ORDER — ROSUVASTATIN CALCIUM 5 MG PO TABS
5.0000 mg | ORAL_TABLET | Freq: Every day | ORAL | Status: DC
  Administered 2024-03-01 – 2024-03-07 (×7): 5 mg via ORAL
  Filled 2024-02-29 (×7): qty 1

## 2024-02-29 MED ORDER — CALCIUM CITRATE 950 (200 CA) MG PO TABS
200.0000 mg | ORAL_TABLET | Freq: Every day | ORAL | Status: DC
  Administered 2024-03-01 – 2024-03-10 (×10): 950 mg via ORAL
  Filled 2024-02-29 (×10): qty 1

## 2024-02-29 MED ORDER — SENNOSIDES-DOCUSATE SODIUM 8.6-50 MG PO TABS
1.0000 | ORAL_TABLET | Freq: Two times a day (BID) | ORAL | Status: DC
  Administered 2024-02-29 – 2024-03-02 (×6): 1 via ORAL
  Filled 2024-02-29 (×6): qty 1

## 2024-02-29 MED ORDER — VITAMIN D 25 MCG (1000 UNIT) PO TABS
2000.0000 [IU] | ORAL_TABLET | Freq: Every day | ORAL | Status: DC
  Administered 2024-03-01 – 2024-03-10 (×10): 2000 [IU] via ORAL
  Filled 2024-02-29 (×10): qty 2

## 2024-02-29 MED ORDER — BISACODYL 10 MG RE SUPP: 10.0000 mg | Freq: Once | RECTAL | Status: DC

## 2024-02-29 MED ORDER — SODIUM CHLORIDE 0.9 % IV SOLN
2.0000 g | INTRAVENOUS | Status: DC
  Administered 2024-02-29 – 2024-03-03 (×4): 2 g via INTRAVENOUS
  Filled 2024-02-29 (×4): qty 20

## 2024-02-29 MED ORDER — SODIUM CHLORIDE 0.9% FLUSH
10.0000 mL | Freq: Two times a day (BID) | INTRAVENOUS | Status: DC
  Administered 2024-03-03 – 2024-03-08 (×5): 10 mL

## 2024-02-29 MED ORDER — ALENDRONATE SODIUM 70 MG PO TABS: 70.0000 mg | ORAL_TABLET | ORAL | Status: DC

## 2024-02-29 MED ORDER — LACTATED RINGERS IV SOLN: INTRAVENOUS | Status: AC

## 2024-02-29 MED ORDER — ORAL CARE MOUTH RINSE
15.0000 mL | OROMUCOSAL | Status: DC | PRN
  Administered 2024-03-04: 15 mL via OROMUCOSAL

## 2024-02-29 MED ORDER — SODIUM CHLORIDE 0.9 % IV BOLUS
1000.0000 mL | Freq: Once | INTRAVENOUS | Status: AC
  Administered 2024-02-29: 1000 mL via INTRAVENOUS

## 2024-02-29 MED ORDER — CHLORHEXIDINE GLUCONATE CLOTH 2 % EX PADS
6.0000 | MEDICATED_PAD | Freq: Every day | CUTANEOUS | Status: DC
  Administered 2024-02-29 – 2024-03-09 (×10): 6 via TOPICAL

## 2024-02-29 NOTE — Plan of Care (Signed)

## 2024-02-29 NOTE — Progress Notes (Signed)
 PHARMACIST - PHYSICIAN COMMUNICATION  CONCERNING: P&T Medication Policy Regarding Oral Bisphosphonates  RECOMMENDATION: Your order for alendronate (Fosamax), ibandronate (Boniva), or risedronate (Actonel) has been discontinued at this time.  If the patient's post-hospital medical condition warrants safe use of this class of drugs, please resume the pre-hospital regimen upon discharge.  DESCRIPTION:  Alendronate (Fosamax), ibandronate (Boniva), and risedronate (Actonel) can cause severe esophageal erosions in patients who are unable to remain upright at least 30 minutes after taking this medication.   Since brief interruptions in therapy are thought to have minimal impact on bone mineral density, the Pharmacy & Therapeutics Committee has established that bisphosphonate orders should be routinely discontinued during hospitalization.   To override this safety policy and permit administration of Boniva, Fosamax, or Actonel in the hospital, prescribers must write "DO NOT HOLD" in the comments section when placing the order for this class of medications.

## 2024-02-29 NOTE — Evaluation (Signed)
 Physical Therapy Evaluation Patient Details Name: Regina Young MRN: [id number] DOB: 07/06/1956 Today's Date: 02/29/2024  History of Present Illness  68 yo female presents to therapy following hospital admission on 02/28/2024 due to elevated tempeture, SOB and AMS. Pt found to have acute respiratory failure secondary to CAP. Pt PMH includes but is not limited to metastatic lung ca with sacral and retroperitoneum mets s/p radiation and anterior resection, R LE DVT, HLD, and GERD.  Clinical Impression   Pt admitted with above diagnosis.  Pt currently with functional limitations due to the deficits listed below (see PT Problem List). Pt in bed when PT arrived. Pt sister and spouse present. Pt agreeable to therapy intervention. Pt is mod I for bed mobility with use of hospital bed, CGA for transfers from higher surfaces and min A from standard chair without arm rests, gait assessed with and without AD with PT recommendation for use of RW for safety, stability and energy conservation. Pt reported no supplemental O2 PLOF and goal to be able to return home on RA. On 3 L/min at rest 98%, seated on 2 L/min 97% on 1 L/min 96% with exertion on 2 L/min 96% and no SOB with cues for pursed lip breathing. Pt indicated occasional sharp shooting R LE pain attributed to metastatic ca. Pt left seated in recliner, all needs in place on 2 L/min and 98% and sister present. Pt will benefit from [facility] services at time of hospital d/c.  Pt will benefit from acute skilled PT to increase their independence and safety with mobility to allow discharge.         If plan is discharge home, recommend the following: A little help with walking and/or transfers;A little help with bathing/dressing/bathroom;Assistance with cooking/housework;Help with stairs or ramp for entrance;Assist for transportation   Can travel by private vehicle        Equipment Recommendations None recommended by PT  Recommendations for Other Services        Functional Status Assessment Patient has had a recent decline in their functional status and demonstrates the ability to make significant improvements in function in a reasonable and predictable amount of time.     Precautions / Restrictions Precautions Precautions: Fall Restrictions Weight Bearing Restrictions Per Provider Order: No      Mobility  Bed Mobility Overal bed mobility: Modified Independent             General bed mobility comments: supine to sit with use of hospital bed    Transfers Overall transfer level: Needs assistance Equipment used: Rolling walker (2 wheels), None Transfers: Sit to/from Stand Sit to Stand: Contact guard assist           General transfer comment: pt reported need to void bowels and from EOB CGA for sit to stand no AD, no AD for commode transfer with pt able to pull to stand at grab bar, pt required min A for sit to stand from standard chair without arm rests to RW min cues    Ambulation/Gait Ambulation/Gait assistance: Contact guard assist, Supervision Gait Distance (Feet): 150 Feet Assistive device: Rolling walker (2 wheels), None Gait Pattern/deviations: Step-through pattern Gait velocity: decreased     General Gait Details: gait initally assessed in personal room no AD with CGA and min cues for safety and obstacle navigation, gait tasks in hallway with RW for energy conservation, safety and stability with pt indicating she preferred the RW min cues for RW management and safety  Stairs  Wheelchair Mobility     Tilt Bed    Modified Rankin (Stroke Patients Only)       Balance Overall balance assessment: Mild deficits observed, not formally tested                                           Pertinent Vitals/Pain Pain Assessment Pain Assessment: 0-10 Pain Score: 4  Pain Location: R LE Pain Descriptors / Indicators: Sharp, Shooting Pain Intervention(s): Limited activity within  patient's tolerance, Monitored during session    Home Living Family/patient expects to be discharged to:: Private residence Living Arrangements: Spouse/significant other Available Help at Discharge: Family Type of Home: House           Home Equipment: Agricultural consultant (2 wheels);Cane - single point      Prior Function Prior Level of Function : Independent/Modified Independent             Mobility Comments: pt reports limited community mobiltiy and need for RW for gait tasks first thing in the am and then no AD in home throughout the day. ADLs Comments: pt is mod I for ADLs and self care tasks, pt states husband or sister can provide A if needed     Extremity/Trunk Assessment        Lower Extremity Assessment Lower Extremity Assessment: Generalized weakness    Cervical / Trunk Assessment Cervical / Trunk Assessment: Normal  Communication   Communication Communication: No apparent difficulties    Cognition Arousal: Alert Behavior During Therapy: WFL for tasks assessed/performed   PT - Cognitive impairments: No apparent impairments                         Following commands: Intact       Cueing       General Comments      Exercises     Assessment/Plan    PT Assessment Patient needs continued PT services  PT Problem List Decreased activity tolerance;Decreased balance;Pain;Cardiopulmonary status limiting activity       PT Treatment Interventions DME instruction;Gait training;Stair training;Functional mobility training;Therapeutic activities;Therapeutic exercise;Balance training;Neuromuscular re-education;Patient/family education    PT Goals (Current goals can be found in the Care Plan section)  Acute Rehab PT Goals Patient Stated Goal: to get stronger, go home without supplemental O2, start chemo PT Goal Formulation: With patient Time For Goal Achievement: 03/14/24 Potential to Achieve Goals: Good    Frequency Min 3X/week      Co-evaluation               AM-PAC PT 6 Clicks Mobility  Outcome Measure Help needed turning from your back to your side while in a flat bed without using bedrails?: None Help needed moving from lying on your back to sitting on the side of a flat bed without using bedrails?: None Help needed moving to and from a bed to a chair (including a wheelchair)?: A Little Help needed standing up from a chair using your arms (e.g., wheelchair or bedside chair)?: A Little Help needed to walk in hospital room?: A Little Help needed climbing 3-5 steps with a railing? : A Lot 6 Click Score: 19    End of Session Equipment Utilized During Treatment: Gait belt;Oxygen Activity Tolerance: Patient tolerated treatment well;No increased pain Patient left: in chair;with call bell/phone within reach;with family/visitor present Nurse Communication: Mobility status;Other (comment) (O2 findings)  PT Visit Diagnosis: Unsteadiness on feet (R26.81);Muscle weakness (generalized) (M62.81);Pain;Difficulty in walking, not elsewhere classified (R26.2) Pain - Right/Left: Right Pain - part of body: Leg;Knee;Ankle and joints of foot    Time: 8963-8887 PT Time Calculation (min) (ACUTE ONLY): 36 min   Charges:   PT Evaluation $PT Eval Low Complexity: 1 Low PT Treatments $Gait Training: 8-22 mins PT General Charges $$ ACUTE PT VISIT: 1 Visit         [place], PT Acute Rehab   [place] VEAR Drone 02/29/2024, 2:28 PM

## 2024-02-29 NOTE — Progress Notes (Signed)
 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  Patient placed on empiric IV antibiotics.  Patient also seen in consultation by hematology/oncology.   Assessment & Plan:   Principal Problem:   Acute respiratory failure with hypoxia (HCC) Active Problems:   CAP (community acquired pneumonia)   Hyperlipidemia   Lung cancer, primary, with metastasis from lung to other site, right (HCC)   Dehydration   DVT (deep venous thrombosis) (HCC)   Transaminitis   Hyponatremia  #1 acute hypoxic respiratory failure likely secondary to community-acquired pneumonia - Patient noted to have presented with fevers as high as 102.9, nausea, headache, right lower extremity pain. - Patient noted to be compliant with anticoagulation with Eliquis . - Chest x-ray done with bilateral upper lobe opacities concerning for infiltrate versus edema. - Patient currently with sats of 94 to 97% on 3 L nasal cannula. - Blood cultures pending with no growth to date. - SARS coronavirus 2 PCR negative - Influenza A and B by PCR negative - RSV by PCR negative. - Patient improving clinically. - Place on PPI, Mucinex, Claritin, Flonase. - Continue empiric IV azithromycin and IV Rocephin. - Supportive care.  2.  Dehydration -IV fluids.  3.  Community-acquired pneumonia -Noted on chest x-ray bilateral upper lobe opacities. - Patient improving clinically. - Placed on PPI, Mucinex, Claritin, Flonase. - Continue empiric IV Rocephin and IV azithromycin.  4.  Hyponatremia -Felt likely secondary to a hypovolemic hyponatremia secondary to dehydration in the setting  of an acute infection. - Improved with hydration.  5.  History of DVT -Eliquis .  6.  History of lung cancer with metastases -Continue home dose of MS Contin , oxycodone  as needed for breakthrough pain. - Patient being followed by Dr. Timmy per oncology.  7.  Constipation -MiraLAX  daily. - Senokot-S twice daily. - Dulcolax suppository x 1.  8.  Transaminitis -Minimal and trending down. - Likely secondary to acute infection. - Repeat labs in the AM.  9.   DVT prophylaxis: Eliquis  Code Status: Full Family Communication: Updated patient and sister at bedside. Disposition: TBD  Status is: Inpatient Remains inpatient appropriate because: Severity of illness   Consultants:  Oncology: Dr. Timmy  Procedures:  CT head without contrast 02/28/2024 CT abdomen and pelvis 02/28/2024 Chest x-ray 02/28/2024   Antimicrobials:  Anti-infectives (From admission, onward)    Start     Dose/Rate Route Frequency Ordered Stop   02/29/24 1800  cefTRIAXone (ROCEPHIN) 2 g in sodium chloride  0.9 % 100 mL IVPB        2 g 200 mL/hr over 30 Minutes Intravenous Every 24 hours 02/29/24 0822     02/28/24 1800  azithromycin (ZITHROMAX) 500 mg in sodium chloride  0.9 % 250 mL IVPB        500 mg 250 mL/hr over 60 Minutes Intravenous Every 24 hours 02/28/24 1656     02/28/24 1800  cefTRIAXone (ROCEPHIN) 1 g in sodium chloride  0.9 % 100 mL IVPB  Status:  Discontinued        1 g 200 mL/hr over 30 Minutes Intravenous Every 24 hours 02/28/24 1656 02/29/24 0822   02/28/24 1115  ceFEPIme (MAXIPIME) 2 g in sodium chloride  0.9 %  100 mL IVPB        2 g 200 mL/hr over 30 Minutes Intravenous  Once 02/28/24 1103 02/28/24 1238   02/28/24 1115  metroNIDAZOLE (FLAGYL) IVPB 500 mg        500 mg 100 mL/hr over 60 Minutes Intravenous  Once 02/28/24 1103 02/28/24 1238   02/28/24 1115  vancomycin (VANCOCIN) IVPB 1000 mg/200 mL premix        1,000 mg 200 mL/hr over 60 Minutes Intravenous  Once 02/28/24 1103 02/28/24  1438         Subjective: Sitting up in recliner.  Overall states feeling much better than on admission.  Denies any chest pain.  No significant shortness of breath.  No abdominal pain.  Some complaints of right lower extremity pain that has been ongoing for a while.  Sister at bedside.  Tmax 103.1.  Objective: Vitals:   02/29/24 0137 02/29/24 0311 02/29/24 0500 02/29/24 0900  BP: (!) 142/80 121/73  (!) 133/56  Pulse: (!) 111 98  98  Resp: 16 16    Temp: (!) 103.1 F (39.5 C) 100.2 F (37.9 C) 99.1 F (37.3 C) 97.9 F (36.6 C)  TempSrc: Oral Oral Oral Oral  SpO2: 92% 94%  97%  Weight:      Height:        Intake/Output Summary (Last 24 hours) at 02/29/2024 1651 Last data filed at 02/29/2024 1512 Gross per 24 hour  Intake 3139.4 ml  Output 875 ml  Net 2264.4 ml   Filed Weights   02/28/24 2000  Weight: 48.6 kg    Examination:  General exam: Appears calm and comfortable.  Port-A-Cath site on right upper chest wall with no erythema, no warmth, nontender to palpation. Respiratory system: Some scattered coarse breath sounds.  No wheezing.  No crackles.  Fair air movement.  Speaking in full sentences.  Cardiovascular system: S1 & S2 heard, RRR. No JVD, murmurs, rubs, gallops or clicks. No pedal edema. Gastrointestinal system: Abdomen is nondistended, soft and nontender. No organomegaly or masses felt. Normal bowel sounds heard. Central nervous system: Alert and oriented. No focal neurological deficits. Extremities: Symmetric 5 x 5 power. Skin: No rashes, lesions or ulcers Psychiatry: Judgement and insight appear normal. Mood & affect appropriate.     Data Reviewed: I have personally reviewed following labs and imaging studies  CBC: Recent Labs  Lab 02/28/24 1132 02/29/24 0523  WBC 7.3 7.0  NEUTROABS 6.6  --   HGB 10.9* 10.4*  HCT 34.3* 32.0*  MCV 92.7 91.7  PLT 235 258    Basic Metabolic Panel: Recent Labs  Lab 02/28/24 1132 02/29/24 0523  NA 130* 136  K  3.7 4.1  CL 94* 103  CO2 24 22  GLUCOSE 87 75  BUN 21 15  CREATININE 0.94 0.70  CALCIUM  7.8* 7.8*    GFR: Estimated Creatinine Clearance: 53.8 mL/min (by C-G formula based on SCr of 0.7 mg/dL).  Liver Function Tests: Recent Labs  Lab 02/28/24 1132 02/29/24 0523  AST 34 30  ALT 59* 42  ALKPHOS 66 62  BILITOT 0.8 1.1  PROT 5.9* 5.7*  ALBUMIN 2.7* 2.3*    CBG: No results for input(s): GLUCAP in the last 168 hours.   Recent Results (from the past 240 hours)  Blood Culture (routine x 2)     Status: None (Preliminary result)   Collection Time: 02/28/24 11:32 AM   Specimen: BLOOD  Result Value Ref Range Status   Specimen Description   Final  BLOOD BLOOD RIGHT ARM Performed at [facility], [address] [place], [place] [address]    Special Requests   Final    BOTTLES DRAWN AEROBIC AND ANAEROBIC Blood Culture adequate volume Performed at [facility], [address], [place], [place] [address]    Culture   Final    NO GROWTH < 24 HOURS Performed at [facility], [address] [place], [place] [address]    Report Status PENDING  Incomplete  Resp panel by RT-PCR (RSV, Flu A&B, Covid) Anterior Nasal Swab     Status: None   Collection Time: 02/28/24 12:07 PM   Specimen: Anterior Nasal Swab  Result Value Ref Range Status   SARS Coronavirus 2 by RT PCR NEGATIVE NEGATIVE Final    Comment: (NOTE) SARS-CoV-2 target nucleic acids are NOT DETECTED.  The SARS-CoV-2 RNA is generally detectable in upper respiratory specimens during the acute phase of infection. The lowest concentration of SARS-CoV-2 viral copies this assay can detect is 138 copies/mL. A negative result does not preclude SARS-Cov-2 infection and should not be used as the sole basis for treatment or other patient management decisions. A negative result may occur with  improper specimen collection/handling, submission of specimen other than nasopharyngeal swab,  presence of viral mutation(s) within the areas targeted by this assay, and inadequate number of viral copies(<138 copies/mL). A negative result must be combined with clinical observations, patient history, and epidemiological information. The expected result is Negative.  Fact Sheet for Patients:  BloggerCourse.com  Fact Sheet for Healthcare Providers:  SeriousBroker.it  This test is no t yet approved or cleared by the [place]  FDA and  has been authorized for detection and/or diagnosis of SARS-CoV-2 by FDA under an Emergency Use Authorization (EUA). This EUA will remain  in effect (meaning this test can be used) for the duration of the COVID-19 declaration under Section 564(b)(1) of the Act, 21 U.S.C.section 360bbb-3(b)(1), unless the authorization is terminated  or revoked sooner.       Influenza A by PCR NEGATIVE NEGATIVE Final   Influenza B by PCR NEGATIVE NEGATIVE Final    Comment: (NOTE) The Xpert Xpress SARS-CoV-2/FLU/RSV plus assay is intended as an aid in the diagnosis of influenza from Nasopharyngeal swab specimens and should not be used as a sole basis for treatment. Nasal washings and aspirates are unacceptable for Xpert Xpress SARS-CoV-2/FLU/RSV testing.  Fact Sheet for Patients: BloggerCourse.com  Fact Sheet for Healthcare Providers: SeriousBroker.it  This test is not yet approved or cleared by the [place]  FDA and has been authorized for detection and/or diagnosis of SARS-CoV-2 by FDA under an Emergency Use Authorization (EUA). This EUA will remain in effect (meaning this test can be used) for the duration of the COVID-19 declaration under Section 564(b)(1) of the Act, 21 U.S.C. section 360bbb-3(b)(1), unless the authorization is terminated or revoked.     Resp Syncytial Virus by PCR NEGATIVE NEGATIVE Final    Comment: (NOTE) Fact Sheet for  Patients: BloggerCourse.com  Fact Sheet for Healthcare Providers: SeriousBroker.it  This test is not yet approved or cleared by the [place]  FDA and has been authorized for detection and/or diagnosis of SARS-CoV-2 by FDA under an Emergency Use Authorization (EUA). This EUA will remain in effect (meaning this test can be used) for the duration of the COVID-19 declaration under Section 564(b)(1) of the Act, 21 U.S.C. section 360bbb-3(b)(1), unless the authorization is terminated or revoked.  Performed at [facility], [address] [place], [place] [address]   Blood Culture (routine x 2)     Status: None (Preliminary result)   Collection Time: 02/28/24 12:08 PM   Specimen: BLOOD  Result Value Ref Range Status   Specimen Description   Final    BLOOD BLOOD LEFT ARM Performed at [facility], [address] [place], [place] [address]    Special Requests   Final    BOTTLES DRAWN AEROBIC ONLY Blood Culture adequate volume Performed at [facility], [address], [place], [place] [address]    Culture   Final    NO GROWTH < 24 HOURS Performed at [facility], [address] [place], [place] [address]    Report Status PENDING  Incomplete         Radiology Studies: CT Head Wo Contrast Result Date: 02/28/2024 CLINICAL DATA:  Headache and right leg pain. EXAM: CT HEAD WITHOUT CONTRAST TECHNIQUE: Contiguous axial images were obtained from the base of the skull through the vertex without intravenous contrast. RADIATION DOSE REDUCTION: This exam was performed according to the departmental dose-optimization program which includes automated exposure control, adjustment of the mA and/or kV according to patient size and/or use of iterative reconstruction technique. COMPARISON:  December 22, 2023 FINDINGS: Brain: No evidence of acute infarction, hemorrhage, hydrocephalus,  extra-axial collection or mass lesion/mass effect. Vascular: No hyperdense vessel or unexpected calcification. Skull: Normal. Negative for fracture or focal lesion. Sinuses/Orbits: No acute finding. Other: A small, stable partially calcified left posterior parietal scalp soft tissue nodule is seen. IMPRESSION: No acute intracranial abnormality. Electronically Signed   By: Suzen Dials M.D.   On: 02/28/2024 16:15   CT ABDOMEN PELVIS W CONTRAST Result Date: 02/28/2024 CLINICAL DATA:  History of rectal cancer.  Abdominal pain and fever. EXAM: CT ABDOMEN AND PELVIS WITH CONTRAST TECHNIQUE: Multidetector CT imaging of the abdomen and pelvis was performed using the standard protocol following bolus administration of intravenous contrast. RADIATION DOSE REDUCTION: This exam was performed according to the departmental dose-optimization program which includes automated exposure control, adjustment of the mA and/or kV according to patient size and/or use of iterative reconstruction technique. CONTRAST:  OMNIPAQUE  IOHEXOL  300 MG/ML  SOLN COMPARISON:  CT venogram abdomen and pelvis 02/15/2024. FINDINGS: Lower chest: No acute abnormality. Hepatobiliary: There are scattered rounded hypodensities throughout the liver favored as cysts. These measure up to 19 mm and appear unchanged from the prior study. No new liver lesions are seen. Gallbladder and bile ducts are within normal limits. Pancreas: Unremarkable. No pancreatic ductal dilatation or surrounding inflammatory changes. Spleen: Rounded hypodensities in the spleen are unchanged, possibly cysts or hemangiomas. Spleen is normal in size. Adrenals/Urinary Tract: There is stable severe right-sided hydroureteronephrosis to the level of the pelvic inlet/surgical staples. The left kidney, adrenal glands and bladder are within normal limits. The bladder is markedly distended. Stomach/Bowel: There is no bowel obstruction, focal wall thickening, inflammation or free air  identified. The appendix is not visualized. Rectosigmoid anastomosis is present. There is a large amount of stool throughout the entire colon the stomach is within normal limits. Vascular/Lymphatic: Aortic atherosclerosis. No enlarged abdominal or pelvic lymph nodes. Reproductive: Status post hysterectomy. No adnexal masses. Other: There is no ascites or focal abdominal wall hernia. Musculoskeletal: There is a stable soft tissue mass with osseous involvement the level of the right L5-S1 anteriorly. This measures approximally 5.5 x 2.7 cm. There is new pathologic fracture along the right superior endplate of S1. IMPRESSION: 1.  New pathologic fracture along the right superior endplate of S1. 2. Stable soft tissue mass with osseous involvement at the level of the right L5-S1 disc space. 3. Stable severe right-sided hydroureteronephrosis to the level of the pelvic inlet/surgical staples. 4. Marked distention of the bladder. 5. Large amount of stool throughout the colon. 6. Aortic atherosclerosis. Aortic Atherosclerosis (ICD10-I70.0). Electronically Signed   By: Greig Pique M.D.   On: 02/28/2024 16:03   DG Chest Port 1 View Result Date: 02/28/2024 CLINICAL DATA:  Sepsis. EXAM: PORTABLE CHEST 1 VIEW COMPARISON:  Chest radiograph dated 01/04/2024. FINDINGS: Right-sided Port-A-Cath with tip close to the cavoatrial junction. Bilateral upper lobe opacities, new since the prior radiograph and may represent infiltrate or edema. No pleural effusion or pneumothorax. Stable cardiac silhouette. No acute osseous pathology. IMPRESSION: Bilateral upper lobe opacities, new since the prior radiograph and may represent infiltrate or edema. Electronically Signed   By: Vanetta Chou M.D.   On: 02/28/2024 12:54        Scheduled Meds:  apixaban   5 mg Oral BID   bisacodyl  10 mg Rectal Once   Chlorhexidine  Gluconate Cloth  6 each Topical Daily   gabapentin   300 mg Oral TID   morphine   15 mg Oral Q12H   pantoprazole   40 mg  Oral QAC breakfast   [START ON 03/01/2024] polyethylene glycol  17 g Oral Daily   senna-docusate  1 tablet Oral BID   sodium chloride  flush  10-40 mL Intracatheter Q12H   Continuous Infusions:  azithromycin Stopped (02/28/24 1846)   cefTRIAXone (ROCEPHIN)  IV     lactated ringers  125 mL/hr at 02/29/24 1233     LOS: 1 day    Time spent: 40 minutes    Toribio Hummer, MD [facility]   To contact the attending provider between 7A-7P or the covering provider during after hours 7P-7A, please log into the web site [email or web address] and access using universal [place] password for that web site. If you do not have the password, please call the hospital operator.  02/29/2024, 4:51 PM

## 2024-02-29 NOTE — Progress Notes (Signed)
 Patient was seen for discussion of treatment on 6/13. Plan for port, and chemo made. Port was placed on 6/17 and plan for chemo education and chemo start made for 02/29/2024.  Yesterday, patient began to experience severe pain and had a high fever. Patient was seen in the ED and admitted for pneumonia. Chemo start will be delayed. She will still need chemo ed.  Will continue to follow for post discharge needs and office follow up.   Oncology Nurse Navigator Documentation     02/29/2024    7:45 AM  Oncology Nurse Navigator Flowsheets  Phase of Treatment Chemotherapy  Chemotherapy Pending- Reason: Infection  Navigator Follow Up Date: 03/03/2024  Navigator Follow Up Reason: Appointment Review  Navigator Location [facility]  Navigator Encounter Type Appt/Treatment Plan Review  Patient Visit Type MedOnc  Treatment Phase Active Tx  Barriers/Navigation Needs Coordination of Care;Education  Interventions None Required  Acuity Level 2-Minimal Needs (1-2 Barriers Identified)  Support Groups/Services Friends and Family  Time Spent with Patient 15

## 2024-02-29 NOTE — TOC CM/SW Note (Addendum)
 Transition of Care [facility]) - Initial/Assessment Note    Patient Details  Name: Regina Young MRN: [id number] Date of Birth: [birth year]  Transition of Care [facility]) CM/SW Contact:    Bascom Service, RN Phone Number: 02/29/2024, 11:02 AM  Clinical Narrative: Has PCP; spoke w/dtr [facility] POC [phone].  pt says  lives at home;  plans to return at d/c; pt identified POC; Has own transportation; pt verified insurance; he denied SDOH risks; Has can,rw @ home does not have HH services, or home oxygen; PT eval await recc.Continue follow.  -3:20p-Patient plans to discuss HHPT w/spouse prior deciding on HHPT. Not on 02.                          Patient Goals and CMS Choice            Expected Discharge Plan and Services                                              Prior Living Arrangements/Services                       Activities of Daily Living   ADL Screening (condition at time of admission) Independently performs ADLs?: Yes (appropriate for developmental age) Is the patient deaf or have difficulty hearing?: No Does the patient have difficulty seeing, even when wearing glasses/contacts?: No Does the patient have difficulty concentrating, remembering, or making decisions?: No  Permission Sought/Granted                  Emotional Assessment              Admission diagnosis:  CAP (community acquired pneumonia) [J18.9] Acute respiratory failure with hypoxia (HCC) [J96.01] Patient Active Problem List   Diagnosis Date Noted   CAP (community acquired pneumonia) 02/28/2024   Edema 02/01/2024   Lung cancer, primary, with metastasis from lung to other site, right (HCC) 01/10/2024   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) 01/10/2024   Hilar adenopathy 01/04/2024   Hydronephrosis, right 12/23/2023   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   Syncope and collapse 12/23/2023   History of abdominal hysterectomy 12/23/2023   Liver cyst 12/23/2023   Splenic cyst  12/23/2023   Personal history of colon cancer - Stage 1 12/23/2023   Low back pain 12/23/2023   Hydroureter on right 12/23/2023   Hyperlipidemia    Cancer of sigmoid colon, Stage 1, pT1pN0, s/p robotic LAR resection 02/21/2021 02/21/2021   PCP:  Dwight Trula SQUIBB, MD Pharmacy:   [id number] - [place], [place] - [address] AT [facility] OF [address] [place] [address] Phone: [phone] Fax: [phone]     Social Drivers of Health (SDOH) Social History: SDOH Screenings   Food Insecurity: No Food Insecurity (02/28/2024)  Housing: Low Risk  (02/28/2024)  Transportation Needs: No Transportation Needs (02/29/2024)  Utilities: Not At Risk (02/29/2024)  Depression (PHQ2-9): Low Risk  (01/17/2024)  Financial Resource Strain: Low Risk  (01/10/2024)  Social Connections: Moderately Isolated (02/29/2024)  Tobacco Use: Medium Risk (02/28/2024)   SDOH Interventions:     Readmission Risk Interventions     No data to display

## 2024-02-29 NOTE — Consult Note (Signed)
 Regina Young is well-known to me.  She is a very nice 68 year old white female.  She recent was diagnosed with metastatic adenocarcinoma of the lung.  She presented with sacral metastasis.  She has had radiotherapy to the sacrum.  She has radicular pain down the right leg.  We had her set up to start treatment with chemotherapy and immunotherapy today.  She was admitted yesterday.  Looks like she has pneumonia.  I think she may have had some change in cognitive state.  On chest x-ray, she had a infiltrates in the upper lung.  This morning she looks quite well.  Of note, she does have history of DVT in the right leg.  She is on Eliquis .  He still has pain in the lower back and right leg.  Again, I am not sure how we can really relieve this totally.  Again I suspect this is all from her disease.  She is not a surgical candidate for any type of laminectomy.  Her labs show sodium 136.  Potassium 4.1.  BUN 15 creatinine 0.7.  Calcium  is 7.8 with an albumin of 2.3.  Her white cell count 7.  Hemoglobin 10.4.  Platelet count 258,000.  She has had no cough.  She has had no bleeding.  She has had no problems with bowels or bladder.  She feels that she has to urinate quite a bit.  She had a urinalysis on admission which was unremarkable.  She is not eating all that well.  Again, she was admitted.  Looks like she was admitted with fever and pneumonia.  She currently is on Rocephin and azithromycin.  Cultures are pending.   Her vital signs show temperature of 99.1.  Pulse 98.  Blood pressure 121/73.  Her Tmax yesterday was 103.1.  Her lungs sound relatively clear bilaterally.  Cardiac exam regular rate and rhythm.  Abdomen soft.  Bowel sounds are present.  She has no guarding or rebound tenderness.  There is no fluid wave.  There is no abdominal mass.  There is no palpable liver or spleen tip.  Extremities shows no clubbing, cyanosis or edema.  Neurological exam shows no focal neurological deficits.   It  looks like Regina Young has pneumonia.  She seems to be doing pretty well with it right now.  She is on antibiotics.  Again, we will have to hold on her chemotherapy.  We have to wait for this pneumonia to resolve.  We will have to see what cultures show.  We will try to work on this pain and radiculopathy that she has.  Again, I am not sure what we can do.  I do not know if there might be a role for some kind of a nerve ablation that might help with the pain.  We could always get Neuro IR to see if there might be a role for this.  We will follow along.  I know she will get incredible care from everybody on [address].   Jeralyn Crease, MD  [place] 1:5

## 2024-03-01 ENCOUNTER — Inpatient Hospital Stay (HOSPITAL_COMMUNITY)

## 2024-03-01 DIAGNOSIS — J9601 Acute respiratory failure with hypoxia: Secondary | ICD-10-CM

## 2024-03-01 DIAGNOSIS — J189 Pneumonia, unspecified organism: Secondary | ICD-10-CM | POA: Diagnosis not present

## 2024-03-01 LAB — BASIC METABOLIC PANEL WITH GFR
Anion gap: 9 (ref 5–15)
BUN: 13 mg/dL (ref 8–23)
CO2: 25 mmol/L (ref 22–32)
Calcium: 7.5 mg/dL — ABNORMAL LOW (ref 8.9–10.3)
Chloride: 100 mmol/L (ref 98–111)
Creatinine, Ser: 0.75 mg/dL (ref 0.44–1.00)
GFR, Estimated: >60 mL/min (ref >60)
Glucose, Bld: 134 mg/dL — ABNORMAL HIGH (ref 70–99)
Potassium: 3.4 mmol/L — ABNORMAL LOW (ref 3.5–5.1)
Sodium: 134 mmol/L — ABNORMAL LOW (ref 135–145)

## 2024-03-01 LAB — URINE CULTURE: Culture: NO GROWTH

## 2024-03-01 LAB — CBC WITH DIFFERENTIAL/PLATELET
Abs Immature Granulocytes: 0.12 10*3/uL — ABNORMAL HIGH (ref 0.00–0.07)
Basophils Absolute: 0 10*3/uL (ref 0.0–0.1)
Basophils Relative: 0 %
Eosinophils Absolute: 0 10*3/uL (ref 0.0–0.5)
Eosinophils Relative: 1 %
HCT: 28.5 % — ABNORMAL LOW (ref 36.0–46.0)
Hemoglobin: 8.9 g/dL — ABNORMAL LOW (ref 12.0–15.0)
Immature Granulocytes: 2 %
Lymphocytes Relative: 2 %
Lymphs Abs: 0.1 10*3/uL — ABNORMAL LOW (ref 0.7–4.0)
MCH: 29.5 pg (ref 26.0–34.0)
MCHC: 31.2 g/dL (ref 30.0–36.0)
MCV: 94.4 fL (ref 80.0–100.0)
Monocytes Absolute: 0.1 10*3/uL (ref 0.1–1.0)
Monocytes Relative: 3 %
Neutro Abs: 5.3 10*3/uL (ref 1.7–7.7)
Neutrophils Relative %: 92 %
Platelets: 201 10*3/uL (ref 150–400)
RBC: 3.02 MIL/uL — ABNORMAL LOW (ref 3.87–5.11)
RDW: 15.9 % — ABNORMAL HIGH (ref 11.5–15.5)
WBC: 5.7 10*3/uL (ref 4.0–10.5)
nRBC: 0 % (ref 0.0–0.2)

## 2024-03-01 LAB — MAGNESIUM: Magnesium: 2 mg/dL (ref 1.7–2.4)

## 2024-03-01 MED ORDER — POTASSIUM CHLORIDE CRYS ER 20 MEQ PO TBCR
60.0000 meq | EXTENDED_RELEASE_TABLET | Freq: Once | ORAL | Status: AC
  Administered 2024-03-01: 60 meq via ORAL
  Filled 2024-03-01: qty 3

## 2024-03-01 NOTE — Progress Notes (Signed)
 Mobility Specialist - Progress Note   03/01/24 1300  Mobility  Activity Ambulated with assistance in hallway  Level of Assistance Standby assist, set-up cues, supervision of patient - no hands on  Assistive Device Front wheel walker  Distance Ambulated (ft) 160 ft  Range of Motion/Exercises Active  Activity Response Tolerated well  Mobility Referral Yes  Mobility visit 1 Mobility  Mobility Specialist Start Time (ACUTE ONLY) 1243  Mobility Specialist Stop Time (ACUTE ONLY) 1305  Mobility Specialist Time Calculation (min) (ACUTE ONLY) 22 min   Received in chair and agreed to mobility. Had no issues throughout session, returned to chair with all needs met.  Cyndee Ada Mobility Specialist

## 2024-03-01 NOTE — Hospital Course (Signed)
 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 Patient placed on empiric IV antibiotics. Patient also seen in consultation by hematology/oncology.

## 2024-03-01 NOTE — Progress Notes (Signed)
  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 Patient placed on empiric IV antibiotics. Patient also seen in consultation by hematology/oncology.   Assessment and Plan: 1 acute hypoxic respiratory failure likely secondary to community-acquired pneumonia - Patient noted to have presented with fevers as high as 102.9, nausea, headache, right lower extremity pain. - Patient noted to be compliant with anticoagulation with Eliquis . - Chest x-ray done with bilateral upper lobe opacities concerning for infiltrate versus edema. - Patient currently with sats of 94 to 97% on 3 L nasal cannula. - Blood cultures pending with, remains with no growth to date. - SARS coronavirus 2 PCR negative - Influenza A and B by PCR negative - RSV by PCR negative. - Place on PPI, Mucinex, Claritin, Flonase. - Continue empiric IV azithromycin and IV Rocephin. - Fevers noted overnight with sob. Repeat CXR with continued pneumonia   2.  Dehydration -IV fluids.   3.  Community-acquired pneumonia -Noted on chest x-ray bilateral upper lobe opacities. - Placed on PPI, Mucinex, Claritin, Flonase. - Continue empiric IV Rocephin and IV azithromycin. -repeat CXR reviewed, unchanged   4.  Hyponatremia -Felt likely secondary to a hypovolemic hyponatremia secondary to dehydration in the setting of an acute infection. - Improved with hydration.   5.  History of DVT -continue Eliquis .   6.  History of lung cancer with metastases -Continue home dose of MS Contin , oxycodone  as needed for breakthrough pain. - Patient being followed by Dr. Timmy per oncology.   7.   Constipation -MiraLAX  daily. - Senokot-S twice daily. - Dulcolax suppository x 1.   8.  Transaminitis -Minimal and trending down. - Likely secondary to acute infection. - improved      Subjective: Fevers overnight. Without other complaints  Physical Exam: Vitals:   03/01/24 1134 03/01/24 1316 03/01/24 1523 03/01/24 1602  BP: 115/71   102/67  Pulse: (!) 105   (!) 114  Resp: 18   19  Temp: 99.6 F (37.6 C) 98.2 F (36.8 C) (!) 102.4 F (39.1 C) 99.9 F (37.7 C)  TempSrc: Oral Axillary Oral Oral  SpO2: 92%   92%  Weight:      Height:       General exam: Awake, laying in bed, in nad Respiratory system: Normal respiratory effort, no wheezing Cardiovascular system: regular rate, s1, s2 Gastrointestinal system: Soft, nondistended, positive BS Central nervous system: CN2-12 grossly intact, strength intact Extremities: Perfused, no clubbing Skin: Normal skin turgor, no notable skin lesions seen Psychiatry: Mood normal // no visual hallucinations   Data Reviewed:  Labs reviewed: Na 134, K 3.4, Cr 0.75, WBC 5.7, Hgb 8.9, Plts 201  Family Communication: Pt in room, family at bedside  Disposition: Status is: Inpatient Remains inpatient appropriate because: severity of illness  Planned Discharge Destination: Home with Home Health    Author: Garnette Pelt, MD 03/01/2024 4:06 PM  For on call review [email or web address].

## 2024-03-01 NOTE — Evaluation (Signed)
 Occupational Therapy Evaluation Patient Details Name: Regina Young MRN: [id number] DOB: 04-Aug-1956 Today's Date: 03/01/2024   History of Present Illness   68 yo female presents to therapy following hospital admission on 02/28/2024 due to elevated tempeture, SOB and AMS. Pt found to have acute respiratory failure secondary to CAP. 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     Clinical Impressions PTA, patient lives at home with husband and was mod I for all ADL's and mobility including driving and working as IT trainer. Currently, patient presents with deficits outlined below (See OT Problem List for details) most significantly mild pain, O2 dep with decreased activity tolerance and balance deficits. Patient requires continued Acute OT services to progress functional performance and safety and allow for discharge. Anticipate patient will require HHOT services and family assist and support upon discharge.      If plan is discharge home, recommend the following:   A little help with walking and/or transfers;A little help with bathing/dressing/bathroom;Assistance with cooking/housework;Direct supervision/assist for medications management;Direct supervision/assist for financial management;Assist for transportation;Help with stairs or ramp for entrance     Functional Status Assessment   Patient has had a recent decline in their functional status and demonstrates the ability to make significant improvements in function in a reasonable and predictable amount of time.     Equipment Recommendations   None recommended by OT      Precautions/Restrictions   Precautions Precautions: Fall Recall of Precautions/Restrictions: Intact Restrictions Weight Bearing Restrictions Per Provider Order: No     Mobility Bed Mobility Overal bed mobility:  (was amb to BR upon OT arrival and  remained up in recliner)                  Transfers Overall transfer level: Needs assistance Equipment used: Rolling walker (2 wheels), None Transfers: Sit to/from Stand, Bed to chair/wheelchair/BSC Sit to Stand: Contact guard assist     Step pivot transfers: Contact guard assist     General transfer comment: min cues for hand placement and RW navi      Balance Overall balance assessment: Mild deficits observed, not formally tested                                         ADL either performed or assessed with clinical judgement   ADL Overall ADL's : Needs assistance/impaired Eating/Feeding: Independent;Sitting   Grooming: Wash/dry hands;Wash/dry face;Oral care;Sitting;Set up   Upper Body Bathing: Set up;Sitting   Lower Body Bathing: Sit to/from stand;Contact guard assist   Upper Body Dressing : Set up;Sitting   Lower Body Dressing: Contact guard assist   Toilet Transfer: Contact guard assist;Grab bars;Rolling walker (2 wheels);Regular Toilet   Toileting- Clothing Manipulation and Hygiene: Set up;Sitting/lateral lean       Functional mobility during ADLs: Contact guard assist;Rolling walker (2 wheels) General ADL Comments: min cues for breathing integ and pacing     Vision Baseline Vision/History: 1 Wears glasses Ability to See in Adequate Light: 0 Adequate Patient Visual Report: No change from baseline Vision Assessment?: No apparent visual deficits;Wears glasses for reading;Wears glasses for driving            Pertinent Vitals/Pain Pain Assessment Pain Assessment: Faces Faces Pain Scale: Hurts a little bit Pain Location: R LE Pain Descriptors / Indicators:  Discomfort Pain Intervention(s): Monitored during session, Repositioned     Extremity/Trunk Assessment Upper Extremity Assessment Upper Extremity Assessment: Right hand dominant;Overall WFL for tasks assessed   Lower Extremity Assessment Lower Extremity Assessment:  Generalized weakness   Cervical / Trunk Assessment Cervical / Trunk Assessment: Normal   Communication Communication Communication: No apparent difficulties   Cognition Arousal: Alert Behavior During Therapy: WFL for tasks assessed/performed Cognition: No apparent impairments                               Following commands: Intact       Cueing  General Comments   Cueing Techniques: Verbal cues  93% on 3 ltrs O2           Home Living Family/patient expects to be discharged to:: Private residence Living Arrangements: Spouse/significant other Available Help at Discharge: Family Type of Home: House Home Access: Stairs to enter Secretary/administrator of Steps: 4 Entrance Stairs-Rails: Right Home Layout: One level     Bathroom Shower/Tub: Chief Strategy Officer: Standard Bathroom Accessibility: Yes How Accessible: Accessible via walker Home Equipment: Rolling Walker (2 wheels);Cane - single point;Shower seat;Hand held shower head          Prior Functioning/Environment Prior Level of Function : Independent/Modified Independent             Mobility Comments: pt reports limited community mobiltiy and need for RW for gait tasks first thing in the am and then no AD in home throughout the day. ADLs Comments: pt is mod I for ADLs and self care tasks, pt states husband or sister can provide A if needed    OT Problem List: Decreased activity tolerance;Impaired balance (sitting and/or standing);Cardiopulmonary status limiting activity;Pain   OT Treatment/Interventions: Self-care/ADL training;Therapeutic exercise;Energy conservation;DME and/or AE instruction;Therapeutic activities;Patient/family education;Balance training      OT Goals(Current goals can be found in the care plan section)   Acute Rehab OT Goals Patient Stated Goal: to go home stronger OT Goal Formulation: With patient/family Time For Goal Achievement: 03/15/24 Potential  to Achieve Goals: Good ADL Goals Pt Will Perform Lower Body Bathing: with modified independence;sit to/from stand Pt Will Perform Lower Body Dressing: with supervision;sit to/from stand Pt Will Transfer to Toilet: with supervision;ambulating;regular height toilet;grab bars Pt Will Perform Tub/Shower Transfer: Shower transfer;ambulating;shower seat;with contact guard assist Pt/caregiver will Perform Home Exercise Program: Both right and left upper extremity;Increased strength;With written HEP provided;Independently Additional ADL Goal #1: Patient will teach back and integrate 3/4 ECTs for ADL's and mobility with min cues   OT Frequency:  Min 2X/week    Co-evaluation              AM-PAC OT 6 Clicks Daily Activity     Outcome Measure Help from another person eating meals?: None Help from another person taking care of personal grooming?: A Little Help from another person toileting, which includes using toliet, bedpan, or urinal?: A Little Help from another person bathing (including washing, rinsing, drying)?: A Little Help from another person to put on and taking off regular upper body clothing?: None Help from another person to put on and taking off regular lower body clothing?: A Little 6 Click Score: 20   End of Session Equipment Utilized During Treatment: Gait belt;Rolling walker (2 wheels);Oxygen Nurse Communication: Mobility status;Other (comment) (NT and nurse re BM and voiding details)  Activity Tolerance: Patient tolerated treatment well Patient left: in chair;with call bell/phone within  reach;with chair alarm set;with family/visitor present  OT Visit Diagnosis: Unsteadiness on feet (R26.81);Muscle weakness (generalized) (M62.81)                Time: 8559-8495 OT Time Calculation (min): 24 min Charges:  OT General Charges $OT Visit: 1 Visit OT Evaluation $OT Eval Low Complexity: 1 Low OT Treatments $Self Care/Home Management : 8-22 mins Lumir Demetriou OT/L Acute  Rehabilitation Department  [phone]  03/01/2024, 3:51 PM

## 2024-03-01 NOTE — TOC Progression Note (Signed)
 Transition of Care [facility]) - Progression Note    Patient Details  Name: Regina Young MRN: [id number] Date of Birth: Sep 20, 1955  Transition of Care [facility]) CM/SW Contact  Munachimso Rigdon, Nathanel, RN Phone Number: 03/01/2024, 11:01 AM  Clinical Narrative:Spoke to patient/spouse again about recc for HHPT-they currently decline but will still discuss if needed before d/c,they are aware they can set up w/PCP. On 02-will monitor if needed @ home.       Expected Discharge Plan: Home/Self Care Barriers to Discharge: Continued Medical Work up  Expected Discharge Plan and Services                                               Social Determinants of Health (SDOH) Interventions SDOH Screenings   Food Insecurity: No Food Insecurity (02/28/2024)  Housing: Low Risk  (02/28/2024)  Transportation Needs: No Transportation Needs (02/29/2024)  Utilities: Not At Risk (02/29/2024)  Depression (PHQ2-9): Low Risk  (01/17/2024)  Financial Resource Strain: Low Risk  (01/10/2024)  Social Connections: Moderately Isolated (02/29/2024)  Tobacco Use: Medium Risk (02/28/2024)    Readmission Risk Interventions    02/29/2024   11:10 AM  Readmission Risk Prevention Plan  Transportation Screening Complete  PCP or Specialist Appt within 3-5 Days Complete  HRI or Home Care Consult Complete  Social Work Consult for Recovery Care Planning/Counseling Complete  Palliative Care Screening Complete  Medication Review Oceanographer) Complete

## 2024-03-01 NOTE — Evaluation (Signed)
 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) 01/10/2024   Past Surgical History:  Past Surgical History:  Procedure Laterality Date   ABDOMINAL HYSTERECTOMY  1999   Rolan March, MD   BIOPSY  01/01/2021   Procedure: BIOPSY;  Surgeon: Debby Hila, MD;  Location: [facility] ENDOSCOPY;  Service: Endoscopy;;   BRONCHIAL NEEDLE ASPIRATION BIOPSY  01/04/2024   Procedure: BRONCHOSCOPY, WITH NEEDLE ASPIRATION BIOPSY;  Surgeon: Shelah Lamar RAMAN, MD;  Location: [facility] ENDOSCOPY;  Service: Pulmonary;;   ENDOBRONCHIAL ULTRASOUND Right 01/04/2024   Procedure: ENDOBRONCHIAL ULTRASOUND (EBUS);  Surgeon: Shelah Lamar RAMAN, MD;  Location: [facility] ENDOSCOPY;  Service: Pulmonary;  Laterality: Right;   FLEXIBLE SIGMOIDOSCOPY N/A 01/01/2021   Procedure: FLEXIBLE SIGMOIDOSCOPY WITH BIOPSY;  Surgeon: Debby Hila, MD;  Location: [facility] ENDOSCOPY;  Service: Endoscopy;  Laterality: N/A;  IV SEDATION BY SURGEON   IR IMAGING GUIDED PORT INSERTION  02/22/2024   XI ROBOTIC ASSISTED LOWER ANTERIOR RESECTION N/A 02/21/2021   Procedure: XI ROBOTIC ASSISTED LOWER ANTERIOR RESECTION;  Surgeon: Debby Hila, MD;  Location: [facility] ORS;  Service: General;  Laterality: N/A;   HPI:  68 yo female adm to [facility] with respiratory difficulties and found to have pna.  Pt with PMH + for stage IV lung cancer s/p radiation and to start chemo today, GERD, DVT on Eliquis .  She had undergone endoscopy for removal  of a polyp in her esophagus, was found to have hiatal hernia otherwise esophagus was normal per note from [facility] August 2024.  Pt reports she was diagnosed with Barrett's esophagus in the past.   States she has been on a PPI for years - and denies any negative changes with her swallowing.     Swallow eval ordered.    Assessment / Plan / Recommendation  Clinical Impression  Functional oropharyngeal swallow noted without indication of aspiration or dysphagia across all po intake.  CN exam was negative - And pt easily passed 3 ounce Yale swallow screen and able to feed self.  NO oral retention noted with pt intake of chicken, potatoes, green beans, water = She does have h/o GERD and Barrett's esophagus and hital hernia. Eructation noted but pt denies refluxing - states this is new.  PPI use for years. Voice is mildly hoarse but pt and sister state it's close to baseline.Discussed compensation strategies for GERD, etc. Pt is to have repeat endoscopy in August 2025 and encourged her to continue this as planned.  Thanks for this consult. SLP Visit Diagnosis: Dysphagia, unspecified (R13.10)    Aspiration Risk  Mild aspiration risk    Diet Recommendation Regular;Thin liquid    Liquid Administration via: Cup;Straw Medication Administration: Whole meds with liquid Supervision: Patient able to self feed Compensations: Slow rate;Small sips/bites Postural Changes: Seated upright at 90 degrees;Remain upright for at least 30 minutes after po intake    Other  Recommendations Oral Care Recommendations: Oral care BID     Assistance Recommended at Discharge    Functional Status  Assessment Patient has not had a recent decline in their functional status  Frequency and Duration            Prognosis        Swallow Study   General Date of Onset: 03/01/24 HPI: 68 yo female adm to [facility] with respiratory difficulties and found to have pna.  Pt with PMH + for stage IV lung cancer s/p radiation and to start  chemo today, GERD, DVT on Eliquis .  She had undergone endoscopy for removal of a polyp in her esophagus, was found to have hiatal hernia otherwise esophagus was normal per note from [facility] August 2024.  Pt reports she was diagnosed with Barrett's esophagus in the past.   States she has been on a PPI for years - and denies any negative changes with her swallowing.     Swallow eval ordered. Diet Prior to this Study: Regular;Thin liquids (Level 0) Temperature Spikes Noted: No Respiratory Status: Nasal cannula History of Recent Intubation: No Behavior/Cognition: Alert;Cooperative;Pleasant mood Oral Cavity Assessment: Within Functional Limits Oral Care Completed by SLP: No Oral Cavity - Dentition: Adequate natural dentition Vision: Functional for self-feeding Self-Feeding Abilities: Able to feed self Patient Positioning: Upright in bed Baseline Vocal Quality: Hoarse Volitional Cough: Strong Volitional Swallow: Able to elicit    Oral/Motor/Sensory Function Overall Oral Motor/Sensory Function: Within functional limits   Ice Chips Ice chips: Not tested   Thin Liquid Thin Liquid: Within functional limits Presentation: Cup;Straw    Nectar Thick Nectar Thick Liquid: Not tested   Honey Thick Honey Thick Liquid: Not tested   Puree Puree: Within functional limits Presentation: Self Fed;Spoon   Solid     Solid: Within functional limits Presentation: Self Fed;Spoon      Nicolas Emmie Caldron 03/01/2024,12:57 PM  Madelin POUR, MS [facility] SLP Acute Rehab Services Office [phone]

## 2024-03-02 ENCOUNTER — Inpatient Hospital Stay (HOSPITAL_COMMUNITY)

## 2024-03-02 ENCOUNTER — Ambulatory Visit: Admitting: Medical Oncology

## 2024-03-02 ENCOUNTER — Encounter: Payer: Self-pay | Admitting: Radiation Oncology

## 2024-03-02 ENCOUNTER — Inpatient Hospital Stay

## 2024-03-02 DIAGNOSIS — J189 Pneumonia, unspecified organism: Secondary | ICD-10-CM | POA: Diagnosis not present

## 2024-03-02 DIAGNOSIS — R509 Fever, unspecified: Secondary | ICD-10-CM

## 2024-03-02 DIAGNOSIS — J9601 Acute respiratory failure with hypoxia: Secondary | ICD-10-CM | POA: Diagnosis not present

## 2024-03-02 DIAGNOSIS — C3491 Malignant neoplasm of unspecified part of right bronchus or lung: Secondary | ICD-10-CM | POA: Diagnosis not present

## 2024-03-02 LAB — COMPREHENSIVE METABOLIC PANEL WITH GFR
ALT: 45 U/L — ABNORMAL HIGH (ref 0–44)
AST: 37 U/L (ref 15–41)
Albumin: 2.2 g/dL — ABNORMAL LOW (ref 3.5–5.0)
Alkaline Phosphatase: 83 U/L (ref 38–126)
Anion gap: 9 (ref 5–15)
BUN: 12 mg/dL (ref 8–23)
CO2: 24 mmol/L (ref 22–32)
Calcium: 7.9 mg/dL — ABNORMAL LOW (ref 8.9–10.3)
Chloride: 104 mmol/L (ref 98–111)
Creatinine, Ser: 0.71 mg/dL (ref 0.44–1.00)
GFR, Estimated: >60 mL/min (ref >60)
Glucose, Bld: 96 mg/dL (ref 70–99)
Potassium: 4.3 mmol/L (ref 3.5–5.1)
Sodium: 137 mmol/L (ref 135–145)
Total Bilirubin: 0.7 mg/dL (ref 0.0–1.2)
Total Protein: 5.7 g/dL — ABNORMAL LOW (ref 6.5–8.1)

## 2024-03-02 LAB — CBC
HCT: 28.8 % — ABNORMAL LOW (ref 36.0–46.0)
Hemoglobin: 9 g/dL — ABNORMAL LOW (ref 12.0–15.0)
MCH: 29.1 pg (ref 26.0–34.0)
MCHC: 31.3 g/dL (ref 30.0–36.0)
MCV: 93.2 fL (ref 80.0–100.0)
Platelets: 240 10*3/uL (ref 150–400)
RBC: 3.09 MIL/uL — ABNORMAL LOW (ref 3.87–5.11)
RDW: 15.8 % — ABNORMAL HIGH (ref 11.5–15.5)
WBC: 6.4 10*3/uL (ref 4.0–10.5)
nRBC: 0 % (ref 0.0–0.2)

## 2024-03-02 MED ORDER — DIPHENHYDRAMINE-ZINC ACETATE 2-0.1 % EX CREA
TOPICAL_CREAM | Freq: Every day | CUTANEOUS | Status: DC | PRN
  Filled 2024-03-02: qty 28

## 2024-03-02 MED ORDER — BOOST / RESOURCE BREEZE PO LIQD CUSTOM
1.0000 | Freq: Three times a day (TID) | ORAL | Status: DC
  Administered 2024-03-02 – 2024-03-07 (×11): 1 via ORAL

## 2024-03-02 MED ORDER — IOHEXOL 350 MG/ML SOLN
75.0000 mL | Freq: Once | INTRAVENOUS | Status: AC | PRN
  Administered 2024-03-02: 75 mL via INTRAVENOUS

## 2024-03-02 MED ORDER — GABAPENTIN 400 MG PO CAPS
400.0000 mg | ORAL_CAPSULE | Freq: Three times a day (TID) | ORAL | Status: DC
  Administered 2024-03-02 – 2024-03-10 (×26): 400 mg via ORAL
  Filled 2024-03-02 (×26): qty 1

## 2024-03-02 NOTE — Progress Notes (Signed)
   03/02/24 0916  Assess: MEWS Score  Temp 98.7 F (37.1 C)  BP 138/68  MAP (mmHg) 88  Pulse Rate (!) 117  Resp 20  Level of Consciousness Alert  SpO2 95 %  O2 Device Nasal Cannula  O2 Flow Rate (L/min) 5 L/min  Assess: MEWS Score  MEWS Temp 0  MEWS Systolic 0  MEWS Pulse 2  MEWS RR 0  MEWS LOC 0  MEWS Score 2  MEWS Score Color Yellow  Assess: if the MEWS score is Yellow or Red  Were vital signs accurate and taken at a resting state? Yes  Does the patient meet 2 or more of the SIRS criteria? No  Does the patient have a confirmed or suspected source of infection? Yes  MEWS guidelines implemented  No, previously yellow, continue vital signs every 4 hours  Assess: SIRS CRITERIA  SIRS Temperature  0  SIRS Respirations  0  SIRS Pulse 1  SIRS WBC 0  SIRS Score Sum  1

## 2024-03-02 NOTE — TOC Progression Note (Signed)
 Transition of Care [facility]) - Progression Note    Patient Details  Name: Regina Young MRN: [id number] Date of Birth: [birth year]  Transition of Care [facility]) CM/SW Contact  Feliberto Stockley, Nathanel, RN Phone Number: 03/02/2024, 2:12 PM  Clinical Narrative: Patient/spouse will let me know if agree to [facility] check back to discuss acceptance to set up;[employer] rep Jermaine for home 02 if needed.      Expected Discharge Plan: Home w Home Health Services Barriers to Discharge: Continued Medical Work up  Expected Discharge Plan and Services   Discharge Planning Services: CM Consult Post Acute Care Choice: Home Health Living arrangements for the past 2 months: Single Family Home                                       Social Determinants of Health (SDOH) Interventions SDOH Screenings   Food Insecurity: No Food Insecurity (02/28/2024)  Housing: Low Risk  (02/28/2024)  Transportation Needs: No Transportation Needs (02/29/2024)  Utilities: Not At Risk (02/29/2024)  Depression (PHQ2-9): Low Risk  (01/17/2024)  Financial Resource Strain: Low Risk  (01/10/2024)  Social Connections: Moderately Isolated (02/29/2024)  Tobacco Use: Medium Risk (02/28/2024)    Readmission Risk Interventions    02/29/2024   11:10 AM  Readmission Risk Prevention Plan  Transportation Screening Complete  PCP or Specialist Appt within 3-5 Days Complete  HRI or Home Care Consult Complete  Social Work Consult for Recovery Care Planning/Counseling Complete  Palliative Care Screening Complete  Medication Review Oceanographer) Complete

## 2024-03-02 NOTE — Progress Notes (Signed)
  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 Patient placed on empiric IV antibiotics. Patient also seen in consultation by hematology/oncology.   Assessment and Plan: 1 acute hypoxic respiratory failure likely secondary to community-acquired pneumonia - Patient noted to have presented with fevers as high as 102.9, nausea, headache, right lower extremity pain. - Patient noted to be compliant with anticoagulation with Eliquis . - Chest x-ray done with bilateral upper lobe opacities concerning for infiltrate versus edema. - Patient currently with sats of 94 to 97% on 3 L nasal cannula. - Blood cultures pending with, remains with no growth to date. - SARS coronavirus 2 PCR negative - Influenza A and B by PCR negative - RSV by PCR negative. - Place on PPI, Mucinex, Claritin, Flonase. - Continue empiric IV azithromycin and IV Rocephin. - Continued with fevers overnight and needing more O2 this AM -Ordered CTA chest, pending   2.  Dehydration -IV fluids.   3.  Community-acquired pneumonia -Noted on chest x-ray bilateral upper lobe opacities. - Placed on PPI, Mucinex, Claritin, Flonase. - Continue empiric IV Rocephin and IV azithromycin. -repeat CXR reviewed, unchanged -CTA chest was ordered, pending   4.  Hyponatremia -Felt likely secondary to a hypovolemic hyponatremia secondary to dehydration in the setting of an acute infection. - Improved with hydration.   5.  History of DVT -continue Eliquis .   6.  History of lung cancer with metastases -Continue home dose of MS Contin , oxycodone  as needed for breakthrough pain. - Patient being followed  by Dr. Timmy per oncology.   7.  Constipation -MiraLAX  daily. - Senokot-S twice daily. - Dulcolax suppository x 1.   8.  Transaminitis -Minimal and trending down. - Likely secondary to acute infection. - improved      Subjective: Needing more O2 overnight and fevers  Physical Exam: Vitals:   03/02/24 0916 03/02/24 1253 03/02/24 1337 03/02/24 1455  BP: 138/68 135/73 132/68 137/74  Pulse: (!) 117 (!) 116  (!) 126  Resp: 20 20 18 20   Temp: 98.7 F (37.1 C) 100.2 F (37.9 C) (!) 102.3 F (39.1 C) 100.2 F (37.9 C)  TempSrc:   Oral Oral  SpO2: 95% 93% 93% 90%  Weight:      Height:       General exam: Conversant, in no acute distress Respiratory system: normal chest rise, clear, no audible wheezing Cardiovascular system: regular rhythm, s1-s2 Gastrointestinal system: Nondistended, nontender, pos BS Central nervous system: No seizures, no tremors Extremities: No cyanosis, no joint deformities Skin: No rashes, no pallor Psychiatry: Affect normal // no auditory hallucinations   Data Reviewed:  Labs reviewed: Na 137, K 4.3, Cr 0.71, WBC 6.4, Hgb 9.0, Plts 240  Family Communication: Pt in room, family at bedside  Disposition: Status is: Inpatient Remains inpatient appropriate because: severity of illness  Planned Discharge Destination: Home with Home Health    Author: Garnette Pelt, MD 03/02/2024 4:29 PM  For on call review [email or web address].

## 2024-03-02 NOTE — Progress Notes (Signed)
 [name] is making some progress.  She is still wearing oxygen.  She is having some sweats.  Hopefully, this is a sign that the pneumonia is breaking up.  She is using the incentive spirometer.  Her labs show a white cell count 6.4.  Hemoglobin 9.  Platelet count 240,000.  Her sodium 137.  Potassium 4.3.  BUN 12 creatinine 0.61.  Calcium  7.9 with an albumin of 2.2.  She is getting out of bed.  She still has a problem with the radicular pain in the right leg.  I will increase her gabapentin  a little bit.  Again, from my point of view, nutritions can be critical.  We really need to try to get calories into her.  Cultures are all negative.  She continues on antibiotics.  She does not have any diarrhea.  Her vital signs are temperature of 100.2.  Yesterday, Tmax was 103.1.  Her leg pressure is 132/71.  Pulse is 109.  Her oral exam does not show any mucositis.  Her lungs sound pretty clear bilaterally.  She has no wheezes.  Cardiac exam is tachycardic but regular.  Abdomen soft.  Bowel sounds are present.  There is no fluid wave.  There is no palpable liver or spleen tip.  Extremities shows no clubbing, cyanosis or edema.  Neurological exam is nonfocal.   Regina Young has a pneumonia.  She is on antibiotics for this.  She did have a temperature spike yesterday.  Cultures are negative.  Again, in my opinion, nutrition is vital.  We need to try to optimize what she can take in.  Hopefully, she will still be able to go home in a few days.  I do appreciate the great care she is getting from everybody on 4 E.   Jeralyn Crease, MD  Psalm 147:3.

## 2024-03-02 NOTE — Progress Notes (Signed)
 Physical Therapy Treatment Patient Details Name: Regina Young MRN: [id number] DOB: 23-Apr-1956 Today's Date: 03/02/2024   History of Present Illness 68 yo female presents to therapy following hospital admission on 02/28/2024 due to elevated tempeture, SOB and AMS. Pt found to have acute respiratory failure secondary to CAP. 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    PT Comments  Pt in bed, planning to leave for [place] soon, agreeable to session in the interim. She reports that she has a ramp entrance which her husband is finishing today, RW at home, 1 level home. Her husband assists prn with tasks at home and is supportive 24/7 for needs/safety. Pt able to amb x170ft with RW in hallway at supervision A. Maintains standing balance during rest break and returns to room. She reports she needs to use the bathroom and nursing staff present to assist with transport prep to CT.    If plan is discharge home, recommend the following: A little help with walking and/or transfers;A little help with bathing/dressing/bathroom;Assistance with cooking/housework;Help with stairs or ramp for entrance;Assist for transportation   Can travel by private vehicle        Equipment Recommendations  None recommended by PT    Recommendations for Other Services       Precautions / Restrictions Precautions Precautions: Fall Restrictions Weight Bearing Restrictions Per Provider Order: No     Mobility  Bed Mobility Overal bed mobility: Modified Independent             General bed mobility comments: sit to supine to sit with use of hospital bed rails    Transfers Overall transfer level: Needs assistance Equipment used: Rolling walker (2 wheels) Transfers: Sit to/from Stand Sit to Stand: Contact guard assist           General transfer comment: min cues for hand placement     Ambulation/Gait Ambulation/Gait assistance: Contact guard assist Gait Distance (Feet): 150 Feet Assistive device: Rolling walker (2 wheels) Gait Pattern/deviations: Step-through pattern, Antalgic Gait velocity: decreased     General Gait Details: dec sensation in RLE which leads to dec tolerance in WB, demos inc weigth shift during steps. able to navigate smaller spaces with walker and complete straight line navigation with dec endurance in hallway. rec use of RW at all times OOB   Stairs             Wheelchair Mobility     Tilt Bed    Modified Rankin (Stroke Patients Only)       Balance Overall balance assessment: Mild deficits observed, not formally tested                                          Communication    Cognition Arousal: Alert Behavior During Therapy: WFL for tasks assessed/performed   PT - Cognitive impairments: No apparent impairments                         Following commands: Intact      Cueing Cueing Techniques: Verbal cues  Exercises      General Comments General comments (skin integrity, edema, etc.): 4L O2 via [place]      Pertinent Vitals/Pain Pain Assessment Pain Assessment: No/denies pain    Home Living  Prior Function            PT Goals (current goals can now be found in the care plan section) Acute Rehab PT Goals Patient Stated Goal: to get stronger, go home without supplemental O2, start chemo PT Goal Formulation: With patient Time For Goal Achievement: 03/14/24 Potential to Achieve Goals: Good Progress towards PT goals: Progressing toward goals    Frequency    Min 3X/week      PT Plan      Co-evaluation              AM-PAC PT 6 Clicks Mobility   Outcome Measure  Help needed turning from your back to your side while in a flat bed without using bedrails?: None Help needed moving from lying on your back to sitting on the side of a flat  bed without using bedrails?: A Little Help needed moving to and from a bed to a chair (including a wheelchair)?: A Little Help needed standing up from a chair using your arms (e.g., wheelchair or bedside chair)?: A Little Help needed to walk in hospital room?: A Little Help needed climbing 3-5 steps with a railing? : A Little 6 Click Score: 19    End of Session Equipment Utilized During Treatment: Gait belt;Oxygen Activity Tolerance: Patient tolerated treatment well;No increased pain Patient left: with nursing/sitter in room (in BR preapring for CT) Nurse Communication: Mobility status PT Visit Diagnosis: Unsteadiness on feet (R26.81);Muscle weakness (generalized) (M62.81);Difficulty in walking, not elsewhere classified (R26.2)     Time: 8674-8640 PT Time Calculation (min) (ACUTE ONLY): 34 min  Charges:    $Gait Training: 23-37 mins PT General Charges $$ ACUTE PT VISIT: 1 Visit                     Stann, PT Acute Rehabilitation Services Office: [phone] 03/02/2024    Stann DELENA Ohara 03/02/2024, 5:20 PM

## 2024-03-03 ENCOUNTER — Encounter: Payer: Self-pay | Admitting: *Deleted

## 2024-03-03 ENCOUNTER — Ambulatory Visit

## 2024-03-03 DIAGNOSIS — J189 Pneumonia, unspecified organism: Secondary | ICD-10-CM | POA: Diagnosis not present

## 2024-03-03 DIAGNOSIS — C3491 Malignant neoplasm of unspecified part of right bronchus or lung: Secondary | ICD-10-CM | POA: Diagnosis not present

## 2024-03-03 DIAGNOSIS — J9601 Acute respiratory failure with hypoxia: Secondary | ICD-10-CM | POA: Diagnosis not present

## 2024-03-03 DIAGNOSIS — R509 Fever, unspecified: Secondary | ICD-10-CM | POA: Diagnosis not present

## 2024-03-03 LAB — RESPIRATORY PANEL BY PCR

## 2024-03-03 LAB — COMPREHENSIVE METABOLIC PANEL WITH GFR
ALT: 73 U/L — ABNORMAL HIGH (ref 0–44)
AST: 61 U/L — ABNORMAL HIGH (ref 15–41)
Albumin: 1.9 g/dL — ABNORMAL LOW (ref 3.5–5.0)
Alkaline Phosphatase: 107 U/L (ref 38–126)
Anion gap: 9 (ref 5–15)
BUN: 17 mg/dL (ref 8–23)
CO2: 24 mmol/L (ref 22–32)
Calcium: 7.6 mg/dL — ABNORMAL LOW (ref 8.9–10.3)
Chloride: 102 mmol/L (ref 98–111)
Creatinine, Ser: 0.71 mg/dL (ref 0.44–1.00)
GFR, Estimated: >60 mL/min (ref >60)
Glucose, Bld: 95 mg/dL (ref 70–99)
Potassium: 3.7 mmol/L (ref 3.5–5.1)
Sodium: 135 mmol/L (ref 135–145)
Total Bilirubin: 0.6 mg/dL (ref 0.0–1.2)
Total Protein: 5.8 g/dL — ABNORMAL LOW (ref 6.5–8.1)

## 2024-03-03 LAB — CBC
HCT: 28.5 % — ABNORMAL LOW (ref 36.0–46.0)
Hemoglobin: 8.8 g/dL — ABNORMAL LOW (ref 12.0–15.0)
MCH: 29.3 pg (ref 26.0–34.0)
MCHC: 30.9 g/dL (ref 30.0–36.0)
MCV: 95 fL (ref 80.0–100.0)
Platelets: 309 10*3/uL (ref 150–400)
RBC: 3 MIL/uL — ABNORMAL LOW (ref 3.87–5.11)
RDW: 15.8 % — ABNORMAL HIGH (ref 11.5–15.5)
WBC: 6 10*3/uL (ref 4.0–10.5)
nRBC: 0 % (ref 0.0–0.2)

## 2024-03-03 MED ORDER — IPRATROPIUM-ALBUTEROL 0.5-2.5 (3) MG/3ML IN SOLN
3.0000 mL | Freq: Four times a day (QID) | RESPIRATORY_TRACT | Status: DC
  Administered 2024-03-03 – 2024-03-06 (×13): 3 mL via RESPIRATORY_TRACT
  Filled 2024-03-03 (×12): qty 3

## 2024-03-03 MED ORDER — OXYCODONE HCL 5 MG PO TABS
5.0000 mg | ORAL_TABLET | ORAL | Status: DC | PRN
  Administered 2024-03-03 – 2024-03-04 (×2): 5 mg via ORAL
  Administered 2024-03-05 – 2024-03-07 (×4): 10 mg via ORAL
  Administered 2024-03-08 – 2024-03-10 (×3): 5 mg via ORAL
  Filled 2024-03-03: qty 2
  Filled 2024-03-03 (×2): qty 1
  Filled 2024-03-03 (×2): qty 2
  Filled 2024-03-03 (×2): qty 1
  Filled 2024-03-03: qty 2
  Filled 2024-03-03 (×2): qty 1

## 2024-03-03 MED ORDER — POLYETHYLENE GLYCOL 3350 17 G PO PACK: 17.0000 g | PACK | Freq: Every day | ORAL | Status: DC | PRN

## 2024-03-03 MED ORDER — POTASSIUM CHLORIDE CRYS ER 20 MEQ PO TBCR
40.0000 meq | EXTENDED_RELEASE_TABLET | Freq: Once | ORAL | Status: AC
Start: 2024-03-03 — End: 2024-03-03
  Administered 2024-03-03: 40 meq via ORAL
  Filled 2024-03-03: qty 2

## 2024-03-03 MED ORDER — FLUTICASONE PROPIONATE 50 MCG/ACT NA SUSP
2.0000 | Freq: Every day | NASAL | Status: DC
  Administered 2024-03-03 – 2024-03-10 (×8): 2 via NASAL
  Filled 2024-03-03: qty 16

## 2024-03-03 NOTE — Progress Notes (Signed)
 Patient remains hospitalized with continued fevers. Her husband call with questions about home health PT. He wanted to know if Dr Timmy would suggest PT, and whether or not he should accept it from the hospital or have Dr Timmy order it. It was highly recommended for husband to accept home PT from the hospital. Explained that it was a more streamlined process from the inpatient team, and that if later they determined they didn't want it, they could request to stop. Home Health PT from our office would be more cumbersome and talk longer to initiate. He understood.   Oncology Nurse Navigator Documentation     03/03/2024    8:45 AM  Oncology Nurse Navigator Flowsheets  Navigator Follow Up Date: 03/09/2024  Navigator Follow Up Reason: Appointment Review  Navigator Location [facility]  Navigator Encounter Type Appt/Treatment Plan Review  Patient Visit Type MedOnc  Treatment Phase Active Tx  Barriers/Navigation Needs Coordination of Care;Education  Interventions Education  Acuity Level 2-Minimal Needs (1-2 Barriers Identified)  Education Method Verbal  Support Groups/Services Friends and Family  Time Spent with Patient 15

## 2024-03-03 NOTE — Progress Notes (Signed)
 Regina Young had a CT angiogram of the chest yesterday.  There is no pulmonary embolism.  She did have some new infiltrates.  This may be her pneumonia.  She is worried that she is just not getting oxygen because of congestion in her nares.  She may need to have some nasal spray to try to help open up her nasal passage.  She is still having temperature spikes.  Yesterday her Tmax was 1-2.3.  She is eating okay.  She has had no nausea or vomiting.  Her husband is trying to get her to eat more protein.  She has had no diarrhea.  She is out of bed a little bit.  She still has a radicular pain with the right foot.  Again this is from the sacral metastasis.  She had radiotherapy for this already.  Her hide chemistries show sodium 135.  Potassium 3.7.  BUN 17 creatinine 0.71.  Calcium  7.6 with an albumin of 1.9.  SGPT is 73 SGOT 61.  Her white cell count is 6.0.  Hemoglobin 8.8.  Platelet count 309,000.  She has had no rash.  She has had no leg swelling.  She is on anticoagulation right now.  So far, cultures are negative.  She is on azithromycin  and Rocephin .  Her vital signs show temperature of 99.1.  Pulse 121.  Blood pressure 131/83.  Her head and neck exam shows no ocular or oral lesions.  There are no palpable cervical or supraclavicular lymph nodes.  Lungs sound clear on the left side.  She does have some decrease over on the right side.  She has some wheezes bilaterally.  Cardiac exam is tachycardic but regular.  Abdomen is soft.  Bowel sounds are present.  There is no fluid wave.  Extremity shows no clubbing, cyanosis or edema.  Neurological exam is nonfocal.   She has pneumonia.  She has metastatic non-small cell lung cancer.  She is on antibiotics.  I probably would change her nebulizer to DuoNeb.  I will try her on a Flonase nasal spray.  I still think she is going need to be in the hospital for several more days.  She still seems to desaturate.  She does have some  tachycardia.   Jeralyn Crease, MD

## 2024-03-03 NOTE — Plan of Care (Signed)

## 2024-03-03 NOTE — Progress Notes (Addendum)
  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 Patient placed on empiric IV antibiotics. Patient also seen in consultation by hematology/oncology.   Assessment and Plan: 1 acute hypoxic respiratory failure likely secondary to community-acquired pneumonia - Patient noted to have presented with fevers as high as 102.9, nausea, headache, right lower extremity pain. - Patient noted to be compliant with anticoagulation with Eliquis . - Chest x-ray done with bilateral upper lobe opacities concerning for infiltrate versus edema. - Patient currently with sats of 94 to 97% on 3 L nasal cannula. - Blood cultures pending with, remains with no growth to date. - SARS coronavirus 2 PCR negative - Influenza A and B by PCR negative - RSV by PCR negative. - Place on PPI, Mucinex, Claritin, Flonase. - Continue empiric IV azithromycin  and IV Rocephin . - Continued with fevers overnight and needing more O2 this AM -Ordered CTA chest neg for PE, confirms pneumonia -Will check respiratory viral panel to r/o other viral pathogens   2.  Dehydration -IV fluids.   3.  Community-acquired pneumonia -Noted on chest x-ray bilateral upper lobe opacities. - Placed on PPI, Mucinex, Claritin, Flonase. - Continue empiric IV Rocephin  and IV azithromycin . -repeat CXR reviewed, unchanged -CTA chest with findings as per above   4.  Hyponatremia -Felt likely secondary to a hypovolemic hyponatremia secondary to dehydration in the setting of an acute infection. - Improved with hydration.   5.  History of DVT -continue Eliquis .   6.  History of lung cancer with  metastases -Continue home dose of MS Contin , oxycodone  as needed for breakthrough pain. - Patient being followed by Dr. Timmy per oncology.   7.  Constipation -MiraLAX  daily. - Senokot-S twice daily. - Dulcolax suppository x 1.   8.  Transaminitis -Minimal and trending down. - Likely secondary to acute infection. - improved      Subjective: Needed more O2 overnight. Reports malaise  Physical Exam: Vitals:   03/03/24 1329 03/03/24 1610 03/03/24 1705 03/03/24 1814  BP:   113/67 119/69  Pulse:   (!) 133 (!) 128  Resp:   20 19  Temp:   99.4 F (37.4 C) (!) 100.6 F (38.1 C)  TempSrc:   Oral Oral  SpO2: (!) 89% (!) 89% 93% 93%  Weight:      Height:       General exam: Awake, laying in bed, in nad Respiratory system: Normal respiratory effort, no wheezing Cardiovascular system: regular rate, s1, s2 Gastrointestinal system: Soft, nondistended, positive BS Central nervous system: CN2-12 grossly intact, strength intact Extremities: Perfused, no clubbing Skin: Normal skin turgor, no notable skin lesions seen Psychiatry: Mood normal // no visual hallucinations   Data Reviewed:  Labs reviewed: Na 135, K 3.7, Cr 0.71, WBC 6.0, Hgb 8.8, Plts 309  Family Communication: Pt in room, family at bedside  Disposition: Status is: Inpatient Remains inpatient appropriate because: severity of illness  Planned Discharge Destination: Home with Home Health    Author: Garnette Pelt, MD 03/03/2024 6:23 PM  For on call review [email or web address].

## 2024-03-03 NOTE — TOC Progression Note (Signed)
 Transition of Care [facility]) - Progression Note    Patient Details  Name: Regina Young MRN: [id number] Date of Birth: 1956/02/07  [facility] CM/SW Contact  Rowdy Guerrini, Nathanel, RN Phone Number: 03/03/2024, 9:25 AM  Clinical Narrative: Spoke to spouse about HHC-patient/spouse agree to HHPT/OT no preference-[employer] rep Arna accepted for HHPT/OT. May need home 02-await sats, & order-[employer] rep Jermaine following for home 02.Has own transport home.      Expected Discharge Plan: Home w Home Health Services Barriers to Discharge: Continued Medical Work up  Expected Discharge Plan and Services   Discharge Planning Services: CM Consult Post Acute Care Choice: Home Health Living arrangements for the past 2 months: Single Family Home                                       Social Determinants of Health (SDOH) Interventions SDOH Screenings   Food Insecurity: No Food Insecurity (02/28/2024)  Housing: Low Risk  (02/28/2024)  Transportation Needs: No Transportation Needs (02/29/2024)  Utilities: Not At Risk (02/29/2024)  Depression (PHQ2-9): Low Risk  (01/17/2024)  Financial Resource Strain: Low Risk  (01/10/2024)  Social Connections: Moderately Isolated (02/29/2024)  Tobacco Use: Medium Risk (02/28/2024)    Readmission Risk Interventions    02/29/2024   11:10 AM  Readmission Risk Prevention Plan  Transportation Screening Complete  PCP or Specialist Appt within 3-5 Days Complete  HRI or Home Care Consult Complete  Social Work Consult for Recovery Care Planning/Counseling Complete  Palliative Care Screening Complete  Medication Review Oceanographer) Complete

## 2024-03-04 ENCOUNTER — Inpatient Hospital Stay (HOSPITAL_COMMUNITY)

## 2024-03-04 DIAGNOSIS — R509 Fever, unspecified: Secondary | ICD-10-CM | POA: Diagnosis not present

## 2024-03-04 DIAGNOSIS — J189 Pneumonia, unspecified organism: Secondary | ICD-10-CM | POA: Diagnosis not present

## 2024-03-04 DIAGNOSIS — R918 Other nonspecific abnormal finding of lung field: Secondary | ICD-10-CM

## 2024-03-04 DIAGNOSIS — J9601 Acute respiratory failure with hypoxia: Secondary | ICD-10-CM | POA: Diagnosis not present

## 2024-03-04 LAB — COMPREHENSIVE METABOLIC PANEL WITH GFR
ALT: 192 U/L — ABNORMAL HIGH (ref 0–44)
AST: 192 U/L — ABNORMAL HIGH (ref 15–41)
Albumin: 1.9 g/dL — ABNORMAL LOW (ref 3.5–5.0)
Alkaline Phosphatase: 135 U/L — ABNORMAL HIGH (ref 38–126)
Anion gap: 9 (ref 5–15)
BUN: 19 mg/dL (ref 8–23)
CO2: 24 mmol/L (ref 22–32)
Calcium: 7.9 mg/dL — ABNORMAL LOW (ref 8.9–10.3)
Chloride: 102 mmol/L (ref 98–111)
Creatinine, Ser: 0.67 mg/dL (ref 0.44–1.00)
GFR, Estimated: >60 mL/min (ref >60)
Glucose, Bld: 103 mg/dL — ABNORMAL HIGH (ref 70–99)
Potassium: 4.5 mmol/L (ref 3.5–5.1)
Sodium: 135 mmol/L (ref 135–145)
Total Bilirubin: 0.6 mg/dL (ref 0.0–1.2)
Total Protein: 5.7 g/dL — ABNORMAL LOW (ref 6.5–8.1)

## 2024-03-04 LAB — CULTURE, BLOOD (ROUTINE X 2)
Culture: NO GROWTH
Culture: NO GROWTH
Special Requests: ADEQUATE
Special Requests: ADEQUATE

## 2024-03-04 LAB — CBC
HCT: 26.9 % — ABNORMAL LOW (ref 36.0–46.0)
Hemoglobin: 8.2 g/dL — ABNORMAL LOW (ref 12.0–15.0)
MCH: 28.9 pg (ref 26.0–34.0)
MCHC: 30.5 g/dL (ref 30.0–36.0)
MCV: 94.7 fL (ref 80.0–100.0)
Platelets: 299 10*3/uL (ref 150–400)
RBC: 2.84 MIL/uL — ABNORMAL LOW (ref 3.87–5.11)
RDW: 15.8 % — ABNORMAL HIGH (ref 11.5–15.5)
WBC: 6.2 10*3/uL (ref 4.0–10.5)
nRBC: 0 % (ref 0.0–0.2)

## 2024-03-04 LAB — MRSA NEXT GEN BY PCR, NASAL: MRSA by PCR Next Gen: NOT DETECTED

## 2024-03-04 MED ORDER — SODIUM CHLORIDE 0.9 % IV SOLN
2.0000 g | Freq: Two times a day (BID) | INTRAVENOUS | Status: DC
  Administered 2024-03-04 – 2024-03-10 (×12): 2 g via INTRAVENOUS
  Filled 2024-03-04 (×12): qty 12.5

## 2024-03-04 MED ORDER — VANCOMYCIN HCL 750 MG/150ML IV SOLN: 750.0000 mg | INTRAVENOUS | Status: DC

## 2024-03-04 MED ORDER — VANCOMYCIN HCL IN DEXTROSE 1-5 GM/200ML-% IV SOLN
1000.0000 mg | Freq: Once | INTRAVENOUS | Status: AC
  Administered 2024-03-04: 1000 mg via INTRAVENOUS
  Filled 2024-03-04: qty 200

## 2024-03-04 NOTE — Progress Notes (Signed)
   03/04/24 2245  BiPAP/CPAP/SIPAP  $ Non-Invasive Ventilator  Non-Invasive Vent Initial  $ Face Mask Small Yes  BiPAP/CPAP/SIPAP Pt Type Adult  BiPAP/CPAP/SIPAP V60  Mask Type Full face mask  Mask Size Small  Respiratory Rate 18 breaths/min  EPAP 12 cmH2O  FiO2 (%) 40 %  Minute Ventilation 6.4  Leak 34  Peak Inspiratory Pressure (PIP) 12  Tidal Volume (Vt) 487  Patient Home Machine No  Patient Home Mask No  Patient Home Tubing No  Auto Titrate No  Press High Alarm 35 cmH2O  Press Low Alarm 5 cmH2O  Nasal massage performed Yes  CPAP/SIPAP surface wiped down Yes  Device Plugged into RED Power Outlet Yes  BiPAP/CPAP /SiPAP Vitals  Resp 18  SpO2 96 %  MEWS Score/Color  MEWS Score 0  MEWS Score Color Landy

## 2024-03-04 NOTE — Progress Notes (Addendum)
 Pharmacy Antibiotic Note  Regina Young is a 68 y.o. female admitted on 02/28/2024 with pneumonia.  Pharmacy has been consulted for Vancomycin  and Cefepime  dosing.  Plan: Ceftriaxone /Azithromycin  d/c'ed Cefepime  2gm IV q12h Vancomycin  1g IV x 1 dose followed by Vancomycin  750 mg IV Q 24 hrs. Goal AUC 400-550.  Expected AUC: 425.1  SCr used: 0.8 (rounded up from 0.67) Follow renal function   Height: 5' 2 (157.5 cm) Weight: 48.6 kg (107 lb 2.3 oz) IBW/kg (Calculated) : 50.1  Temp (24hrs), Avg:99.4 F (37.4 C), Min:97.9 F (36.6 C), Max:101.1 F (38.4 C)  Recent Labs  Lab 02/28/24 1140 02/29/24 0523 03/01/24 0334 03/02/24 0342 03/03/24 0339 03/04/24 0327  WBC  --  7.0 5.7 6.4 6.0 6.2  CREATININE  --  0.70 0.75 0.71 0.71 0.67  LATICACIDVEN 0.8  --   --   --   --   --     Estimated Creatinine Clearance: 53.8 mL/min (by C-G formula based on SCr of 0.67 mg/dL).    No Known Allergies  Antimicrobials this admission: 6/23 Ceftriaxone  >> 6/28 6/23 Azithromycin  >> 6/28 6/28 Cefepime  >> 6/28 Vancomycin  >>  Dose adjustments this admission:    Microbiology results: 6/27 Resp Panel: negative    Thank you for allowing pharmacy to be a part of this patient's care.  Kemp Arvin Fletcher, PharmD 03/04/2024 2:52 PM

## 2024-03-04 NOTE — Progress Notes (Signed)
   03/04/24 0547  Vitals  Temp (!) 101.1 F (38.4 C)  Temp Source Oral  BP 125/64  MAP (mmHg) 81  BP Location Left Arm  BP Method Automatic  Patient Position (if appropriate) Lying  Pulse Rate (!) 127  Pulse Rate Source Monitor  Resp 18  Level of Consciousness  Level of Consciousness Alert  MEWS COLOR  MEWS Score Color Yellow  Oxygen Therapy  SpO2 (!) 87 %  O2 Device Nasal Cannula  O2 Flow Rate (L/min) 7 L/min  MEWS Score  MEWS Temp 1  MEWS Systolic 0  MEWS Pulse 2  MEWS RR 0  MEWS LOC 0  MEWS Score 3   Tylenol  PO Prn Given. Ice packs placed. Spo2 stays in 85-88% at 02 @ 7l/m. Placed to HFNC @ 7L/M o2 sat went up to 92%. Will continue to monitor.

## 2024-03-04 NOTE — Progress Notes (Signed)
  Progress Note   Patient: Regina Young DOB: 28-Oct-1955 DOA: 02/28/2024     5 DOS: the patient was seen and examined on 03/04/2024   Brief hospital course: 68 year old female history of metastatic lung cancer, DVT on chronic anticoagulation with Eliquis , GERD, hyperlipidemia presented to the hospital acute hypoxic respiratory failure secondary to community-acquired pneumonia. Patient placed on empiric IV antibiotics. Patient also seen in consultation by hematology/oncology.   Assessment and Plan: 1 acute hypoxic respiratory failure likely secondary to community-acquired pneumonia - Patient noted to have presented with fevers as high as 102.9, nausea, headache, right lower extremity pain. - Patient noted to be compliant with anticoagulation with Eliquis . - Chest x-ray done with bilateral upper lobe opacities concerning for infiltrate versus edema. - Blood cultures with no growth to date. - SARS coronavirus 2 PCR negative - Influenza A and B by PCR negative - RSV by PCR negative. - Resp viral panel neg - Place on PPI, Mucinex, Claritin, Flonase. - No improvement with IV azithromycin  and IV Rocephin . - Appreciate Pulmonary input. Recs for BiPAP at night with transition to HCAP regimen of vanc and cefepime    2.  Dehydration -IV fluids.   3.  Community-acquired pneumonia -Noted on chest x-ray bilateral upper lobe opacities. - Placed on PPI, Mucinex, Claritin, Flonase. - Continue empiric IV Rocephin  and IV azithromycin . -repeat CXR reviewed, unchanged -CTA chest with findings as per above   4.  Hyponatremia -Felt likely secondary to a hypovolemic hyponatremia secondary to dehydration in the setting of an acute infection. - Improved with hydration.   5.  History of DVT -continue Eliquis .   6.  History of lung cancer with metastases -Continue home dose of MS Contin , oxycodone  as needed for breakthrough pain. - Patient being followed by Dr. Timmy per oncology.   7.   Constipation resolved -MiraLAX  prn   8.  Transaminitis -Minimal and trending down. - Likely secondary to acute infection. - improved      Subjective: With increased O2 requirements and fevers overnight  Physical Exam: Vitals:   03/04/24 1317 03/04/24 1350 03/04/24 1521 03/04/24 1529  BP: 119/77     Pulse: (!) 130   (!) 130  Resp: 19  19   Temp: 98.9 F (37.2 C) 99.5 F (37.5 C)    TempSrc: Oral Oral    SpO2: 92%  93% 94%  Weight:      Height:       General exam: Conversant, in no acute distress Respiratory system: normal chest rise, clear, no audible wheezing Cardiovascular system: regular rhythm, s1-s2 Gastrointestinal system: Nondistended, nontender, pos BS Central nervous system: No seizures, no tremors Extremities: No cyanosis, no joint deformities Skin: No rashes, no pallor Psychiatry: Affect normal // no auditory hallucinations   Data Reviewed:  Labs reviewed: Na 135, K 4.5, Cr 0.67, WBC 6.2, Hgb 8.2  Family Communication: Pt in room, family at bedside  Disposition: Status is: Inpatient Remains inpatient appropriate because: severity of illness  Planned Discharge Destination: Home with Home Health    Author: Garnette Pelt, MD 03/04/2024 3:43 PM  For on call review [email or web address].

## 2024-03-04 NOTE — Consult Note (Signed)
 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  This in the background of stage I colonic adenocarcinoma in June 2022.  She also has a history of right posterior tibial vein DVT for which she is on Eliquis  5 mg twice daily.  She saw Dr. Maude Crease March 06, 2024 and at that point had completed radiotherapy to the sacral metastasis.  She was also on Decadron .  She was recommended carboplatin, Alimta and Keytruda.  This has not been started as of yet and resume June 2025 ECOG was reported as 1.  She underwent right Port-A-Cath 02/22/24.  Then on 6/23/5 was admitted to the hospital with acute hypoxemic respiratory failure diagnosed as community-acquired pneumonia initially.  She had a high fever of 102 point 9 in the morning of admission associated nausea and headache and had new oxygen requirement of 3 L nasal cannula.  Chest x-ray with new upper lobe opacities.  Flu, COVID and SARS PCR negative.  She is on opioids suggest MS Contin  for her sacral metastasis.   Since admission course in the hospital Sick/23/25: Blood cultures negative  02/29/2024: Chest x-ray with upper lobe opacities.  Requiring 3 L nasal cannula.  Continued on Rocephin  and azithromycin .  03/01/24: Continued on 3 L nasal cannula and antibiotics.  Eliquis  continued.  She was seen by oncology consult Dr. Crease.  Right radicular pain noticed and Dr. Crease increased her gabapentin .  03/02/2024: Pulm embolism ruled out.  03/03/2024: Dr. Crease conserver temperature spike.  Concerning of desaturations continued fevers documented.  CAP  Antibiotics continued.  Respiratory virus panel multiplex negative  03/04/2024: CCM  consulted because of worsening hypoxemia now requiring 6 L.  Record review shows making fevers throughout the course of hospitalization.  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   reports that she quit smoking about 1 months ago. Her smoking use included cigarettes. She has a 12.5 pack-year smoking history. She has been exposed to tobacco smoke. She has never used smokeless tobacco.  Past Surgical History:  Procedure Laterality Date   ABDOMINAL HYSTERECTOMY  1999   Rolan March, MD   BIOPSY  01/01/2021   Procedure: BIOPSY;  Surgeon: Debby Hila, MD;  Location: [facility] ENDOSCOPY;  Service: Endoscopy;;   BRONCHIAL NEEDLE ASPIRATION BIOPSY  01/04/2024   Procedure: BRONCHOSCOPY, WITH NEEDLE ASPIRATION BIOPSY;  Surgeon: Shelah Lamar GORMAN, MD;  Location: [facility] ENDOSCOPY;  Service: Pulmonary;;   ENDOBRONCHIAL ULTRASOUND Right 01/04/2024   Procedure: ENDOBRONCHIAL ULTRASOUND (EBUS);  Surgeon:  Shelah Lamar RAMAN, MD;  Location: [facility] ENDOSCOPY;  Service: Pulmonary;  Laterality: Right;   FLEXIBLE SIGMOIDOSCOPY N/A 01/01/2021   Procedure: FLEXIBLE SIGMOIDOSCOPY WITH BIOPSY;  Surgeon: Debby Hila, MD;  Location: [facility] ENDOSCOPY;  Service:  Endoscopy;  Laterality: N/A;  IV SEDATION BY SURGEON   IR IMAGING GUIDED PORT INSERTION  02/22/2024   XI ROBOTIC ASSISTED LOWER ANTERIOR RESECTION N/A 02/21/2021   Procedure: XI ROBOTIC ASSISTED LOWER ANTERIOR RESECTION;  Surgeon: Debby Hila, MD;  Location: [facility] ORS;  Service: General;  Laterality: N/A;    No Known Allergies   There is no immunization history on file for this patient.  Family History  Problem Relation Age of Onset   Liver cancer Mother      Current Facility-Administered Medications:    acetaminophen  (TYLENOL ) tablet 650 mg, 650 mg, Oral, Q6H PRN, 650 mg at 03/04/24 0601 **OR** acetaminophen  (TYLENOL ) suppository 650 mg, 650 mg, Rectal, Q6H PRN, Zella, Mir M, MD   apixaban  (ELIQUIS ) tablet 5 mg, 5 mg, Oral, BID, Ikramullah, Mir M, MD, 5 mg at 03/04/24 9087   azithromycin  (ZITHROMAX ) 500 mg in sodium chloride  0.9 % 250 mL IVPB, 500 mg, Intravenous, Q24H, Zella, Mir M, MD, Last Rate: 250 mL/hr at 03/03/24 1818, 500 mg at 03/03/24 1818   bisacodyl  (DULCOLAX) suppository 10 mg, 10 mg, Rectal, Once, Sebastian Toribio GAILS, MD   calcium  citrate (CALCITRATE - dosed in mg elemental calcium ) tablet 950 mg, 200 mg of elemental calcium , Oral, Daily, Carolee Rosaline DASEN, RPH, 950 mg at 03/04/24 9087   cefTRIAXone  (ROCEPHIN ) 2 g in sodium chloride  0.9 % 100 mL IVPB, 2 g, Intravenous, Q24H, Sebastian Toribio GAILS, MD, Last Rate: 200 mL/hr at 03/03/24 1734, 2 g at 03/03/24 1734   Chlorhexidine  Gluconate Cloth 2 % PADS 6 each, 6 each, Topical, Daily, Sebastian Toribio GAILS, MD, 6 each at 03/03/24 0515   cholecalciferol  (VITAMIN D3) 25 MCG (1000 UNIT) tablet 2,000 Units, 2,000 Units, Oral, Q breakfast, Sebastian Toribio GAILS, MD, 2,000 Units at 03/04/24 9255   cyanocobalamin  (VITAMIN B12) tablet 1,000 mcg, 1,000 mcg, Oral, Daily, Sebastian Toribio GAILS, MD, 1,000 mcg at 03/04/24 9087   diphenhydrAMINE -zinc  acetate (BENADRYL ) 2-0.1 % cream, , Topical, Daily PRN, Cindy Garnette POUR, MD, Given at 03/03/24  2156   feeding supplement (BOOST / RESOURCE BREEZE) liquid 1 Container, 1 Container, Oral, TID BM, Ennever, Peter R, MD, 1 Container at 03/04/24 0914   fluticasone (FLONASE) 50 MCG/ACT nasal spray 2 spray, 2 spray, Each Nare, Daily, Timmy Maude SAUNDERS, MD, 2 spray at 03/04/24 9085   gabapentin  (NEURONTIN ) capsule 400 mg, 400 mg, Oral, TID, Ennever, Peter R, MD, 400 mg at 03/04/24 9087   ipratropium-albuterol  (DUONEB) 0.5-2.5 (3) MG/3ML nebulizer solution 3 mL, 3 mL, Nebulization, QID, Ennever, Maude SAUNDERS, MD, 3 mL at 03/04/24 1130   morphine  (MS CONTIN ) 12 hr tablet 15 mg, 15 mg, Oral, Q12H, Zella, Mir M, MD, 15 mg at 03/04/24 9087   ondansetron  (ZOFRAN ) tablet 4 mg, 4 mg, Oral, Q6H PRN **OR** ondansetron  (ZOFRAN ) injection 4 mg, 4 mg, Intravenous, Q6H PRN, Zella, Mir M, MD, 4 mg at 02/28/24 2038   Oral care mouth rinse, 15 mL, Mouth Rinse, PRN, Zella, Mir M, MD   oxyCODONE  (Oxy IR/ROXICODONE ) immediate release tablet 5-10 mg, 5-10 mg, Oral, Q4H PRN, Cindy Garnette POUR, MD, 5 mg at 03/04/24 0601   pantoprazole  (PROTONIX ) EC tablet 40 mg, 40 mg, Oral, QAC breakfast, Carolee Rosaline T, RPH, 40 mg at 03/04/24 0744   polyethylene  glycol (MIRALAX  / GLYCOLAX ) packet 17 g, 17 g, Oral, Daily PRN, Cindy Garnette POUR, MD   rosuvastatin  (CRESTOR ) tablet 5 mg, 5 mg, Oral, Daily, Sebastian Toribio GAILS, MD, 5 mg at 03/04/24 0744   senna-docusate (Senokot-S) tablet 1 tablet, 1 tablet, Oral, BID, Sebastian Toribio GAILS, MD, 1 tablet at 03/02/24 2159   sodium chloride  flush (NS) 0.9 % injection 10-40 mL, 10-40 mL, Intracatheter, Q12H, Sebastian Toribio GAILS, MD, 10 mL at 03/04/24 0913   sodium chloride  flush (NS) 0.9 % injection 10-40 mL, 10-40 mL, Intracatheter, PRN, Sebastian Toribio GAILS, MD   zolpidem  (AMBIEN ) tablet 5 mg, 5 mg, Oral, QHS PRN, Zella Katha HERO, MD     Significant Hospital Events:  02/28/2024 - admit   Interim History / Subjective:   03/04/2024 - seen in bed [address] [place]  Objective   Blood pressure  104/69, pulse (!) 119, temperature 97.9 F (36.6 C), temperature source Oral, resp. rate 16, height 5' 2 (1.575 m), weight 48.6 kg, SpO2 94%.        Intake/Output Summary (Last 24 hours) at 03/04/2024 1259 Last data filed at 03/04/2024 0800 Gross per 24 hour  Intake 1100 ml  Output --  Net 1100 ml   Filed Weights   02/28/24 2000  Weight: 48.6 kg    Examination: General: frail femal sitting in chair HENT: O2 on at 6L [place].  CHEST - PORTOCATH  RT side + Lungs: Crackles, Mild tachypneia but no distress Cardiovascular: tachy Abdomen: soft Extremities: intact Neuro: AxOx3. Speech n ormal. Moves al 4 GU: not examine  Resolved Hospital Problem list     Assessment & Plan:  ASSESSMENT / PLAN:  PULMONARY  A:  Acute hypoxemic respiratory failure with upper lobe interstitial infiltrates -new onset this admission and progressively getting worse.  No evidence of chemotherapy to account for toxicity.  Differential diagnose includes HCAP, lymphangitis carcinomatosis in highly unlikely but possible would be acute hypersensitive pneumonitis   P:   Start BiPAP nightly for approximate respiratory failure immunocompromised patients along with 2 applications each of 2 hours in the daytime Oxygen for pulse ox goal greater than 88 or 90% Check sed rate   INFECTIOUS A:   Diagnosis community-acquired pneumonia but not responding to standard antibiotic therapy.  Chest x-ray more with interstitial pattern with lymphangitis carcinomatosis in the differential and HCAP in the differential.   03/04/2024 -worsening hypoxemia and ongoing fevers  P:   Stop community-acquired pneumonia antibiotic therapy Start HCAP therapy Check ESR Check procalcitonin Check urine strep Check urine Legionella    Best practice (daily eval):  According to [facility] Goals of Care:   Full code but no CPR no intubation very appropriate    Family Updates: Updated patient at the bedside      SIGNATURE     Dr. Dorethia Cave, M.D., F.C.C.P,  Pulmonary and Critical Care Medicine Staff Physician, [facility] Director - Interstitial Lung Disease  Program  Pulmonary [facility] [place], [place], [address]  NPI Number:  NPI #[id number]  Pager: [phone], If no answer  -> Check AMION or Try [phone] Telephone (clinical office): [phone] Telephone (research): [phone]  12:59 PM 03/04/2024   03/04/2024 12:59 PM    LABS    PULMONARY No results for input(s): PHART, PCO2ART, PO2ART, HCO3, TCO2, O2SAT in the last 168 hours.  Invalid input(s): PCO2, PO2  CBC Recent Labs  Lab 03/02/24 0342 03/03/24 0339 03/04/24  0327  HGB 9.0* 8.8* 8.2*  HCT 28.8* 28.5* 26.9*  WBC 6.4 6.0 6.2  PLT 240 309 299    COAGULATION Recent Labs  Lab 02/28/24 1132  INR 1.4*    CARDIAC  No results for input(s): TROPONINI in the last 168 hours. No results for input(s): PROBNP in the last 168 hours.  CHEMISTRY Recent Labs  Lab 02/29/24 0523 03/01/24 0334 03/02/24 0342 03/03/24 0339 03/04/24 0327  NA 136 134* 137 135 135  K 4.1 3.4* 4.3 3.7 4.5  CL 103 100 104 102 102  CO2 22 25 24 24 24   GLUCOSE 75 134* 96 95 103*  BUN 15 13 12 17 19   CREATININE 0.70 0.75 0.71 0.71 0.67  CALCIUM  7.8* 7.5* 7.9* 7.6* 7.9*  MG  --  2.0  --   --   --    Estimated Creatinine Clearance: 53.8 mL/min (by C-G formula based on SCr of 0.67 mg/dL).   LIVER Recent Labs  Lab 02/28/24 1132 02/29/24 0523 03/02/24 0342 03/03/24 0339 03/04/24 0327  AST 34 30 37 61* 192*  ALT 59* 42 45* 73* 192*  ALKPHOS 66 62 83 107 135*  BILITOT 0.8 1.1 0.7 0.6 0.6  PROT 5.9* 5.7* 5.7* 5.8* 5.7*  ALBUMIN 2.7* 2.3* 2.2* 1.9* 1.9*  INR 1.4*  --   --   --   --      INFECTIOUS Recent Labs  Lab 02/28/24 1140  LATICACIDVEN 0.8     ENDOCRINE CBG (last 3)  No results for input(s): GLUCAP in the last 72  hours.       IMAGING x48h  - image(s) personally visualized  -   highlighted in bold CT CHEST WO CONTRAST Result Date: 03/04/2024 CLINICAL DATA:  Evaluate for pneumonia. History of rectal carcinoma. EXAM: CT CHEST WITHOUT CONTRAST TECHNIQUE: Multidetector CT imaging of the chest was performed following the standard protocol without IV contrast. RADIATION DOSE REDUCTION: This exam was performed according to the departmental dose-optimization program which includes automated exposure control, adjustment of the mA and/or kV according to patient size and/or use of iterative reconstruction technique. COMPARISON:  CT angio chest from 03/02/2024 FINDINGS: Cardiovascular: Heart size is within normal limits. Right chest wall port a catheter is noted with catheter tip at the superior cavoatrial junction. No pericardial effusion. Coronary artery atherosclerotic calcifications. Mediastinum/Nodes: Thyroid gland, trachea, and esophagus appear normal. No enlarged supraclavicular, axillary or mediastinal lymph nodes. Hilar lymph nodes suboptimally evaluated due to lack of IV contrast. Lungs/Pleura: The central airways are patent. Persistent and slightly increased pleural fluid overlying the posterior and lateral right lower lobe. Overlying progressive airspace consolidation is identified. The lungs appear similar appearance of diffuse increase interstitial thickening and ground-glass attenuation within upper lung zone predominance. As noted previously there is some mild peripheral consolidative change within the anterior right upper lobe. Within the basilar right upper lobe there is a 4 mm lung nodule, similar to previous exam, image 69/8. Upper Abdomen: Severe right hydronephrosis is again noted as seen on the CT from 03/02/2024. Unchanged. Similar appearance of low-attenuation splenic lesions. Musculoskeletal: No acute or suspicious osseous findings. IMPRESSION: 1. Persistent and slightly increased pleural fluid  overlying the posterior and lateral right lower lobe. Overlying progressive airspace consolidation is identified. Findings are concerning for progressive pneumonia. 2. Similar appearance of diffuse increase interstitial thickening and ground-glass attenuation within upper lung zone predominance. Findings are nonspecific and may reflect pulmonary edema or atypical infection. Hypersensitivity pneumonitis in the setting of ongoing chemotherapy is  also a differential consideration. 3. Stable appearance of 4 mm lung nodule within the basilar right upper lobe. Attention on follow-up imaging recommended. 4. Stable appearance of severe right hydronephrosis as seen on the CT from 03/02/2024. 5.  Aortic Atherosclerosis (ICD10-I70.0). Electronically Signed   By: Waddell Calk M.D.   On: 03/04/2024 10:37   CT Angio Chest Pulmonary Embolism (PE) W or WO Contrast Result Date: 03/02/2024 CLINICAL DATA:  High probability for PE.  History of rectal cancer. EXAM: CT ANGIOGRAPHY CHEST WITH CONTRAST TECHNIQUE: Multidetector CT imaging of the chest was performed using the standard protocol during bolus administration of intravenous contrast. Multiplanar CT image reconstructions and MIPs were obtained to evaluate the vascular anatomy. RADIATION DOSE REDUCTION: This exam was performed according to the departmental dose-optimization program which includes automated exposure control, adjustment of the mA and/or kV according to patient size and/or use of iterative reconstruction technique. CONTRAST:  75mL OMNIPAQUE  IOHEXOL  350 MG/ML SOLN COMPARISON:  CT chest 12/23/2023.  PET-CT 01/03/2024. FINDINGS: Cardiovascular: Satisfactory opacification of the pulmonary arteries to the segmental level. No evidence of pulmonary embolism. Normal heart size. No pericardial effusion. Right chest port catheter tip ends in the right atrium. Mediastinum/Nodes: Enlarged right hilar lymph node measuring 11 mm has increased in size. No other enlarged lymph  nodes are seen. Visualized esophagus and thyroid gland are within normal limits. Lungs/Pleura: There is new multifocal and confluent areas of smooth interlobular septal thickening with superimposed ground-glass opacities are seen predominantly throughout the bilateral upper lobes and minimally in the right middle lobe and superior segment of the right lower lobe. There is a trace right pleural effusion with atelectasis in the right lung base. Right suprahilar nodular density appears grossly unchanged measuring 1.9 x 1.7 cm on coronal image 7/38. Upper Abdomen: Severe right hydronephrosis is partially imaged and unchanged. Low-attenuation lesions in the spleen are stable. Musculoskeletal: No chest wall abnormality. No acute or significant osseous findings. Review of the MIP images confirms the above findings. IMPRESSION: 1. No evidence for pulmonary embolism. 2. New multifocal and confluent areas of smooth interlobular septal thickening with superimposed ground-glass opacities predominantly throughout the bilateral upper lobes. Findings are nonspecific and may be related to pulmonary edema, ARDS, infection, or other inflammatory processes. 3. Trace right pleural effusion. 4. Stable right suprahilar nodular density. 5. Enlarged right hilar lymph node has increased in size. 6. Stable severe right hydronephrosis. Electronically Signed   By: Greig Pique M.D.   On: 03/02/2024 18:29

## 2024-03-04 NOTE — Plan of Care (Signed)

## 2024-03-04 NOTE — Progress Notes (Signed)
 Occupational Therapy Treatment Patient Details Name: Regina Young MRN: [id number] DOB: [birth year] Today's Date: 03/04/2024   History of present illness 68 yo female presents to therapy following hospital admission on 02/28/2024 due to elevated tempeture, SOB and AMS. Pt found to have acute respiratory failure secondary to CAP. 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   OT comments  Treatment limited today due to pt being tachycardic in resting chair position. Focused on OT goals of teaching HEP with handouts provided as well as education and handouts on energy conservation with instruction on use of the 4 Ps, PLB and use of RPE. Pt verbalized and demonstrated understanding but limited tolerance for HEP today with resting HR from 127-131.   Pt continues to demonstrate good rehab potential and would benefit from continued skilled OT to increase safety and independence with ADLs and functional transfers to allow pt to return home safely and reduce caregiver burden and fall risk. Home health OT is recommended.        If plan is discharge home, recommend the following:  A little help with walking and/or transfers;A little help with bathing/dressing/bathroom;Assistance with cooking/housework;Direct supervision/assist for medications management;Direct supervision/assist for financial management;Assist for transportation;Help with stairs or ramp for entrance   Equipment Recommendations  None recommended by OT    Recommendations for Other Services      Precautions / Restrictions Precautions Precautions: Fall Recall of Precautions/Restrictions: Intact Precaution/Restrictions Comments: Mon HR and O2 Restrictions Weight Bearing Restrictions Per Provider Order: No       Mobility Bed Mobility               General bed mobility comments: Pt in chair    Transfers                    General transfer comment: Deferred today due to tachycardic and pt very easily fatigued.     Balance                                           ADL either performed or assessed with clinical judgement   ADL Overall ADL's : Needs assistance/impaired Eating/Feeding: Independent;Sitting Eating/Feeding Details (indicate cue type and reason): Ice chjips to address dry feeling mouth. RN notified as well.                                   General ADL Comments: Pt tachycardic today and very easily fatigued so time spent educating pt on energy conservation inclucing the 4 Ps with ADL examples made and use of RPE to judge when to rest and for how long. Pt performed PLB as well with instructions on progress length of in/exhales. Pt bumped SpO2 from 91-94% after PLB for ~1 min on HFNC.    Extremity/Trunk Assessment Upper Extremity Assessment Upper Extremity Assessment: Generalized weakness;Right hand dominant            Vision Baseline Vision/History: 1 Wears glasses Ability to See in Adequate Light: 0 Adequate Patient Visual Report: No change from baseline     Perception     Praxis     Communication Communication Communication: No apparent difficulties   Cognition Arousal: Alert Behavior During Therapy: [facility] for tasks assessed/performed  Cognition: No apparent impairments                               Following commands: Intact        Cueing   Cueing Techniques: Verbal cues  Exercises Other Exercises Other Exercises: Pt provided with simple BUE Theraband written HEP and lightest resistance theraband. Pt shown two exercises to perform at chair level. Pt able to perform 2 reps BUE tricep strengthening and then stopped due to increased fatigue. Pt encouraged to rest whenever needed and that 1-2 reps of one exercise a few times a day may be where she needs to start. Pt cautioned NOT to begin exercises if she already feels  fatigued.    Shoulder Instructions       General Comments      Pertinent Vitals/ Pain       Pain Assessment Pain Assessment: Faces Faces Pain Scale: Hurts little more Pain Location: mouth-dry and stings Pain Intervention(s): Monitored during session (Ice provided. RN notified)  Home Living                                          Prior Functioning/Environment              Frequency  Min 2X/week        Progress Toward Goals  OT Goals(current goals can now be found in the care plan section)  Progress towards OT goals: Progressing toward goals  Acute Rehab OT Goals Patient Stated Goal: To be able to tolerate more activity. OT Goal Formulation: With patient/family Time For Goal Achievement: 03/15/24 Potential to Achieve Goals: Good  Plan      Co-evaluation                 AM-PAC OT 6 Clicks Daily Activity     Outcome Measure   Help from another person eating meals?: None Help from another person taking care of personal grooming?: A Little Help from another person toileting, which includes using toliet, bedpan, or urinal?: A Little Help from another person bathing (including washing, rinsing, drying)?: A Little Help from another person to put on and taking off regular upper body clothing?: None Help from another person to put on and taking off regular lower body clothing?: A Little 6 Click Score: 20    End of Session Equipment Utilized During Treatment: Oxygen  OT Visit Diagnosis: Unsteadiness on feet (R26.81);Muscle weakness (generalized) (M62.81)   Activity Tolerance Patient limited by fatigue   Patient Left in chair;with call bell/phone within reach;with chair alarm set;with family/visitor present   Nurse Communication Other (comment) (dry mouth)        Time: 1425-1500 OT Time Calculation (min): 35 min  Charges: OT General Charges $OT Visit: 1 Visit OT Treatments $Self Care/Home Management : 8-22 mins $Therapeutic  Exercise: 8-22 mins  Delon, OT Acute Rehab Services Office: [phone] 03/04/2024   Delon Falter 03/04/2024, 3:37 PM

## 2024-03-05 DIAGNOSIS — J189 Pneumonia, unspecified organism: Secondary | ICD-10-CM | POA: Diagnosis not present

## 2024-03-05 DIAGNOSIS — J9601 Acute respiratory failure with hypoxia: Secondary | ICD-10-CM | POA: Diagnosis not present

## 2024-03-05 DIAGNOSIS — R918 Other nonspecific abnormal finding of lung field: Secondary | ICD-10-CM | POA: Diagnosis not present

## 2024-03-05 DIAGNOSIS — R509 Fever, unspecified: Secondary | ICD-10-CM | POA: Diagnosis not present

## 2024-03-05 LAB — COMPREHENSIVE METABOLIC PANEL WITH GFR
ALT: 283 U/L — ABNORMAL HIGH (ref 0–44)
AST: 239 U/L — ABNORMAL HIGH (ref 15–41)
Albumin: 1.9 g/dL — ABNORMAL LOW (ref 3.5–5.0)
Alkaline Phosphatase: 157 U/L — ABNORMAL HIGH (ref 38–126)
Anion gap: 10 (ref 5–15)
BUN: 19 mg/dL (ref 8–23)
CO2: 24 mmol/L (ref 22–32)
Calcium: 8 mg/dL — ABNORMAL LOW (ref 8.9–10.3)
Chloride: 104 mmol/L (ref 98–111)
Creatinine, Ser: 0.48 mg/dL (ref 0.44–1.00)
GFR, Estimated: >60 mL/min (ref >60)
Glucose, Bld: 102 mg/dL — ABNORMAL HIGH (ref 70–99)
Potassium: 4 mmol/L (ref 3.5–5.1)
Sodium: 138 mmol/L (ref 135–145)
Total Bilirubin: 0.5 mg/dL (ref 0.0–1.2)
Total Protein: 5.8 g/dL — ABNORMAL LOW (ref 6.5–8.1)

## 2024-03-05 LAB — CBC
HCT: 26.6 % — ABNORMAL LOW (ref 36.0–46.0)
Hemoglobin: 8.2 g/dL — ABNORMAL LOW (ref 12.0–15.0)
MCH: 28.7 pg (ref 26.0–34.0)
MCHC: 30.8 g/dL (ref 30.0–36.0)
MCV: 93 fL (ref 80.0–100.0)
Platelets: 293 10*3/uL (ref 150–400)
RBC: 2.86 MIL/uL — ABNORMAL LOW (ref 3.87–5.11)
RDW: 15.8 % — ABNORMAL HIGH (ref 11.5–15.5)
WBC: 5.7 10*3/uL (ref 4.0–10.5)
nRBC: 0.4 % — ABNORMAL HIGH (ref 0.0–0.2)

## 2024-03-05 LAB — LACTIC ACID, PLASMA: Lactic Acid, Venous: 0.6 mmol/L (ref 0.5–1.9)

## 2024-03-05 LAB — PROCALCITONIN: Procalcitonin: 0.94 ng/mL

## 2024-03-05 LAB — STREP PNEUMONIAE URINARY ANTIGEN: Strep Pneumo Urinary Antigen: NEGATIVE

## 2024-03-05 LAB — SEDIMENTATION RATE: Sed Rate: 143 mm/h — ABNORMAL HIGH (ref 0–22)

## 2024-03-05 MED ORDER — METOPROLOL TARTRATE 25 MG PO TABS
25.0000 mg | ORAL_TABLET | Freq: Two times a day (BID) | ORAL | Status: DC
  Administered 2024-03-05 – 2024-03-10 (×11): 25 mg via ORAL
  Filled 2024-03-05 (×11): qty 1

## 2024-03-05 MED ORDER — HYDROCORTISONE SOD SUC (PF) 100 MG IJ SOLR
100.0000 mg | Freq: Two times a day (BID) | INTRAMUSCULAR | Status: DC
  Administered 2024-03-05 – 2024-03-08 (×6): 100 mg via INTRAVENOUS
  Filled 2024-03-05 (×6): qty 2

## 2024-03-05 NOTE — Progress Notes (Addendum)
   03/05/24 1120  BiPAP/CPAP/SIPAP  BiPAP/CPAP/SIPAP Pt Type Adult  BiPAP/CPAP/SIPAP V60  Mask Type Full face mask  Dentures removed? Not applicable  Mask Size Small  Set Rate 12 breaths/min  Respiratory Rate 28 breaths/min  IPAP (S)  8 cmH20 (Weaned to 8 IPAP, MD aware and in agreement.)  EPAP (S)  5 cmH2O  FiO2 (%) 40 %  Flow Rate 1 lpm (Rise 3)  Minute Ventilation 13.2  Leak 7  Peak Inspiratory Pressure (PIP) 9  Tidal Volume (Vt) 478  Patient Home Machine No  Patient Home Mask No  Patient Home Tubing No  Auto Titrate No  Press High Alarm 35 cmH2O  Press Low Alarm 5 cmH2O  Nasal massage performed No (comment)  Device Plugged into RED Power Outlet Yes  BiPAP/CPAP /SiPAP Vitals  Pulse Rate (!) 117  Resp 19  SpO2 98 %  Bilateral Breath Sounds Diminished;Fine crackles  MEWS Score/Color  MEWS Score 2  MEWS Score Color Yellow   GOAL: 2 hrs during the day, mandatory @ HS: V/0: Ramaswamy, CCM.

## 2024-03-05 NOTE — Progress Notes (Signed)
   03/05/24 0951  BiPAP/CPAP/SIPAP  Reason BIPAP/CPAP not in use  (Pt is currently drinking a protein shake and she just finished a bowl of cereal, will try to put the bipap on @1030  for the 2 hrs. timeframe. 11 L HFNC, 93%.)

## 2024-03-05 NOTE — Consult Note (Addendum)
 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  This in the background of stage I colonic adenocarcinoma in June 2022.  She also has a history of right posterior tibial vein DVT for which she is on Eliquis  5 mg twice daily.  She saw Dr. Maude Crease March 06, 2024 and at that point had completed radiotherapy to the sacral metastasis.  She was also on Decadron .  She was recommended carboplatin, Alimta and Keytruda.  This has not been started as of yet and resume June 2025 ECOG was reported as 1.  She underwent right Port-A-Cath 02/22/24.  Then on 6/23/5 was admitted to the hospital with acute hypoxemic respiratory failure diagnosed as community-acquired pneumonia initially.  She had a high fever of 102 point 9 in the morning of admission associated nausea and headache and had new oxygen requirement of 3 L nasal cannula.  Chest x-ray with new upper lobe opacities.  Flu, COVID and SARS PCR negative.  She is on opioids suggest MS Contin  for her sacral metastasis. 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   reports that she quit smoking about 2 months ago. Her smoking use included cigarettes. She has a 12.5 pack-year smoking history. She has been exposed to tobacco smoke. She has never used smokeless tobacco.     Significant Hospital Events:  02/18/2024: Office visit with Dr. Crease plan was to taper of Decadron .  Plan was to get Port-A-Cath and start patient on carboplatin/Alimta/Keytruda.  He noticed that ECOG is 1 Eliquis  was being continued.  He noticed that she had completed radiotherapy to the sacral metastasis  xxxxx 02/28/2024 - admit: : Blood cultures negative  02/29/2024: Chest x-ray with upper lobe opacities.  Requiring 3 L nasal cannula.  Continued on Rocephin  and azithromycin .  03/01/24: Continued on 3 L nasal cannula and antibiotics.  Eliquis  continued.  She was seen by oncology consult Dr. Crease.  Right radicular pain noticed and Dr. Crease increased her gabapentin .  - Carbo/Alimta/Keytruda -- tart cycle #1 on 03/01/2024  - GOING TO START but did ot happen due to ilness  03/02/2024: Pulm embolism ruled out.  03/03/2024: Dr. Crease conserver temperature spike.  Concerning of desaturations continued fevers documented.  CAP  Antibiotics continued.  Respiratory virus panel multiplex negative  03/04/2024: CCM consulted because of worsening hypoxemia now requiring 6 L.  Record review shows making fevers throughout the course of hospitalization.  - MRSA PCR negative   - Urine strep -3  Interim History / Subjective:   03/05/2024 -according to respiratory therapy patient did really well with BiPAP.  She used BiPAP for 2 hours in the daytime yesterday and throughout the night.  Respiratory therapy is wondering about weaning the BiPAP and limiting it to just nighttime.  She still is having fevers but  the overall curve seems to be improving.  Current temperature 99 Fahrenheit.  -Sedimentation rate 143 and significantly elevated  =-Procalcitonin 0.9 and consistent with localized infection  - Currently on 11 L oxygen.  She an dhusband report getting XRT to spine and lower chest Denies mold or down exposure in the house thoug house is old    Objective   Blood pressure 137/70, pulse (!) 114, temperature 99 F (37.2 C), resp. rate 17, height 5' 2 (1.575 m), weight 48.6 kg, SpO2 93%.    FiO2 (%):  [40 %] 40 %   Intake/Output Summary (Last 24 hours) at 03/05/2024 1102 Last data filed at 03/05/2024 9095 Gross per 24 hour  Intake 744.36 ml  Output --  Net 744.36 ml   Filed Weights   02/28/24 2000  Weight: 48.6 kg   mine  General Appearance:  Looks cachectic but bette Head:  Normocephalic, without obvious abnormality, atraumatic Eyes:  PERRL - yes, conjunctiva/corneas - mdd     Ears:  Normal external ear canals, both ears Nose:  G tube - no Throat:  ETT TUBE - no , OG tube - no. BIPAP + Neck:  Supple,  No enlargement/tenderness/nodules Lungs: Clear to auscultation bilaterally,  LESS TACHYEPNIC. LESS UL CRACK:Es Heart:  S1 and S2 normal, no murmur, CVP - no.  Pressors - no Abdomen:  Soft, no masses, no organomegaly Genitalia / Rectal:  Not done Extremities:  Extremities- no Skin:  ntact in exposed areas . Sacral area - no Neurologic:  Sedation - noine -> RASS - +1 . Moves all 4s - yes. CAM-ICU - neg . Orientation - x3+      Resolved Hospital Problem list     Assessment & Plan:   Acute hypoxemic respiratory failure with upper lobe interstitial infiltrates -new onset this admission and progressively getting worse.  No evidence of chemotherapy to account for toxicity.  Differential diagnose includes HCAP, lymphangitis carcinomatosis in highly unlikely but possible would be acute hypersensitive pneumonitis   03/05/2024: Tolerating BiPAP well and subjectively better.  ESR  extremely high.  Procalcitonin slightly high.  Features are consistent with viral pneumonitis or radiation pneumonitis although features are not fully typical for this.  Hypersensitive pneumonitis is also a possible etiology.  Boop is another possibility but again features are not consistent.  Fevers can be c/w BOOP/COP   BiPAP helping P:   Continue BiPAP nightly for approximate respiratory failure immunocompromised patients along  + 4h in day time (in 1 or 2 applications)  START hydrocortisone 200 mg daily (based on severe Literature and NEJM] IV -> if responding then will need a 7-14-day course  - At some point if the diagnosis is gout we might have to consider longer Prednisone course Oxygen for pulse ox goal greater than 88 or 90%   INFECTIOUS A:   Diagnosis community-acquired pneumonia but not responding to standard antibiotic therapy.  Chest x-ray more with interstitial pattern with lymphangitis carcinomatosis in the differential and HCAP in the differential.   03/05/2024 -similar amount of hypoxemia.  Fever still ongoing procalcitonin 0.9.  FEvers can be feature of inflammator response eg BOOP/COP   P:   STOP VANCOMYCIN  Continue  CEfepime  Mnitor response to Hydrocort Start HCAP therapy     Best practice (daily eval):  According to [facility] Goals of Care:   Full code but no CPR no intubation very appropriate    Family Updates: Updated patient at the bedside and sister and husband 03/05/24  D/w Dr Cindy ENGLAND    Dr. Dorethia Cave, M.D., F.C.C.P,  Pulmonary and Critical Care Medicine Staff Physician, [facility] Director - Interstitial Lung Disease  Program  Pulmonary Fibrosis [facility] at [facility] [place], [place], [address]  NPI Number:  NPI #[id number]  Pager: [phone], If no answer  -> Check AMION or Try [phone] Telephone (clinical office): [phone] Telephone (research): [phone]  11:02 AM 03/05/2024   03/05/2024 11:02 AM    LABS    PULMONARY No results for input(s): PHART, PCO2ART, PO2ART, HCO3, TCO2, O2SAT in the last 168 hours.  Invalid input(s): PCO2, PO2  CBC Recent Labs  Lab 03/03/24 0339 03/04/24 0327 03/05/24 0347  HGB 8.8* 8.2* 8.2*  HCT 28.5* 26.9* 26.6*  WBC 6.0 6.2 5.7  PLT 309 299 293    COAGULATION Recent Labs  Lab 02/28/24 1132  INR 1.4*    CARDIAC  No results for input(s): TROPONINI in the last 168 hours. No results for input(s): PROBNP in the last 168 hours.  CHEMISTRY Recent Labs  Lab 03/01/24 0334 03/02/24 0342 03/03/24 0339 03/04/24 0327 03/05/24 0347  NA 134* 137 135 135 138  K 3.4* 4.3 3.7 4.5 4.0  CL 100 104 102 102 104  CO2 25 24 24 24 24   GLUCOSE 134* 96 95 103* 102*  BUN 13 12 17 19 19   CREATININE 0.75 0.71 0.71 0.67 0.48  CALCIUM  7.5* 7.9* 7.6* 7.9* 8.0*  MG 2.0  --   --   --   --    Estimated Creatinine Clearance: 53.8 mL/min (by C-G formula based on SCr of 0.48 mg/dL).   LIVER Recent Labs  Lab 02/28/24 1132 02/29/24 0523 03/02/24 0342 03/03/24 0339 03/04/24 0327 03/05/24 0347  AST 34 30 37 61* 192* 239*  ALT 59* 42 45* 73* 192* 283*  ALKPHOS 66 62 83 107 135* 157*  BILITOT 0.8 1.1 0.7 0.6 0.6 0.5  PROT 5.9* 5.7* 5.7* 5.8* 5.7* 5.8*  ALBUMIN 2.7* 2.3* 2.2* 1.9* 1.9* 1.9*  INR 1.4*  --   --   --   --   --      INFECTIOUS Recent Labs  Lab 02/28/24 1140 03/05/24 0347  LATICACIDVEN 0.8 0.6  PROCALCITON  --  0.94     ENDOCRINE CBG (last 3)  No results for input(s): GLUCAP in the last 72 hours.       IMAGING x48h  - image(s) personally visualized  -   highlighted in bold CT CHEST WO CONTRAST Result Date: 03/04/2024 CLINICAL DATA:  Evaluate for pneumonia. History of rectal carcinoma. EXAM: CT CHEST WITHOUT CONTRAST TECHNIQUE: Multidetector CT imaging of the chest was performed following the standard protocol without IV contrast. RADIATION DOSE  REDUCTION: This exam was performed according to the departmental dose-optimization program which includes automated exposure control, adjustment of the mA and/or kV according to patient size and/or use of iterative reconstruction technique. COMPARISON:  CT angio chest from 03/02/2024 FINDINGS: Cardiovascular: Heart size is within normal limits. Right chest wall port a catheter is noted with catheter tip at the superior cavoatrial junction. No pericardial effusion. Coronary  artery atherosclerotic calcifications. Mediastinum/Nodes: Thyroid gland, trachea, and esophagus appear normal. No enlarged supraclavicular, axillary or mediastinal lymph nodes. Hilar lymph nodes suboptimally evaluated due to lack of IV contrast. Lungs/Pleura: The central airways are patent. Persistent and slightly increased pleural fluid overlying the posterior and lateral right lower lobe. Overlying progressive airspace consolidation is identified. The lungs appear similar appearance of diffuse increase interstitial thickening and ground-glass attenuation within upper lung zone predominance. As noted previously there is some mild peripheral consolidative change within the anterior right upper lobe. Within the basilar right upper lobe there is a 4 mm lung nodule, similar to previous exam, image 69/8. Upper Abdomen: Severe right hydronephrosis is again noted as seen on the CT from 03/02/2024. Unchanged. Similar appearance of low-attenuation splenic lesions. Musculoskeletal: No acute or suspicious osseous findings. IMPRESSION: 1. Persistent and slightly increased pleural fluid overlying the posterior and lateral right lower lobe. Overlying progressive airspace consolidation is identified. Findings are concerning for progressive pneumonia. 2. Similar appearance of diffuse increase interstitial thickening and ground-glass attenuation within upper lung zone predominance. Findings are nonspecific and may reflect pulmonary edema or atypical infection.  Hypersensitivity pneumonitis in the setting of ongoing chemotherapy is also a differential consideration. 3. Stable appearance of 4 mm lung nodule within the basilar right upper lobe. Attention on follow-up imaging recommended. 4. Stable appearance of severe right hydronephrosis as seen on the CT from 03/02/2024. 5.  Aortic Atherosclerosis (ICD10-I70.0). Electronically Signed   By: Waddell Calk M.D.   On: 03/04/2024 10:37

## 2024-03-05 NOTE — Progress Notes (Signed)
   03/05/24 2237  BiPAP/CPAP/SIPAP  BiPAP/CPAP/SIPAP Pt Type Adult  BiPAP/CPAP/SIPAP V60  Mask Type Full face mask  Dentures removed? Not applicable  Mask Size Small  Set Rate 12 breaths/min  Respiratory Rate 18 breaths/min  IPAP 8 cmH20  EPAP 5 cmH2O  FiO2 (%) 40 %  Minute Ventilation 11.5  Leak 27  Peak Inspiratory Pressure (PIP) 8  Tidal Volume (Vt) 715  Patient Home Machine No  Patient Home Mask No  Patient Home Tubing No  Auto Titrate No  Press High Alarm 35 cmH2O  Press Low Alarm 5 cmH2O  Device Plugged into RED Power Outlet Yes  BiPAP/CPAP /SiPAP Vitals  Pulse Rate 91  Resp 18  SpO2 97 %  MEWS Score/Color  MEWS Score 0  MEWS Score Color Regina Young

## 2024-03-05 NOTE — Progress Notes (Signed)
   03/05/24 1320  BiPAP/CPAP/SIPAP  Reason BIPAP/CPAP not in use (S)   (Placed bipap on standby, applied 11 L [place], 93% Sp02, goal to place back on bipap @ 1520 to 1720.)

## 2024-03-05 NOTE — Progress Notes (Signed)
   03/05/24 1406  Assess: MEWS Score  Temp 99.8 F (37.7 C)  BP 116/69  MAP (mmHg) 83  Pulse Rate (!) 126  Resp 14  SpO2 92 %  O2 Device HFNC  Assess: MEWS Score  MEWS Temp 0  MEWS Systolic 0  MEWS Pulse 2  MEWS RR 0  MEWS LOC 2  MEWS Score 4  MEWS Score Color Red  Assess: if the MEWS score is Yellow or Red  Were vital signs accurate and taken at a resting state? Yes  Does the patient meet 2 or more of the SIRS criteria? Yes  Does the patient have a confirmed or suspected source of infection? Yes  MEWS guidelines implemented  Yes, red  Treat  MEWS Interventions Considered administering scheduled or prn medications/treatments as ordered  Take Vital Signs  Increase Vital Sign Frequency  Red: Q1hr x2, continue Q4hrs until patient remains green for 12hrs  Escalate  MEWS: Escalate Red: Discuss with charge nurse and notify provider. Consider notifying RRT. If remains red for 2 hours consider need for higher level of care  Notify: Charge Nurse/RN  Name of Charge Nurse/RN Notified Abby, RN  Provider Notification  Provider Name/Title Dr. Cindy  Date Provider Notified 03/05/24  Time Provider Notified 1400  Method of Notification Face-to-face  Notification Reason Other (Comment) (Increase in HR)  Provider response Evaluate remotely  Date of Provider Response 03/05/24  Time of Provider Response 1400  Assess: SIRS CRITERIA  SIRS Temperature  0  SIRS Respirations  0  SIRS Pulse 1  SIRS WBC 0  SIRS Score Sum  1

## 2024-03-05 NOTE — Progress Notes (Signed)
  Progress Note   Patient: Regina Young FMW:[id number] DOB: Sep 06, 1956 DOA: 02/28/2024     6 DOS: the patient was seen and examined on 03/05/2024   Brief hospital course: 68 year old female history of metastatic lung cancer, DVT on chronic anticoagulation with Eliquis , GERD, hyperlipidemia presented to the hospital acute hypoxic respiratory failure secondary to community-acquired pneumonia. Patient placed on empiric IV antibiotics. Patient also seen in consultation by hematology/oncology.   Assessment and Plan: 1 acute hypoxic respiratory failure likely secondary to community-acquired pneumonia - Patient noted to have presented with fevers as high as 102.9, nausea, headache, right lower extremity pain. - Patient noted to be compliant with anticoagulation with Eliquis . - Chest x-ray done with bilateral upper lobe opacities concerning for infiltrate versus edema. - Blood cultures with no growth to date. - SARS coronavirus 2 PCR negative - Influenza A and B by PCR negative - RSV by PCR negative. - Resp viral panel neg - Place on PPI, Mucinex, Claritin, Flonase. - No improvement with IV azithromycin  and IV Rocephin . Later changed to vanc/cefepime , and now cefepime  alone - Appreciate Pulmonary input. Recs for continued bipap   2.  Dehydration -IV fluids.   3.  Community-acquired pneumonia -Noted on chest x-ray bilateral upper lobe opacities. - Placed on PPI, Mucinex, Claritin, Flonase. -repeat CXR reviewed, unchanged -CTA chest with findings as per above -Was on azithro/rocephin . Now on cefepime  per pulm   4.  Hyponatremia -Felt likely secondary to a hypovolemic hyponatremia secondary to dehydration in the setting of an acute infection. - Improved with hydration.   5.  History of DVT -continue Eliquis .   6.  History of lung cancer with metastases -Continue home dose of MS Contin , oxycodone  as needed for breakthrough pain. - Patient being followed by Dr. Timmy per oncology.   7.   Constipation resolved -MiraLAX  prn   8.  Transaminitis - Likely secondary to acute infection. - trending up -Recent ct abd reviewed. Biliary system appeared unremarkable on that study      Subjective: Reports feeling better today  Physical Exam: Vitals:   03/05/24 1406 03/05/24 1512 03/05/24 1524 03/05/24 1528  BP: 116/69 112/75    Pulse: (!) 126     Resp: 14   17  Temp: 99.8 F (37.7 C) 97.6 F (36.4 C)    TempSrc: Oral     SpO2: 92% 92% 93%   Weight:      Height:       General exam: Awake, laying in bed, in nad Respiratory system: Normal respiratory effort, no wheezing Cardiovascular system: regular rate, s1, s2 Gastrointestinal system: Soft, nondistended, positive BS Central nervous system: CN2-12 grossly intact, strength intact Extremities: Perfused, no clubbing Skin: Normal skin turgor, no notable skin lesions seen Psychiatry: Mood normal // no visual hallucinations   Data Reviewed:  Labs reviewed: Na 138, K 4.0, Cr 0.48, Alk phos 157, AST 239, ALT 283, TB 0.5, WBC 5.7, Hgb 8.2  Family Communication: Pt in room, family at bedside  Disposition: Status is: Inpatient Remains inpatient appropriate because: severity of illness  Planned Discharge Destination: Home with Home Health    Author: Garnette Pelt, MD 03/05/2024 3:50 PM  For on call review [email or web address].

## 2024-03-05 NOTE — Progress Notes (Signed)
   03/05/24 1528  BiPAP/CPAP/SIPAP  BiPAP/CPAP/SIPAP Pt Type Adult  BiPAP/CPAP/SIPAP (S)  V60 (Goal= 2 hrs.)  Mask Type Full face mask  Dentures removed? Not applicable  Mask Size Small  Set Rate 12 breaths/min  Respiratory Rate 18 breaths/min  IPAP 8 cmH20  EPAP 5 cmH2O  FiO2 (%) 40 %  Flow Rate 1 lpm (Rise 3)  Minute Ventilation 8.1  Leak 10  Peak Inspiratory Pressure (PIP) 8  Tidal Volume (Vt) 633  Patient Home Machine No  Patient Home Mask No  Patient Home Tubing No  Auto Titrate No  Press High Alarm 35 cmH2O  Press Low Alarm 5 cmH2O  Nasal massage performed No (comment)  Device Plugged into RED Power Outlet Yes  BiPAP/CPAP /SiPAP Vitals  Resp 17  MEWS Score/Color  MEWS Score 4  MEWS Score Color Red     03/05/24 1528  BiPAP/CPAP/SIPAP  BiPAP/CPAP/SIPAP Pt Type Adult  BiPAP/CPAP/SIPAP (S)  V60 (Goal= 2 hrs.)  Mask Type Full face mask  Dentures removed? Not applicable  Mask Size Small  Set Rate 12 breaths/min  Respiratory Rate 18 breaths/min  IPAP 8 cmH20  EPAP 5 cmH2O  FiO2 (%) 40 %  Flow Rate 1 lpm (Rise 3)  Minute Ventilation 8.1  Leak 10  Peak Inspiratory Pressure (PIP) 8  Tidal Volume (Vt) 633  Patient Home Machine No  Patient Home Mask No  Patient Home Tubing No  Auto Titrate No  Press High Alarm 35 cmH2O  Press Low Alarm 5 cmH2O  Nasal massage performed No (comment)  Device Plugged into RED Power Outlet Yes  BiPAP/CPAP /SiPAP Vitals  Resp 17  MEWS Score/Color  MEWS Score 4  MEWS Score Color Red

## 2024-03-06 ENCOUNTER — Telehealth: Payer: Self-pay | Admitting: Radiation Oncology

## 2024-03-06 ENCOUNTER — Inpatient Hospital Stay (HOSPITAL_COMMUNITY)

## 2024-03-06 ENCOUNTER — Ambulatory Visit: Admission: RE | Admit: 2024-03-06 | Source: Ambulatory Visit | Admitting: Radiation Oncology

## 2024-03-06 DIAGNOSIS — R509 Fever, unspecified: Secondary | ICD-10-CM | POA: Diagnosis not present

## 2024-03-06 DIAGNOSIS — J189 Pneumonia, unspecified organism: Secondary | ICD-10-CM | POA: Diagnosis not present

## 2024-03-06 DIAGNOSIS — R918 Other nonspecific abnormal finding of lung field: Secondary | ICD-10-CM | POA: Diagnosis not present

## 2024-03-06 DIAGNOSIS — J9601 Acute respiratory failure with hypoxia: Secondary | ICD-10-CM | POA: Diagnosis not present

## 2024-03-06 LAB — COMPREHENSIVE METABOLIC PANEL WITH GFR
ALT: 364 U/L — ABNORMAL HIGH (ref 0–44)
AST: 297 U/L — ABNORMAL HIGH (ref 15–41)
Albumin: 1.9 g/dL — ABNORMAL LOW (ref 3.5–5.0)
Alkaline Phosphatase: 206 U/L — ABNORMAL HIGH (ref 38–126)
Anion gap: 11 (ref 5–15)
BUN: 27 mg/dL — ABNORMAL HIGH (ref 8–23)
CO2: 23 mmol/L (ref 22–32)
Calcium: 8.4 mg/dL — ABNORMAL LOW (ref 8.9–10.3)
Chloride: 106 mmol/L (ref 98–111)
Creatinine, Ser: 0.54 mg/dL (ref 0.44–1.00)
GFR, Estimated: >60 mL/min (ref >60)
Glucose, Bld: 133 mg/dL — ABNORMAL HIGH (ref 70–99)
Potassium: 3.8 mmol/L (ref 3.5–5.1)
Sodium: 140 mmol/L (ref 135–145)
Total Bilirubin: 0.4 mg/dL (ref 0.0–1.2)
Total Protein: 5.9 g/dL — ABNORMAL LOW (ref 6.5–8.1)

## 2024-03-06 LAB — IRON AND TIBC
Iron: 37 ug/dL (ref 28–170)
Saturation Ratios: 25 % (ref 10.4–31.8)
TIBC: 148 ug/dL — ABNORMAL LOW (ref 250–450)
UIBC: 111 ug/dL

## 2024-03-06 LAB — CBC
HCT: 27.3 % — ABNORMAL LOW (ref 36.0–46.0)
Hemoglobin: 8.3 g/dL — ABNORMAL LOW (ref 12.0–15.0)
MCH: 28.9 pg (ref 26.0–34.0)
MCHC: 30.4 g/dL (ref 30.0–36.0)
MCV: 95.1 fL (ref 80.0–100.0)
Platelets: 348 10*3/uL (ref 150–400)
RBC: 2.87 MIL/uL — ABNORMAL LOW (ref 3.87–5.11)
RDW: 15.6 % — ABNORMAL HIGH (ref 11.5–15.5)
WBC: 5.5 10*3/uL (ref 4.0–10.5)
nRBC: 0 % (ref 0.0–0.2)

## 2024-03-06 LAB — LEGIONELLA PNEUMOPHILA SEROGP 1 UR AG: L. pneumophila Serogp 1 Ur Ag: NEGATIVE

## 2024-03-06 LAB — SEDIMENTATION RATE: Sed Rate: 147 mm/h — ABNORMAL HIGH (ref 0–22)

## 2024-03-06 LAB — PROCALCITONIN: Procalcitonin: 0.62 ng/mL

## 2024-03-06 LAB — ABO/RH: ABO/RH(D): A POS

## 2024-03-06 MED ORDER — IPRATROPIUM-ALBUTEROL 0.5-2.5 (3) MG/3ML IN SOLN
3.0000 mL | Freq: Three times a day (TID) | RESPIRATORY_TRACT | Status: DC
  Administered 2024-03-06 – 2024-03-10 (×12): 3 mL via RESPIRATORY_TRACT
  Filled 2024-03-06 (×13): qty 3

## 2024-03-06 NOTE — Telephone Encounter (Signed)
 Pt called to advise she is currently hospitalized at this time and unable to make her f/u appt. Pt comfortable moving appt to 7/7 @3pm . She will call closer to this appt is she needs to r/s again.

## 2024-03-06 NOTE — Progress Notes (Signed)
 She looks better.  She feels a little bit better.  She is still on supplemental oxygen.SABRA  Apparently, she has some machine that she uses.  I think pulmonary prescribe this.  I do not know if this is a BiPAP machine.  She is going to the bathroom.  She is eating okay.  She is having no problems with pain.  She still has a radicular issues with the right foot secondary to her sacral tumor.  She has had no fever.  There has been no bleeding.  She has had no nausea or vomiting..  She says that she does not really feel short of breath.  Her labs show BUN 27 creatinine 0.54.  Calcium  8.4 with an albumin of 1.9.  Her LFTs are quite elevated.  The SGPT is 364 and SGOT 297.  Again I am not sure why the LFTs are so elevated.  I believe this is secondary to medications.  She had a CT scan that was done on Saturday.  Seem to show that she has some progressive pneumonia.  Her CBC shows white cell count of 5.5.  Hemoglobin 8.3.  Platelet count 348,000.  There is no bleeding.  Her vital signs are temperature 98.2.  Pulse 87.  Blood pressure 126/86.  Her lungs sound pretty clear bilaterally.  Cardiac exam regular rate and rhythm.  She has no murmurs.  Abdomen is soft.  Bowel sounds are present.  She has no fluid wave.  There is no palpable liver or spleen tip.  Extremities shows no clubbing, cyanosis or edema.   We still have a lot of issues here.  Again, the LFTs are quite elevated.  I am not sure as to why they are outside of medications.  I would think maybe antibiotics could be doing this.  She still has pneumonia.  She is quite anemic.  I will check her iron studies.  I will check her erythropoietin level.  I suspect she may need to be transfused at some point.  Her calcium  might be trending upward.  Her corrected calcium  is 10.5.  We really need to start treating his lung cancer.  However, we really cannot because of these other issues..  I really do appreciate everybody's help on 4 E.  Everybody is  doing a tremendous job.   Jeralyn Crease, MD  2 Timothy 1:7

## 2024-03-06 NOTE — Telephone Encounter (Signed)
 Thank you :)

## 2024-03-06 NOTE — Progress Notes (Signed)
 Physical Therapy Treatment Patient Details Name: Regina Young MRN: [id number] DOB: 09/17/1955 Today's Date: 03/06/2024   History of Present Illness 68 yo female presents to therapy following hospital admission on 02/28/2024 due to elevated tempeture, SOB and AMS. Pt found to have acute respiratory failure secondary to CAP. 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    PT Comments  Pt making good progress today.  Nursing reports down to 6 L O2 from 11 L yesterday.  She was able to ambulate 200' with RW and CGA with VSS on 6 L O2.  Did need rest breaks between ambulation and exercises.  Tolerated all well with no increase in pain.  Continue POC with recommendation for HHPT.     If plan is discharge home, recommend the following: A little help with walking and/or transfers;A little help with bathing/dressing/bathroom;Assistance with cooking/housework;Help with stairs or ramp for entrance;Assist for transportation   Can travel by private vehicle        Equipment Recommendations  None recommended by PT    Recommendations for Other Services       Precautions / Restrictions Precautions Precautions: Fall     Mobility  Bed Mobility Overal bed mobility: Needs Assistance Bed Mobility: Supine to Sit     Supine to sit: Supervision          Transfers Overall transfer level: Needs assistance Equipment used: Rolling walker (2 wheels) Transfers: Sit to/from Stand Sit to Stand: Contact guard assist           General transfer comment: Performed STS x 3 during session plus exercises; cued for hand placement    Ambulation/Gait Ambulation/Gait assistance: Contact guard assist Gait Distance (Feet): 200 Feet Assistive device: Rolling walker (2 wheels) Gait Pattern/deviations: Step-through pattern Gait velocity: decreased     General Gait Details: CGA for safety; cues for  RW with turning and backing   Stairs             Wheelchair Mobility     Tilt Bed    Modified Rankin (Stroke Patients Only)       Balance Overall balance assessment: Needs assistance Sitting-balance support: No upper extremity supported Sitting balance-Leahy Scale: Good     Standing balance support: No upper extremity supported, Bilateral upper extremity supported Standing balance-Leahy Scale: Fair Standing balance comment: RW to ambulate , could static stand wihtout support                            Communication    Cognition Arousal: Alert Behavior During Therapy: WFL for tasks assessed/performed   PT - Cognitive impairments: No apparent impairments                                Cueing    Exercises Other Exercises Other Exercises: STS x 10 wiht CGA; heel raises x 10 with CGA; used RW    General Comments General comments (skin integrity, edema, etc.): Pt on 6 L O2 with sats 96% rest and first 100' ambulation then 93% second 100' ambulation. Pt able to carry on conversation with walking      Pertinent Vitals/Pain Pain Assessment Pain Assessment: 0-10 Pain Score: 2  Pain Location: R leg Pain Descriptors / Indicators: Other (Comment) (heavy) Pain Intervention(s): Limited activity within patient's tolerance, Monitored during  session    Home Living                          Prior Function            PT Goals (current goals can now be found in the care plan section) Progress towards PT goals: Progressing toward goals    Frequency    Min 2X/week      PT Plan      Co-evaluation              AM-PAC PT 6 Clicks Mobility   Outcome Measure  Help needed turning from your back to your side while in a flat bed without using bedrails?: None Help needed moving from lying on your back to sitting on the side of a flat bed without using bedrails?: A Little Help needed moving to and from a bed to a chair  (including a wheelchair)?: A Little Help needed standing up from a chair using your arms (e.g., wheelchair or bedside chair)?: A Little Help needed to walk in hospital room?: A Little Help needed climbing 3-5 steps with a railing? : A Little 6 Click Score: 19    End of Session Equipment Utilized During Treatment: Gait belt;Oxygen Activity Tolerance: Patient tolerated treatment well;No increased pain Patient left: in chair;with call bell/phone within reach;with family/visitor present Nurse Communication: Mobility status PT Visit Diagnosis: Unsteadiness on feet (R26.81);Muscle weakness (generalized) (M62.81);Difficulty in walking, not elsewhere classified (R26.2) Pain - Right/Left: Right Pain - part of body: Leg;Knee;Ankle and joints of foot     Time: 8781-8752 PT Time Calculation (min) (ACUTE ONLY): 29 min  Charges:    $Gait Training: 8-22 mins $Therapeutic Exercise: 8-22 mins PT General Charges $$ ACUTE PT VISIT: 1 Visit                     Benjiman, PT Acute Rehab Services [facility] [phone]    Benjiman VEAR Mulberry 03/06/2024, 1:13 PM

## 2024-03-06 NOTE — Progress Notes (Signed)
 Pt informed about need to be NPO for abdominal US  for at least 6 hrs. Patient and husband informed and agreeable. Patient had last time something to drink at 10:30. US  will be planned around 4:30 pm.Sign placed on the door as well.

## 2024-03-06 NOTE — TOC Transition Note (Signed)
 Transition of Care [facility]) - Discharge Note   Patient Details  Name: Regina Young MRN: [id number] Date of Birth: 01/07/1956  Transition of Care [facility]) CM/SW Contact:  Bascom Service, RN Phone Number: 03/06/2024, 11:27 AM   Clinical Narrative: [facility] already following for HHPT/OT;await orders if home 02 needed, & documented sats.       Final next level of care: Home w Home Health Services Barriers to Discharge: No Barriers Identified   Patient Goals and CMS Choice   CMS Medicare.gov Compare Post Acute Care list provided to:: Patient Represenative (must comment) (Tony(spouse)) Choice offered to / list presented to : Spouse [place] ownership interest in [facility].provided to:: Spouse    Discharge Placement                       Discharge Plan and Services Additional resources added to the After Visit Summary for     Discharge Planning Services: CM Consult Post Acute Care Choice: Home Health                               Social Drivers of Health (SDOH) Interventions SDOH Screenings   Food Insecurity: No Food Insecurity (02/28/2024)  Housing: Low Risk  (02/28/2024)  Transportation Needs: No Transportation Needs (02/29/2024)  Utilities: Not At Risk (02/29/2024)  Depression (PHQ2-9): Low Risk  (01/17/2024)  Financial Resource Strain: Low Risk  (01/10/2024)  Social Connections: Moderately Isolated (02/29/2024)  Tobacco Use: Medium Risk (02/28/2024)     Readmission Risk Interventions    02/29/2024   11:10 AM  Readmission Risk Prevention Plan  Transportation Screening Complete  PCP or Specialist Appt within 3-5 Days Complete  HRI or Home Care Consult Complete  Social Work Consult for Recovery Care Planning/Counseling Complete  Palliative Care Screening Complete  Medication Review Oceanographer) Complete

## 2024-03-06 NOTE — Progress Notes (Signed)
  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 Patient placed on empiric IV antibiotics. Patient also seen in consultation by hematology/oncology.   Assessment and Plan: 1 acute hypoxic respiratory failure likely secondary to community-acquired pneumonia - Patient noted to have presented with fevers as high as 102.9, nausea, headache, right lower extremity pain. - Patient noted to be compliant with anticoagulation with Eliquis . - Chest x-ray done with bilateral upper lobe opacities concerning for infiltrate versus edema. - Blood cultures with no growth to date. - SARS coronavirus 2 PCR negative - Influenza A and B by PCR negative - RSV by PCR negative. - Resp viral panel neg - Place on PPI, Mucinex, Claritin, Flonase. - No improvement with IV azithromycin  and IV Rocephin . Later changed to vanc/cefepime , and now cefepime  alone - Appreciate Pulmonary input. Recs for continued bipap as tolerated -Fungitell and LDH pending   2.  Dehydration -IV fluids.   3.  Community-acquired pneumonia -Noted on chest x-ray bilateral upper lobe opacities. - Placed on PPI, Mucinex, Claritin, Flonase. -repeat CXR reviewed, unchanged -CTA chest with findings as per above -Was on azithro/rocephin . Now on cefepime  per pulm   4.  Hyponatremia -Felt likely secondary to a hypovolemic hyponatremia secondary to dehydration in the setting of an acute infection. - Improved with hydration.   5.  History of DVT -continue Eliquis .   6.  History of lung cancer with metastases -Continue home dose of MS Contin , oxycodone  as needed for breakthrough pain. - Patient being  followed by Dr. Timmy per oncology.   7.  Constipation resolved -MiraLAX  prn   8.  Transaminitis - Likely secondary to acute infection. - trending up -Recent ct abd reviewed. Biliary system appeared unremarkable on that study -Will check RUQ US       Subjective: States feeling better today  Physical Exam: Vitals:   03/06/24 0800 03/06/24 0831 03/06/24 1307 03/06/24 1503  BP:  113/74 116/69   Pulse:  (!) 103 88   Resp:  16 17   Temp:  98.1 F (36.7 C) 97.8 F (36.6 C)   TempSrc:  Oral    SpO2: 95% 96% 99% 94%  Weight:      Height:       General exam: Conversant, in no acute distress Respiratory system: normal chest rise, clear, no audible wheezing Cardiovascular system: regular rhythm, s1-s2 Gastrointestinal system: Nondistended, nontender, pos BS Central nervous system: No seizures, no tremors Extremities: No cyanosis, no joint deformities Skin: No rashes, no pallor Psychiatry: Affect normal // no auditory hallucinations   Data Reviewed:  Labs reviewed: Na 140, K 3.8, Cr 0.54, WBC 5.5, Hgb 8.3, Plts 348  Family Communication: Pt in room, family at bedside  Disposition: Status is: Inpatient Remains inpatient appropriate because: severity of illness  Planned Discharge Destination: Home with Home Health    Author: Garnette Pelt, MD 03/06/2024 4:48 PM  For on call review [email or web address].

## 2024-03-06 NOTE — Progress Notes (Signed)
 Pt care assumed from previous RN at 1500 this shift. Previous assessment reviewed and pt is resting comfortably on 6L O2 at this time and in no acute distress. Continue plan of care.

## 2024-03-06 NOTE — Progress Notes (Signed)
   03/06/24 2238  BiPAP/CPAP/SIPAP  BiPAP/CPAP/SIPAP Pt Type Adult  BiPAP/CPAP/SIPAP V60  Mask Type Full face mask  Dentures removed? Not applicable  Mask Size Small  Set Rate 12 breaths/min  Respiratory Rate 25 breaths/min  IPAP 8 cmH20  EPAP 5 cmH2O  FiO2 (%) 40 %  Minute Ventilation 10.4  Leak 14  Peak Inspiratory Pressure (PIP) 8  Tidal Volume (Vt) 605  Patient Home Machine No  Patient Home Mask No  Patient Home Tubing No  Auto Titrate No  Press High Alarm 35 cmH2O  Press Low Alarm 5 cmH2O  Device Plugged into RED Power Outlet Yes

## 2024-03-06 NOTE — Progress Notes (Signed)
 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  This in the background of stage I colonic adenocarcinoma in June 2022.  She also has a history of right posterior tibial vein DVT for which she is on Eliquis  5 mg twice daily.  She saw Dr. Maude Crease March 06, 2024 and at that point had completed radiotherapy to the sacral metastasis.  She was also on Decadron .  She was recommended carboplatin, Alimta and Keytruda.  This has not been started as of yet and resume June 2025 ECOG was reported as 1.  She underwent right Port-A-Cath 02/22/24.  Then on 6/23/5 was admitted to the hospital with acute hypoxemic respiratory failure diagnosed as community-acquired pneumonia initially.  She had a high fever of 102 point 9 in the morning of admission associated nausea and headache and had new oxygen requirement of 3 L nasal cannula.  Chest x-ray with new upper lobe opacities.  Flu, COVID and SARS PCR negative.  She is on opioids suggest MS Contin  for her sacral metastasis. 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   reports that she quit smoking about 2 months ago. Her smoking use included cigarettes. She has a 12.5 pack-year smoking history. She has been exposed to tobacco smoke. She has never used smokeless tobacco.  Significant Hospital Events:  02/18/2024: Office visit with Dr. Crease plan was to taper of Decadron .  Plan was to get Port-A-Cath and start patient on carboplatin/Alimta/Keytruda.  He noticed that ECOG is 1 Eliquis  was being continued.  He noticed that she had completed radiotherapy to the sacral metastasis  02/28/2024 - admit: : Blood cultures negative  02/29/2024: Chest x-ray with upper lobe opacities.  Requiring 3 L nasal cannula.  Continued on Rocephin  and azithromycin .  03/01/24: Continued on 3 L nasal cannula and antibiotics.  Eliquis  continued.  She was seen by oncology consult Dr. Crease.  Right radicular pain noticed and Dr. Crease increased her gabapentin .  - Carbo/Alimta/Keytruda -- tart cycle #1 on 03/01/2024  - GOING TO START but did ot happen due to ilness  03/02/2024: Pulm embolism ruled out.  03/03/2024: Dr. Crease conserver temperature spike.  Concerning of desaturations continued fevers documented.  CAP  Antibiotics continued.  Respiratory virus panel multiplex negative  03/04/2024: PCCM consulted because of worsening hypoxemia now requiring 6 L.  Record review shows making fevers throughout the course of hospitalization.  - MRSA PCR negative   - Urine strep -3  03/06/2024: On BiPAP at  night  Interim History / Subjective:   Fever curve improving.  Patient feels better  Objective   Blood pressure 113/74, pulse (!) 103, temperature 98.1 F (36.7 C), temperature source Oral, resp. rate 16, height 5' 2 (1.575 m), weight 48.6 kg, SpO2 96%.    FiO2 (%):  [40 %] 40 %   Intake/Output  Summary (Last 24 hours) at 03/06/2024 9072 Last data filed at 03/06/2024 0910 Gross per 24 hour  Intake 779.77 ml  Output --  Net 779.77 ml   Filed Weights   02/28/24 2000  Weight: 48.6 kg   mine  General Appearance:  Gen:      No acute distress HEENT:  EOMI, sclera anicteric Neck:     No masses; no thyromegaly Lungs:    Mild bilateral crackles CV:         Regular rate and rhythm; no murmurs Abd:      + bowel sounds; soft, non-tender; no palpable masses, no distension Ext:    No edema; adequate peripheral perfusion Neuro: alert and oriented x 3 Psych: normal mood and affect    Resolved Hospital Problem list     Assessment & Plan:   Acute hypoxemic respiratory failure with upper lobe interstitial infiltrates -new onset this admission and progressively getting worse.  Not on chemotherapy.  Differential diagnose includes HCAP, lymphangitis carcinomatosis in highly unlikely but possible would be acute hypersensitive pneumonitis since she does not have any exposures.  Pneumocystis also considered but unlikely  Pneumonia Hospital-acquired pneumonia with dense consolidation in the right lower lobe and inflammatory changes in the upper lobes, likely exacerbated by recent radiation therapy. Symptoms have improved with the current treatment regimen. - Continue current antibiotics for hospital-acquired pneumonia - Continue hydrocortisone steroids to reduce inflammation - Encourage use of incentive spirometer to improve lung function - Consult physical therapy for mobilization and strength building - Check Fungitell and LDH - Ensure physical therapy is working with patient.  Encouraged her to use incentive spirometer and flutter valve  Lung cancer, history of colon cancer Recent diagnosis of lung cancer with a small nodule in the right lung. Completed 20 sessions of radiation therapy, 10 of which were directed at the lung. - Coordinate with oncology for ongoing cancer  management    Best practice (daily eval):  According to [facility] Goals of Care:   Full code but no CPR no intubation very appropriate Family Updates: Updated patient at the bedside and sister and husband 03/05/24  Signature:   Natasha Paulson MD [facility] See Amion for pager  If no response to pager , please call [phone] until 7pm After 7:00 pm call Elink  [phone] 03/06/2024, 11:41 AM

## 2024-03-07 DIAGNOSIS — R918 Other nonspecific abnormal finding of lung field: Secondary | ICD-10-CM | POA: Diagnosis not present

## 2024-03-07 DIAGNOSIS — R509 Fever, unspecified: Secondary | ICD-10-CM | POA: Diagnosis not present

## 2024-03-07 DIAGNOSIS — J189 Pneumonia, unspecified organism: Secondary | ICD-10-CM | POA: Diagnosis not present

## 2024-03-07 DIAGNOSIS — J9601 Acute respiratory failure with hypoxia: Secondary | ICD-10-CM | POA: Diagnosis not present

## 2024-03-07 LAB — GLOMERULAR BASEMENT MEMBRANE ANTIBODIES: GBM Ab: <0.2 U (ref 0.0–0.9)

## 2024-03-07 LAB — CBC WITH DIFFERENTIAL/PLATELET
Abs Immature Granulocytes: 0.54 10*3/uL — ABNORMAL HIGH (ref 0.00–0.07)
Basophils Absolute: 0 10*3/uL (ref 0.0–0.1)
Basophils Relative: 0 %
Eosinophils Absolute: 0 10*3/uL (ref 0.0–0.5)
Eosinophils Relative: 0 %
HCT: 25.8 % — ABNORMAL LOW (ref 36.0–46.0)
Hemoglobin: 7.9 g/dL — ABNORMAL LOW (ref 12.0–15.0)
Immature Granulocytes: 7 %
Lymphocytes Relative: 3 %
Lymphs Abs: 0.3 10*3/uL — ABNORMAL LOW (ref 0.7–4.0)
MCH: 28.7 pg (ref 26.0–34.0)
MCHC: 30.6 g/dL (ref 30.0–36.0)
MCV: 93.8 fL (ref 80.0–100.0)
Monocytes Absolute: 0.3 10*3/uL (ref 0.1–1.0)
Monocytes Relative: 3 %
Neutro Abs: 7.3 10*3/uL (ref 1.7–7.7)
Neutrophils Relative %: 87 %
Platelets: 368 10*3/uL (ref 150–400)
RBC: 2.75 MIL/uL — ABNORMAL LOW (ref 3.87–5.11)
RDW: 15.7 % — ABNORMAL HIGH (ref 11.5–15.5)
WBC: 8.4 10*3/uL (ref 4.0–10.5)
nRBC: 0 % (ref 0.0–0.2)

## 2024-03-07 LAB — COMPREHENSIVE METABOLIC PANEL WITH GFR
ALT: 520 U/L — ABNORMAL HIGH (ref 0–44)
AST: 444 U/L — ABNORMAL HIGH (ref 15–41)
Albumin: 1.8 g/dL — ABNORMAL LOW (ref 3.5–5.0)
Alkaline Phosphatase: 193 U/L — ABNORMAL HIGH (ref 38–126)
Anion gap: 9 (ref 5–15)
BUN: 32 mg/dL — ABNORMAL HIGH (ref 8–23)
CO2: 23 mmol/L (ref 22–32)
Calcium: 8 mg/dL — ABNORMAL LOW (ref 8.9–10.3)
Chloride: 106 mmol/L (ref 98–111)
Creatinine, Ser: 0.69 mg/dL (ref 0.44–1.00)
GFR, Estimated: >60 mL/min (ref >60)
Glucose, Bld: 114 mg/dL — ABNORMAL HIGH (ref 70–99)
Potassium: 3.4 mmol/L — ABNORMAL LOW (ref 3.5–5.1)
Sodium: 138 mmol/L (ref 135–145)
Total Bilirubin: 0.5 mg/dL (ref 0.0–1.2)
Total Protein: 5.7 g/dL — ABNORMAL LOW (ref 6.5–8.1)

## 2024-03-07 LAB — EXTRACTABLE NUCLEAR ANTIGEN ANTIBODY
ENA SM Ab Ser-aCnc: <0.2 AI (ref 0.0–0.9)
Ribonucleic Protein: <0.2 AI (ref 0.0–0.9)
SSA (Ro) (ENA) Antibody, IgG: <0.2 AI (ref 0.0–0.9)
SSB (La) (ENA) Antibody, IgG: <0.2 AI (ref 0.0–0.9)
Scleroderma (Scl-70) (ENA) Antibody, IgG: <0.2 AI (ref 0.0–0.9)
ds DNA Ab: <1 [IU]/mL (ref 0–9)

## 2024-03-07 LAB — LACTATE DEHYDROGENASE: LDH: 601 U/L — ABNORMAL HIGH (ref 98–192)

## 2024-03-07 LAB — CYCLIC CITRUL PEPTIDE ANTIBODY, IGG/IGA: CCP Antibodies IgG/IgA: 8 U (ref 0–19)

## 2024-03-07 LAB — HEPATITIS PANEL, ACUTE
HCV Ab: NONREACTIVE
Hep A IgM: NONREACTIVE
Hep B C IgM: NONREACTIVE
Hepatitis B Surface Ag: NONREACTIVE

## 2024-03-07 LAB — ERYTHROPOIETIN: Erythropoietin: 20.7 m[IU]/mL — ABNORMAL HIGH (ref 2.6–18.5)

## 2024-03-07 LAB — RHEUMATOID FACTOR: Rheumatoid fact SerPl-aCnc: 15.6 [IU]/mL — ABNORMAL HIGH (ref <14.0)

## 2024-03-07 MED ORDER — POTASSIUM CHLORIDE CRYS ER 20 MEQ PO TBCR
60.0000 meq | EXTENDED_RELEASE_TABLET | Freq: Once | ORAL | Status: AC
  Administered 2024-03-07: 60 meq via ORAL
  Filled 2024-03-07: qty 3

## 2024-03-07 NOTE — Progress Notes (Signed)
 Ms. Schuelke says she is feeling better.  She says her breathing is doing better.  She uses the BiPAP at night.  However this is helping with her lungs.  Her LFTs continue to be on the rise.  She had an ultrasound yesterday.  This was relatively unremarkable of her liver.  Again, I had believe this is secondary to medications.  Her appetite might be a little bit better.  She has had no nausea or vomiting.  She has had no diarrhea.  She has had no bleeding.  She has had no fever.  Her labs show that her blood count continues to drop.  Her white cell count is 8.4.  Hemoglobin 7.9.  Platelet count 368,000.  Will have to watch this closely.  Her iron studies show that iron saturation is 25%.  Again, she may need to be transfused.  And we will have to see how everything looks tomorrow.  Her LFTs show that her SGPT is 520 SGOT 444.  Bilirubin is still normal at 0.5.  Her BUN is 32 creatinine 0.69.  She is not complain of any pain.  She feels that her right foot might be a little bit better.  She has had no mouth sores.  She has had no bleeding.  Is been no double vision.   Her vital signs are temperature 97.8.  Pulse 93.  Blood pressure 128/88.  Her head and neck exam shows no ocular or oral lesions.  She has no adenopathy in the neck.  Lungs are clear bilaterally.  Cardiac exam regular rate and rhythm.  She has no murmurs, rubs or bruits.  Abdomen is soft.  There is no fluid wave.  There is no guarding or rebound tenderness.  There is no palpable liver or spleen tip.  Extremities shows no clubbing, cyanosis or edema.  Skin exam shows no rashes, ecchymosis or petechia.   At this point, I would like to hope that the allergies will improve.  Again had believe this is from antibiotics.  At the present time, she really is not bothered by the increase in LFTs.  I am glad that she is able to use this BiPAP machine.  Hopefully, this will help with her lungs.  It is hard to say when she would be able to go  home.  I know that she is trying hard.  I know that she is eating better.  I think this will certainly help her overall status.  I know that the staff on [address] are doing a tremendous job with her.   Jeralyn Crease, MD  Psalm 57: 10

## 2024-03-07 NOTE — Progress Notes (Signed)
 Occupational Therapy Treatment Patient Details Name: Regina Young MRN: [id number] DOB: 12-09-1955 Today's Date: 03/07/2024   History of present illness 68 yo female presents to therapy following hospital admission on 02/28/2024 due to elevated tempeture, SOB and AMS. Pt found to have acute respiratory failure secondary to CAP. 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   OT comments  Patient seen for skilled OT session with focus on energy conservation, light therex and breathing strategies within AM ADL's. Husband present for education as well and Handouts provided. See below for status. Patient making steady gains with improved VSR to all activity presented.Patient requires continued Acute care hospital level OT services to progress safety and functional performance and allow for discharge. OT discharge recommendations remain appropriate.        If plan is discharge home, recommend the following:  A little help with walking and/or transfers;A little help with bathing/dressing/bathroom;Assistance with cooking/housework;Direct supervision/assist for medications management;Direct supervision/assist for financial management;Assist for transportation;Help with stairs or ramp for entrance   Equipment Recommendations  None recommended by OT       Precautions / Restrictions Precautions Precautions: Fall Recall of Precautions/Restrictions: Intact Precaution/Restrictions Comments: Mon HR and O2 Restrictions Weight Bearing Restrictions Per Provider Order: No       Mobility Bed Mobility Overal bed mobility: Needs Assistance Bed Mobility: Supine to Sit     Supine to sit: Supervision          Transfers Overall transfer level: Needs assistance Equipment used: Rolling walker (2 wheels) Transfers: Sit to/from Stand, Bed to chair/wheelchair/BSC Sit to Stand: Contact guard  assist     Step pivot transfers: Contact guard assist     General transfer comment: need min cues for steps d/t s/s of anxiety during mobility, husband present to educ to allow patient time and trials of patient effort vs assist     Balance Overall balance assessment: Needs assistance Sitting-balance support: No upper extremity supported Sitting balance-Leahy Scale: Good     Standing balance support: During functional activity Standing balance-Leahy Scale: Fair Standing balance comment: RW during transfers and amb                           ADL either performed or assessed with clinical judgement   ADL Overall ADL's : Needs assistance/impaired Eating/Feeding: Independent;Sitting   Grooming: Wash/dry hands;Wash/dry face;Sitting;Set up   Upper Body Bathing: Set up;Sitting   Lower Body Bathing: Contact guard assist;Sit to/from stand           Toilet Transfer: Supervision/safety;Rolling walker (2 wheels)   Toileting- Clothing Manipulation and Hygiene: Set up;Sitting/lateral lean       Functional mobility during ADLs: Contact guard assist General ADL Comments: VSS during am ADL session    Extremity/Trunk Assessment Upper Extremity Assessment Upper Extremity Assessment: Generalized weakness   Lower Extremity Assessment Lower Extremity Assessment: Generalized weakness        Vision   Vision Assessment?: No apparent visual deficits;Wears glasses for reading;Wears glasses for driving         Communication Communication Communication: No apparent difficulties   Cognition Arousal: Alert Behavior During Therapy: WFL for tasks assessed/performed Cognition: No apparent impairments                               Following commands: Intact  Cueing   Cueing Techniques: Verbal cues  Exercises Other Exercises Other Exercises: OT tband light only seated 10 reps sh flex and sh horizontal abd with min cues and handouts Other Exercises:  breathing integration with posted techniques and pacing    Shoulder Instructions       General Comments HR reamined stable with all ADLs and therex 94-104 bpm, SpO2 98%-100% on HFO2 via [place], reinforced IS and flutter as per MD rec    Pertinent Vitals/ Pain       Pain Assessment Pain Assessment: No/denies pain   Frequency  Min 2X/week        Progress Toward Goals  OT Goals(current goals can now be found in the care plan section)  Progress towards OT goals: Progressing toward goals  Acute Rehab OT Goals Patient Stated Goal: to get stronger OT Goal Formulation: With patient/family Time For Goal Achievement: 03/15/24 Potential to Achieve Goals: Good ADL Goals Pt Will Perform Lower Body Bathing: with modified independence;sit to/from stand Pt Will Perform Lower Body Dressing: with supervision;sit to/from stand Pt Will Transfer to Toilet: with supervision;ambulating;regular height toilet;grab bars Pt Will Perform Tub/Shower Transfer: Shower transfer;ambulating;shower seat;with contact guard assist Pt/caregiver will Perform Home Exercise Program: Both right and left upper extremity;Increased strength;With written HEP provided;Independently Additional ADL Goal #1: Patient will teach back and integrate 3/4 ECTs for ADL's and mobility with min cues  Plan      AM-PAC OT 6 Clicks Daily Activity     Outcome Measure   Help from another person eating meals?: None Help from another person taking care of personal grooming?: A Little Help from another person toileting, which includes using toliet, bedpan, or urinal?: A Little Help from another person bathing (including washing, rinsing, drying)?: A Little Help from another person to put on and taking off regular upper body clothing?: None Help from another person to put on and taking off regular lower body clothing?: A Little 6 Click Score: 20    End of Session Equipment Utilized During Treatment: Gait belt;Rolling walker (2  wheels);Oxygen  OT Visit Diagnosis: Unsteadiness on feet (R26.81);Muscle weakness (generalized) (M62.81)   Activity Tolerance Patient tolerated treatment well   Patient Left in chair;with call bell/phone within reach;with chair alarm set;with family/visitor present   Nurse Communication Other (comment) (BM and void data on flowsheets)        Time: 9155-9078 OT Time Calculation (min): 37 min  Charges: OT General Charges $OT Visit: 1 Visit OT Treatments $Self Care/Home Management : 8-22 mins $Therapeutic Exercise: 8-22 mins Fortune Brannigan OT/L Acute Rehabilitation Department  [phone]  03/07/2024, 9:30 AM

## 2024-03-07 NOTE — Progress Notes (Signed)
 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  This in the background of stage I colonic adenocarcinoma in June 2022.  She also has a history of right posterior tibial vein DVT for which she is on Eliquis  5 mg twice daily.  She saw Dr. Maude Crease March 06, 2024 and at that point had completed radiotherapy to the sacral metastasis.  She was also on Decadron .  She was recommended carboplatin, Alimta and Keytruda.  This has not been started as of yet and resume June 2025 ECOG was reported as 1.  She underwent right Port-A-Cath 02/22/24.  Then on 6/23/5 was admitted to the hospital with acute hypoxemic respiratory failure diagnosed as community-acquired pneumonia initially.  She had a high fever of 102 point 9 in the morning of admission associated nausea and headache and had new oxygen requirement of 3 L nasal cannula.  Chest x-ray with new upper lobe opacities.  Flu, COVID and SARS PCR negative.  She is on opioids suggest MS Contin  for her sacral metastasis. 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   reports that she quit smoking about 2 months ago. Her smoking use included cigarettes. She has a 12.5 pack-year smoking history. She has been exposed to tobacco smoke. She has never used smokeless tobacco.  Significant Hospital Events:  02/18/2024: Office visit with Dr. Crease plan was to taper of Decadron .  Plan was to get Port-A-Cath and start patient on carboplatin/Alimta/Keytruda.  He noticed that ECOG is 1 Eliquis  was being continued.  He noticed that she had completed radiotherapy to the sacral metastasis  02/28/2024 - admit: : Blood cultures negative  02/29/2024: Chest x-ray with upper lobe opacities.  Requiring 3 L nasal cannula.  Continued on Rocephin  and azithromycin .  03/01/24: Continued on 3 L nasal cannula and antibiotics.  Eliquis  continued.  She was seen by oncology consult Dr. Crease.  Right radicular pain noticed and Dr. Crease increased her gabapentin .  - Carbo/Alimta/Keytruda -- tart cycle #1 on 03/01/2024  - GOING TO START but did ot happen due to ilness  03/02/2024: Pulm embolism ruled out.  03/03/2024: Dr. Crease conserver temperature spike.  Concerning of desaturations continued fevers documented.  CAP  Antibiotics continued.  Respiratory virus panel multiplex negative  03/04/2024: PCCM consulted because of worsening hypoxemia now requiring 6 L.  Record review shows making fevers throughout the course of hospitalization.  - MRSA PCR negative   - Urine strep -3  03/06/2024: On BiPAP at  night  Interim History / Subjective:   Afebrile. No acute events overnight  Objective   Blood pressure 128/88, pulse 93, temperature 97.8 F (36.6 C), temperature source Oral, resp. rate 20, height 5' 2 (1.575 m), weight 48.6 kg, SpO2 96%.    FiO2 (%):  [40 %] 40 %   Intake/Output Summary (Last  24 hours) at 03/07/2024 0857 Last data filed at 03/07/2024 0630 Gross per 24 hour  Intake 920 ml  Output --  Net 920 ml   Filed Weights   02/28/24 2000  Weight: 48.6 kg   mine  General Appearance:  Gen:      No acute distress HEENT:  EOMI, sclera anicteric Neck:     No masses; no thyromegaly Lungs:    Clear to auscultation bilaterally; normal respiratory effort CV:         Regular rate and rhythm; no murmurs Abd:      + bowel sounds; soft, non-tender; no palpable masses, no distension Ext:    No edema; adequate peripheral perfusion Neuro: alert and oriented x 3 Psych: normal mood and affect     Resolved Hospital Problem list     Assessment & Plan:   Acute hypoxemic respiratory failure with upper lobe interstitial infiltrates -new onset this admission and progressively getting worse.  Not on chemotherapy.  Differential diagnose includes HCAP, lymphangitis carcinomatosis in highly unlikely but possible would be acute hypersensitive pneumonitis since she does not have any exposures.  Pneumocystis also considered but unlikely  Pneumonia Hospital-acquired pneumonia with dense consolidation in the right lower lobe and inflammatory changes in the upper lobes, likely exacerbated by recent radiation therapy. Symptoms have improved with the current treatment regimen. - Continue current antibiotics for hospital-acquired pneumonia - Continue hydrocortisone steroids to reduce inflammation - Encourage use of incentive spirometer to improve lung function - Consult physical therapy for mobilization and strength building - Check Fungitell - Ensure physical therapy is working with patient.  Encouraged her to use incentive spirometer and flutter valve  Lung cancer, history of colon cancer Recent diagnosis of lung cancer with a small nodule in the right lung. Completed 20 sessions of radiation therapy, 10 of which were directed at the lung. - Coordinate with oncology for ongoing cancer  management   Best practice (daily eval):  According to [facility] Goals of Care:   Full code but no CPR no intubation very appropriate Family Updates: Updated patient at the bedside and sister and husband 03/05/24  Signature:   Venola Castello MD [facility] See Amion for pager  If no response to pager , please call [phone] until 7pm After 7:00 pm call Elink  [phone] 03/07/2024, 8:57 AM

## 2024-03-07 NOTE — Plan of Care (Signed)

## 2024-03-07 NOTE — Progress Notes (Signed)
  Progress Note   Patient: Regina Young FMW:[id number] DOB: 1956/03/28 DOA: 02/28/2024     8 DOS: the patient was seen and examined on 03/07/2024   Brief hospital course: 68 year old female history of metastatic lung cancer, DVT on chronic anticoagulation with Eliquis , GERD, hyperlipidemia presented to the hospital acute hypoxic respiratory failure secondary to community-acquired pneumonia. Patient placed on empiric IV antibiotics. Patient also seen in consultation by hematology/oncology.   Assessment and Plan: 1 acute hypoxic respiratory failure likely secondary to community-acquired pneumonia - Patient noted to have presented with fevers as high as 102.9, nausea, headache, right lower extremity pain. - Chest x-ray done with bilateral upper lobe opacities concerning for infiltrate versus edema. - Blood cultures with no growth to date. - SARS coronavirus 2 PCR negative - Influenza A and B by PCR negative - RSV by PCR negative. - Resp viral panel neg - Little improvement with IV azithromycin  and Rocephin , thus Pulmonary was consulted. Abx changed to vanc/cefepime , and now cefepime  alone - Pulm rec to cover HCAP with hydrocortisone.  - Now improving and no longer febrile   2.  Dehydration -improved with hydration   3.  Community-acquired pneumonia -Noted on chest x-ray bilateral upper lobe opacities. - Placed on PPI, Mucinex, Claritin, Flonase. -repeat CXR reviewed, unchanged -CTA chest with findings as per above -cont abx per above   4.  Hyponatremia -Felt likely secondary to a hypovolemic hyponatremia secondary to dehydration in the setting of an acute infection. - Improved with hydration.   5.  History of DVT -continue Eliquis .   6.  History of lung cancer with metastases -Continue home dose of MS Contin , oxycodone  as needed for breakthrough pain. - Patient being followed by Dr. Timmy per oncology. 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 Renal function normal   7.  Constipation resolved -MiraLAX  prn   8.  Transaminitis - Suspected secondary to acute infection. - Initial trend down, now trending up -Recent ct abd reviewed. Biliary system appeared unremarkable on that study -Reviewed RUQ US . No gall stones, no biliary dilitation  -Will check acute hepatitis panel, CMV, EBV panels -Recheck LFT in AM. If no improvement or further worsening, may need GI input      Subjective: Reports feeling better. No longer febrile  Physical Exam: Vitals:   03/07/24 0835 03/07/24 0838 03/07/24 1306 03/07/24 1342  BP:   106/65   Pulse:   85   Resp:   20   Temp:   97.8 F (36.6 C)   TempSrc:   Oral   SpO2: 96% 96% 100% 97%  Weight:      Height:       General exam: Awake, laying in bed, in nad Respiratory system: Normal respiratory effort, no wheezing Cardiovascular system: regular rate, s1, s2 Gastrointestinal system: Soft, nondistended, positive BS Central nervous system: CN2-12 grossly intact, strength intact Extremities: Perfused, no clubbing Skin: Normal skin turgor, no notable skin lesions seen Psychiatry: Mood normal // no visual hallucinations    Data Reviewed:  Labs reviewed: Na 138, K 3.4, Cr 0.69, Alk phos 193, AST 444, ALT 520, WBC 8.4, Hgb 7.9, Plts 368  Family Communication: Pt in room, family at bedside  Disposition: Status is: Inpatient Remains inpatient appropriate because: severity of illness  Planned Discharge Destination: Home with Home Health    Author: Garnette Pelt, MD 03/07/2024 4:00 PM  For on call review [email or web address].

## 2024-03-08 DIAGNOSIS — E785 Hyperlipidemia, unspecified: Secondary | ICD-10-CM | POA: Diagnosis not present

## 2024-03-08 DIAGNOSIS — E871 Hypo-osmolality and hyponatremia: Secondary | ICD-10-CM

## 2024-03-08 DIAGNOSIS — D649 Anemia, unspecified: Secondary | ICD-10-CM

## 2024-03-08 DIAGNOSIS — R918 Other nonspecific abnormal finding of lung field: Secondary | ICD-10-CM | POA: Diagnosis not present

## 2024-03-08 DIAGNOSIS — J189 Pneumonia, unspecified organism: Secondary | ICD-10-CM | POA: Diagnosis not present

## 2024-03-08 DIAGNOSIS — J9601 Acute respiratory failure with hypoxia: Secondary | ICD-10-CM | POA: Diagnosis not present

## 2024-03-08 DIAGNOSIS — E860 Dehydration: Secondary | ICD-10-CM | POA: Diagnosis not present

## 2024-03-08 LAB — COMPREHENSIVE METABOLIC PANEL WITH GFR
ALT: 447 U/L — ABNORMAL HIGH (ref 0–44)
AST: 204 U/L — ABNORMAL HIGH (ref 15–41)
Albumin: 1.8 g/dL — ABNORMAL LOW (ref 3.5–5.0)
Alkaline Phosphatase: 177 U/L — ABNORMAL HIGH (ref 38–126)
Anion gap: 8 (ref 5–15)
BUN: 36 mg/dL — ABNORMAL HIGH (ref 8–23)
CO2: 23 mmol/L (ref 22–32)
Calcium: 8.1 mg/dL — ABNORMAL LOW (ref 8.9–10.3)
Chloride: 112 mmol/L — ABNORMAL HIGH (ref 98–111)
Creatinine, Ser: 0.64 mg/dL (ref 0.44–1.00)
GFR, Estimated: >60 mL/min (ref >60)
Glucose, Bld: 110 mg/dL — ABNORMAL HIGH (ref 70–99)
Potassium: 4 mmol/L (ref 3.5–5.1)
Sodium: 143 mmol/L (ref 135–145)
Total Bilirubin: 0.4 mg/dL (ref 0.0–1.2)
Total Protein: 5.3 g/dL — ABNORMAL LOW (ref 6.5–8.1)

## 2024-03-08 LAB — CBC WITH DIFFERENTIAL/PLATELET
Abs Immature Granulocytes: 1.14 10*3/uL — ABNORMAL HIGH (ref 0.00–0.07)
Basophils Absolute: 0.1 10*3/uL (ref 0.0–0.1)
Basophils Relative: 1 %
Eosinophils Absolute: 0 10*3/uL (ref 0.0–0.5)
Eosinophils Relative: 0 %
HCT: 25.5 % — ABNORMAL LOW (ref 36.0–46.0)
Hemoglobin: 7.7 g/dL — ABNORMAL LOW (ref 12.0–15.0)
Immature Granulocytes: 13 %
Lymphocytes Relative: 4 %
Lymphs Abs: 0.4 10*3/uL — ABNORMAL LOW (ref 0.7–4.0)
MCH: 28.7 pg (ref 26.0–34.0)
MCHC: 30.2 g/dL (ref 30.0–36.0)
MCV: 95.1 fL (ref 80.0–100.0)
Monocytes Absolute: 0.5 10*3/uL (ref 0.1–1.0)
Monocytes Relative: 5 %
Neutro Abs: 6.8 10*3/uL (ref 1.7–7.7)
Neutrophils Relative %: 77 %
Platelets: 377 10*3/uL (ref 150–400)
RBC: 2.68 MIL/uL — ABNORMAL LOW (ref 3.87–5.11)
RDW: 15.9 % — ABNORMAL HIGH (ref 11.5–15.5)
Smear Review: NORMAL
WBC: 8.9 10*3/uL (ref 4.0–10.5)
nRBC: 0.2 % (ref 0.0–0.2)

## 2024-03-08 LAB — EPSTEIN-BARR VIRUS (EBV) ANTIBODY PROFILE
EBV NA IgG: <18 U/mL (ref 0.0–17.9)
EBV VCA IgG: >600 U/mL — ABNORMAL HIGH (ref 0.0–17.9)
EBV VCA IgM: <36 U/mL (ref 0.0–35.9)

## 2024-03-08 LAB — ANCA TITERS
Atypical P-ANCA titer: <1:20 {titer}
C-ANCA: <1:20 {titer}
P-ANCA: <1:20 {titer}

## 2024-03-08 LAB — CMV IGM: CMV IgM: <30 [AU]/ml (ref 0.0–29.9)

## 2024-03-08 LAB — CMV ANTIBODY, IGG (EIA): CMV Ab - IgG: 9.5 U/mL — ABNORMAL HIGH (ref 0.00–0.59)

## 2024-03-08 LAB — PREPARE RBC (CROSSMATCH)

## 2024-03-08 MED ORDER — FUROSEMIDE 10 MG/ML IJ SOLN
20.0000 mg | Freq: Once | INTRAMUSCULAR | Status: AC
  Administered 2024-03-08: 20 mg via INTRAVENOUS
  Filled 2024-03-08: qty 2

## 2024-03-08 MED ORDER — SODIUM CHLORIDE 0.9% IV SOLUTION: Freq: Once | INTRAVENOUS | Status: AC

## 2024-03-08 MED ORDER — DARBEPOETIN ALFA 300 MCG/0.6ML IJ SOSY
300.0000 ug | PREFILLED_SYRINGE | Freq: Once | INTRAMUSCULAR | Status: AC
  Administered 2024-03-08: 300 ug via SUBCUTANEOUS
  Filled 2024-03-08: qty 0.6

## 2024-03-08 MED ORDER — SENNOSIDES-DOCUSATE SODIUM 8.6-50 MG PO TABS
1.0000 | ORAL_TABLET | Freq: Two times a day (BID) | ORAL | Status: DC
  Administered 2024-03-08 (×2): 1 via ORAL
  Filled 2024-03-08 (×3): qty 1

## 2024-03-08 MED ORDER — HYDROCORTISONE SOD SUC (PF) 100 MG IJ SOLR
50.0000 mg | Freq: Two times a day (BID) | INTRAMUSCULAR | Status: DC
  Administered 2024-03-08 – 2024-03-09 (×2): 50 mg via INTRAVENOUS
  Filled 2024-03-08 (×2): qty 1

## 2024-03-08 NOTE — Progress Notes (Signed)
   03/08/24 0004  BiPAP/CPAP/SIPAP  $ Non-Invasive Ventilator  Non-Invasive Vent Subsequent  BiPAP/CPAP/SIPAP Pt Type Adult  BiPAP/CPAP/SIPAP V60  Mask Type Full face mask  Dentures removed? Not applicable  Mask Size Small  Set Rate 12 breaths/min  Respiratory Rate 32 breaths/min  IPAP 8 cmH20  EPAP 5 cmH2O  FiO2 (%) 40 %  Flow Rate 0 lpm  Minute Ventilation 13.9  Leak 12  Peak Inspiratory Pressure (PIP) 8  Tidal Volume (Vt) 495  Patient Home Machine No  Patient Home Mask No  Patient Home Tubing No  Auto Titrate No  Press High Alarm 35 cmH2O  Press Low Alarm 5 cmH2O  CPAP/SIPAP surface wiped down Yes  Device Plugged into RED Power Outlet Yes  BiPAP/CPAP /SiPAP Vitals  Resp 18  MEWS Score/Color  MEWS Score 0  MEWS Score Color Green

## 2024-03-08 NOTE — Progress Notes (Signed)
 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  This in the background of stage I colonic adenocarcinoma in June 2022.  She also has a history of right posterior tibial vein DVT for which she is on Eliquis  5 mg twice daily.  She saw Dr. Maude Crease March 06, 2024 and at that point had completed radiotherapy to the sacral metastasis.  She was also on Decadron .  She was recommended carboplatin, Alimta and Keytruda.  This has not been started as of yet and resume June 2025 ECOG was reported as 1.  She underwent right Port-A-Cath 02/22/24.  Then on 6/23/5 was admitted to the hospital with acute hypoxemic respiratory failure diagnosed as community-acquired pneumonia initially.  She had a high fever of 102 point 9 in the morning of admission associated nausea and headache and had new oxygen requirement of 3 L nasal cannula.  Chest x-ray with new upper lobe opacities.  Flu, COVID and SARS PCR negative.  She is on opioids suggest MS Contin  for her sacral metastasis. 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   reports that she quit smoking about 2 months ago. Her smoking use included cigarettes. She has a 12.5 pack-year smoking history. She has been exposed to tobacco smoke. She has never used smokeless tobacco.  Significant Hospital Events:  02/18/2024: Office visit with Dr. Crease plan was to taper of Decadron .  Plan was to get Port-A-Cath and start patient on carboplatin/Alimta/Keytruda.  He noticed that ECOG is 1 Eliquis  was being continued.  He noticed that she had completed radiotherapy to the sacral metastasis  02/28/2024 - admit: : Blood cultures negative  02/29/2024: Chest x-ray with upper lobe opacities.  Requiring 3 L nasal cannula.  Continued on Rocephin  and azithromycin .  03/01/24: Continued on 3 L nasal cannula and antibiotics.  Eliquis  continued.  She was seen by oncology consult Dr. Crease.  Right radicular pain noticed and Dr. Crease increased her gabapentin .  - Carbo/Alimta/Keytruda -- tart cycle #1 on 03/01/2024  - GOING TO START but did ot happen due to ilness  03/02/2024: Pulm embolism ruled out.  03/03/2024: Dr. Crease conserver temperature spike.  Concerning of desaturations continued fevers documented.  CAP  Antibiotics continued.  Respiratory virus panel multiplex negative  03/04/2024: PCCM consulted because of worsening hypoxemia now requiring 6 L.  Record review shows making fevers throughout the course of hospitalization.  - MRSA PCR negative   - Urine strep -3  03/06/2024: On BiPAP at  night  Interim History / Subjective:   Afebrile. No acute events overnight Getting blood transfusion for anemia today  Objective   Blood pressure 135/89, pulse 89, temperature 97.9 F (36.6 C), temperature source Oral, resp. rate 16, height 5' 2 (1.575 m), weight 48.6 kg, SpO2 97%.    FiO2 (%):   [40 %] 40 %   Intake/Output Summary (Last 24 hours) at 03/08/2024 0754 Last data filed at 03/08/2024 0456 Gross per 24 hour  Intake 1280 ml  Output --  Net 1280 ml   Filed Weights   02/28/24 2000  Weight: 48.6 kg   mine  General Appearance:  Gen:      No acute distress HEENT:  EOMI, sclera anicteric Neck:     No masses; no thyromegaly Lungs:    Clear to auscultation bilaterally; normal respiratory effort CV:         Regular rate and rhythm; no murmurs Abd:      + bowel sounds; soft, non-tender; no palpable masses, no distension Ext:    No edema; adequate peripheral perfusion Neuro: alert and oriented x 3 Psych: normal mood and affect     Resolved Hospital Problem list     Assessment & Plan:   Acute hypoxemic respiratory failure with upper lobe interstitial infiltrates -new onset this admission and progressively getting worse.  Not on chemotherapy.  Differential diagnose includes HCAP, lymphangitis carcinomatosis in highly unlikely but possible would be acute hypersensitive pneumonitis since she does not have any exposures.  Pneumocystis also considered but unlikely  Pneumonia Hospital-acquired pneumonia with dense consolidation in the right lower lobe and inflammatory changes in the upper lobes, likely exacerbated by recent radiation therapy. Symptoms have improved with the current treatment regimen. - Continue current antibiotics for hospital-acquired pneumonia - Continue hydrocortisone steroids with taper to reduce inflammation - Encourage use of incentive spirometer to improve lung function - Consult physical therapy for mobilization and strength building, wean O2 as tolerated - Follow Fungitell - Ensure physical therapy is working with patient.  Encouraged her to use incentive spirometer and flutter valve  Lung cancer, history of colon cancer Recent diagnosis of lung cancer with a small nodule in the right lung. Completed 20 sessions of radiation therapy, 10 of which were  directed at the lung. - Coordinate with oncology for ongoing cancer management   Best practice (daily eval):  According to [facility] Goals of Care:   Full code but no CPR no intubation very appropriate Family Updates: Updated patient at the bedside and sister and husband 03/08/2024  Signature:   Summerlynn Glauser MD [facility] See Amion for pager  If no response to pager , please call [phone] until 7pm After 7:00 pm call Elink  [phone] 03/08/2024, 7:54 AM

## 2024-03-08 NOTE — Progress Notes (Signed)
 Physical Therapy Treatment Patient Details Name: Regina Young MRN: [id number] DOB: 29-Jul-1956 Today's Date: 03/08/2024   History of Present Illness 68 yo female presents to therapy following hospital admission on 02/28/2024 due to elevated tempeture, SOB and AMS. Pt found to have acute respiratory failure secondary to CAP. 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    PT Comments  Pt with good improvement today.  She was getting PRBC but has been tolerating transfers to [facility] well per nursing.  Pt eager to mobilize/walk in hallway.  She was on decreased O2 today (3L compared to 6L last visit) and able to tolerate same distance (200') with O2 sats 95% or greater.  Pt motivated and pleased with her progress.  Continue POC with recommendation for HHPT.     If plan is discharge home, recommend the following: A little help with walking and/or transfers;A little help with bathing/dressing/bathroom;Assistance with cooking/housework;Help with stairs or ramp for entrance;Assist for transportation   Can travel by private vehicle        Equipment Recommendations  None recommended by PT    Recommendations for Other Services       Precautions / Restrictions Precautions Precautions: Fall     Mobility  Bed Mobility Overal bed mobility: Needs Assistance Bed Mobility: Supine to Sit     Supine to sit: Supervision          Transfers Overall transfer level: Needs assistance Equipment used: Rolling walker (2 wheels) Transfers: Sit to/from Stand Sit to Stand: Supervision           General transfer comment: Pt verbalizing good safety techniques - pushing up to stand then backing till legs touch chair; did cue for controlled sit    Ambulation/Gait Ambulation/Gait assistance: Contact guard assist Gait Distance (Feet): 200 Feet Assistive device: Rolling walker (2 wheels) Gait  Pattern/deviations: Step-through pattern, Decreased stride length       General Gait Details: CGA for safety; ambulated 200' again with 1 standing rest break.  Did ambulate at decreased speed today but still functional   Stairs             Wheelchair Mobility     Tilt Bed    Modified Rankin (Stroke Patients Only)       Balance Overall balance assessment: Needs assistance Sitting-balance support: No upper extremity supported Sitting balance-Leahy Scale: Good     Standing balance support: Bilateral upper extremity supported, No upper extremity supported Standing balance-Leahy Scale: Fair Standing balance comment: RW for ambulation but could stand without support                            Communication    Cognition Arousal: Alert Behavior During Therapy: WFL for tasks assessed/performed   PT - Cognitive impairments: No apparent impairments                                Cueing    Exercises Other Exercises Other Exercises: Pt reports not doing IS or flutter today.  Performed flutter x5 and IS x 5 to each breath.  Did cue that she could take rest breaths between each rep, does not have to do sequential breaths.  Pt reports that is the most she has been able to get consistently on IS and tolerated better  with rest breaks    General Comments General comments (skin integrity, edema, etc.): HR stable 80's-90's throughout session.  Pt was on 3 L O2 with sats >95% throughout session      Pertinent Vitals/Pain Pain Assessment Pain Assessment: No/denies pain (R leg heavy but not painful)    Home Living                          Prior Function            PT Goals (current goals can now be found in the care plan section) Progress towards PT goals: Progressing toward goals    Frequency    Min 2X/week      PT Plan      Co-evaluation              AM-PAC PT 6 Clicks Mobility   Outcome Measure  Help needed  turning from your back to your side while in a flat bed without using bedrails?: None Help needed moving from lying on your back to sitting on the side of a flat bed without using bedrails?: A Little Help needed moving to and from a bed to a chair (including a wheelchair)?: A Little Help needed standing up from a chair using your arms (e.g., wheelchair or bedside chair)?: A Little Help needed to walk in hospital room?: A Little Help needed climbing 3-5 steps with a railing? : A Little 6 Click Score: 19    End of Session Equipment Utilized During Treatment: Gait belt;Oxygen Activity Tolerance: Patient tolerated treatment well;No increased pain Patient left: in chair;with call bell/phone within reach;with family/visitor present Nurse Communication: Mobility status PT Visit Diagnosis: Unsteadiness on feet (R26.81);Muscle weakness (generalized) (M62.81);Difficulty in walking, not elsewhere classified (R26.2) Pain - Right/Left: Right Pain - part of body: Leg;Knee;Ankle and joints of foot     Time: 8561-8495 PT Time Calculation (min) (ACUTE ONLY): 26 min  Charges:    $Gait Training: 8-22 mins $Therapeutic Exercise: 8-22 mins PT General Charges $$ ACUTE PT VISIT: 1 Visit                     Benjiman, PT Acute Rehab Services [facility] [phone]'    Benjiman VEAR Mulberry 03/08/2024, 3:13 PM

## 2024-03-08 NOTE — Plan of Care (Signed)

## 2024-03-08 NOTE — Progress Notes (Signed)
 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 Patient placed on empiric IV antibiotics. Patient also seen in consultation by hematology/oncology.  - Patient with worsening respiratory status, noted to have fevers and desaturation, CT angiogram chest done negative for PE, due to worsening hypoxemia PCCM consulted as patient had increased O2 requirements.  Patient subsequently placed on BiPAP.  PCCM following.   Assessment & Plan:   Principal Problem:   Acute respiratory failure with hypoxia (HCC) Active Problems:   CAP (community acquired pneumonia)   Hyperlipidemia   Lung cancer, primary, with metastasis from lung to other site, right (HCC)   Dehydration   DVT (deep venous thrombosis) (HCC)   Transaminitis   Hyponatremia   Normocytic anemia  #1 acute hypoxic respiratory failure secondary to community-acquired pneumonia -Patient presented with fevers with temps as high as 102.9, nausea headache, right lower extremity pain. - Chest x-ray done with bilateral upper lobe opacities concerning for infiltrate versus edema. - Blood cultures obtained with no growth to date. - SARS coronavirus 2 PCR negative. - Influenza A and B by PCR negative. - RSV by PCR negative. - Respiratory viral panel negative. - Patient initially placed on IV Rocephin  and IV azithromycin  however due to increased O2 requirements and no significant improvement CT angiogram chest was done which was negative for PE. - Patient seen in consultation by PCCM and antibiotics broadened to vancomycin  and cefepime . - Patient currently on IV cefepime  and also starting  hydrocortisone to help decrease inflammation.   - Continue gentle spirometry.   - Fungitell ordered and pending.   - Patient on BiPAP nightly.   - Fever curve trended down.   - PCCM following and appreciate input and recommendations.  2.  Dehydration -Improved with IV fluids.  3.  Community-acquired pneumonia -Presentation chest x-ray bilateral upper lobe opacities. - Patient noted to have a worsening hypoxemia, CT angiogram chest done negative for PE however did show new multifocal and confluent areas of smooth interlobular septal thickening with superimposed groundglass opacities predominantly throughout bilateral upper lobes with findings nonspecific may be related to pulmonary edema, ARDS, infection or other inflammatory processes. - Was on IV Rocephin  and azithromycin  due to hypoxemia broadened to IV vancomycin  and cefepime  and currently just on IV cefepime . - See #1.  4.  Hyponatremia -Fall secondary to hypovolemic hyponatremia secondary to dehydration in the setting of acute infection. - Resolved with hydration.  5.  Hypokalemia -Repleted.  6.  Transaminitis -Felt secondary to acute infection. - LFTs trending down. - Acute hepatitis panel negative. - CMV antibody IgG elevated at 9.50. - CMV IgM negative. - EBV IgM negative. - EBV IgG elevated > 600. - CT abdomen and pelvis done on day of admission with scattered rounded hypodensities throughout the liver favored a cyst, gallbladder and bile ducts within normal limits. - Right upper quadrant ultrasound done with no gallstones or ductal dilatation.  Hepatic cysts. - Transaminitis trending down.  7.  History of DVT -Eliquis .  8.  Constipation -Resolved. - MiraLAX  as needed.  9.  History of lung cancer with metastasis -Continue home regimen of MS Contin , oxycodone  as needed for breakthrough pain. 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 - Hematology/oncology following.  10.  Anemia -Patient with no overt bleeding. - Per hematology/oncology patient with low erythropoietin level of 20. - Hemoglobin currently 7.7 today. - 2 units PRBCs ordered for transfusion per hematology/oncology. - Per hematology oncology.   DVT prophylaxis: Eliquis  Code Status: Full Family Communication: Updated patient and husband at bedside. Disposition: Home with home health when clinically improved and cleared by pulmonary and oncology.  Status is: Inpatient Remains inpatient appropriate because: Severity of illness   Consultants:  Oncology: Dr. Timmy 02/29/2024 PCCM: Dr. Geronimo 03/04/2024  Procedures:  Transfusion 2 units PRBCs 03/08/2024 CT head without contrast 02/28/2024 CT abdomen and pelvis 02/28/2024 Chest x-ray 02/28/2024, 03/01/2024 CT chest 03/04/2024 CT angiogram chest 03/02/2024 Right upper quadrant ultrasound 03/06/2024  Significant Hospital Events:  02/18/2024: Office visit with Dr. Timmy plan was to taper of Decadron .  Plan was to get Port-A-Cath and start patient on carboplatin/Alimta/Keytruda.  He noticed that ECOG is 1 Eliquis  was being continued.  He noticed that she had completed radiotherapy to the sacral metastasis   02/28/2024 - admit: : Blood cultures negative   02/29/2024: Chest x-ray with upper lobe opacities.  Requiring 3 L nasal cannula.  Continued on Rocephin  and azithromycin .   03/01/24: Continued on 3 L nasal cannula and antibiotics.  Eliquis  continued.  She was seen by oncology consult Dr. Timmy.  Right radicular pain noticed and Dr. Timmy increased her gabapentin .             - Carbo/Alimta/Keytruda -- tart cycle #1 on 03/01/2024  - GOING TO START but did ot happen due to ilness   03/02/2024: Pulm embolism ruled out.   03/03/2024: Dr. Timmy conserver temperature spike.  Concerning of desaturations continued fevers documented.  CAP  Antibiotics continued.  Respiratory virus panel multiplex  negative   03/04/2024: PCCM consulted because of worsening hypoxemia now requiring 6 L.  Record review shows making fevers throughout the course of hospitalization.             - MRSA PCR negative              - Urine strep -3   03/06/2024: On BiPAP at night   Antimicrobials:  Anti-infectives (From admission, onward)    Start     Dose/Rate Route Frequency Ordered Stop   03/05/24 1700  vancomycin  (VANCOREADY) IVPB 750 mg/150 mL  Status:  Discontinued        750 mg 150 mL/hr over 60 Minutes Intravenous Every 24 hours 03/04/24 1452 03/05/24 1106   03/04/24 1600  ceFEPIme  (MAXIPIME ) 2 g in sodium chloride  0.9 % 100 mL IVPB        2 g 200 mL/hr over 30 Minutes Intravenous Every 12 hours 03/04/24 1449     03/04/24 1600  vancomycin  (VANCOCIN ) IVPB 1000 mg/200 mL premix        1,000 mg 200 mL/hr over 60 Minutes Intravenous  Once 03/04/24 1450 03/04/24 2315   02/29/24 1800  cefTRIAXone  (ROCEPHIN ) 2 g in sodium chloride  0.9 % 100 mL IVPB  Status:  Discontinued        2 g 200 mL/hr over 30 Minutes Intravenous Every 24 hours 02/29/24 0822 03/04/24 1440   02/28/24 1800  azithromycin  (ZITHROMAX ) 500 mg in sodium chloride  0.9 % 250 mL IVPB  Status:  Discontinued        500 mg 250 mL/hr over 60 Minutes Intravenous Every 24 hours 02/28/24 1656 03/04/24  1440   02/28/24 1800  cefTRIAXone  (ROCEPHIN ) 1 g in sodium chloride  0.9 % 100 mL IVPB  Status:  Discontinued        1 g 200 mL/hr over 30 Minutes Intravenous Every 24 hours 02/28/24 1656 02/29/24 0822   02/28/24 1115  ceFEPIme  (MAXIPIME ) 2 g in sodium chloride  0.9 % 100 mL IVPB        2 g 200 mL/hr over 30 Minutes Intravenous  Once 02/28/24 1103 02/28/24 1238   02/28/24 1115  metroNIDAZOLE  (FLAGYL ) IVPB 500 mg        500 mg 100 mL/hr over 60 Minutes Intravenous  Once 02/28/24 1103 02/28/24 1238   02/28/24 1115  vancomycin  (VANCOCIN ) IVPB 1000 mg/200 mL premix        1,000 mg 200 mL/hr over 60 Minutes Intravenous  Once 02/28/24 1103 02/28/24 1438          Subjective: Patient sitting up in bed, husband at bedside.  Just got transitioned to 3 L nasal cannula.  Noted to have been on the BiPAP overnight per PCCM recommendations.  Patient denies any chest pain.  Denies any significant shortness of breath.  No abdominal pain.  States appetite is slowly improving.  Objective: Vitals:   03/08/24 1219 03/08/24 1236 03/08/24 1251 03/08/24 1601  BP: 132/82  132/78 122/74  Pulse: 73  85 81  Resp: 19  14   Temp: 97.7 F (36.5 C) 98.1 F (36.7 C) 98.1 F (36.7 C) 98.3 F (36.8 C)  TempSrc: Oral Oral Oral Oral  SpO2: 100%  100% 100%  Weight:      Height:        Intake/Output Summary (Last 24 hours) at 03/08/2024 1852 Last data filed at 03/08/2024 1615 Gross per 24 hour  Intake 2016.85 ml  Output --  Net 2016.85 ml   Filed Weights   02/28/24 2000  Weight: 48.6 kg    Examination:  General exam: NAD. Respiratory system: Some scattered coarse breath sounds.  No wheezing.  Fair air movement.  Speaking in full sentences.  No use of accessory muscles of respiration.  On 3 L nasal cannula.  Cardiovascular system: S1 & S2 heard, RRR. No JVD, murmurs, rubs, gallops or clicks. No pedal edema. Gastrointestinal system: Abdomen is nondistended, soft and nontender. No organomegaly or masses felt. Normal bowel sounds heard. Central nervous system: Alert and oriented. No focal neurological deficits. Extremities: Symmetric 5 x 5 power. Skin: No rashes, lesions or ulcers Psychiatry: Judgement and insight appear normal. Mood & affect appropriate.     Data Reviewed: I have personally reviewed following labs and imaging studies  CBC: Recent Labs  Lab 03/04/24 0327 03/05/24 0347 03/06/24 0318 03/07/24 0420 03/08/24 0315  WBC 6.2 5.7 5.5 8.4 8.9  NEUTROABS  --   --   --  7.3 6.8  HGB 8.2* 8.2* 8.3* 7.9* 7.7*  HCT 26.9* 26.6* 27.3* 25.8* 25.5*  MCV 94.7 93.0 95.1 93.8 95.1  PLT 299 293 348 368 377    Basic Metabolic Panel: Recent  Labs  Lab 03/04/24 0327 03/05/24 0347 03/06/24 0318 03/07/24 0420 03/08/24 0315  NA 135 138 140 138 143  K 4.5 4.0 3.8 3.4* 4.0  CL 102 104 106 106 112*  CO2 24 24 23 23 23   GLUCOSE 103* 102* 133* 114* 110*  BUN 19 19 27* 32* 36*  CREATININE 0.67 0.48 0.54 0.69 0.64  CALCIUM  7.9* 8.0* 8.4* 8.0* 8.1*    GFR: Estimated Creatinine Clearance: 53.8 mL/min (by C-G  formula based on SCr of 0.64 mg/dL).  Liver Function Tests: Recent Labs  Lab 03/04/24 0327 03/05/24 0347 03/06/24 0318 03/07/24 0420 03/08/24 0315  AST 192* 239* 297* 444* 204*  ALT 192* 283* 364* 520* 447*  ALKPHOS 135* 157* 206* 193* 177*  BILITOT 0.6 0.5 0.4 0.5 0.4  PROT 5.7* 5.8* 5.9* 5.7* 5.3*  ALBUMIN 1.9* 1.9* 1.9* 1.8* 1.8*    CBG: No results for input(s): GLUCAP in the last 168 hours.   Recent Results (from the past 240 hours)  Blood Culture (routine x 2)     Status: None   Collection Time: 02/28/24 11:32 AM   Specimen: BLOOD  Result Value Ref Range Status   Specimen Description   Final    BLOOD BLOOD RIGHT ARM Performed at [facility], [address] [place], [place] [address]    Special Requests   Final    BOTTLES DRAWN AEROBIC AND ANAEROBIC Blood Culture adequate volume Performed at [facility], [address] [place], [place] [address]    Culture   Final    NO GROWTH 5 DAYS Performed at [facility], [address] [place], [place] [address]    Report Status 03/04/2024 FINAL  Final  Resp panel by RT-PCR (RSV, Flu A&B, Covid) Anterior Nasal Swab     Status: None   Collection Time: 02/28/24 12:07 PM   Specimen: Anterior Nasal Swab  Result Value Ref Range Status   SARS Coronavirus 2 by RT PCR NEGATIVE NEGATIVE Final    Comment: (NOTE) SARS-CoV-2 target nucleic acids are NOT DETECTED.  The SARS-CoV-2 RNA is generally detectable in upper respiratory specimens during the acute phase of infection. The lowest concentration of SARS-CoV-2  viral copies this assay can detect is 138 copies/mL. A negative result does not preclude SARS-Cov-2 infection and should not be used as the sole basis for treatment or other patient management decisions. A negative result may occur with  improper specimen collection/handling, submission of specimen other than nasopharyngeal swab, presence of viral mutation(s) within the areas targeted by this assay, and inadequate number of viral copies(<138 copies/mL). A negative result must be combined with clinical observations, patient history, and epidemiological information. The expected result is Negative.  Fact Sheet for Patients:  BloggerCourse.com  Fact Sheet for Healthcare Providers:  SeriousBroker.it  This test is no t yet approved or cleared by the [place]  FDA and  has been authorized for detection and/or diagnosis of SARS-CoV-2 by FDA under an Emergency Use Authorization (EUA). This EUA will remain  in effect (meaning this test can be used) for the duration of the COVID-19 declaration under Section 564(b)(1) of the Act, 21 U.S.C.section 360bbb-3(b)(1), unless the authorization is terminated  or revoked sooner.       Influenza A by PCR NEGATIVE NEGATIVE Final   Influenza B by PCR NEGATIVE NEGATIVE Final    Comment: (NOTE) The Xpert Xpress SARS-CoV-2/FLU/RSV plus assay is intended as an aid in the diagnosis of influenza from Nasopharyngeal swab specimens and should not be used as a sole basis for treatment. Nasal washings and aspirates are unacceptable for Xpert Xpress SARS-CoV-2/FLU/RSV testing.  Fact Sheet for Patients: BloggerCourse.com  Fact Sheet for Healthcare Providers: SeriousBroker.it  This test is not yet approved or cleared by the [place]  FDA and has been authorized for detection and/or diagnosis of SARS-CoV-2 by FDA under an Emergency Use Authorization  (EUA). This EUA will remain in effect (meaning this test can be used) for  the duration of the COVID-19 declaration under Section 564(b)(1) of the Act, 21 U.S.C. section 360bbb-3(b)(1), unless the authorization is terminated or revoked.     Resp Syncytial Virus by PCR NEGATIVE NEGATIVE Final    Comment: (NOTE) Fact Sheet for Patients: BloggerCourse.com  Fact Sheet for Healthcare Providers: SeriousBroker.it  This test is not yet approved or cleared by the [place]  FDA and has been authorized for detection and/or diagnosis of SARS-CoV-2 by FDA under an Emergency Use Authorization (EUA). This EUA will remain in effect (meaning this test can be used) for the duration of the COVID-19 declaration under Section 564(b)(1) of the Act, 21 U.S.C. section 360bbb-3(b)(1), unless the authorization is terminated or revoked.  Performed at [facility], [address] [place], [place] [address]   Blood Culture (routine x 2)     Status: None   Collection Time: 02/28/24 12:08 PM   Specimen: BLOOD  Result Value Ref Range Status   Specimen Description   Final    BLOOD BLOOD LEFT ARM Performed at [facility], [address] [place], [place] [address]    Special Requests   Final    BOTTLES DRAWN AEROBIC ONLY Blood Culture adequate volume Performed at [facility], [address], [place], [place] [address]    Culture   Final    NO GROWTH 5 DAYS Performed at [facility], [address], [place], [place] [address]    Report Status 03/04/2024 FINAL  Final  Urine Culture (for pregnant, neutropenic or urologic patients or patients with an indwelling urinary catheter)     Status: None   Collection Time: 02/29/24  8:30 AM   Specimen: Urine, Catheterized  Result Value Ref Range Status   Specimen Description   Final    URINE, CATHETERIZED Performed at [facility], [address], [place], [place] [address]    Special Requests   Final    NONE Performed at [facility], [address] [place], [place] [address]    Culture   Final    NO GROWTH Performed at [facility], [address], [place], [place] [address]    Report Status 03/01/2024 FINAL  Final  Respiratory (~20 pathogens) panel by PCR     Status: None   Collection Time: 03/03/24  6:42 PM   Specimen: Nasopharyngeal Swab; Respiratory  Result Value Ref Range Status   Adenovirus NOT DETECTED NOT DETECTED Final   Coronavirus 229E NOT DETECTED NOT DETECTED Final    Comment: (NOTE) The Coronavirus on the Respiratory Panel, DOES NOT test for the novel  Coronavirus (2019 nCoV)    Coronavirus HKU1 NOT DETECTED NOT DETECTED Final   Coronavirus NL63 NOT DETECTED NOT DETECTED Final   Coronavirus OC43 NOT DETECTED NOT DETECTED Final   Metapneumovirus NOT DETECTED NOT DETECTED Final   Rhinovirus / Enterovirus NOT DETECTED NOT DETECTED Final   Influenza A NOT DETECTED NOT DETECTED Final   Influenza B NOT DETECTED NOT DETECTED Final   Parainfluenza Virus 1 NOT DETECTED NOT DETECTED Final   Parainfluenza Virus 2 NOT DETECTED NOT DETECTED Final   Parainfluenza Virus 3 NOT DETECTED NOT DETECTED Final   Parainfluenza Virus 4 NOT DETECTED NOT DETECTED Final   Respiratory Syncytial Virus NOT DETECTED NOT DETECTED Final   Bordetella pertussis NOT DETECTED NOT DETECTED Final   Bordetella Parapertussis NOT DETECTED NOT DETECTED Final   Chlamydophila pneumoniae NOT DETECTED NOT DETECTED Final   Mycoplasma  pneumoniae NOT DETECTED NOT DETECTED Final    Comment: Performed at [facility], [address] [place], [place] [address]  MRSA Next Gen by PCR, Nasal     Status: None   Collection Time: 03/04/24  2:38 PM   Specimen: Nasal Mucosa; Nasal Swab  Result Value Ref Range Status   MRSA by PCR Next Gen NOT DETECTED NOT DETECTED Final    Comment: (NOTE) The  GeneXpert MRSA Assay (FDA approved for NASAL specimens only), is one component of a comprehensive MRSA colonization surveillance program. It is not intended to diagnose MRSA infection nor to guide or monitor treatment for MRSA infections. Test performance is not FDA approved in patients less than 43 years old. Performed at [facility], [address] [place], [place] [address]          Radiology Studies: No results found.       Scheduled Meds:  apixaban   5 mg Oral BID   calcium  citrate  200 mg of elemental calcium  Oral Daily   Chlorhexidine  Gluconate Cloth  6 each Topical Daily   cholecalciferol   2,000 Units Oral Q breakfast   cyanocobalamin   1,000 mcg Oral Daily   feeding supplement  1 Container Oral TID BM   fluticasone  2 spray Each Nare Daily   gabapentin   400 mg Oral TID   hydrocortisone sod succinate (SOLU-CORTEF) inj  50 mg Intravenous Q12H   ipratropium-albuterol   3 mL Nebulization TID   metoprolol tartrate  25 mg Oral BID   morphine   15 mg Oral Q12H   pantoprazole   40 mg Oral QAC breakfast   senna-docusate  1 tablet Oral BID   sodium chloride  flush  10-40 mL Intracatheter Q12H   Continuous Infusions:  ceFEPime  (MAXIPIME ) IV Stopped (03/08/24 1721)     LOS: 9 days    Time spent: 40 minutes    Toribio Hummer, MD [facility]   To contact the attending provider between 7A-7P or the covering provider during after hours 7P-7A, please log into the web site [email or web address] and access using universal [place] password for that web site. If you do not have the password, please call the hospital operator.  03/08/2024, 6:52 PM

## 2024-03-08 NOTE — Progress Notes (Signed)
 I am very impressed with how well Regina Young looks.  She really sounds good.  She really has no specific complaints.  Thankfully, her LFTs are improving.   I think the problem that we have is that her hemoglobin is dropping.  I really think she needs to be transfused.  I think this will help her heart and with her lungs.  She has a low erythropoietin level of 20.  We can certainly give her some Aranesp.  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  She is on Eliquis .  Her iron studies are okay.  I talked to her today about a blood transfusion.  Explained why I thought this would benefit her.  I explained that the transfusions are safe.  The patients do not get AIDS or hepatitis from a transfusion now.  I explained how they do the transfusion.  She understands this.  She agrees to the transfusion.  Her breathing seems to be doing okay.  She does not complain of any shortness of breath.  She is using the incentive spirometer.  She has had no issues with diarrhea.  There is been no melena or bright red blood per rectum.  She has had no rashes.  She has had no mouth sores.   Her CBC shows a white cell count of 8.9.  Hemoglobin 7.7.  Platelet count 377,000.  Her sodium 143.  Potassium 4.0.  BUN 36 creatinine 0.64.  Calcium  8.1 with albumin volume 1.8.  Her SGPT is 447 SGOT is 204.  Alkaline phosphatase is 177.  On her exam, her vital signs show temperature of 97.9.  Pulse 89.  Blood pressure 135/89.  Her lungs sound great.  She has good air movement bilaterally.  Cardiac exam regular rate and rhythm.  Abdomen is soft.  Bowel sounds are present.  There is no fluid wave.  There is no palpable liver or spleen tip.  Extremity shows no clubbing, cyanosis or edema.  Neurological exam shows no focal neurological deficits.  Regina Young has metastatic non-small cell lung cancer.  She came in with pneumonia.  She is improving nicely.  I think that she will do well with a transfusion.  We will go ahead and  transfuse her 2 units of blood today.  I will also give her a dose of Aranesp.  Hopefully, she will be able to go home real soon.  I do appreciate the incredible care she is getting from everybody on 4 E.   Jeralyn Crease, MD  [place] 4:10

## 2024-03-08 NOTE — TOC Progression Note (Addendum)
 Transition of Care [facility]) - Progression Note    Patient Details  Name: Regina Young MRN: [id number] Date of Birth: 07-05-1956  Transition of Care [facility]) CM/SW Contact  Zyiah Withington, Nathanel, RN Phone Number: 03/08/2024, 10:10 AM  Clinical Narrative: [facility] already following for HHPT/OT/RN-rep Lynette;on 02, may need [employer] rep Jermaine following if needed await orders, sats,& settings.  Has own transport home.    Expected Discharge Plan: Home w Home Health Services Barriers to Discharge: Continued Medical Work up  Expected Discharge Plan and Services   Discharge Planning Services: CM Consult Post Acute Care Choice: Home Health Living arrangements for the past 2 months: Single Family Home                                       Social Determinants of Health (SDOH) Interventions SDOH Screenings   Food Insecurity: No Food Insecurity (02/28/2024)  Housing: Low Risk  (02/28/2024)  Transportation Needs: No Transportation Needs (02/29/2024)  Utilities: Not At Risk (02/29/2024)  Depression (PHQ2-9): Low Risk  (01/17/2024)  Financial Resource Strain: Low Risk  (01/10/2024)  Social Connections: Moderately Isolated (02/29/2024)  Tobacco Use: Medium Risk (02/28/2024)    Readmission Risk Interventions    02/29/2024   11:10 AM  Readmission Risk Prevention Plan  Transportation Screening Complete  PCP or Specialist Appt within 3-5 Days Complete  HRI or Home Care Consult Complete  Social Work Consult for Recovery Care Planning/Counseling Complete  Palliative Care Screening Complete  Medication Review Oceanographer) Complete

## 2024-03-08 NOTE — Progress Notes (Signed)
   03/08/24 2348  BiPAP/CPAP/SIPAP  BiPAP/CPAP/SIPAP Pt Type Adult  BiPAP/CPAP/SIPAP V60  Mask Type Full face mask  Dentures removed? Not applicable  Mask Size Small  Set Rate 12 breaths/min  Respiratory Rate 17 breaths/min  IPAP 8 cmH20  EPAP 5 cmH2O  FiO2 (%) 40 %  Minute Ventilation 9.8  Leak 6  Peak Inspiratory Pressure (PIP) 8  Tidal Volume (Vt) 588  Patient Home Machine No  Patient Home Mask No  Patient Home Tubing No  Auto Titrate No  Press High Alarm 35 cmH2O  Press Low Alarm 5 cmH2O  Device Plugged into RED Power Outlet Yes

## 2024-03-09 ENCOUNTER — Encounter: Payer: Self-pay | Admitting: *Deleted

## 2024-03-09 ENCOUNTER — Telehealth: Payer: Self-pay | Admitting: Pulmonary Disease

## 2024-03-09 ENCOUNTER — Other Ambulatory Visit (HOSPITAL_COMMUNITY): Payer: Self-pay

## 2024-03-09 ENCOUNTER — Encounter: Payer: Self-pay | Admitting: Hematology & Oncology

## 2024-03-09 DIAGNOSIS — J189 Pneumonia, unspecified organism: Secondary | ICD-10-CM | POA: Diagnosis not present

## 2024-03-09 DIAGNOSIS — R918 Other nonspecific abnormal finding of lung field: Secondary | ICD-10-CM | POA: Diagnosis not present

## 2024-03-09 DIAGNOSIS — J9601 Acute respiratory failure with hypoxia: Secondary | ICD-10-CM | POA: Diagnosis not present

## 2024-03-09 DIAGNOSIS — C3491 Malignant neoplasm of unspecified part of right bronchus or lung: Secondary | ICD-10-CM | POA: Diagnosis not present

## 2024-03-09 DIAGNOSIS — E860 Dehydration: Secondary | ICD-10-CM | POA: Diagnosis not present

## 2024-03-09 LAB — COMPREHENSIVE METABOLIC PANEL WITH GFR
ALT: 366 U/L — ABNORMAL HIGH (ref 0–44)
AST: 110 U/L — ABNORMAL HIGH (ref 15–41)
Albumin: 2 g/dL — ABNORMAL LOW (ref 3.5–5.0)
Alkaline Phosphatase: 165 U/L — ABNORMAL HIGH (ref 38–126)
Anion gap: 8 (ref 5–15)
BUN: 37 mg/dL — ABNORMAL HIGH (ref 8–23)
CO2: 26 mmol/L (ref 22–32)
Calcium: 7.8 mg/dL — ABNORMAL LOW (ref 8.9–10.3)
Chloride: 109 mmol/L (ref 98–111)
Creatinine, Ser: 0.89 mg/dL (ref 0.44–1.00)
GFR, Estimated: >60 mL/min (ref >60)
Glucose, Bld: 114 mg/dL — ABNORMAL HIGH (ref 70–99)
Potassium: 3 mmol/L — ABNORMAL LOW (ref 3.5–5.1)
Sodium: 143 mmol/L (ref 135–145)
Total Bilirubin: 0.5 mg/dL (ref 0.0–1.2)
Total Protein: 5.6 g/dL — ABNORMAL LOW (ref 6.5–8.1)

## 2024-03-09 LAB — BPAM RBC
Blood Product Expiration Date: 202507292359
Blood Product Expiration Date: 202507292359
ISSUE DATE / TIME: 202507020844
ISSUE DATE / TIME: 202507021228
Unit Type and Rh: 6200
Unit Type and Rh: 6200

## 2024-03-09 LAB — FUNGITELL BETA-D-GLUCAN
Fungitell Value:: 378 pg/mL
Result Name:: POSITIVE — AB

## 2024-03-09 LAB — CBC WITH DIFFERENTIAL/PLATELET
Abs Immature Granulocytes: 2.35 10*3/uL — ABNORMAL HIGH (ref 0.00–0.07)
Basophils Absolute: 0 10*3/uL (ref 0.0–0.1)
Basophils Relative: 0 %
Eosinophils Absolute: 0 10*3/uL (ref 0.0–0.5)
Eosinophils Relative: 0 %
HCT: 35.8 % — ABNORMAL LOW (ref 36.0–46.0)
Hemoglobin: 11.2 g/dL — ABNORMAL LOW (ref 12.0–15.0)
Immature Granulocytes: 21 %
Lymphocytes Relative: 5 %
Lymphs Abs: 0.6 10*3/uL — ABNORMAL LOW (ref 0.7–4.0)
MCH: 28.4 pg (ref 26.0–34.0)
MCHC: 31.3 g/dL (ref 30.0–36.0)
MCV: 90.9 fL (ref 80.0–100.0)
Monocytes Absolute: 0.7 10*3/uL (ref 0.1–1.0)
Monocytes Relative: 6 %
Neutro Abs: 7.5 10*3/uL (ref 1.7–7.7)
Neutrophils Relative %: 68 %
Platelets: 376 10*3/uL (ref 150–400)
RBC: 3.94 MIL/uL (ref 3.87–5.11)
RDW: 16.5 % — ABNORMAL HIGH (ref 11.5–15.5)
WBC: 11.2 10*3/uL — ABNORMAL HIGH (ref 4.0–10.5)
nRBC: 1.1 % — ABNORMAL HIGH (ref 0.0–0.2)

## 2024-03-09 LAB — TYPE AND SCREEN
ABO/RH(D): A POS
Antibody Screen: NEGATIVE
Unit division: 0
Unit division: 0

## 2024-03-09 LAB — MAGNESIUM: Magnesium: 2.2 mg/dL (ref 1.7–2.4)

## 2024-03-09 MED ORDER — SENNOSIDES-DOCUSATE SODIUM 8.6-50 MG PO TABS: 1.0000 | ORAL_TABLET | Freq: Two times a day (BID) | ORAL | Status: DC

## 2024-03-09 MED ORDER — SULFAMETHOXAZOLE-TRIMETHOPRIM 800-160 MG PO TABS
1.0000 | ORAL_TABLET | ORAL | Status: DC
  Administered 2024-03-10: 1 via ORAL
  Filled 2024-03-09: qty 1

## 2024-03-09 MED ORDER — PREDNISONE 20 MG PO TABS
40.0000 mg | ORAL_TABLET | Freq: Every day | ORAL | Status: DC
  Administered 2024-03-10: 40 mg via ORAL
  Filled 2024-03-09: qty 2

## 2024-03-09 MED ORDER — SULFAMETHOXAZOLE-TRIMETHOPRIM 800-160 MG PO TABS
1.0000 | ORAL_TABLET | ORAL | 1 refills | Status: DC
  Filled 2024-03-09: qty 12, 28d supply, fill #0

## 2024-03-09 MED ORDER — FLUTICASONE PROPIONATE 50 MCG/ACT NA SUSP
2.0000 | Freq: Every day | NASAL | 0 refills | Status: AC
  Filled 2024-03-09: qty 16, 30d supply, fill #0

## 2024-03-09 MED ORDER — IPRATROPIUM-ALBUTEROL 0.5-2.5 (3) MG/3ML IN SOLN
RESPIRATORY_TRACT | 1 refills | Status: AC
  Filled 2024-03-09: qty 180, 15d supply, fill #0

## 2024-03-09 MED ORDER — METOPROLOL TARTRATE 25 MG PO TABS
25.0000 mg | ORAL_TABLET | Freq: Two times a day (BID) | ORAL | 1 refills | Status: DC
  Filled 2024-03-09: qty 60, 30d supply, fill #0
  Filled 2024-04-10: qty 60, 30d supply, fill #1

## 2024-03-09 MED ORDER — PREDNISONE 10 MG PO TABS
ORAL_TABLET | ORAL | 0 refills | Status: DC
Start: 2024-03-10 — End: 2024-04-17
  Filled 2024-03-09: qty 140, 56d supply, fill #0

## 2024-03-09 MED ORDER — ONDANSETRON HCL 4 MG PO TABS
4.0000 mg | ORAL_TABLET | Freq: Four times a day (QID) | ORAL | 0 refills | Status: AC | PRN
Start: 2024-03-09 — End: ?
  Filled 2024-03-09: qty 20, 5d supply, fill #0

## 2024-03-09 MED ORDER — POTASSIUM CHLORIDE CRYS ER 10 MEQ PO TBCR
40.0000 meq | EXTENDED_RELEASE_TABLET | ORAL | Status: AC
  Administered 2024-03-09 (×2): 40 meq via ORAL
  Filled 2024-03-09 (×2): qty 4

## 2024-03-09 MED ORDER — AMOXICILLIN-POT CLAVULANATE 875-125 MG PO TABS
1.0000 | ORAL_TABLET | Freq: Two times a day (BID) | ORAL | 0 refills | Status: AC
  Filled 2024-03-09: qty 6, 3d supply, fill #0

## 2024-03-09 MED ORDER — OMEPRAZOLE 20 MG PO CPDR
40.0000 mg | DELAYED_RELEASE_CAPSULE | Freq: Every day | ORAL | 1 refills | Status: AC
  Filled 2024-03-09: qty 60, 30d supply, fill #0

## 2024-03-09 NOTE — Progress Notes (Signed)
 SATURATION QUALIFICATIONS: (This note is used to comply with regulatory documentation for home oxygen)  Patient Saturations on Room Air at Rest = 95%  Patient Saturations on Room Air while Ambulating = 90%  Please briefly explain why patient needs home oxygen: Pt does not need home O2.

## 2024-03-09 NOTE — Progress Notes (Signed)
 Occupational Therapy Treatment Patient Details Name: Regina Young MRN: [id number] DOB: 1956-01-20 Today's Date: 03/09/2024   History of present illness 68 yo female presents to therapy following hospital admission on 02/28/2024 due to elevated tempeture, SOB and AMS. Pt found to have acute respiratory failure secondary to CAP. 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   OT comments  Patient seen for skilled OT session this afternoon. Significant improvement in activity tolerance now off O2 on RA and sats remained >94% with ambulation in room and bathroom. See current status for detailed VSR to full toileting, bathing and standing sinkside for grooming. OT progressed light UE HEP and reinforced energy conservation strategies with breathing integration with + teach back and handouts. OT will continue to follow in acute setting with recommendation for [facility] services, family assist and support upon discharge.       If plan is discharge home, recommend the following:  A little help with walking and/or transfers;A little help with bathing/dressing/bathroom;Assistance with cooking/housework;Direct supervision/assist for medications management;Direct supervision/assist for financial management;Assist for transportation;Help with stairs or ramp for entrance   Equipment Recommendations  None recommended by OT       Precautions / Restrictions Precautions Precautions: Fall Recall of Precautions/Restrictions: Intact Precaution/Restrictions Comments: Mon HR and O2 Restrictions Weight Bearing Restrictions Per Provider Order: No       Mobility Bed Mobility               General bed mobility comments: pt in recliner and remained at end of session    Transfers Overall transfer level: Needs assistance Equipment used: Rolling walker (2 wheels) Transfers: Sit to/from Stand, Bed to  chair/wheelchair/BSC Sit to Stand: Supervision     Step pivot transfers: Supervision     General transfer comment: amb in room and bathroom with RW with supervision only     Balance Overall balance assessment: Needs assistance Sitting-balance support: No upper extremity supported Sitting balance-Leahy Scale: Good     Standing balance support: Bilateral upper extremity supported, No upper extremity supported Standing balance-Leahy Scale: Fair Standing balance comment: RW for ambulation but could stand without support sink side for hand washing                           ADL either performed or assessed with clinical judgement   ADL Overall ADL's : Needs assistance/impaired Eating/Feeding: Independent;Sitting   Grooming: Wash/dry hands;Wash/dry face;Standing;Modified independent   Upper Body Bathing: Modified independent   Lower Body Bathing: Supervison/ safety;Sit to/from stand   Upper Body Dressing : Modified independent   Lower Body Dressing: Supervision/safety;Sit to/from stand   Toilet Transfer: Supervision/safety;Rolling walker (2 wheels)   Toileting- Clothing Manipulation and Hygiene: Supervision/safety;Sitting/lateral lean   Tub/ Engineer, structural: Supervision/safety;Rolling walker (2 wheels)   Functional mobility during ADLs: Set up;Supervision/safety;Rolling walker (2 wheels) General ADL Comments: off O2 on RA see below, educated on ECT's and progressed standing tolerance    Extremity/Trunk Assessment Upper Extremity Assessment Upper Extremity Assessment: Generalized weakness;Right hand dominant   Lower Extremity Assessment Lower Extremity Assessment: Generalized weakness        Vision   Vision Assessment?: No apparent visual deficits;Wears glasses for reading;Wears glasses for driving         Communication Communication Communication: No apparent difficulties   Cognition Arousal: Alert Behavior During Therapy: WFL for tasks  assessed/performed Cognition: No apparent  impairments                               Following commands: Intact        Cueing   Cueing Techniques: Verbal cues  Exercises Exercises: Other exercises (issued and trained in foam grasp ball training 25 x 2 sets with rec for TID)       General Comments SpO2 on RA 94%-96%    Pertinent Vitals/ Pain       Pain Assessment Pain Assessment: No/denies pain   Frequency  Min 2X/week        Progress Toward Goals  OT Goals(current goals can now be found in the care plan section)  Progress towards OT goals: Progressing toward goals  Acute Rehab OT Goals Patient Stated Goal: to go home soon OT Goal Formulation: With patient/family Time For Goal Achievement: 03/15/24 Potential to Achieve Goals: Good ADL Goals Pt Will Perform Lower Body Bathing: with modified independence;sit to/from stand Pt Will Perform Lower Body Dressing: with supervision;sit to/from stand Pt Will Transfer to Toilet: with supervision;ambulating;regular height toilet;grab bars Pt Will Perform Tub/Shower Transfer: Shower transfer;ambulating;shower seat;with contact guard assist Pt/caregiver will Perform Home Exercise Program: Both right and left upper extremity;Increased strength;With written HEP provided;Independently Additional ADL Goal #1: Patient will teach back and integrate 3/4 ECTs for ADL's and mobility with min cues  Plan         AM-PAC OT 6 Clicks Daily Activity     Outcome Measure   Help from another person eating meals?: None Help from another person taking care of personal grooming?: A Little Help from another person toileting, which includes using toliet, bedpan, or urinal?: A Little Help from another person bathing (including washing, rinsing, drying)?: A Little Help from another person to put on and taking off regular upper body clothing?: None Help from another person to put on and taking off regular lower body clothing?: A  Little 6 Click Score: 20    End of Session Equipment Utilized During Treatment: Gait belt;Rolling walker (2 wheels)  OT Visit Diagnosis: Unsteadiness on feet (R26.81);Muscle weakness (generalized) (M62.81)   Activity Tolerance Patient tolerated treatment well   Patient Left in chair;with chair alarm set;with call bell/phone within reach   Nurse Communication Mobility status;Other (comment) (BM entered in Flowsheets)        Time: 8775-8747 OT Time Calculation (min): 28 min  Charges: OT General Charges $OT Visit: 1 Visit OT Treatments $Self Care/Home Management : 8-22 mins $Therapeutic Exercise: 8-22 mins   Braun Rocca OT/L Acute Rehabilitation Department  [phone]  03/09/2024, 2:40 PM

## 2024-03-09 NOTE — Progress Notes (Signed)
 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 Patient placed on empiric IV antibiotics. Patient also seen in consultation by hematology/oncology.  - Patient with worsening respiratory status, noted to have fevers and desaturation, CT angiogram chest done negative for PE, due to worsening hypoxemia PCCM consulted as patient had increased O2 requirements.  Patient subsequently placed on BiPAP.  PCCM following.   Assessment & Plan:   Principal Problem:   Acute respiratory failure with hypoxia (HCC) Active Problems:   CAP (community acquired pneumonia)   Hyperlipidemia   Lung cancer, primary, with metastasis from lung to other site, right (HCC)   Dehydration   DVT (deep venous thrombosis) (HCC)   Transaminitis   Hyponatremia   Normocytic anemia  #1 acute hypoxic respiratory failure secondary to community-acquired pneumonia -Patient presented with fevers with temps as high as 102.9, nausea headache, right lower extremity pain. - Chest x-ray done with bilateral upper lobe opacities concerning for infiltrate versus edema. - Blood cultures obtained with no growth to date. - SARS coronavirus 2 PCR negative. - Influenza A and B by PCR negative. - RSV by PCR negative. - Respiratory viral panel negative. - Patient initially placed on IV Rocephin  and IV azithromycin  however due to increased O2 requirements and no significant improvement CT angiogram chest was done which was negative for PE. - Patient seen in consultation by PCCM and antibiotics broadened to vancomycin  and cefepime . - Patient currently on IV cefepime  and also on hydrocortisone to  help decrease inflammation.   - Continue gentle spirometry.   - Fungitell ordered and pending.   - Patient on BiPAP nightly.   - Fever curve trended down.   -PCCM recommending slow steroid taper on discharge with transition to Augmentin to complete a 7-day course of treatment. - PCCM following and appreciate input and recommendations. - Outpatient follow-up with pulmonary.  2.  Dehydration -Improved with IV fluids.  3.  Community-acquired pneumonia -Presentation chest x-ray bilateral upper lobe opacities. - Patient noted to have a worsening hypoxemia, CT angiogram chest done negative for PE however did show new multifocal and confluent areas of smooth interlobular septal thickening with superimposed groundglass opacities predominantly throughout bilateral upper lobes with findings nonspecific may be related to pulmonary edema, ARDS, infection or other inflammatory processes. - Was on IV Rocephin  and azithromycin  due to hypoxemia broadened to IV vancomycin  and cefepime  and currently just on IV cefepime . -Continue IV cefepime  and transition to Augmentin on discharge to complete a 7day course of antibiotic treatment. - See #1.  4.  Hyponatremia -Fall secondary to hypovolemic hyponatremia secondary to dehydration in the setting of acute infection. - Resolved with hydration.  5.  Hypokalemia - Potassium at 3.0 this morning. -Magnesium  at 2.2. - K-Dur 40 mEq p.o. every 4 hours x 2 doses..  6.  Transaminitis -Felt secondary to acute infection. - LFTs trending down. - Acute hepatitis panel negative. - CMV antibody IgG elevated at 9.50. - CMV IgM negative. - EBV IgM negative. - EBV IgG elevated > 600. - CT abdomen and pelvis done on day of admission with scattered rounded hypodensities throughout the liver favored a cyst, gallbladder and bile ducts within normal limits. - Right upper  quadrant ultrasound done with no gallstones or ductal dilatation.  Hepatic cysts. - Transaminitis  trending down. - Outpatient follow-up.  7.  History of DVT - Continue Eliquis .  8.  Constipation -Resolved. - Continue MiraLAX  as needed.  9.  History of lung cancer with metastasis -Continue home regimen of MS Contin , oxycodone  as needed for breakthrough pain. 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 - Hematology/oncology following.  10.  Anemia -Patient with no overt bleeding. - Per hematology/oncology patient with low erythropoietin level of 20. - Hemoglobin noted at 7.7 on 03/08/2024, 2 units PRBCs ordered.  Hematology/oncology with hemoglobin currently at 11.2.    DVT prophylaxis: Eliquis  Code Status: Full Family Communication: Updated patient and husband at bedside. Disposition: Home with home health when clinically improved and cleared by pulmonary and oncology hopefully home in the next 24 hours.  Status is: Inpatient Remains inpatient appropriate because: Severity of illness   Consultants:  Oncology: Dr. Timmy 02/29/2024 PCCM: Dr. Geronimo 03/04/2024  Procedures:  Transfusion 2 units PRBCs 03/08/2024 CT head without contrast 02/28/2024 CT abdomen and pelvis 02/28/2024 Chest x-ray 02/28/2024, 03/01/2024 CT chest 03/04/2024 CT angiogram chest 03/02/2024 Right upper quadrant ultrasound 03/06/2024  Significant Hospital Events:  02/18/2024: Office visit with Dr. Timmy plan was to taper of Decadron .  Plan was to get Port-A-Cath and start patient on carboplatin/Alimta/Keytruda.  He noticed that ECOG is 1 Eliquis  was being continued.  He noticed that she had completed radiotherapy to the sacral metastasis   02/28/2024 - admit: : Blood cultures negative   02/29/2024: Chest x-ray with upper lobe opacities.  Requiring 3 L nasal cannula.  Continued on Rocephin  and azithromycin .   03/01/24: Continued on 3 L nasal cannula and antibiotics.  Eliquis  continued.  She was seen by oncology consult Dr.  Timmy.  Right radicular pain noticed and Dr. Timmy increased her gabapentin .             - Carbo/Alimta/Keytruda -- tart cycle #1 on 03/01/2024  - GOING TO START but did ot happen due to ilness   03/02/2024: Pulm embolism ruled out.   03/03/2024: Dr. Timmy conserver temperature spike.  Concerning of desaturations continued fevers documented.  CAP  Antibiotics continued.  Respiratory virus panel multiplex negative   03/04/2024: PCCM consulted because of worsening hypoxemia now requiring 6 L.  Record review shows making fevers throughout the course of hospitalization.             - MRSA PCR negative              - Urine strep -3   03/06/2024: On BiPAP at night   Antimicrobials:  Anti-infectives (From admission, onward)    Start     Dose/Rate Route Frequency Ordered Stop   03/10/24 0900  sulfamethoxazole-trimethoprim (BACTRIM DS) 800-160 MG per tablet 1 tablet        1 tablet Oral Once per day on Monday Wednesday Friday 03/09/24 0820     03/05/24 1700  vancomycin  (VANCOREADY) IVPB 750 mg/150 mL  Status:  Discontinued        750 mg 150 mL/hr over 60 Minutes Intravenous Every 24 hours 03/04/24 1452 03/05/24 1106   03/04/24 1600  ceFEPIme  (MAXIPIME ) 2 g in sodium chloride  0.9 % 100 mL IVPB        2 g 200 mL/hr over 30 Minutes Intravenous Every 12 hours 03/04/24 1449     03/04/24 1600  vancomycin  (VANCOCIN ) IVPB 1000 mg/200  mL premix        1,000 mg 200 mL/hr over 60 Minutes Intravenous  Once 03/04/24 1450 03/04/24 2315   02/29/24 1800  cefTRIAXone  (ROCEPHIN ) 2 g in sodium chloride  0.9 % 100 mL IVPB  Status:  Discontinued        2 g 200 mL/hr over 30 Minutes Intravenous Every 24 hours 02/29/24 0822 03/04/24 1440   02/28/24 1800  azithromycin  (ZITHROMAX ) 500 mg in sodium chloride  0.9 % 250 mL IVPB  Status:  Discontinued        500 mg 250 mL/hr over 60 Minutes Intravenous Every 24 hours 02/28/24 1656 03/04/24 1440   02/28/24 1800  cefTRIAXone  (ROCEPHIN ) 1 g in sodium chloride  0.9 % 100 mL  IVPB  Status:  Discontinued        1 g 200 mL/hr over 30 Minutes Intravenous Every 24 hours 02/28/24 1656 02/29/24 0822   02/28/24 1115  ceFEPIme  (MAXIPIME ) 2 g in sodium chloride  0.9 % 100 mL IVPB        2 g 200 mL/hr over 30 Minutes Intravenous  Once 02/28/24 1103 02/28/24 1238   02/28/24 1115  metroNIDAZOLE  (FLAGYL ) IVPB 500 mg        500 mg 100 mL/hr over 60 Minutes Intravenous  Once 02/28/24 1103 02/28/24 1238   02/28/24 1115  vancomycin  (VANCOCIN ) IVPB 1000 mg/200 mL premix        1,000 mg 200 mL/hr over 60 Minutes Intravenous  Once 02/28/24 1103 02/28/24 1438         Subjective: Patient sitting up in chair.  Stated ambulated in hallway with RN.  Feels shortness of breath is improving.  States O2 sat stayed above 90% with ambulation.  Denies any chest pain.  No abdominal pain.  Still with poor oral intake.  Feels BiPAP has helped her at bedtime.  Husband at bedside.    Objective: Vitals:   03/09/24 0434 03/09/24 0906 03/09/24 0926 03/09/24 0945  BP: 138/81     Pulse: 83     Resp: 16     Temp: (!) 97.4 F (36.3 C)     TempSrc: Oral     SpO2: 96% 98% 98% 98%  Weight:      Height:        Intake/Output Summary (Last 24 hours) at 03/09/2024 1126 Last data filed at 03/08/2024 1615 Gross per 24 hour  Intake 1076.85 ml  Output --  Net 1076.85 ml   Filed Weights   02/28/24 2000  Weight: 48.6 kg    Examination:  General exam: NAD. Respiratory system: Improved scattered coarse breath sounds.  No wheezing.  Fair air movement.  Speaking in full sentences.  No use of accessory muscles of respiration.   Cardiovascular system: Regular rate rhythm no murmurs rubs or gallops.  No JVD.  No lower extremity edema. Gastrointestinal system: Abdomen is soft, nontender, nondistended, positive bowel sounds.  No rebound.  No guarding.  Central nervous system: Alert and oriented. No focal neurological deficits. Extremities: Symmetric 5 x 5 power. Skin: No rashes, lesions or  ulcers Psychiatry: Judgement and insight appear normal. Mood & affect appropriate.     Data Reviewed: I have personally reviewed following labs and imaging studies  CBC: Recent Labs  Lab 03/05/24 0347 03/06/24 0318 03/07/24 0420 03/08/24 0315 03/09/24 0412  WBC 5.7 5.5 8.4 8.9 11.2*  NEUTROABS  --   --  7.3 6.8 7.5  HGB 8.2* 8.3* 7.9* 7.7* 11.2*  HCT 26.6* 27.3* 25.8* 25.5* 35.8*  MCV  93.0 95.1 93.8 95.1 90.9  PLT 293 348 368 377 376    Basic Metabolic Panel: Recent Labs  Lab 03/05/24 0347 03/06/24 0318 03/07/24 0420 03/08/24 0315 03/09/24 0412  NA 138 140 138 143 143  K 4.0 3.8 3.4* 4.0 3.0*  CL 104 106 106 112* 109  CO2 24 23 23 23 26   GLUCOSE 102* 133* 114* 110* 114*  BUN 19 27* 32* 36* 37*  CREATININE 0.48 0.54 0.69 0.64 0.89  CALCIUM  8.0* 8.4* 8.0* 8.1* 7.8*  MG  --   --   --   --  2.2    GFR: Estimated Creatinine Clearance: 48.3 mL/min (by C-G formula based on SCr of 0.89 mg/dL).  Liver Function Tests: Recent Labs  Lab 03/05/24 0347 03/06/24 0318 03/07/24 0420 03/08/24 0315 03/09/24 0412  AST 239* 297* 444* 204* 110*  ALT 283* 364* 520* 447* 366*  ALKPHOS 157* 206* 193* 177* 165*  BILITOT 0.5 0.4 0.5 0.4 0.5  PROT 5.8* 5.9* 5.7* 5.3* 5.6*  ALBUMIN 1.9* 1.9* 1.8* 1.8* 2.0*    CBG: No results for input(s): GLUCAP in the last 168 hours.   Recent Results (from the past 240 hours)  Blood Culture (routine x 2)     Status: None   Collection Time: 02/28/24 11:32 AM   Specimen: BLOOD  Result Value Ref Range Status   Specimen Description   Final    BLOOD BLOOD RIGHT ARM Performed at [facility], [address], [place], [place] [address]    Special Requests   Final    BOTTLES DRAWN AEROBIC AND ANAEROBIC Blood Culture adequate volume Performed at [facility], [address] [place], [place] [address]    Culture   Final    NO GROWTH 5 DAYS Performed at [facility], [address],  [place], [place] [address]    Report Status 03/04/2024 FINAL  Final  Resp panel by RT-PCR (RSV, Flu A&B, Covid) Anterior Nasal Swab     Status: None   Collection Time: 02/28/24 12:07 PM   Specimen: Anterior Nasal Swab  Result Value Ref Range Status   SARS Coronavirus 2 by RT PCR NEGATIVE NEGATIVE Final    Comment: (NOTE) SARS-CoV-2 target nucleic acids are NOT DETECTED.  The SARS-CoV-2 RNA is generally detectable in upper respiratory specimens during the acute phase of infection. The lowest concentration of SARS-CoV-2 viral copies this assay can detect is 138 copies/mL. A negative result does not preclude SARS-Cov-2 infection and should not be used as the sole basis for treatment or other patient management decisions. A negative result may occur with  improper specimen collection/handling, submission of specimen other than nasopharyngeal swab, presence of viral mutation(s) within the areas targeted by this assay, and inadequate number of viral copies(<138 copies/mL). A negative result must be combined with clinical observations, patient history, and epidemiological information. The expected result is Negative.  Fact Sheet for Patients:  BloggerCourse.com  Fact Sheet for Healthcare Providers:  SeriousBroker.it  This test is no t yet approved or cleared by the [place]  FDA and  has been authorized for detection and/or diagnosis of SARS-CoV-2 by FDA under an Emergency Use Authorization (EUA). This EUA will remain  in effect (meaning this test can be used) for the duration of the COVID-19 declaration under Section 564(b)(1) of the Act, 21 U.S.C.section 360bbb-3(b)(1), unless the authorization is terminated  or revoked sooner.       Influenza A by PCR NEGATIVE NEGATIVE Final  Influenza B by PCR NEGATIVE NEGATIVE Final    Comment: (NOTE) The Xpert Xpress SARS-CoV-2/FLU/RSV plus assay is intended as an aid in the diagnosis of  influenza from Nasopharyngeal swab specimens and should not be used as a sole basis for treatment. Nasal washings and aspirates are unacceptable for Xpert Xpress SARS-CoV-2/FLU/RSV testing.  Fact Sheet for Patients: BloggerCourse.com  Fact Sheet for Healthcare Providers: SeriousBroker.it  This test is not yet approved or cleared by the [place]  FDA and has been authorized for detection and/or diagnosis of SARS-CoV-2 by FDA under an Emergency Use Authorization (EUA). This EUA will remain in effect (meaning this test can be used) for the duration of the COVID-19 declaration under Section 564(b)(1) of the Act, 21 U.S.C. section 360bbb-3(b)(1), unless the authorization is terminated or revoked.     Resp Syncytial Virus by PCR NEGATIVE NEGATIVE Final    Comment: (NOTE) Fact Sheet for Patients: BloggerCourse.com  Fact Sheet for Healthcare Providers: SeriousBroker.it  This test is not yet approved or cleared by the [place]  FDA and has been authorized for detection and/or diagnosis of SARS-CoV-2 by FDA under an Emergency Use Authorization (EUA). This EUA will remain in effect (meaning this test can be used) for the duration of the COVID-19 declaration under Section 564(b)(1) of the Act, 21 U.S.C. section 360bbb-3(b)(1), unless the authorization is terminated or revoked.  Performed at [facility], [address] [place], [place] [address]   Blood Culture (routine x 2)     Status: None   Collection Time: 02/28/24 12:08 PM   Specimen: BLOOD  Result Value Ref Range Status   Specimen Description   Final    BLOOD BLOOD LEFT ARM Performed at [facility], [address] [place], [place] [address]    Special Requests   Final    BOTTLES DRAWN AEROBIC ONLY Blood Culture adequate volume Performed at [facility], [address], [place], [place] [address]    Culture   Final    NO GROWTH 5 DAYS Performed at [facility], [address] [place], [place] [address]    Report Status 03/04/2024 FINAL  Final  Urine Culture (for pregnant, neutropenic or urologic patients or patients with an indwelling urinary catheter)     Status: None   Collection Time: 02/29/24  8:30 AM   Specimen: Urine, Catheterized  Result Value Ref Range Status   Specimen Description   Final    URINE, CATHETERIZED Performed at [facility], [address], [place], [place] [address]    Special Requests   Final    NONE Performed at [facility], [address], [place], [place] [address]    Culture   Final    NO GROWTH Performed at [facility], [address], [place], [place] [address]    Report Status 03/01/2024 FINAL  Final  Respiratory (~20 pathogens) panel by PCR     Status: None   Collection Time: 03/03/24  6:42 PM   Specimen: Nasopharyngeal Swab; Respiratory  Result Value Ref Range Status   Adenovirus NOT DETECTED NOT DETECTED Final   Coronavirus 229E NOT DETECTED NOT DETECTED Final    Comment: (NOTE) The Coronavirus on the Respiratory Panel, DOES NOT test for the novel  Coronavirus (2019 nCoV)    Coronavirus HKU1 NOT DETECTED NOT DETECTED Final   Coronavirus NL63 NOT DETECTED NOT DETECTED Final   Coronavirus OC43 NOT DETECTED NOT DETECTED Final   Metapneumovirus  NOT DETECTED NOT DETECTED Final   Rhinovirus / Enterovirus NOT DETECTED NOT DETECTED Final   Influenza A NOT DETECTED NOT DETECTED Final   Influenza B NOT DETECTED NOT DETECTED Final   Parainfluenza Virus 1 NOT DETECTED NOT DETECTED Final   Parainfluenza Virus 2 NOT DETECTED NOT DETECTED Final   Parainfluenza Virus 3 NOT DETECTED NOT DETECTED Final   Parainfluenza Virus 4 NOT DETECTED NOT DETECTED Final   Respiratory Syncytial Virus NOT DETECTED NOT DETECTED Final   Bordetella pertussis NOT DETECTED NOT  DETECTED Final   Bordetella Parapertussis NOT DETECTED NOT DETECTED Final   Chlamydophila pneumoniae NOT DETECTED NOT DETECTED Final   Mycoplasma pneumoniae NOT DETECTED NOT DETECTED Final    Comment: Performed at [facility], [address], [place], [place] [address]  MRSA Next Gen by PCR, Nasal     Status: None   Collection Time: 03/04/24  2:38 PM   Specimen: Nasal Mucosa; Nasal Swab  Result Value Ref Range Status   MRSA by PCR Next Gen NOT DETECTED NOT DETECTED Final    Comment: (NOTE) The GeneXpert MRSA Assay (FDA approved for NASAL specimens only), is one component of a comprehensive MRSA colonization surveillance program. It is not intended to diagnose MRSA infection nor to guide or monitor treatment for MRSA infections. Test performance is not FDA approved in patients less than 21 years old. Performed at [facility], [address], [place], [place] [address]          Radiology Studies: No results found.       Scheduled Meds:  apixaban   5 mg Oral BID   calcium  citrate  200 mg of elemental calcium  Oral Daily   Chlorhexidine  Gluconate Cloth  6 each Topical Daily   cholecalciferol   2,000 Units Oral Q breakfast   cyanocobalamin   1,000 mcg Oral Daily   feeding supplement  1 Container Oral TID BM   fluticasone  2 spray Each Nare Daily   gabapentin   400 mg Oral TID   ipratropium-albuterol   3 mL Nebulization TID   metoprolol tartrate  25 mg Oral BID   morphine   15 mg Oral Q12H   pantoprazole   40 mg Oral QAC breakfast   potassium chloride   40 mEq Oral Q4H   [START ON 03/10/2024] predniSONE  40 mg Oral Q breakfast   senna-docusate  1 tablet Oral BID   sodium chloride  flush  10-40 mL Intracatheter Q12H   [START ON 03/10/2024] sulfamethoxazole-trimethoprim  1 tablet Oral Once per day on Monday Wednesday Friday   Continuous Infusions:  ceFEPime  (MAXIPIME ) IV 2 g (03/09/24 0321)     LOS: 10 days    Time spent: 40 minutes    Toribio Hummer, MD [facility]   To contact the attending provider between 7A-7P or the covering provider during after hours 7P-7A, please log into the web site [email or web address] and access using universal [place] password for that web site. If you do not have the password, please call the hospital operator.  03/09/2024, 11:26 AM

## 2024-03-09 NOTE — Progress Notes (Signed)
 Ms. Regina Young looks pretty good.  She feels better.  I think the BiPAP does seem to help her.  She has improving LFTs.  She got transfused yesterday.  Her white cell count is 11.2.  Hemoglobin 11.2.  Platelet count 376,000.  Her SGPT 366 SGOT 110.  Alkaline phosphatase 165.  Albumin is 2.0.  I think she is out of bed.  Her appetite was not all that great yesterday.  She really needs to improve her nutritional intake in my opinion.  She seems to be breathing better.  She has had no fever.  She has had no cough.  There is been no nausea or vomiting.  She has had no change in bowel or bladder habits.  Her right leg and foot seem to feel a little bit better.  Hopefully, the gabapentin  seems to be working.  I would like to also hope that the radiation that she took is starting to work.  I would like to hope that she will be able to go home soon.  From my point of view, she can go home at any time.  I know she would like to go home.  I do not know if she needs any oxygen at home.  Her vital signs show temperature of 97.4.  Pulse 83.  Blood pressure 138/81.  Her lungs sound great bilaterally.  Cardiac exam shows regular rate and rhythm.  She has no murmurs, rubs or bruits.  Abdomen is soft.  Bowel sounds are present.  She has no fluid wave.  There is no palpable liver or spleen tip.  Extremities shows no clubbing, cyanosis or edema.  Neurological exam is nonfocal.  Ms. Regina Young came in with pneumonia.  She seems to be improving.  She had a transfusion which has helped.  Again, I am not sure when she will be discharged.  Again, from my point of view, I do not see a problem with her being discharged.  I know that she has had a great care from everybody on [address].  The staff have done a tremendous job with her.  There compassion and dedication clearly are evident.     Jeralyn Crease, MD  Psalm 91:1-2

## 2024-03-09 NOTE — Progress Notes (Signed)
 TOC meds in a secure bag delivered to inpatient pharmacy

## 2024-03-09 NOTE — Telephone Encounter (Signed)
 Please make follow-up in pulmonary clinic in 4 to 8 weeks with any provider [doc or APP]

## 2024-03-09 NOTE — Progress Notes (Addendum)
   03/09/24 1920  BiPAP/CPAP/SIPAP  BiPAP/CPAP/SIPAP Pt Type Adult  BiPAP/CPAP/SIPAP V60  Reason BIPAP/CPAP not in use Other(comment) (trial off nocturnal BiPAP due to plan of DC tomorrow to home without PAP therapy.)   Call placed to [facility] to clarify plan. Awaiting further response from MD

## 2024-03-09 NOTE — Progress Notes (Signed)
 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  This in the background of stage I colonic adenocarcinoma in June 2022.  She also has a history of right posterior tibial vein DVT for which she is on Eliquis  5 mg twice daily.  She saw Dr. Maude Crease March 06, 2024 and at that point had completed radiotherapy to the sacral metastasis.  She was also on Decadron .  She was recommended carboplatin, Alimta and Keytruda.  This has not been started as of yet and resume June 2025 ECOG was reported as 1.  She underwent right Port-A-Cath 02/22/24.  Then on 6/23/5 was admitted to the hospital with acute hypoxemic respiratory failure diagnosed as community-acquired pneumonia initially.  She had a high fever of 102 point 9 in the morning of admission associated nausea and headache and had new oxygen requirement of 3 L nasal cannula.  Chest x-ray with new upper lobe opacities.  Flu, COVID and SARS PCR negative.  She is on opioids suggest MS Contin  for her sacral metastasis. 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   reports that she quit smoking about 2 months ago. Her smoking use included cigarettes. She has a 12.5 pack-year smoking history. She has been exposed to tobacco smoke. She has never used smokeless tobacco.  Significant Hospital Events:  02/18/2024: Office visit with Dr. Crease plan was to taper of Decadron .  Plan was to get Port-A-Cath and start patient on carboplatin/Alimta/Keytruda.  He noticed that ECOG is 1 Eliquis  was being continued.  He noticed that she had completed radiotherapy to the sacral metastasis  02/28/2024 - admit: : Blood cultures negative  02/29/2024: Chest x-ray with upper lobe opacities.  Requiring 3 L nasal cannula.  Continued on Rocephin  and azithromycin .  03/01/24: Continued on 3 L nasal cannula and antibiotics.  Eliquis  continued.  She was seen by oncology consult Dr. Crease.  Right radicular pain noticed and Dr. Crease increased her gabapentin .  - Carbo/Alimta/Keytruda -- tart cycle #1 on 03/01/2024  - GOING TO START but did ot happen due to ilness  03/02/2024: Pulm embolism ruled out.  03/03/2024: Dr. Crease conserver temperature spike.  Concerning of desaturations continued fevers documented.  CAP  Antibiotics continued.  Respiratory virus panel multiplex negative  03/04/2024: PCCM consulted because of worsening hypoxemia now requiring 6 L.  Record review shows making fevers throughout the course of hospitalization.  - MRSA PCR negative   - Urine strep -3  03/06/2024: On BiPAP at  night  03/08/2024: Slow improvement in respiratory status.  (PRBC for low hemoglobin  Interim History / Subjective:   Continues to feel better  Objective   Blood pressure 138/81, pulse 83, temperature (!) 97.4 F (36.3 C), temperature source Oral, resp. rate 16, height 5' 2 (1.575 m), weight 48.6  kg, SpO2 96%.    FiO2 (%):  [40 %] 40 %   Intake/Output Summary (Last 24 hours) at 03/09/2024 0758 Last data filed at 03/08/2024 1615 Gross per 24 hour  Intake 1316.85 ml  Output --  Net 1316.85 ml   Filed Weights   02/28/24 2000  Weight: 48.6 kg   mine  General Appearance:  Blood pressure 138/81, pulse 83, temperature (!) 97.4 F (36.3 C), temperature source Oral, resp. rate 16, height 5' 2 (1.575 m), weight 48.6 kg, SpO2 96%. Gen:      No acute distress HEENT:  EOMI, sclera anicteric Neck:     No masses; no thyromegaly Lungs:    Clear to auscultation bilaterally; normal respiratory effort CV:         Regular rate and rhythm; no murmurs Abd:      + bowel sounds; soft, non-tender; no palpable masses, no distension Ext:    No edema; adequate peripheral perfusion Neuro: alert and oriented x 3 Psych: normal mood and affect    Resolved Hospital Problem list     Assessment & Plan:   Acute hypoxemic respiratory failure with upper lobe interstitial infiltrates -new onset this admission and progressively getting worse.  Not on chemotherapy.  Differential diagnose includes HCAP, Boop, radiation pneumonitis.  Lymphangitis carcinomatosis in highly unlikely but possible would be acute hypersensitive pneumonitis since she does not have any exposures.  Pneumocystis also considered but unlikely  Pneumonia Dense consolidation in the right lower lobe and inflammatory changes in the upper lobes, likely exacerbated by recent radiation therapy. Symptoms have improved with the current treatment regimen. - Continue current antibiotics for hospital-acquired pneumonia - Can switch to prednisone 40 mg/day with slow taper.  Reduce by 10 mg every 2 weeks.  Start Bactrim Monday Wednesday Friday for pneumocystis prophylaxis - Encourage use of incentive spirometer to improve lung function - Consult physical therapy for mobilization and strength building, wean O2 as tolerated -Check need for supplemental  oxygen on discharge - Follow Fungitell - Ensure physical therapy is working with patient.  Encouraged her to use incentive spirometer and flutter valve  Will arrange follow-up in clinic.  PCCM will be available as needed.  Please call with questions.  Lung cancer, history of colon cancer Recent diagnosis of lung cancer with a small nodule in the right lung. Completed 20 sessions of radiation therapy, 10 of which were directed at the lung.  Best practice (daily eval):  According to [facility] Goals of Care:   Full code but no CPR no intubation very appropriate Family Updates: Updated patient at the bedside and sister and husband 03/08/2024  Signature:   Jailyn Leeson MD [facility] See Amion for pager  If no response to pager , please call [phone] until 7pm After 7:00 pm call Elink  [phone] 03/09/2024, 7:58 AM

## 2024-03-09 NOTE — Progress Notes (Signed)
 Patient continues to be admitted, however there is a discharge plan for within the next few days. Spoke to Dr Timmy and he would like patient to be seen in about 2 weeks for follow up, and to start treatment. Message sent to scheduling.   Oncology Nurse Navigator Documentation     03/09/2024    9:00 AM  Oncology Nurse Navigator Flowsheets  Navigator Follow Up Date: 03/21/2024  Navigator Follow Up Reason: Follow-up Appointment;Chemotherapy  Navigator Location [facility]  Navigator Encounter Type Appt/Treatment Plan Review  Patient Visit Type MedOnc  Treatment Phase Active Tx  Barriers/Navigation Needs Coordination of Care;Education  Interventions Coordination of Care  Acuity Level 2-Minimal Needs (1-2 Barriers Identified)  Coordination of Care Appts  Support Groups/Services Friends and Family  Time Spent with Patient 15

## 2024-03-09 NOTE — Telephone Encounter (Signed)
 Spoke to patient and scheduled HFU 04/17/2024 at 10:30. Nothing further needed.

## 2024-03-09 NOTE — TOC Progression Note (Signed)
 Transition of Care [facility]) - Progression Note    Patient Details  Name: Regina Young MRN: [id number] Date of Birth: 16-Jan-1956  [facility]) CM/SW Contact  Mena Simonis, Nathanel, RN Phone Number: 03/09/2024, 2:56 PM  Clinical Narrative: [employer] rep Lynette aware of HHC-HHRN/PT/OT. [employer] rep Jermaine neb machine to deliver to rm prior d/c.      Expected Discharge Plan: Home w Home Health Services Barriers to Discharge: Continued Medical Work up  Expected Discharge Plan and Services   Discharge Planning Services: CM Consult Post Acute Care Choice: Home Health Living arrangements for the past 2 months: Single Family Home                 DME Arranged: Nebulizer machine DME Agency: [employer] Date DME Agency Contacted: 03/09/24 Time DME Agency Contacted: 1455 Representative spoke with at DME Agency: [place]             Social Determinants of Health (SDOH) Interventions SDOH Screenings   Food Insecurity: No Food Insecurity (02/28/2024)  Housing: Low Risk  (02/28/2024)  Transportation Needs: No Transportation Needs (02/29/2024)  Utilities: Not At Risk (02/29/2024)  Depression (PHQ2-9): Low Risk  (01/17/2024)  Financial Resource Strain: Low Risk  (01/10/2024)  Social Connections: Moderately Isolated (02/29/2024)  Tobacco Use: Medium Risk (02/28/2024)    Readmission Risk Interventions    02/29/2024   11:10 AM  Readmission Risk Prevention Plan  Transportation Screening Complete  PCP or Specialist Appt within 3-5 Days Complete  HRI or Home Care Consult Complete  Social Work Consult for Recovery Care Planning/Counseling Complete  Palliative Care Screening Complete  Medication Review Ocean[employer]) Complete

## 2024-03-09 NOTE — Progress Notes (Signed)
 eLink Physician-Brief Progress Note Patient Name: Regina Young DOB: [birth year] MRN: [id number]   Date of Service  03/09/2024  HPI/Events of Note  Acute hypoxemic respiratory failure with upper lobe interstitial infiltrates -new onset this admission and progressively getting worse.  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  eICU Interventions  Hold BiPAP for tonight and reassess in the morning     Intervention Category Intermediate Interventions: Respiratory distress - evaluation and management  Yonah Tangeman 03/09/2024, 8:34 PM

## 2024-03-10 ENCOUNTER — Other Ambulatory Visit: Payer: Self-pay

## 2024-03-10 DIAGNOSIS — J9601 Acute respiratory failure with hypoxia: Secondary | ICD-10-CM | POA: Diagnosis not present

## 2024-03-10 DIAGNOSIS — C786 Secondary malignant neoplasm of retroperitoneum and peritoneum: Secondary | ICD-10-CM

## 2024-03-10 DIAGNOSIS — E860 Dehydration: Secondary | ICD-10-CM | POA: Diagnosis not present

## 2024-03-10 DIAGNOSIS — E785 Hyperlipidemia, unspecified: Secondary | ICD-10-CM | POA: Diagnosis not present

## 2024-03-10 DIAGNOSIS — J189 Pneumonia, unspecified organism: Secondary | ICD-10-CM | POA: Diagnosis not present

## 2024-03-10 LAB — CBC WITH DIFFERENTIAL/PLATELET
Abs Immature Granulocytes: 0.8 K/uL — ABNORMAL HIGH (ref 0.00–0.07)
Basophils Absolute: 0 K/uL (ref 0.0–0.1)
Basophils Relative: 0 %
Eosinophils Absolute: 0 K/uL (ref 0.0–0.5)
Eosinophils Relative: 0 %
HCT: 36.6 % (ref 36.0–46.0)
Hemoglobin: 11.4 g/dL — ABNORMAL LOW (ref 12.0–15.0)
Lymphocytes Relative: 10 %
Lymphs Abs: 0.9 K/uL (ref 0.7–4.0)
MCH: 28.9 pg (ref 26.0–34.0)
MCHC: 31.1 g/dL (ref 30.0–36.0)
MCV: 92.7 fL (ref 80.0–100.0)
Metamyelocytes Relative: 5 %
Monocytes Absolute: 0.3 K/uL (ref 0.1–1.0)
Monocytes Relative: 3 %
Myelocytes: 4 %
Neutro Abs: 7.2 K/uL (ref 1.7–7.7)
Neutrophils Relative %: 78 %
Platelets: 358 K/uL (ref 150–400)
RBC: 3.95 MIL/uL (ref 3.87–5.11)
RDW: 17.1 % — ABNORMAL HIGH (ref 11.5–15.5)
WBC: 9.2 K/uL (ref 4.0–10.5)
nRBC: 2.9 % — ABNORMAL HIGH (ref 0.0–0.2)

## 2024-03-10 LAB — COMPREHENSIVE METABOLIC PANEL WITH GFR
ALT: 285 U/L — ABNORMAL HIGH (ref 0–44)
AST: 64 U/L — ABNORMAL HIGH (ref 15–41)
Albumin: 2.1 g/dL — ABNORMAL LOW (ref 3.5–5.0)
Alkaline Phosphatase: 151 U/L — ABNORMAL HIGH (ref 38–126)
Anion gap: 10 (ref 5–15)
BUN: 34 mg/dL — ABNORMAL HIGH (ref 8–23)
CO2: 24 mmol/L (ref 22–32)
Calcium: 8 mg/dL — ABNORMAL LOW (ref 8.9–10.3)
Chloride: 107 mmol/L (ref 98–111)
Creatinine, Ser: 0.82 mg/dL (ref 0.44–1.00)
GFR, Estimated: >60 mL/min (ref >60)
Glucose, Bld: 86 mg/dL (ref 70–99)
Potassium: 4 mmol/L (ref 3.5–5.1)
Sodium: 141 mmol/L (ref 135–145)
Total Bilirubin: 0.5 mg/dL (ref 0.0–1.2)
Total Protein: 5.2 g/dL — ABNORMAL LOW (ref 6.5–8.1)

## 2024-03-10 MED ORDER — HEPARIN SOD (PORK) LOCK FLUSH 100 UNIT/ML IV SOLN
500.0000 [IU] | INTRAVENOUS | Status: AC | PRN
  Administered 2024-03-10: 500 [IU]
  Filled 2024-03-10: qty 5

## 2024-03-10 MED ORDER — ALBUTEROL SULFATE (2.5 MG/3ML) 0.083% IN NEBU: 2.5000 mg | INHALATION_SOLUTION | Freq: Four times a day (QID) | RESPIRATORY_TRACT | Status: DC | PRN

## 2024-03-10 MED ORDER — IPRATROPIUM-ALBUTEROL 0.5-2.5 (3) MG/3ML IN SOLN: 3.0000 mL | Freq: Two times a day (BID) | RESPIRATORY_TRACT | Status: DC

## 2024-03-10 NOTE — Progress Notes (Signed)
 TOC meds in a secure bag delivered to pt in room by this RN.

## 2024-03-10 NOTE — Discharge Summary (Signed)
 Physician Discharge Summary  Regina Young FMW:[id number] DOB: 05-Oct-1955 DOA: 02/28/2024  PCP: Dwight Trula SQUIBB, MD  Admit date: 02/28/2024 Discharge date: 03/10/2024  Time spent: 60 minutes  Recommendations for Outpatient Follow-up:  Follow-up with Dwight Trula SQUIBB, MD in 2 weeks.  On follow-up patient will need a comprehensive metabolic profile done to follow-up on electrolytes, renal function, LFTs.  Patient will need a CBC done as well. Follow-up with Dr. Timmy, oncology as scheduled on 03/21/2024 Follow-up with Dr. Theophilus, pulmonary on 04/17/2024   Discharge Diagnoses:  Principal Problem:   Acute respiratory failure with hypoxia (HCC) Active Problems:   CAP (community acquired pneumonia)   Hyperlipidemia   Lung cancer, primary, with metastasis from lung to other site, right (HCC)   Dehydration   DVT (deep venous thrombosis) (HCC)   Transaminitis   Hyponatremia   Normocytic anemia   Discharge Condition: Stable and improved  Diet recommendation: Regular  Filed Weights   02/28/24 2000  Weight: 48.6 kg    History of present illness:  HPI per Dr. Zella Heron Mankowski is a 68 y.o. female with medical history significant for metastatic lung cancer, DVT on Eliquis , GERD, hyperlipidemia being admitted to the hospital with acute hypoxic respiratory failure due to community-acquired pneumonia.  History is provided by the patient as well as her sister who is at the bedside, they state that her fever was as high as 102.9 this morning.  She has some associated nausea, headache and right leg pain which has been bothering her for a few weeks.  She denies any cough, or chest pain.  She has been compliant with her Eliquis .  She recently completed radiotherapy, and is starting chemotherapy soon.   Hospital Course:  #1 acute hypoxic respiratory failure secondary to community-acquired pneumonia -Patient presented with fevers with temps as high as 102.9, nausea headache, right lower extremity  pain. - Chest x-ray done with bilateral upper lobe opacities concerning for infiltrate versus edema. - Blood cultures obtained with no growth to date. - SARS coronavirus 2 PCR negative. - Influenza A and B by PCR negative. - RSV by PCR negative. - Respiratory viral panel negative. - Patient initially placed on IV Rocephin  and IV azithromycin  however due to increased O2 requirements and no significant improvement CT angiogram chest was done which was negative for PE. - Patient seen in consultation by PCCM and antibiotics broadened to vancomycin  and cefepime . - Patient subsequently maintained on IV cefepime  and also on hydrocortisone  to help decrease inflammation.   - Patient also maintained on incentive spirometry.    - Fungitell noted to be positive.     - Patient on BiPAP nightly.   - Fever curve trended down.   -Patient improved clinically. -PCCM recommended slow steroid taper on discharge with transition to Augmentin  to complete a 7-day course of treatment. -Patient also started on Bactrim  for PCP prophylaxis per pulmonary which patient will be discharged on. -Patient improved clinically and will be discharged home in stable and improved condition with outpatient follow-up with PCP and pulmonary. -Fungitell results discussed with PCCM who reviewed the chart, patient had improved clinically, did not have any further O2 requirements and noted to be discharged on Bactrim  for PCP prophylaxis and was felt no further treatment needed at this time. - Outpatient follow-up with pulmonary.   2.  Dehydration - Resolved with hydration.   3.  Community-acquired pneumonia -Presentation chest x-ray bilateral upper lobe opacities. - Patient noted to have a worsening hypoxemia, CT angiogram chest done negative  for PE however did show new multifocal and confluent areas of smooth interlobular septal thickening with superimposed groundglass opacities predominantly throughout bilateral upper lobes with  findings nonspecific may be related to pulmonary edema, ARDS, infection or other inflammatory processes. - Was on IV Rocephin  and azithromycin  due to hypoxemia broadened to IV vancomycin  and cefepime  and subsequently maintained on IV cefepime .   - Patient improved clinically and will be transition to Augmentin  on discharge for 3 more days to complete a 7-day course of antibiotic treatment. - See #1. - Outpatient follow-up with PCP in pulmonary.   4.  Hyponatremia -Felt secondary to hypovolemic hyponatremia secondary to dehydration in the setting of acute infection. - Resolved with hydration.   5.  Hypokalemia - Repleted during the hospitalization.   - Outpatient follow-up.   6.  Transaminitis -Felt secondary to acute infection. - LFTs trended down. - Acute hepatitis panel negative. - CMV antibody IgG elevated at 9.50. - CMV IgM negative. - EBV IgM negative. - EBV IgG elevated > 600. - CT abdomen and pelvis done on day of admission with scattered rounded hypodensities throughout the liver favored a cyst, gallbladder and bile ducts within normal limits. - Right upper quadrant ultrasound done with no gallstones or ductal dilatation.  Hepatic cysts. - Outpatient follow-up.   7.  History of DVT - Patient maintained on home regimen Eliquis .   8.  Constipation -Resolved. - Patient was maintained on MiraLAX  as needed.    9.  History of lung cancer with metastasis - Patient was maintained on home regimen of MS Contin , oxycodone  as needed for breakthrough pain. 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 - Hematology/oncology followed the patient throughout the hospitalization.   - Outpatient follow-up with hematology/oncology.   10.  Anemia -Patient with no overt bleeding. - Per hematology/oncology patient with low erythropoietin  level of 20. - Hemoglobin noted at 7.7 on 03/08/2024, status post  transfusion 2 units PRBCs ordered per hematology with posttransfusion hemoglobin noted at 11.4 by day of discharge. - Outpatient follow-up with hematology/oncology.    Procedures: Transfusion 2 units PRBCs 03/08/2024 CT head without contrast 02/28/2024 CT abdomen and pelvis 02/28/2024 Chest x-ray 02/28/2024, 03/01/2024 CT chest 03/04/2024 CT angiogram chest 03/02/2024 Right upper quadrant ultrasound 03/06/2024   Significant Hospital Events:  02/18/2024: Office visit with Dr. Timmy plan was to taper of Decadron .  Plan was to get Port-A-Cath and start patient on carboplatin/Alimta/Keytruda.  He noticed that ECOG is 1 Eliquis  was being continued.  He noticed that she had completed radiotherapy to the sacral metastasis   02/28/2024 - admit: : Blood cultures negative   02/29/2024: Chest x-ray with upper lobe opacities.  Requiring 3 L nasal cannula.  Continued on Rocephin  and azithromycin .   03/01/24: Continued on 3 L nasal cannula and antibiotics.  Eliquis  continued.  She was seen by oncology consult Dr. Timmy.  Right radicular pain noticed and Dr. Timmy increased her gabapentin .             - Carbo/Alimta/Keytruda -- tart cycle #1 on 03/01/2024  - GOING TO START but did ot happen due to ilness   03/02/2024: Pulm embolism ruled out.   03/03/2024: Dr. Timmy conserver temperature spike.  Concerning of desaturations continued fevers documented.  CAP  Antibiotics continued.  Respiratory virus panel multiplex negative   03/04/2024: PCCM consulted because of worsening hypoxemia now requiring 6 L.  Record review shows making fevers throughout the course of  hospitalization.             - MRSA PCR negative              - Urine strep -3   03/06/2024: On BiPAP at night    Consultations: Oncology: Dr. Timmy 02/29/2024 PCCM: Dr. Geronimo 03/04/2024  Discharge Exam: Vitals:   03/10/24 0823 03/10/24 1338  BP:  122/60  Pulse:  80  Resp:    Temp:  97.9 F (36.6 C)  SpO2: 95% 97%    General:  NAD Cardiovascular: RRR no murmurs rubs or gallops.  No JVD.  No pitting lower extremity edema. Respiratory: CTAB.  No wheezes, no crackles, no rhonchi.  Fair air movement.  Speaking in full sentences.  Discharge Instructions   Discharge Instructions     Diet general   Complete by: As directed    Increase activity slowly   Complete by: As directed       Allergies as of 03/10/2024   No Known Allergies      Medication List     STOP taking these medications    dexamethasone  4 MG tablet Commonly known as: DECADRON    furosemide  20 MG tablet Commonly known as: LASIX    nystatin  100000 UNIT/ML suspension Commonly known as: MYCOSTATIN    omeprazole  20 MG tablet Commonly known as: PRILOSEC  OTC Replaced by: omeprazole  20 MG capsule       TAKE these medications    acetaminophen  500 MG tablet Commonly known as: TYLENOL  Take 500-1,000 mg by mouth every 6 (six) hours as needed (PAIN).   alendronate  70 MG tablet Commonly known as: FOSAMAX  Take 70 mg by mouth once a week. Take 70 mg every Sunday by mouth 30 minutes before the first food, beverage or medicine of the day with plain water   amoxicillin -clavulanate 875-125 MG tablet Commonly known as: AUGMENTIN  Take 1 tablet by mouth 2 (two) times daily for 3 days.   apixaban  5 MG Tabs tablet Commonly known as: ELIQUIS  Take 1 tablet (5 mg total) by mouth 2 (two) times daily.   Calcium  Citrate 250 MG Tabs Take 250 mg by mouth in the morning.   cyanocobalamin  1000 MCG tablet Commonly known as: VITAMIN B12 Take 1,000 mcg by mouth daily.   fluticasone  50 MCG/ACT nasal spray Commonly known as: FLONASE  Place 2 sprays into both nostrils daily.   gabapentin  300 MG capsule Commonly known as: NEURONTIN  Take 1 capsule (300 mg total) by mouth 3 (three) times daily.   ipratropium-albuterol  0.5-2.5 (3) MG/3ML Soln Commonly known as: DUONEB Take 3 mLs by nebulization 3 (three) times daily for 7 days, THEN 3 mLs every 6 (six)  hours as needed. Start taking on: March 09, 2024   metoprolol  tartrate 25 MG tablet Commonly known as: LOPRESSOR  Take 1 tablet (25 mg total) by mouth 2 (two) times daily.   morphine  15 MG 12 hr tablet Commonly known as: MS CONTIN  Take 1 tablet (15 mg total) by mouth every 12 (twelve) hours.   omeprazole  20 MG capsule Commonly known as: PRILOSEC  Take 2 capsules (40 mg total) by mouth daily before breakfast. Replaces: omeprazole  20 MG tablet   ondansetron  4 MG tablet Commonly known as: ZOFRAN  Take 1 tablet (4 mg total) by mouth every 6 (six) hours as needed for nausea.   oxyCODONE  5 MG immediate release tablet Commonly known as: Oxy IR/ROXICODONE  Take one to two tablets every six hours as needed for pain   predniSONE  10 MG tablet Commonly known as: DELTASONE  Take 4  tablets (40 mg total) by mouth daily with breakfast for 14 days, THEN 3 tablets (30 mg total) daily with breakfast for 14 days, THEN 2 tablets (20 mg total) daily with breakfast for 14 days, THEN 1 tablet (10 mg total) daily with breakfast for 14 days. Start taking on: March 10, 2024   rosuvastatin  5 MG tablet Commonly known as: CRESTOR  Take 5 mg by mouth in the morning.   senna-docusate 8.6-50 MG tablet Commonly known as: Senokot-S Take 1 tablet by mouth 2 (two) times daily.   sulfamethoxazole -trimethoprim  800-160 MG tablet Commonly known as: BACTRIM  DS Take 1 tablet by mouth 3 (three) times a week.   Vitamin D3 50 MCG (2000 UT) Tabs Take 2,000 Units by mouth daily with breakfast.               Durable Medical Equipment  (From admission, onward)           Start     Ordered   03/09/24 1446  For home use only DME Nebulizer machine  Once       Question Answer Comment  Patient needs a nebulizer to treat with the following condition Acute respiratory failure with hypoxia (HCC)   Length of Need 12 Months   Additional equipment included Administration kit   Additional equipment included Filter       03/09/24 1446           No Known Allergies  Follow-up Information     [facility] Follow up.   Specialty: Home Health Services Why: [facility] physical therapy/occupational therapy/nursing Contact information: [address] [place] [address] [phone]         [facility] Follow up.   Why: nebulizer machine Contact information: [address] [place] [phone]        Dwight Trula SQUIBB, MD. Schedule an appointment as soon as possible for a visit in 2 week(s).   Specialty: Internal Medicine Contact information: [address] [place] [address] [phone]         Timmy Maude SAUNDERS, MD Follow up on 03/21/2024.   Specialty: Oncology Why: Follow-up as scheduled on 03/21/2024 at 10 AM. Contact information: [address] [place] [address] [phone]         Theophilus Roosevelt, MD Follow up on 04/17/2024.   Specialty: Pulmonary Disease Why: Follow-up as scheduled at 10:30 AM. Contact information: [address] [place] [address] [phone]                  The results of significant diagnostics from this hospitalization (including imaging, microbiology, ancillary and laboratory) are listed below for reference.    Significant Diagnostic Studies: US  Abdomen Limited RUQ (LIVER/GB) Result Date: 03/06/2024 CLINICAL DATA:  Elevated liver function tests EXAM: ULTRASOUND ABDOMEN LIMITED RIGHT UPPER QUADRANT COMPARISON:  CT 02/28/2024.  renal ultrasound 01/13/2024. FINDINGS: Gallbladder: Distended gallbladder. No shadowing stones. No wall thickening or adjacent fluid. Common bile duct: Diameter: 5 mm Liver: Preserved hepatic echotexture. Small renal cysts are identified which are anechoic with through transmission measuring up to 18 mm. Portal vein is patent on color Doppler imaging with normal direction of blood flow towards the liver. Other: Incidental note is made of  collecting system dilatation of the right kidney which is severe. Please correlate with CT scan from 02/28/2024. IMPRESSION: No gallstones or ductal dilatation. Hepatic cysts. 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  Please correlate with separate CT scan from 02/28/2024. Electronically Signed   By: Ranell Bring M.D.   On: 03/06/2024 18:02   CT CHEST WO CONTRAST Result Date: 03/04/2024 CLINICAL DATA:  Evaluate for pneumonia. History of rectal carcinoma. EXAM: CT CHEST WITHOUT CONTRAST TECHNIQUE: Multidetector CT imaging of the chest was performed following the standard protocol without IV contrast. RADIATION DOSE REDUCTION: This exam was performed according to the departmental dose-optimization program which includes automated exposure control, adjustment of the mA and/or kV according to patient size and/or use of iterative reconstruction technique. COMPARISON:  CT angio chest from 03/02/2024 FINDINGS: Cardiovascular: Heart size is within normal limits. Right chest wall port a catheter is noted with catheter tip at the superior cavoatrial junction. No pericardial effusion. Coronary artery atherosclerotic calcifications. Mediastinum/Nodes: Thyroid gland, trachea, and esophagus appear normal. No enlarged supraclavicular, axillary or mediastinal lymph nodes. Hilar lymph nodes suboptimally evaluated due to lack of IV contrast. Lungs/Pleura: The central airways are patent. Persistent and slightly increased pleural fluid overlying the posterior and lateral right lower lobe. Overlying progressive airspace consolidation is identified. The lungs appear similar appearance of diffuse increase interstitial thickening and ground-glass attenuation within upper lung zone predominance. As noted previously there is some mild peripheral consolidative change within the anterior right upper lobe. Within the basilar right upper lobe there is a 4 mm lung nodule, similar to previous exam, image 69/8. Upper Abdomen:  Severe right hydronephrosis is again noted as seen on the CT from 03/02/2024. Unchanged. Similar appearance of low-attenuation splenic lesions. Musculoskeletal: No acute or suspicious osseous findings. IMPRESSION: 1. Persistent and slightly increased pleural fluid overlying the posterior and lateral right lower lobe. Overlying progressive airspace consolidation is identified. Findings are concerning for progressive pneumonia. 2. Similar appearance of diffuse increase interstitial thickening and ground-glass attenuation within upper lung zone predominance. Findings are nonspecific and may reflect pulmonary edema or atypical infection. Hypersensitivity pneumonitis in the setting of ongoing chemotherapy is also a differential consideration. 3. Stable appearance of 4 mm lung nodule within the basilar right upper lobe. Attention on follow-up imaging recommended. 4. Stable appearance of severe right hydronephrosis as seen on the CT from 03/02/2024. 5.  Aortic Atherosclerosis (ICD10-I70.0). Electronically Signed   By: Waddell Calk M.D.   On: 03/04/2024 10:37   CT Angio Chest Pulmonary Embolism (PE) W or WO Contrast Result Date: 03/02/2024 CLINICAL DATA:  High probability for PE.  History of rectal cancer. EXAM: CT ANGIOGRAPHY CHEST WITH CONTRAST TECHNIQUE: Multidetector CT imaging of the chest was performed using the standard protocol during bolus administration of intravenous contrast. Multiplanar CT image reconstructions and MIPs were obtained to evaluate the vascular anatomy. RADIATION DOSE REDUCTION: This exam was performed according to the departmental dose-optimization program which includes automated exposure control, adjustment of the mA and/or kV according to patient size and/or use of iterative reconstruction technique. CONTRAST:  75mL OMNIPAQUE  IOHEXOL  350 MG/ML SOLN COMPARISON:  CT chest 12/23/2023.  PET-CT 01/03/2024. FINDINGS: Cardiovascular: Satisfactory opacification of the pulmonary arteries to the  segmental level. No evidence of pulmonary embolism. Normal heart size. No pericardial effusion. Right chest port catheter tip ends in the right atrium. Mediastinum/Nodes: Enlarged right hilar lymph node measuring 11 mm has increased in size. No other enlarged lymph nodes are seen. Visualized esophagus and thyroid gland are within normal limits. Lungs/Pleura: There is new multifocal and confluent areas of smooth interlobular septal thickening with superimposed ground-glass opacities are seen predominantly throughout the bilateral upper lobes and minimally in the right middle lobe  and superior segment of the right lower lobe. There is a trace right pleural effusion with atelectasis in the right lung base. Right suprahilar nodular density appears grossly unchanged measuring 1.9 x 1.7 cm on coronal image 7/38. Upper Abdomen: Severe right hydronephrosis is partially imaged and unchanged. Low-attenuation lesions in the spleen are stable. Musculoskeletal: No chest wall abnormality. No acute or significant osseous findings. Review of the MIP images confirms the above findings. IMPRESSION: 1. No evidence for pulmonary embolism. 2. New multifocal and confluent areas of smooth interlobular septal thickening with superimposed ground-glass opacities predominantly throughout the bilateral upper lobes. Findings are nonspecific and may be related to pulmonary edema, ARDS, infection, or other inflammatory processes. 3. Trace right pleural effusion. 4. Stable right suprahilar nodular density. 5. Enlarged right hilar lymph node has increased in size. 6. Stable severe right hydronephrosis. Electronically Signed   By: Greig Pique M.D.   On: 03/02/2024 18:29   DG CHEST PORT 1 VIEW Result Date: 03/01/2024 CLINICAL DATA:  Follow-up pneumonia. EXAM: PORTABLE CHEST 1 VIEW COMPARISON:  02/28/2024 FINDINGS: There is a right chest wall port a catheter with tip in the projection of the right atrium. Heart size and mediastinal contours are  stable. Persistent bilateral upper lobe airspace opacities, unchanged from previous exam. No signs of pleural effusion or pneumothorax. Visualized osseous structures are unremarkable. IMPRESSION: Persistent bilateral upper lobe airspace opacities compatible with pneumonia. No significant change from previous exam. Electronically Signed   By: Waddell Calk M.D.   On: 03/01/2024 14:06   CT Head Wo Contrast Result Date: 02/28/2024 CLINICAL DATA:  Headache and right leg pain. EXAM: CT HEAD WITHOUT CONTRAST TECHNIQUE: Contiguous axial images were obtained from the base of the skull through the vertex without intravenous contrast. RADIATION DOSE REDUCTION: This exam was performed according to the departmental dose-optimization program which includes automated exposure control, adjustment of the mA and/or kV according to patient size and/or use of iterative reconstruction technique. COMPARISON:  December 22, 2023 FINDINGS: Brain: No evidence of acute infarction, hemorrhage, hydrocephalus, extra-axial collection or mass lesion/mass effect. Vascular: No hyperdense vessel or unexpected calcification. Skull: Normal. Negative for fracture or focal lesion. Sinuses/Orbits: No acute finding. Other: A small, stable partially calcified left posterior parietal scalp soft tissue nodule is seen. IMPRESSION: No acute intracranial abnormality. Electronically Signed   By: Suzen Dials M.D.   On: 02/28/2024 16:15   CT ABDOMEN PELVIS W CONTRAST Result Date: 02/28/2024 CLINICAL DATA:  History of rectal cancer.  Abdominal pain and fever. EXAM: CT ABDOMEN AND PELVIS WITH CONTRAST TECHNIQUE: Multidetector CT imaging of the abdomen and pelvis was performed using the standard protocol following bolus administration of intravenous contrast. RADIATION DOSE REDUCTION: This exam was performed according to the departmental dose-optimization program which includes automated exposure control, adjustment of the mA and/or kV according to patient  size and/or use of iterative reconstruction technique. CONTRAST:  OMNIPAQUE  IOHEXOL  300 MG/ML  SOLN COMPARISON:  CT venogram abdomen and pelvis 02/15/2024. FINDINGS: Lower chest: No acute abnormality. Hepatobiliary: There are scattered rounded hypodensities throughout the liver favored as cysts. These measure up to 19 mm and appear unchanged from the prior study. No new liver lesions are seen. Gallbladder and bile ducts are within normal limits. Pancreas: Unremarkable. No pancreatic ductal dilatation or surrounding inflammatory changes. Spleen: Rounded hypodensities in the spleen are unchanged, possibly cysts or hemangiomas. Spleen is normal in size. Adrenals/Urinary Tract: There is stable severe right-sided hydroureteronephrosis to the level of the pelvic inlet/surgical staples. The left  kidney, adrenal glands and bladder are within normal limits. The bladder is markedly distended. Stomach/Bowel: There is no bowel obstruction, focal wall thickening, inflammation or free air identified. The appendix is not visualized. Rectosigmoid anastomosis is present. There is a large amount of stool throughout the entire colon the stomach is within normal limits. Vascular/Lymphatic: Aortic atherosclerosis. No enlarged abdominal or pelvic lymph nodes. Reproductive: Status post hysterectomy. No adnexal masses. Other: There is no ascites or focal abdominal wall hernia. Musculoskeletal: There is a stable soft tissue mass with osseous involvement the level of the right L5-S1 anteriorly. This measures approximally 5.5 x 2.7 cm. There is new pathologic fracture along the right superior endplate of S1. IMPRESSION: 1. New pathologic fracture along the right superior endplate of S1. 2. Stable soft tissue mass with osseous involvement at the level of the right L5-S1 disc space. 3. Stable severe right-sided hydroureteronephrosis to the level of the pelvic inlet/surgical staples. 4. Marked distention of the bladder. 5. Large amount of  stool throughout the colon. 6. Aortic atherosclerosis. Aortic Atherosclerosis (ICD10-I70.0). Electronically Signed   By: Greig Pique M.D.   On: 02/28/2024 16:03   DG Chest Port 1 View Result Date: 02/28/2024 CLINICAL DATA:  Sepsis. EXAM: PORTABLE CHEST 1 VIEW COMPARISON:  Chest radiograph dated 01/04/2024. FINDINGS: Right-sided Port-A-Cath with tip close to the cavoatrial junction. Bilateral upper lobe opacities, new since the prior radiograph and may represent infiltrate or edema. No pleural effusion or pneumothorax. Stable cardiac silhouette. No acute osseous pathology. IMPRESSION: Bilateral upper lobe opacities, new since the prior radiograph and may represent infiltrate or edema. Electronically Signed   By: Vanetta Chou M.D.   On: 02/28/2024 12:54   IR IMAGING GUIDED PORT INSERTION Result Date: 02/22/2024 INDICATION: port for chemo history RIGHT lung cancer. EXAM: IMPLANTED PORT A CATH PLACEMENT WITH ULTRASOUND AND FLUOROSCOPIC GUIDANCE MEDICATIONS: None ANESTHESIA/SEDATION: Moderate (conscious) sedation was employed during this procedure. A total of Versed  2 mg and Fentanyl  50 mcg was administered intravenously. Moderate Sedation Time: 21 minutes. The patient's level of consciousness and vital signs were monitored continuously by radiology nursing throughout the procedure under my direct supervision. FLUOROSCOPY: Radiation Exposure Index and estimated peak skin dose (PSD); Reference air kerma (RAK), 0.4 mGy. Kerma-area product (KAP), 5.39 uGy*m. COMPLICATIONS: None immediate. PROCEDURE: The procedure, risks, benefits, and alternatives were explained to the patient. Questions regarding the procedure were encouraged and answered. The patient understands and consents to the procedure. The RIGHT neck and chest were prepped with chlorhexidine  in a sterile fashion, and a sterile drape was applied covering the operative field. Maximum barrier sterile technique with sterile gowns and gloves were used for  the procedure. A timeout was performed prior to the initiation of the procedure. Local anesthesia was provided with 1% lidocaine  with epinephrine . After creating a small venotomy incision, a micropuncture kit was utilized to access the internal jugular vein under direct, real-time ultrasound guidance. Ultrasound image documentation was performed. The microwire was kinked to measure appropriate catheter length. A subcutaneous port pocket was then created along the upper chest wall utilizing a combination of sharp and blunt dissection. The pocket was irrigated with sterile saline. A single lumen power injectable port was chosen for placement. The 8 Fr catheter was tunneled from the port pocket site to the venotomy incision. The port was placed in the pocket. The external catheter was trimmed to appropriate length. At the venotomy, an 8 Fr peel-away sheath was placed over a guidewire under fluoroscopic guidance. The catheter was then placed  through the sheath and the sheath was removed. Final catheter positioning was confirmed and documented with a fluoroscopic spot radiograph. The port was accessed with a Huber needle, aspirated and flushed with heparinized saline. The port pocket incision was closed with interrupted 3-0 Vicryl suture then Dermabond was applied, including at the venotomy incision. Dressings were placed. The patient tolerated the procedure well without immediate post procedural complication. IMPRESSION: Successful placement of a RIGHT internal jugular approach power injectable Port-A-Cath. The tip of the catheter is positioned within the proximal RIGHT atrium. The catheter is ready for immediate use. Thom Hall, MD [facility] Radiology Electronically Signed   By: Thom Hall M.D.   On: 02/22/2024 13:08   VAS US  LOWER EXTREMITY VENOUS (DVT) (7a-7p) Result Date: 02/15/2024  Lower Venous DVT Study Patient Name:  Regina Young  Date of Exam:    02/15/2024 Medical Rec #: [id number]      Accession #:    [id number] Date of Birth: July 19, 1956      Patient Gender: F Patient Age:   20 years Exam Location:  [facility] Procedure:      VAS US  LOWER EXTREMITY VENOUS (DVT) Referring Phys: JAYSON PEREYRA --------------------------------------------------------------------------------  Indications: Swelling.  Risk Factors: DVT. Anticoagulation: Eliquis . Limitations: Poor ultrasound/tissue interface. Comparison Study: 02/01/2024 - RIGHT:                    - Findings consistent with age indeterminate deep vein                   thrombosis                   involving the right posterior tibial veins.                    - No cystic structure found in the popliteal fossa.                    LEFT:                   - No evidence of common femoral vein obstruction. Performing Technologist: Cordella Collet RVT  Examination Guidelines: A complete evaluation includes B-mode imaging, spectral Doppler, color Doppler, and power Doppler as needed of all accessible portions of each vessel. Bilateral testing is considered an integral part of a complete examination. Limited examinations for reoccurring indications may be performed as noted. The reflux portion of the exam is performed with the patient in reverse Trendelenburg.  +---------+---------------+---------+-----------+----------+--------------+ RIGHT    CompressibilityPhasicitySpontaneityPropertiesThrombus Aging +---------+---------------+---------+-----------+----------+--------------+ CFV      Full           Yes      Yes                                 +---------+---------------+---------+-----------+----------+--------------+ SFJ      Full                                                        +---------+---------------+---------+-----------+----------+--------------+ FV Prox  Full                                                         +---------+---------------+---------+-----------+----------+--------------+  FV Mid   Full                                                        +---------+---------------+---------+-----------+----------+--------------+ FV DistalFull                                                        +---------+---------------+---------+-----------+----------+--------------+ PFV      Full                                                        +---------+---------------+---------+-----------+----------+--------------+ POP      Full           Yes      Yes                                 +---------+---------------+---------+-----------+----------+--------------+ PTV      Full                                                        +---------+---------------+---------+-----------+----------+--------------+ PERO     Full                                                        +---------+---------------+---------+-----------+----------+--------------+   +----+---------------+---------+-----------+----------+--------------+ LEFTCompressibilityPhasicitySpontaneityPropertiesThrombus Aging +----+---------------+---------+-----------+----------+--------------+ CFV Full           Yes      Yes                                 +----+---------------+---------+-----------+----------+--------------+     Summary: RIGHT: - There is no evidence of deep vein thrombosis in the lower extremity. However, portions of this examination were limited- see technologist comments above.  - No cystic structure found in the popliteal fossa.  LEFT: - No evidence of common femoral vein obstruction.   *See table(s) above for measurements and observations. Electronically signed by Debby Robertson on 02/15/2024 at 10:06:22 PM.    Final    CT VENOGRAM ABD/PELVIS/LOWER EXT BILAT Result Date: 02/15/2024 CLINICAL DATA:  Worsening, now bilateral, lower extremity swelling after recent right leg DVT. History of rectal  cancer. EXAM: CT VENOGRAM ABDOMEN AND PELVIS AND LOWER EXTREMITY BILATERAL TECHNIQUE: Venographic phase images of the abdomen, pelvis and lower extremities were obtained following the administration of intravenous contrast. Multiplanar reformats and maximum intensity projections were generated. RADIATION DOSE REDUCTION: This exam was performed according to the departmental dose-optimization program which includes automated exposure control, adjustment of the mA and/or kV according to patient size and/or use of iterative reconstruction technique. CONTRAST:  100mL OMNIPAQUE   IOHEXOL  350 MG/ML SOLN COMPARISON:  Nuclear medicine PET dated 01/03/2024 FINDINGS: Lower chest: No focal consolidation or pulmonary nodule in the lung bases. No pleural effusion or pneumothorax demonstrated. Partially imaged heart size is normal. Hepatobiliary: Scattered hepatic cysts. No intra or extrahepatic biliary ductal dilation. Normal gallbladder. Pancreas: No focal lesions or main ductal dilation. Spleen: Scattered splenic cyst.  Normal splenic size. Adrenals/Urinary Tract: No adrenal nodules. Similar severe right hydroureteronephrosis to the level of the distal ureter adjacent to the right pelvic surgical clips and sacral mass. Asymmetric enhancement of the left kidney. No left hydronephrosis or renal calculi. No suspicious renal masses. No suspicious filling defect visualized within the opacified portions of the left collecting systems or ureter on delayed imaging. No focal bladder wall thickening. Stomach/Bowel: Ingested radiodense tablet within the gastric antrum. No evidence of bowel wall thickening, distention, or inflammatory changes. Moderate to large volume stool throughout the colon. Appendix is not discretely seen. Vascular/Lymphatic: Aortic atherosclerosis. No enlarged abdominal or pelvic lymph nodes. Reproductive: No adnexal masses. Other: No free fluid, fluid collection, or free air. Surgical clips in the region of bilateral  iliac vessels. Musculoskeletal: Slight interval increase in size of a hypoattenuating soft tissue centered within the right sacral ala measuring approximately 5.9 x 4.3 cm (2:46), previously 5.3 x 3.6 cm when remeasured. There is soft tissue extension presacral E at the level of the L5-S1 and posteriorly into the spinal canal (6:86), increased compared to 01/03/2024. Lipoma within the right adductors measuring 3.8 x 2.6 cm (2:86). Circumferential soft tissue edema of the legs, right-greater-than-left. IVC: No evidence for thrombus or stenosis. Portal and mesenteric veins: No evidence for thrombus or stenosis. Bilateral iliac veins: Mild effacement of the proximal left common iliac vein, where it crosses the soft tissue mass at the level of L5-S1. Otherwise, no evidence for thrombus or stenosis. Right lower extremity: No evidence for thrombus involving the common femoral, femoral, popliteal and visualized deep calf veins Left lower extremity: No evidence for thrombus involving the common femoral, femoral, popliteal and visualized deep calf veins. Focal dilation of the distal femoral vein (10:967). IMPRESSION: 1. No evidence of deep venous thrombosis in the bilateral lower extremities. 2. Mild effacement of the proximal left common iliac vein, where it crosses the soft tissue mass at the level of L5-S1. 3. Slight interval increase in size of a hypoattenuating soft tissue centered within the right sacral ala with increased anterior presacral and posterior spinal canal soft tissue extension. 4. Similar severe right hydroureteronephrosis to the level of the distal ureter adjacent to the right pelvic surgical clips and sacral mass. 5. Circumferential soft tissue edema of the legs, right-greater-than-left. 6.  Aortic Atherosclerosis (ICD10-I70.0). Electronically Signed   By: Limin  Xu M.D.   On: 02/15/2024 16:58    Microbiology: Recent Results (from the past 240 hours)  Respiratory (~20 pathogens) panel by PCR      Status: None   Collection Time: 03/03/24  6:42 PM   Specimen: Nasopharyngeal Swab; Respiratory  Result Value Ref Range Status   Adenovirus NOT DETECTED NOT DETECTED Final   Coronavirus 229E NOT DETECTED NOT DETECTED Final    Comment: (NOTE) The Coronavirus on the Respiratory Panel, DOES NOT test for the novel  Coronavirus (2019 nCoV)    Coronavirus HKU1 NOT DETECTED NOT DETECTED Final   Coronavirus NL63 NOT DETECTED NOT DETECTED Final   Coronavirus OC43 NOT DETECTED NOT DETECTED Final   Metapneumovirus NOT DETECTED NOT DETECTED Final   Rhinovirus / Enterovirus NOT DETECTED  NOT DETECTED Final   Influenza A NOT DETECTED NOT DETECTED Final   Influenza B NOT DETECTED NOT DETECTED Final   Parainfluenza Virus 1 NOT DETECTED NOT DETECTED Final   Parainfluenza Virus 2 NOT DETECTED NOT DETECTED Final   Parainfluenza Virus 3 NOT DETECTED NOT DETECTED Final   Parainfluenza Virus 4 NOT DETECTED NOT DETECTED Final   Respiratory Syncytial Virus NOT DETECTED NOT DETECTED Final   Bordetella pertussis NOT DETECTED NOT DETECTED Final   Bordetella Parapertussis NOT DETECTED NOT DETECTED Final   Chlamydophila pneumoniae NOT DETECTED NOT DETECTED Final   Mycoplasma pneumoniae NOT DETECTED NOT DETECTED Final    Comment: Performed at [facility], [address] [place], [place] [address]  MRSA Next Gen by PCR, Nasal     Status: None   Collection Time: 03/04/24  2:38 PM   Specimen: Nasal Mucosa; Nasal Swab  Result Value Ref Range Status   MRSA by PCR Next Gen NOT DETECTED NOT DETECTED Final    Comment: (NOTE) The GeneXpert MRSA Assay (FDA approved for NASAL specimens only), is one component of a comprehensive MRSA colonization surveillance program. It is not intended to diagnose MRSA infection nor to guide or monitor treatment for MRSA infections. Test performance is not FDA approved in patients less than 59 years old. Performed at [facility], [address], [place], [place] [address]      Labs: Basic Metabolic Panel: Recent Labs  Lab 03/06/24 0318 03/07/24 0420 03/08/24 0315 03/09/24 0412 03/10/24 0227  NA 140 138 143 143 141  K 3.8 3.4* 4.0 3.0* 4.0  CL 106 106 112* 109 107  CO2 23 23 23 26 24   GLUCOSE 133* 114* 110* 114* 86  BUN 27* 32* 36* 37* 34*  CREATININE 0.54 0.69 0.64 0.89 0.82  CALCIUM  8.4* 8.0* 8.1* 7.8* 8.0*  MG  --   --   --  2.2  --    Liver Function Tests: Recent Labs  Lab 03/06/24 0318 03/07/24 0420 03/08/24 0315 03/09/24 0412 03/10/24 0227  AST 297* 444* 204* 110* 64*  ALT 364* 520* 447* 366* 285*  ALKPHOS 206* 193* 177* 165* 151*  BILITOT 0.4 0.5 0.4 0.5 0.5  PROT 5.9* 5.7* 5.3* 5.6* 5.2*  ALBUMIN 1.9* 1.8* 1.8* 2.0* 2.1*   No results for input(s): LIPASE, AMYLASE in the last 168 hours. No results for input(s): AMMONIA in the last 168 hours. CBC: Recent Labs  Lab 03/06/24 0318 03/07/24 0420 03/08/24 0315 03/09/24 0412 03/10/24 0227  WBC 5.5 8.4 8.9 11.2* 9.2  NEUTROABS  --  7.3 6.8 7.5 7.2  HGB 8.3* 7.9* 7.7* 11.2* 11.4*  HCT 27.3* 25.8* 25.5* 35.8* 36.6  MCV 95.1 93.8 95.1 90.9 92.7  PLT 348 368 377 376 358   Cardiac Enzymes: No results for input(s): CKTOTAL, CKMB, CKMBINDEX, TROPONINI in the last 168 hours. BNP: BNP (last 3 results) Recent Labs    02/15/24 1449 02/28/24 1132  BNP 46.9 47.7    ProBNP (last 3 results) No results for input(s): PROBNP in the last 8760 hours.  CBG: No results for input(s): GLUCAP in the last 168 hours.     Signed:  Toribio Hummer MD.  [facility] 03/10/2024, 3:03 PM

## 2024-03-10 NOTE — TOC Transition Note (Addendum)
 Transition of Care [facility]) - Discharge Note   Patient Details  Name: Regina Young MRN: [id number] Date of Birth: 02/26/1956  Transition of [facility]) CM/SW Contact:  Sonda Manuella Quill, RN Phone Number: 03/10/2024, 3:00 PM   Clinical Narrative:    D/C orders received; pt established w/ Arlina for HHPT/OT/RN; LVM for Arna Signs and Gaetana Dadds at agency for notification; no TOC needs.  -1511- notified nebulizer has not been delivered; notified Jermaine at [facility]; device will be delivered to room.  -1513- Gaetana Dadds confirmed receipt of notification. Final next level of care: Home w Home Health Services Barriers to Discharge: No Barriers Identified   Patient Goals and CMS Choice   CMS Medicare.gov Compare Post Acute Care list provided to:: Patient Represenative (must comment) (Tony(spouse)) Choice offered to / list presented to : Spouse [place] ownership interest in [facility].provided to:: Spouse    Discharge Placement                       Discharge Plan and Services Additional resources added to the After Visit Summary for     Discharge Planning Services: CM Consult Post Acute Care Choice: Home Health          DME Arranged: Nebulizer machine DME Agency: [employer] Date DME Agency Contacted: 03/09/24 Time DME Agency Contacted: 1455 Representative spoke with at DME Agency: [place]            Social Drivers of Health (SDOH) Interventions SDOH Screenings   Food Insecurity: No Food Insecurity (02/28/2024)  Housing: Low Risk  (02/28/2024)  Transportation Needs: No Transportation Needs (02/29/2024)  Utilities: Not At Risk (02/29/2024)  Depression (PHQ2-9): Low Risk  (01/17/2024)  Financial Resource Strain: Low Risk  (01/10/2024)  Social Connections: Moderately Isolated (02/29/2024)  Tobacco Use: Medium Risk (02/28/2024)     Readmission Risk Interventions    02/29/2024   11:10 AM  Readmission Risk Prevention Plan   Transportation Screening Complete  PCP or Specialist Appt within 3-5 Days Complete  HRI or Home Care Consult Complete  Social Work Consult for Recovery Care Planning/Counseling Complete  Palliative Care Screening Complete  Medication Review Oceanographer) Complete

## 2024-03-10 NOTE — Progress Notes (Signed)
 She really looks good this morning.  She feels good.  She did not have the BiPAP last night.  She does not wear any oxygen right now.  Oxygen saturation is 94%.  It sounds like she may go home today.  I certainly would have no problems with this.  She is eating okay.  She does not have any abdominal pain.  There is no diarrhea.  Her right leg and foot are about the same.  She does not complain of any pain.  She has had no fever.  There has been no bleeding.  Her labs today show sodium 141.  Potassium 4.0.  BUN 34 creatinine 0.82.  Calcium  8 with an albumin of 2.1.  Her LFTs continue to improve.  SGPT to 85 and SGOT 64.  Her white cell count is 9.2.  Hemoglobin 9.4.  Platelet count 358,000.  She has had no headache.  There is been no mouth sores.  Her vital signs are temperature 98.2.  Pulse 85.  Blood pressure 135/84.  Her lungs sound good bilaterally.  She has good air movement bilaterally.  Cardiac exam regular rate and rhythm.  She has no murmurs.  Abdomen is soft.  Bowel sounds are present.  She has no fluid wave.  There is no palpable liver or spleen tip.  Extremities shows no clubbing, cyanosis or edema.  Neurological exam shows no focal neurological deficits.  Again, Regina Young may go home today.  That would be wonderful.  I will give her a week off from any treatment.  I think we can then we will get her back to the office the week of 03/20/2024 and then start treatment systemically for her cancer.  I do appreciate the wonderful care that she has received for everybody on 4 E.   Jeralyn Crease, MD  [place] 25:10

## 2024-03-10 NOTE — Progress Notes (Signed)
 Physical Therapy Treatment Patient Details Name: Regina Young MRN: [id number] DOB: 05-09-1956 Today's Date: 03/10/2024   History of Present Illness 68 yo female presents to therapy following hospital admission on 02/28/2024 due to elevated tempeture, SOB and AMS. Pt found to have acute respiratory failure secondary to CAP. 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    PT Comments  Pt is AxO x 3 pleasant and hoping to be able to go home today.  Assisted with amb in hallway went well. General Gait Details: tolerated a great distance amb with walker and Spouse on RA avg 94% with HR 76.  No true dyspnea.  No dizziness.  I feel so much better, stated Pt. Pt plans to D/C to home when medically cleared. LPT has rec HH.    If plan is discharge home, recommend the following: A little help with walking and/or transfers;A little help with bathing/dressing/bathroom;Assistance with cooking/housework;Help with stairs or ramp for entrance;Assist for transportation   Can travel by private vehicle        Equipment Recommendations  None recommended by PT    Recommendations for Other Services       Precautions / Restrictions Precautions Precautions: Fall Precaution/Restrictions Comments: Mon HR and O2 Restrictions Weight Bearing Restrictions Per Provider Order: No     Mobility  Bed Mobility               General bed mobility comments: Pt OOB in recliner    Transfers Overall transfer level: Needs assistance Equipment used: Rolling walker (2 wheels) Transfers: Sit to/from Stand Sit to Stand: Supervision           General transfer comment: good safety cognition and awareness    Ambulation/Gait Ambulation/Gait assistance: Contact guard assist Gait Distance (Feet): 235 Feet Assistive device: Rolling walker (2 wheels) Gait Pattern/deviations: Step-through pattern,  Decreased stride length Gait velocity: decreased     General Gait Details: tolerated a great distance amb with walker and Spouse on RA avg 94% with HR 76.  No true dyspnea.  No dizziness.  I feel so much better, stated Pt.   Stairs             Wheelchair Mobility     Tilt Bed    Modified Rankin (Stroke Patients Only)       Balance                                            Communication Communication Communication: No apparent difficulties  Cognition Arousal: Alert Behavior During Therapy: WFL for tasks assessed/performed   PT - Cognitive impairments: No apparent impairments                       PT - Cognition Comments: AxO x 3 pleasant and very supportive spouse at bedside Following commands: Intact      Cueing Cueing Techniques: Verbal cues  Exercises      General Comments        Pertinent Vitals/Pain Pain Assessment Pain Assessment: No/denies pain    Home Living                          Prior Function  PT Goals (current goals can now be found in the care plan section) Progress towards PT goals: Progressing toward goals    Frequency    Min 2X/week      PT Plan      Co-evaluation              AM-PAC PT 6 Clicks Mobility   Outcome Measure  Help needed turning from your back to your side while in a flat bed without using bedrails?: None Help needed moving from lying on your back to sitting on the side of a flat bed without using bedrails?: None Help needed moving to and from a bed to a chair (including a wheelchair)?: None Help needed standing up from a chair using your arms (e.g., wheelchair or bedside chair)?: None Help needed to walk in hospital room?: None Help needed climbing 3-5 steps with a railing? : A Little 6 Click Score: 23    End of Session Equipment Utilized During Treatment: Gait belt Activity Tolerance: Patient tolerated treatment well;No increased  pain Patient left: in chair;with call bell/phone within reach;with family/visitor present Nurse Communication: Mobility status PT Visit Diagnosis: Unsteadiness on feet (R26.81);Muscle weakness (generalized) (M62.81);Difficulty in walking, not elsewhere classified (R26.2)     Time: 1000-1019 PT Time Calculation (min) (ACUTE ONLY): 19 min  Charges:    $Gait Training: 8-22 mins PT General Charges $$ ACUTE PT VISIT: 1 Visit                    Katheryn Leap  PTA Acute  Rehabilitation Services Office M-F          [phone]

## 2024-03-11 ENCOUNTER — Encounter: Payer: Self-pay | Admitting: Pulmonary Disease

## 2024-03-11 NOTE — Progress Notes (Signed)
 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  Significance is unclear as patient has improved in terms of respiratory status with antibiotics, steroids and was discharged home.  Suspicion for invasive fungal infection or pneumocystis is low.  We will reassess at return clinic visit next month.  Royalti Schauf MD [facility] See Amion for pager  If no response to pager , please call [phone] until 7pm After 7:00 pm call Elink  [phone] 03/11/2024, 10:55 AM

## 2024-03-13 ENCOUNTER — Telehealth: Payer: Self-pay | Admitting: Radiation Oncology

## 2024-03-13 ENCOUNTER — Ambulatory Visit: Admitting: Radiation Oncology

## 2024-03-13 LAB — HYPERSENSITIVITY PNEUMONITIS
A. Pullulans Abs: NEGATIVE
A.Fumigatus #1 Abs: NEGATIVE
Micropolyspora faeni, IgG: NEGATIVE
Pigeon Serum Abs: NEGATIVE
Thermoact. Saccharii: NEGATIVE
Thermoactinomyces vulgaris, IgG: NEGATIVE

## 2024-03-13 NOTE — Telephone Encounter (Signed)
 Pt called advising after recent discharge from hospital she is still feeling weak and feels like she needs to recover strength before coming to appt. Pt agreed to 7/17@11 :30am.

## 2024-03-14 ENCOUNTER — Other Ambulatory Visit: Payer: Self-pay | Admitting: Family

## 2024-03-14 ENCOUNTER — Other Ambulatory Visit: Payer: Self-pay | Admitting: *Deleted

## 2024-03-14 ENCOUNTER — Encounter: Payer: Self-pay | Admitting: *Deleted

## 2024-03-14 DIAGNOSIS — C3491 Malignant neoplasm of unspecified part of right bronchus or lung: Secondary | ICD-10-CM

## 2024-03-14 DIAGNOSIS — C786 Secondary malignant neoplasm of retroperitoneum and peritoneum: Secondary | ICD-10-CM

## 2024-03-14 MED ORDER — FOLIC ACID 1 MG PO TABS: 1.0000 mg | ORAL_TABLET | Freq: Every day | ORAL | 3 refills | Status: DC

## 2024-03-14 MED ORDER — PROCHLORPERAZINE MALEATE 10 MG PO TABS: 10.0000 mg | ORAL_TABLET | Freq: Four times a day (QID) | ORAL | 1 refills | Status: DC | PRN

## 2024-03-14 MED ORDER — DEXAMETHASONE 4 MG PO TABS: ORAL_TABLET | ORAL | 1 refills | Status: DC

## 2024-03-14 MED ORDER — MORPHINE SULFATE ER 15 MG PO TBCR: 15.0000 mg | EXTENDED_RELEASE_TABLET | Freq: Two times a day (BID) | ORAL | 0 refills | Status: DC

## 2024-03-14 MED ORDER — ONDANSETRON HCL 8 MG PO TABS: 8.0000 mg | ORAL_TABLET | Freq: Three times a day (TID) | ORAL | 1 refills | Status: DC | PRN

## 2024-03-14 NOTE — Progress Notes (Signed)
 Patient calling with request for refill on MS Contin . Pharmacy confirmed and refill sent. She also had several questions about her appointments next Tuesday. Answered all of them to her satisfaction.   Oncology Nurse Navigator Documentation     03/14/2024   12:00 PM  Oncology Nurse Navigator Flowsheets  Navigator Follow Up Date: 03/21/2024  Navigator Follow Up Reason: Follow-up Appointment;Chemo Class;Chemotherapy  Financial risk analyst Encounter Type Telephone  Telephone Medication Assistance;Incoming Call  Patient Visit Type MedOnc  Treatment Phase Active Tx  Barriers/Navigation Needs Coordination of Care;Education  Education Preparing for Upcoming Surgery/ Treatment  Interventions Education;Medication Assistance  Acuity Level 2-Minimal Needs (1-2 Barriers Identified)  Education Method Verbal  Support Groups/Services Friends and Family  Time Spent with Patient 15

## 2024-03-15 ENCOUNTER — Encounter: Payer: Self-pay | Admitting: *Deleted

## 2024-03-15 NOTE — Progress Notes (Signed)
 Patient picked up her medication this morning, and had unexpected medications she wanted to review. Explained to her that these meds went along with her treatment plan, and the chemo nurse would review them in greater detail, however we did review two medications in more detail:  Folic acid . Patient will start this today and take daily.   Decadron . Patient knows to start this Monday morning. She will take with breakfast and dinner. She understands more instructions will foll for this medication on Tuesday.   She is encouraged to call or reach out with any questions or concerns.   Oncology Nurse Navigator Documentation     03/15/2024   10:30 AM  Oncology Nurse Navigator Flowsheets  Navigator Follow Up Date: 03/21/2024  Navigator Follow Up Reason: Follow-up Appointment;Chemotherapy  Financial risk analyst Encounter Type Telephone  Telephone Medication Assistance;Incoming Call  Patient Visit Type MedOnc  Treatment Phase Active Tx  Barriers/Navigation Needs Coordination of Care;Education  Education Preparing for Upcoming Surgery/ Treatment  Interventions Education;Medication Assistance  Acuity Level 2-Minimal Needs (1-2 Barriers Identified)  Education Method Verbal;Teach-back  Support Groups/Services Friends and Family  Time Spent with Patient 15

## 2024-03-16 DIAGNOSIS — C786 Secondary malignant neoplasm of retroperitoneum and peritoneum: Secondary | ICD-10-CM | POA: Diagnosis not present

## 2024-03-16 DIAGNOSIS — C7951 Secondary malignant neoplasm of bone: Secondary | ICD-10-CM | POA: Diagnosis not present

## 2024-03-16 DIAGNOSIS — I700 Atherosclerosis of aorta: Secondary | ICD-10-CM | POA: Diagnosis not present

## 2024-03-16 DIAGNOSIS — E785 Hyperlipidemia, unspecified: Secondary | ICD-10-CM | POA: Diagnosis not present

## 2024-03-16 DIAGNOSIS — K219 Gastro-esophageal reflux disease without esophagitis: Secondary | ICD-10-CM | POA: Diagnosis not present

## 2024-03-16 DIAGNOSIS — J9601 Acute respiratory failure with hypoxia: Secondary | ICD-10-CM | POA: Diagnosis not present

## 2024-03-16 DIAGNOSIS — C3491 Malignant neoplasm of unspecified part of right bronchus or lung: Secondary | ICD-10-CM | POA: Diagnosis not present

## 2024-03-16 DIAGNOSIS — C187 Malignant neoplasm of sigmoid colon: Secondary | ICD-10-CM | POA: Diagnosis not present

## 2024-03-16 DIAGNOSIS — K589 Irritable bowel syndrome without diarrhea: Secondary | ICD-10-CM | POA: Diagnosis not present

## 2024-03-16 DIAGNOSIS — E876 Hypokalemia: Secondary | ICD-10-CM | POA: Diagnosis not present

## 2024-03-16 DIAGNOSIS — Z9181 History of falling: Secondary | ICD-10-CM | POA: Diagnosis not present

## 2024-03-16 DIAGNOSIS — I82441 Acute embolism and thrombosis of right tibial vein: Secondary | ICD-10-CM | POA: Diagnosis not present

## 2024-03-16 DIAGNOSIS — N1330 Unspecified hydronephrosis: Secondary | ICD-10-CM | POA: Diagnosis not present

## 2024-03-16 DIAGNOSIS — M6281 Muscle weakness (generalized): Secondary | ICD-10-CM | POA: Diagnosis not present

## 2024-03-16 DIAGNOSIS — Z90710 Acquired absence of both cervix and uterus: Secondary | ICD-10-CM | POA: Diagnosis not present

## 2024-03-16 DIAGNOSIS — Z7901 Long term (current) use of anticoagulants: Secondary | ICD-10-CM | POA: Diagnosis not present

## 2024-03-16 DIAGNOSIS — M8458XD Pathological fracture in neoplastic disease, other specified site, subsequent encounter for fracture with routine healing: Secondary | ICD-10-CM | POA: Diagnosis not present

## 2024-03-16 DIAGNOSIS — K5900 Constipation, unspecified: Secondary | ICD-10-CM | POA: Diagnosis not present

## 2024-03-16 DIAGNOSIS — Z7952 Long term (current) use of systemic steroids: Secondary | ICD-10-CM | POA: Diagnosis not present

## 2024-03-16 DIAGNOSIS — Z87891 Personal history of nicotine dependence: Secondary | ICD-10-CM | POA: Diagnosis not present

## 2024-03-16 DIAGNOSIS — J189 Pneumonia, unspecified organism: Secondary | ICD-10-CM | POA: Diagnosis not present

## 2024-03-16 DIAGNOSIS — E860 Dehydration: Secondary | ICD-10-CM | POA: Diagnosis not present

## 2024-03-16 DIAGNOSIS — E871 Hypo-osmolality and hyponatremia: Secondary | ICD-10-CM | POA: Diagnosis not present

## 2024-03-16 DIAGNOSIS — Z792 Long term (current) use of antibiotics: Secondary | ICD-10-CM | POA: Diagnosis not present

## 2024-03-16 DIAGNOSIS — D630 Anemia in neoplastic disease: Secondary | ICD-10-CM | POA: Diagnosis not present

## 2024-03-20 DIAGNOSIS — Z7952 Long term (current) use of systemic steroids: Secondary | ICD-10-CM | POA: Diagnosis not present

## 2024-03-20 DIAGNOSIS — C7951 Secondary malignant neoplasm of bone: Secondary | ICD-10-CM | POA: Diagnosis not present

## 2024-03-20 DIAGNOSIS — E871 Hypo-osmolality and hyponatremia: Secondary | ICD-10-CM | POA: Diagnosis not present

## 2024-03-20 DIAGNOSIS — Z87891 Personal history of nicotine dependence: Secondary | ICD-10-CM | POA: Diagnosis not present

## 2024-03-20 DIAGNOSIS — K5900 Constipation, unspecified: Secondary | ICD-10-CM | POA: Diagnosis not present

## 2024-03-20 DIAGNOSIS — C3491 Malignant neoplasm of unspecified part of right bronchus or lung: Secondary | ICD-10-CM | POA: Diagnosis not present

## 2024-03-20 DIAGNOSIS — M6281 Muscle weakness (generalized): Secondary | ICD-10-CM | POA: Diagnosis not present

## 2024-03-20 DIAGNOSIS — Z9181 History of falling: Secondary | ICD-10-CM | POA: Diagnosis not present

## 2024-03-20 DIAGNOSIS — J189 Pneumonia, unspecified organism: Secondary | ICD-10-CM | POA: Diagnosis not present

## 2024-03-20 DIAGNOSIS — K219 Gastro-esophageal reflux disease without esophagitis: Secondary | ICD-10-CM | POA: Diagnosis not present

## 2024-03-20 DIAGNOSIS — I700 Atherosclerosis of aorta: Secondary | ICD-10-CM | POA: Diagnosis not present

## 2024-03-20 DIAGNOSIS — E860 Dehydration: Secondary | ICD-10-CM | POA: Diagnosis not present

## 2024-03-20 DIAGNOSIS — J9601 Acute respiratory failure with hypoxia: Secondary | ICD-10-CM | POA: Diagnosis not present

## 2024-03-20 DIAGNOSIS — M8458XD Pathological fracture in neoplastic disease, other specified site, subsequent encounter for fracture with routine healing: Secondary | ICD-10-CM | POA: Diagnosis not present

## 2024-03-20 DIAGNOSIS — C187 Malignant neoplasm of sigmoid colon: Secondary | ICD-10-CM | POA: Diagnosis not present

## 2024-03-20 DIAGNOSIS — E876 Hypokalemia: Secondary | ICD-10-CM | POA: Diagnosis not present

## 2024-03-20 DIAGNOSIS — I82441 Acute embolism and thrombosis of right tibial vein: Secondary | ICD-10-CM | POA: Diagnosis not present

## 2024-03-20 DIAGNOSIS — C786 Secondary malignant neoplasm of retroperitoneum and peritoneum: Secondary | ICD-10-CM | POA: Diagnosis not present

## 2024-03-20 DIAGNOSIS — Z7901 Long term (current) use of anticoagulants: Secondary | ICD-10-CM | POA: Diagnosis not present

## 2024-03-20 DIAGNOSIS — K589 Irritable bowel syndrome without diarrhea: Secondary | ICD-10-CM | POA: Diagnosis not present

## 2024-03-20 DIAGNOSIS — E785 Hyperlipidemia, unspecified: Secondary | ICD-10-CM | POA: Diagnosis not present

## 2024-03-20 DIAGNOSIS — Z90710 Acquired absence of both cervix and uterus: Secondary | ICD-10-CM | POA: Diagnosis not present

## 2024-03-20 DIAGNOSIS — D630 Anemia in neoplastic disease: Secondary | ICD-10-CM | POA: Diagnosis not present

## 2024-03-20 DIAGNOSIS — Z792 Long term (current) use of antibiotics: Secondary | ICD-10-CM | POA: Diagnosis not present

## 2024-03-20 DIAGNOSIS — N1330 Unspecified hydronephrosis: Secondary | ICD-10-CM | POA: Diagnosis not present

## 2024-03-21 ENCOUNTER — Inpatient Hospital Stay

## 2024-03-21 ENCOUNTER — Encounter: Payer: Self-pay | Admitting: *Deleted

## 2024-03-21 ENCOUNTER — Encounter: Payer: Self-pay | Admitting: Hematology & Oncology

## 2024-03-21 ENCOUNTER — Inpatient Hospital Stay: Attending: Hematology & Oncology

## 2024-03-21 ENCOUNTER — Inpatient Hospital Stay: Admitting: Hematology & Oncology

## 2024-03-21 VITALS — BP 138/86 | HR 72 | Temp 98.2°F | Resp 17 | Ht 62.0 in | Wt 107.0 lb

## 2024-03-21 VITALS — BP 118/67 | HR 74 | Resp 17

## 2024-03-21 DIAGNOSIS — C3491 Malignant neoplasm of unspecified part of right bronchus or lung: Secondary | ICD-10-CM | POA: Insufficient documentation

## 2024-03-21 DIAGNOSIS — C7951 Secondary malignant neoplasm of bone: Secondary | ICD-10-CM | POA: Diagnosis not present

## 2024-03-21 DIAGNOSIS — C786 Secondary malignant neoplasm of retroperitoneum and peritoneum: Secondary | ICD-10-CM | POA: Diagnosis not present

## 2024-03-21 DIAGNOSIS — Z79620 Long term (current) use of immunosuppressive biologic: Secondary | ICD-10-CM | POA: Diagnosis not present

## 2024-03-21 DIAGNOSIS — Z5112 Encounter for antineoplastic immunotherapy: Secondary | ICD-10-CM | POA: Diagnosis not present

## 2024-03-21 DIAGNOSIS — Z87891 Personal history of nicotine dependence: Secondary | ICD-10-CM | POA: Insufficient documentation

## 2024-03-21 DIAGNOSIS — Z5111 Encounter for antineoplastic chemotherapy: Secondary | ICD-10-CM | POA: Insufficient documentation

## 2024-03-21 LAB — CMP (CANCER CENTER ONLY)
ALT: 57 U/L — ABNORMAL HIGH (ref 0–44)
AST: 22 U/L (ref 15–41)
Albumin: 3.8 g/dL (ref 3.5–5.0)
Alkaline Phosphatase: 84 U/L (ref 38–126)
Anion gap: 9 (ref 5–15)
BUN: 35 mg/dL — ABNORMAL HIGH (ref 8–23)
CO2: 26 mmol/L (ref 22–32)
Calcium: 9.4 mg/dL (ref 8.9–10.3)
Chloride: 104 mmol/L (ref 98–111)
Creatinine: 0.85 mg/dL (ref 0.44–1.00)
GFR, Estimated: >60 mL/min (ref >60)
Glucose, Bld: 103 mg/dL — ABNORMAL HIGH (ref 70–99)
Potassium: 4.4 mmol/L (ref 3.5–5.1)
Sodium: 139 mmol/L (ref 135–145)
Total Bilirubin: 0.6 mg/dL (ref 0.0–1.2)
Total Protein: 6.4 g/dL — ABNORMAL LOW (ref 6.5–8.1)

## 2024-03-21 LAB — CBC WITH DIFFERENTIAL (CANCER CENTER ONLY)
Abs Immature Granulocytes: 0.23 K/uL — ABNORMAL HIGH (ref 0.00–0.07)
Basophils Absolute: 0 K/uL (ref 0.0–0.1)
Basophils Relative: 0 %
Eosinophils Absolute: 0 K/uL (ref 0.0–0.5)
Eosinophils Relative: 0 %
HCT: 43.2 % (ref 36.0–46.0)
Hemoglobin: 13.4 g/dL (ref 12.0–15.0)
Immature Granulocytes: 1 %
Lymphocytes Relative: 3 %
Lymphs Abs: 0.6 K/uL — ABNORMAL LOW (ref 0.7–4.0)
MCH: 28.5 pg (ref 26.0–34.0)
MCHC: 31 g/dL (ref 30.0–36.0)
MCV: 91.7 fL (ref 80.0–100.0)
Monocytes Absolute: 0.9 K/uL (ref 0.1–1.0)
Monocytes Relative: 5 %
Neutro Abs: 15.7 K/uL — ABNORMAL HIGH (ref 1.7–7.7)
Neutrophils Relative %: 91 %
Platelet Count: 182 K/uL (ref 150–400)
RBC: 4.71 MIL/uL (ref 3.87–5.11)
RDW: 17.5 % — ABNORMAL HIGH (ref 11.5–15.5)
WBC Count: 17.5 K/uL — ABNORMAL HIGH (ref 4.0–10.5)
nRBC: 0 % (ref 0.0–0.2)

## 2024-03-21 LAB — TSH: TSH: 0.138 u[IU]/mL — ABNORMAL LOW (ref 0.350–4.500)

## 2024-03-21 LAB — LACTATE DEHYDROGENASE: LDH: 299 U/L — ABNORMAL HIGH (ref 98–192)

## 2024-03-21 MED ORDER — DEXAMETHASONE SODIUM PHOSPHATE 10 MG/ML IJ SOLN
10.0000 mg | Freq: Once | INTRAMUSCULAR | Status: AC
  Administered 2024-03-21: 10 mg via INTRAVENOUS
  Filled 2024-03-21: qty 1

## 2024-03-21 MED ORDER — PALONOSETRON HCL INJECTION 0.25 MG/5ML
0.2500 mg | Freq: Once | INTRAVENOUS | Status: AC
  Administered 2024-03-21: 0.25 mg via INTRAVENOUS
  Filled 2024-03-21: qty 5

## 2024-03-21 MED ORDER — SODIUM CHLORIDE 0.9 % IV SOLN
341.0000 mg | Freq: Once | INTRAVENOUS | Status: AC
  Administered 2024-03-21: 340 mg via INTRAVENOUS
  Filled 2024-03-21: qty 34

## 2024-03-21 MED ORDER — SODIUM CHLORIDE 0.9 % IV SOLN
500.0000 mg/m2 | Freq: Once | INTRAVENOUS | Status: AC
  Administered 2024-03-21: 700 mg via INTRAVENOUS
  Filled 2024-03-21: qty 20

## 2024-03-21 MED ORDER — SODIUM CHLORIDE 0.9% FLUSH
10.0000 mL | Freq: Once | INTRAVENOUS | Status: AC
  Administered 2024-03-21: 10 mL via INTRAVENOUS

## 2024-03-21 MED ORDER — APREPITANT 130 MG/18ML IV EMUL
130.0000 mg | Freq: Once | INTRAVENOUS | Status: AC
  Administered 2024-03-21: 130 mg via INTRAVENOUS
  Filled 2024-03-21: qty 18

## 2024-03-21 MED ORDER — SODIUM CHLORIDE 0.9 % IV SOLN
200.0000 mg | Freq: Once | INTRAVENOUS | Status: AC
  Administered 2024-03-21: 200 mg via INTRAVENOUS
  Filled 2024-03-21: qty 8

## 2024-03-21 MED ORDER — HEPARIN SOD (PORK) LOCK FLUSH 100 UNIT/ML IV SOLN
500.0000 [IU] | Freq: Once | INTRAVENOUS | Status: AC
  Administered 2024-03-21: 500 [IU] via INTRAVENOUS

## 2024-03-21 MED ORDER — SODIUM CHLORIDE 0.9 % IV SOLN
INTRAVENOUS | Status: DC
Start: 2024-03-21 — End: 2024-03-21

## 2024-03-21 NOTE — Progress Notes (Unsigned)
 Visited with patient in the treatment room. She is very anxious about starting treatment today. She says she is a very anxious person and she knows she will feel better once things get started. Her husband comes with her for support today. After the provider appointment, we will have time with the RN Educator for chemo ed.   Oncology Nurse Navigator Documentation     03/21/2024   10:00 AM  Oncology Nurse Navigator Flowsheets  Phase of Treatment Chemotherapy  Chemotherapy Actual Start Date: 03/21/2024  Navigator Follow Up Date: 04/12/2024  Navigator Follow Up Reason: Follow-up Appointment;Chemotherapy  Navigator Location [facility]  Navigator Encounter Type Treatment  Treatment Initiated Date 03/21/2024  Patient Visit Type MedOnc  Treatment Phase First Chemo Tx;Active Tx  Barriers/Navigation Needs Coordination of Care;Education  Education Pain/ Symptom Management;Preparing for Upcoming Surgery/ Treatment  Interventions Education;Psycho-Social Support  Acuity Level 2-Minimal Needs (1-2 Barriers Identified)  Education Method Verbal  Support Groups/Services Friends and Family  Time Spent with Patient 30

## 2024-03-21 NOTE — Patient Instructions (Signed)
 [facility] - A DEPT OF [facility]  Discharge Instructions: Thank you for choosing [facility] to provide your oncology and hematology care.   If you have a lab appointment with the Cancer Center, please go directly to the Cancer Center and check in at the registration area.  Wear comfortable clothing and clothing appropriate for easy access to any Portacath or PICC line.   We strive to give you quality time with your provider. You may need to reschedule your appointment if you arrive late (15 or more minutes).  Arriving late affects you and other patients whose appointments are after yours.  Also, if you miss three or more appointments without notifying the office, you may be dismissed from the clinic at the provider's discretion.      For prescription refill requests, have your pharmacy contact our office and allow 72 hours for refills to be completed.    Today you received the following chemotherapy and/or immunotherapy agents Carboplatin , Alimta , Keytruda       To help prevent nausea and vomiting after your treatment, we encourage you to take your nausea medication as directed.  BELOW ARE SYMPTOMS THAT SHOULD BE REPORTED IMMEDIATELY: *FEVER GREATER THAN 100.4 F (38 C) OR HIGHER *CHILLS OR SWEATING *NAUSEA AND VOMITING THAT IS NOT CONTROLLED WITH YOUR NAUSEA MEDICATION *UNUSUAL SHORTNESS OF BREATH *UNUSUAL BRUISING OR BLEEDING *URINARY PROBLEMS (pain or burning when urinating, or frequent urination) *BOWEL PROBLEMS (unusual diarrhea, constipation, pain near the anus) TENDERNESS IN MOUTH AND THROAT WITH OR WITHOUT PRESENCE OF ULCERS (sore throat, sores in mouth, or a toothache) UNUSUAL RASH, SWELLING OR PAIN  UNUSUAL VAGINAL DISCHARGE OR ITCHING   Items with * indicate a potential emergency and should be followed up as soon as possible or go to the Emergency Department if any problems should occur.  Please show the CHEMOTHERAPY ALERT CARD  or IMMUNOTHERAPY ALERT CARD at check-in to the Emergency Department and triage nurse. Should you have questions after your visit or need to cancel or reschedule your appointment, please contact [facility] - A DEPT OF [facility]  [phone] and follow the prompts.  Office hours are 8:00 a.m. to 4:30 p.m. Monday - Friday. Please note that voicemails left after 4:00 p.m. may not be returned until the following business day.  We are closed weekends and major holidays. You have access to a nurse at all times for urgent questions. Please call the main number to the clinic [phone] and follow the prompts.  For any non-urgent questions, you may also contact your provider using MyChart. We now offer e-Visits for anyone 61 and older to request care online for non-urgent symptoms. For details visit [email or web address].   Also download the MyChart app! Go to the app store, search MyChart, open the app, select [facility], and log in with your MyChart username and password.

## 2024-03-21 NOTE — Progress Notes (Signed)
 Hematology and Oncology Follow Up Visit  Regina Young [id number] June 14, 1956 68 y.o. 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  Regina Young received 5000 rad to the lung and 3000 rad to the spine. Xgeva 120 mg subcu every 3 months-next dose 06/2024 Carbo/Alimta /Keytruda  -- start cycle #1 on 03/21/2024 Eliquis  5 mg p.o. twice daily --start 02/01/2024     Interim History:  Regina Young is in for follow-up.  Regina Young was recently in the hospital for pneumonia.  Regina Young had a very thorough workup.  There is no evidence of pulmonary embolism.  There is no evidence of progressive lung cancer..  Regina Young was treated with antibiotics.  Regina Young got nebulizers.  Regina Young was given a very prolonged steroid taper.  I readjusted the steroid taper to finish much more quickly.  Regina Young is now ready for chemotherapy.  We were supposed to start few weeks ago but again Regina Young had this pneumonia.  Regina Young still has some discomfort down in the lower back.  This was where the tumor was invading the sacrum.  Regina Young has had a good appetite.  I am sure the steroids have helped with this..  Regina Young has had no problems with nausea or vomiting.  Regina Young has had no headache..  Regina Young does have a little bit of fullness down in the right upper sacral area.  I told Regina Young this certainly could be from the tumor itself.  Regina Young has had radiotherapy down to the sacrum.  Regina Young completed this about a month ago.  Regina Young also had radiotherapy to the lung  I need to make sure that Regina Young gets Regina Young Xgeva today.  Currently, I would have to say that Regina Young performance status is ECOG 1.   Medications:  Current Outpatient Medications:    alendronate  (FOSAMAX ) 70 MG tablet, Take 70 mg by mouth once a week. Take 70 mg  every Sunday by mouth 30 minutes before the first food, beverage or medicine of the day with plain water, Disp: , Rfl:    apixaban  (ELIQUIS ) 5 MG TABS tablet, Take 1 tablet (5 mg total) by mouth 2 (two) times daily., Disp: 60 tablet, Rfl: 3   Calcium  Citrate 250 MG TABS, Take 250 mg by mouth in the morning., Disp: , Rfl:    Cholecalciferol  (VITAMIN D3) 50 MCG (2000 UT) TABS, Take 2,000 Units by mouth daily with breakfast., Disp: , Rfl:    cyanocobalamin  (VITAMIN B12) 1000 MCG tablet, Take 1,000 mcg by mouth daily., Disp: , Rfl:    dexamethasone  (DECADRON ) 4 MG tablet, Take 1 tab 2 times daily starting day before pemetrexed . Then take 2 tabs daily x 3 days starting day after carboplatin . Take with food., Disp: 30 tablet, Rfl: 1   fluticasone  (FLONASE ) 50 MCG/ACT nasal spray, Place 2 sprays into both nostrils daily., Disp: 16 g, Rfl: 0   folic acid  (FOLVITE ) 1 MG tablet, Take 1 tablet (1 mg total) by mouth daily. Start 7 days before pemetrexed  chemotherapy. Continue until 21 days after pemetrexed  completed., Disp: 100 tablet, Rfl: 3   gabapentin  (NEURONTIN ) 300 MG capsule, Take 1 capsule (300 mg total) by mouth 3 (three) times daily., Disp: 90 capsule, Rfl: 1   ipratropium-albuterol  (DUONEB) 0.5-2.5 (3) MG/3ML SOLN, Take 3 mLs by nebulization 3 (three) times daily for  7 days, THEN 3 mLs every 6 (six) hours as needed., Disp: 360 mL, Rfl: 1   metoprolol  tartrate (LOPRESSOR ) 25 MG tablet, Take 1 tablet (25 mg total) by mouth 2 (two) times daily., Disp: 60 tablet, Rfl: 1   morphine  (MS CONTIN ) 15 MG 12 hr tablet, Take 1 tablet (15 mg total) by mouth every 12 (twelve) hours., Disp: 60 tablet, Rfl: 0   omeprazole  (PRILOSEC ) 20 MG capsule, Take 2 capsules (40 mg total) by mouth daily before breakfast., Disp: 60 capsule, Rfl: 1   ondansetron  (ZOFRAN ) 4 MG tablet, Take 1 tablet (4 mg total) by mouth every 6 (six) hours as needed for nausea., Disp: 20 tablet, Rfl: 0   ondansetron  (ZOFRAN ) 8 MG tablet, Take 1  tablet (8 mg total) by mouth every 8 (eight) hours as needed for nausea or vomiting. Start on the third day after carboplatin ., Disp: 30 tablet, Rfl: 1   oxyCODONE  (OXY IR/ROXICODONE ) 5 MG immediate release tablet, Take one to two tablets every six hours as needed for pain, Disp: 90 tablet, Rfl: 0   predniSONE  (DELTASONE ) 10 MG tablet, Take 4 tablets (40 mg total) by mouth daily with breakfast for 14 days, THEN 3 tablets (30 mg total) daily with breakfast for 14 days, THEN 2 tablets (20 mg total) daily with breakfast for 14 days, THEN 1 tablet (10 mg total) daily with breakfast for 14 days., Disp: 140 tablet, Rfl: 0   prochlorperazine  (COMPAZINE ) 10 MG tablet, Take 1 tablet (10 mg total) by mouth every 6 (six) hours as needed for nausea or vomiting., Disp: 30 tablet, Rfl: 1   rosuvastatin  (CRESTOR ) 5 MG tablet, Take 5 mg by mouth in the morning., Disp: , Rfl:    sulfamethoxazole -trimethoprim  (BACTRIM  DS) 800-160 MG tablet, Take 1 tablet by mouth 3 (three) times a week., Disp: 12 tablet, Rfl: 1 No current facility-administered medications for this visit.  Facility-Administered Medications Ordered in Other Visits:    0.9 %  sodium chloride  infusion, , Intravenous, Continuous, Ivie Savitt, Maude SAUNDERS, MD, Stopped at 03/21/24 1232  Allergies: No Known Allergies  Past Medical History, Surgical history, Social history, and Family History were reviewed and updated.  Review of Systems: Review of Systems  Constitutional: Negative.   HENT:  Negative.    Eyes: Negative.   Respiratory: Negative.    Cardiovascular: Negative.   Gastrointestinal:  Positive for abdominal pain.  Endocrine: Negative.   Genitourinary: Negative.    Musculoskeletal:  Positive for back pain.  Neurological: Negative.   Hematological: Negative.   Psychiatric/Behavioral: Negative.      Physical Exam:  height is 5' 2 (1.575 m) and weight is 107 lb (48.5 kg). Regina Young oral temperature is 98.2 F (36.8 C). Regina Young blood pressure is 138/86 and  Regina Young pulse is 72. Regina Young respiration is 17 and oxygen saturation is 96%.   Wt Readings from Last 3 Encounters:  03/21/24 107 lb (48.5 kg)  02/28/24 107 lb 2.3 oz (48.6 kg)  02/22/24 110 lb (49.9 kg)    Physical Exam Vitals reviewed.  HENT:     Head: Normocephalic and atraumatic.  Eyes:     Pupils: Pupils are equal, round, and reactive to light.  Cardiovascular:     Rate and Rhythm: Normal rate and regular rhythm.     Heart sounds: Normal heart sounds.  Pulmonary:     Effort: Pulmonary effort is normal.     Breath sounds: Normal breath sounds.  Abdominal:     General: Bowel sounds are normal.  Palpations: Abdomen is soft.  Musculoskeletal:        General: No tenderness or deformity. Normal range of motion.     Cervical back: Normal range of motion.  Lymphadenopathy:     Cervical: No cervical adenopathy.  Skin:    General: Skin is warm and dry.     Findings: No erythema or rash.  Neurological:     Mental Status: Regina Young is alert and oriented to person, place, and time.  Psychiatric:        Behavior: Behavior normal.        Thought Content: Thought content normal.        Judgment: Judgment normal.      Lab Results  Component Value Date   WBC 17.5 (H) 03/21/2024   HGB 13.4 03/21/2024   HCT 43.2 03/21/2024   MCV 91.7 03/21/2024   PLT 182 03/21/2024     Chemistry      Component Value Date/Time   NA 139 03/21/2024 0950   K 4.4 03/21/2024 0950   CL 104 03/21/2024 0950   CO2 26 03/21/2024 0950   BUN 35 (H) 03/21/2024 0950   CREATININE 0.85 03/21/2024 0950      Component Value Date/Time   CALCIUM  9.4 03/21/2024 0950   ALKPHOS 84 03/21/2024 0950   AST 22 03/21/2024 0950   ALT 57 (H) 03/21/2024 0950   BILITOT 0.6 03/21/2024 0950      Impression and Plan: Regina Young is a very charming 68 year old white female.  Regina Young has a past history of an early stage colon cancer.  However, now it looks like Regina Young has a metastatic non-small cell lung cancer-adenocarcinoma.  Regina Young  completed the radiotherapy for the sacral metastasis and the lung primary.  I think we can now start Regina Young treatment.  I think Regina Young has recovered from this pneumonia.  I will have to make sure that Regina Young gets Regina Young Xgeva also.  Again, I wrote down the steroid taper for Regina Young so Regina Young finishes more quickly.  Again, we will try for 3 treatments and then repeat Regina Young PET scan and see everything looks.  Regina Young is very tough.  Regina Young is very petite but has a lot of strength.  Regina Young husband is incredibly supportive.  I am so glad that Regina Young has seemed to be able to help Regina Young.  We will plan to get Regina Young back in 3 weeks.   Maude JONELLE Crease, MD 7/15/20255:05 PM

## 2024-03-21 NOTE — Progress Notes (Signed)
 Patient in chemotherapy education class with husband Koren prior to beginning treatment in infusion room.  Discussed side effects of Carboplatin , Alimta , Keytruda                    which include but are not limited to myelosuppression, decreased appetite, fatigue, fever, allergic or infusional reaction, mucositis, cardiac toxicity, cough, SOB, altered taste, nausea and vomiting, diarrhea, constipation, elevated LFTs myalgia and arthralgias, hair loss or thinning, rash, skin dryness, nail changes, peripheral neuropathy, discolored urine, delayed wound healing, mental changes (Chemo brain), increased risk of infections, weight loss.  Reviewed side effects specific to immunotherapy such as colitis, hypothyroidism, pneumonitis, skin rash, elevated liver enzymes, kidney issues.  Reviewed infusion room and office policy and procedure and phone numbers 24 hours x 7 days a week.  Reviewed when to call the office with any concerns or problems.  Transport planner given.  Antiemetic protocol and chemotherapy schedule reviewed. Patient verbalized understanding of chemotherapy indications and possible side effects.  Teachback done

## 2024-03-22 ENCOUNTER — Encounter: Payer: Self-pay | Admitting: Hematology & Oncology

## 2024-03-22 LAB — T4: T4, Total: 8.1 ug/dL (ref 4.5–12.0)

## 2024-03-22 NOTE — Progress Notes (Signed)
 Radiation Oncology         [phone]) [phone] ________________________________  Name: Regina Young MRN: [id number]  Date: 03/23/2024  DOB: Aug 22, 1956  Follow-Up Visit Note  CC: Dwight Trula SQUIBB, MD  Dwight Trula SQUIBB, MD  No diagnosis found.  Diagnosis:  Stage IV (cT1b, cN0, M1) adenocarcinoma, NSCLC, of the right lung with spinal/sacral metastasis   Interval Since Last Radiation:  1 month 12 month  Intent: Palliative  First Treatment Date: 2024-01-20 Last Treatment Date: 2024-02-10   Plan Name: Lung_Rt_UHRT Site: Lung, Right Technique: IMRT Mode: Photon Dose Per Fraction: 5 Gy Prescribed Dose (Delivered / Prescribed): 50 Gy / 50 Gy Prescribed Fxs (Delivered / Prescribed): 10 / 10   Plan Name: Spine Site: Lumbar Spine Technique: 3D Mode: Photon Dose Per Fraction: 3 Gy Prescribed Dose (Delivered / Prescribed): 30 Gy / 30 Gy Prescribed Fxs (Delivered / Prescribed): 10 / 10  Narrative:  The patient returns today for routine follow-up. She was last seen in office on 01/17/24 for her consultation visit. Since then, patient completed her radiation treatment which she tolerated quite well. Patient did however endorse experiencing  right lower extremity pain throughout her treatment.   In the interval since she was last seen, she continued to follow up with Dr. Timmy. She was started on systemic chemotherapy with Carbo/Alimta /Keytrud on 03/01/24. Chemotherapy was delayed due to hospitalization. First cycle was administered on 03/21/24.    Patient presented to the ED on 02/15/24 complaining of leg pain and swelling. Workup was unremarkable for any significant findings. She was treated with iohexol  injection and a short course of lasix . She returned to the ED on 02/28/24 and was hospitalized till 03/10/24 for acute hypoxic respiratory failure due to pneumonia. Chest x-ray without evidence of worsening mass, effusion or other abnormality. Chest radiograph with bilateral upper lobe opacities, no  evidence of sepsis. CT a/p with no evidence of gallstones or ductal dilatation. She was treated with several rounds of antibiotics and continued to be monitored.       No other significant oncologic interval history since the patient was last seen.                         Allergies:  has no known allergies.  Meds: Current Outpatient Medications  Medication Sig Dispense Refill   alendronate  (FOSAMAX ) 70 MG tablet Take 70 mg by mouth once a week. Take 70 mg every Sunday by mouth 30 minutes before the first food, beverage or medicine of the day with plain water     apixaban  (ELIQUIS ) 5 MG TABS tablet Take 1 tablet (5 mg total) by mouth 2 (two) times daily. 60 tablet 3   Calcium  Citrate 250 MG TABS Take 250 mg by mouth in the morning.     Cholecalciferol  (VITAMIN D3) 50 MCG (2000 UT) TABS Take 2,000 Units by mouth daily with breakfast.     cyanocobalamin  (VITAMIN B12) 1000 MCG tablet Take 1,000 mcg by mouth daily.     dexamethasone  (DECADRON ) 4 MG tablet Take 1 tab 2 times daily starting day before pemetrexed . Then take 2 tabs daily x 3 days starting day after carboplatin . Take with food. 30 tablet 1   fluticasone  (FLONASE ) 50 MCG/ACT nasal spray Place 2 sprays into both nostrils daily. 16 g 0   folic acid  (FOLVITE ) 1 MG tablet Take 1 tablet (1 mg total) by mouth daily. Start 7 days before pemetrexed  chemotherapy. Continue until 21 days after pemetrexed  completed. 100  tablet 3   gabapentin  (NEURONTIN ) 300 MG capsule Take 1 capsule (300 mg total) by mouth 3 (three) times daily. 90 capsule 1   ipratropium-albuterol  (DUONEB) 0.5-2.5 (3) MG/3ML SOLN Take 3 mLs by nebulization 3 (three) times daily for 7 days, THEN 3 mLs every 6 (six) hours as needed. 360 mL 1   metoprolol  tartrate (LOPRESSOR ) 25 MG tablet Take 1 tablet (25 mg total) by mouth 2 (two) times daily. 60 tablet 1   morphine  (MS CONTIN ) 15 MG 12 hr tablet Take 1 tablet (15 mg total) by mouth every 12 (twelve) hours. 60 tablet 0   omeprazole   (PRILOSEC ) 20 MG capsule Take 2 capsules (40 mg total) by mouth daily before breakfast. 60 capsule 1   ondansetron  (ZOFRAN ) 4 MG tablet Take 1 tablet (4 mg total) by mouth every 6 (six) hours as needed for nausea. 20 tablet 0   ondansetron  (ZOFRAN ) 8 MG tablet Take 1 tablet (8 mg total) by mouth every 8 (eight) hours as needed for nausea or vomiting. Start on the third day after carboplatin . 30 tablet 1   oxyCODONE  (OXY IR/ROXICODONE ) 5 MG immediate release tablet Take one to two tablets every six hours as needed for pain 90 tablet 0   predniSONE  (DELTASONE ) 10 MG tablet Take 4 tablets (40 mg total) by mouth daily with breakfast for 14 days, THEN 3 tablets (30 mg total) daily with breakfast for 14 days, THEN 2 tablets (20 mg total) daily with breakfast for 14 days, THEN 1 tablet (10 mg total) daily with breakfast for 14 days. 140 tablet 0   prochlorperazine  (COMPAZINE ) 10 MG tablet Take 1 tablet (10 mg total) by mouth every 6 (six) hours as needed for nausea or vomiting. 30 tablet 1   rosuvastatin  (CRESTOR ) 5 MG tablet Take 5 mg by mouth in the morning.     sulfamethoxazole -trimethoprim  (BACTRIM  DS) 800-160 MG tablet Take 1 tablet by mouth 3 (three) times a week. 12 tablet 1   No current facility-administered medications for this encounter.    Physical Findings: The patient is in no acute distress. Patient is alert and oriented.  vitals were not taken for this visit. .  No significant changes. Lungs are clear to auscultation bilaterally. Heart has regular rate and rhythm. No palpable cervical, supraclavicular, or axillary adenopathy. Abdomen soft, non-tender, normal bowel sounds.   Lab Findings: Lab Results  Component Value Date   WBC 17.5 (H) 03/21/2024   HGB 13.4 03/21/2024   HCT 43.2 03/21/2024   MCV 91.7 03/21/2024   PLT 182 03/21/2024    Radiographic Findings: US  Abdomen Limited RUQ (LIVER/GB) Result Date: 03/06/2024 CLINICAL DATA:  Elevated liver function tests EXAM: ULTRASOUND  ABDOMEN LIMITED RIGHT UPPER QUADRANT COMPARISON:  CT 02/28/2024.  renal ultrasound 01/13/2024. FINDINGS: Gallbladder: Distended gallbladder. No shadowing stones. No wall thickening or adjacent fluid. Common bile duct: Diameter: 5 mm Liver: Preserved hepatic echotexture. Small renal cysts are identified which are anechoic with through transmission measuring up to 18 mm. Portal vein is patent on color Doppler imaging with normal direction of blood flow towards the liver. Other: Incidental note is made of collecting system dilatation of the right kidney which is severe. Please correlate with CT scan from 02/28/2024. IMPRESSION: No gallstones or ductal dilatation. Hepatic cysts. 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 Please correlate with separate CT scan from 02/28/2024. Electronically Signed   By: Ranell Bring M.D.   On: 03/06/2024 18:02   CT CHEST WO CONTRAST Result Date: 03/04/2024  CLINICAL DATA:  Evaluate for pneumonia. History of rectal carcinoma. EXAM: CT CHEST WITHOUT CONTRAST TECHNIQUE: Multidetector CT imaging of the chest was performed following the standard protocol without IV contrast. RADIATION DOSE REDUCTION: This exam was performed according to the departmental dose-optimization program which includes automated exposure control, adjustment of the mA and/or kV according to patient size and/or use of iterative reconstruction technique. COMPARISON:  CT angio chest from 03/02/2024 FINDINGS: Cardiovascular: Heart size is within normal limits. Right chest wall port a catheter is noted with catheter tip at the superior cavoatrial junction. No pericardial effusion. Coronary artery atherosclerotic calcifications. Mediastinum/Nodes: Thyroid gland, trachea, and esophagus appear normal. No enlarged supraclavicular, axillary or mediastinal lymph nodes. Hilar lymph nodes suboptimally evaluated due to lack of IV contrast. Lungs/Pleura: The central airways are patent. Persistent and slightly  increased pleural fluid overlying the posterior and lateral right lower lobe. Overlying progressive airspace consolidation is identified. The lungs appear similar appearance of diffuse increase interstitial thickening and ground-glass attenuation within upper lung zone predominance. As noted previously there is some mild peripheral consolidative change within the anterior right upper lobe. Within the basilar right upper lobe there is a 4 mm lung nodule, similar to previous exam, image 69/8. Upper Abdomen: Severe right hydronephrosis is again noted as seen on the CT from 03/02/2024. Unchanged. Similar appearance of low-attenuation splenic lesions. Musculoskeletal: No acute or suspicious osseous findings. IMPRESSION: 1. Persistent and slightly increased pleural fluid overlying the posterior and lateral right lower lobe. Overlying progressive airspace consolidation is identified. Findings are concerning for progressive pneumonia. 2. Similar appearance of diffuse increase interstitial thickening and ground-glass attenuation within upper lung zone predominance. Findings are nonspecific and may reflect pulmonary edema or atypical infection. Hypersensitivity pneumonitis in the setting of ongoing chemotherapy is also a differential consideration. 3. Stable appearance of 4 mm lung nodule within the basilar right upper lobe. Attention on follow-up imaging recommended. 4. Stable appearance of severe right hydronephrosis as seen on the CT from 03/02/2024. 5.  Aortic Atherosclerosis (ICD10-I70.0). Electronically Signed   By: Waddell Calk M.D.   On: 03/04/2024 10:37   CT Angio Chest Pulmonary Embolism (PE) W or WO Contrast Result Date: 03/02/2024 CLINICAL DATA:  High probability for PE.  History of rectal cancer. EXAM: CT ANGIOGRAPHY CHEST WITH CONTRAST TECHNIQUE: Multidetector CT imaging of the chest was performed using the standard protocol during bolus administration of intravenous contrast. Multiplanar CT image  reconstructions and MIPs were obtained to evaluate the vascular anatomy. RADIATION DOSE REDUCTION: This exam was performed according to the departmental dose-optimization program which includes automated exposure control, adjustment of the mA and/or kV according to patient size and/or use of iterative reconstruction technique. CONTRAST:  75mL OMNIPAQUE  IOHEXOL  350 MG/ML SOLN COMPARISON:  CT chest 12/23/2023.  PET-CT 01/03/2024. FINDINGS: Cardiovascular: Satisfactory opacification of the pulmonary arteries to the segmental level. No evidence of pulmonary embolism. Normal heart size. No pericardial effusion. Right chest port catheter tip ends in the right atrium. Mediastinum/Nodes: Enlarged right hilar lymph node measuring 11 mm has increased in size. No other enlarged lymph nodes are seen. Visualized esophagus and thyroid gland are within normal limits. Lungs/Pleura: There is new multifocal and confluent areas of smooth interlobular septal thickening with superimposed ground-glass opacities are seen predominantly throughout the bilateral upper lobes and minimally in the right middle lobe and superior segment of the right lower lobe. There is a trace right pleural effusion with atelectasis in the right lung base. Right suprahilar nodular density appears grossly unchanged measuring 1.9  x 1.7 cm on coronal image 7/38. Upper Abdomen: Severe right hydronephrosis is partially imaged and unchanged. Low-attenuation lesions in the spleen are stable. Musculoskeletal: No chest wall abnormality. No acute or significant osseous findings. Review of the MIP images confirms the above findings. IMPRESSION: 1. No evidence for pulmonary embolism. 2. New multifocal and confluent areas of smooth interlobular septal thickening with superimposed ground-glass opacities predominantly throughout the bilateral upper lobes. Findings are nonspecific and may be related to pulmonary edema, ARDS, infection, or other inflammatory processes. 3. Trace  right pleural effusion. 4. Stable right suprahilar nodular density. 5. Enlarged right hilar lymph node has increased in size. 6. Stable severe right hydronephrosis. Electronically Signed   By: Greig Pique M.D.   On: 03/02/2024 18:29   DG CHEST PORT 1 VIEW Result Date: 03/01/2024 CLINICAL DATA:  Follow-up pneumonia. EXAM: PORTABLE CHEST 1 VIEW COMPARISON:  02/28/2024 FINDINGS: There is a right chest wall port a catheter with tip in the projection of the right atrium. Heart size and mediastinal contours are stable. Persistent bilateral upper lobe airspace opacities, unchanged from previous exam. No signs of pleural effusion or pneumothorax. Visualized osseous structures are unremarkable. IMPRESSION: Persistent bilateral upper lobe airspace opacities compatible with pneumonia. No significant change from previous exam. Electronically Signed   By: Waddell Calk M.D.   On: 03/01/2024 14:06   CT Head Wo Contrast Result Date: 02/28/2024 CLINICAL DATA:  Headache and right leg pain. EXAM: CT HEAD WITHOUT CONTRAST TECHNIQUE: Contiguous axial images were obtained from the base of the skull through the vertex without intravenous contrast. RADIATION DOSE REDUCTION: This exam was performed according to the departmental dose-optimization program which includes automated exposure control, adjustment of the mA and/or kV according to patient size and/or use of iterative reconstruction technique. COMPARISON:  December 22, 2023 FINDINGS: Brain: No evidence of acute infarction, hemorrhage, hydrocephalus, extra-axial collection or mass lesion/mass effect. Vascular: No hyperdense vessel or unexpected calcification. Skull: Normal. Negative for fracture or focal lesion. Sinuses/Orbits: No acute finding. Other: A small, stable partially calcified left posterior parietal scalp soft tissue nodule is seen. IMPRESSION: No acute intracranial abnormality. Electronically Signed   By: Suzen Dials M.D.   On: 02/28/2024 16:15   CT ABDOMEN  PELVIS W CONTRAST Result Date: 02/28/2024 CLINICAL DATA:  History of rectal cancer.  Abdominal pain and fever. EXAM: CT ABDOMEN AND PELVIS WITH CONTRAST TECHNIQUE: Multidetector CT imaging of the abdomen and pelvis was performed using the standard protocol following bolus administration of intravenous contrast. RADIATION DOSE REDUCTION: This exam was performed according to the departmental dose-optimization program which includes automated exposure control, adjustment of the mA and/or kV according to patient size and/or use of iterative reconstruction technique. CONTRAST:  OMNIPAQUE  IOHEXOL  300 MG/ML  SOLN COMPARISON:  CT venogram abdomen and pelvis 02/15/2024. FINDINGS: Lower chest: No acute abnormality. Hepatobiliary: There are scattered rounded hypodensities throughout the liver favored as cysts. These measure up to 19 mm and appear unchanged from the prior study. No new liver lesions are seen. Gallbladder and bile ducts are within normal limits. Pancreas: Unremarkable. No pancreatic ductal dilatation or surrounding inflammatory changes. Spleen: Rounded hypodensities in the spleen are unchanged, possibly cysts or hemangiomas. Spleen is normal in size. Adrenals/Urinary Tract: There is stable severe right-sided hydroureteronephrosis to the level of the pelvic inlet/surgical staples. The left kidney, adrenal glands and bladder are within normal limits. The bladder is markedly distended. Stomach/Bowel: There is no bowel obstruction, focal wall thickening, inflammation or free air identified. The appendix is  not visualized. Rectosigmoid anastomosis is present. There is a large amount of stool throughout the entire colon the stomach is within normal limits. Vascular/Lymphatic: Aortic atherosclerosis. No enlarged abdominal or pelvic lymph nodes. Reproductive: Status post hysterectomy. No adnexal masses. Other: There is no ascites or focal abdominal wall hernia. Musculoskeletal: There is a stable soft tissue mass  with osseous involvement the level of the right L5-S1 anteriorly. This measures approximally 5.5 x 2.7 cm. There is new pathologic fracture along the right superior endplate of S1. IMPRESSION: 1. New pathologic fracture along the right superior endplate of S1. 2. Stable soft tissue mass with osseous involvement at the level of the right L5-S1 disc space. 3. Stable severe right-sided hydroureteronephrosis to the level of the pelvic inlet/surgical staples. 4. Marked distention of the bladder. 5. Large amount of stool throughout the colon. 6. Aortic atherosclerosis. Aortic Atherosclerosis (ICD10-I70.0). Electronically Signed   By: Greig Pique M.D.   On: 02/28/2024 16:03   DG Chest Port 1 View Result Date: 02/28/2024 CLINICAL DATA:  Sepsis. EXAM: PORTABLE CHEST 1 VIEW COMPARISON:  Chest radiograph dated 01/04/2024. FINDINGS: Right-sided Port-A-Cath with tip close to the cavoatrial junction. Bilateral upper lobe opacities, new since the prior radiograph and may represent infiltrate or edema. No pleural effusion or pneumothorax. Stable cardiac silhouette. No acute osseous pathology. IMPRESSION: Bilateral upper lobe opacities, new since the prior radiograph and may represent infiltrate or edema. Electronically Signed   By: Vanetta Chou M.D.   On: 02/28/2024 12:54   IR IMAGING GUIDED PORT INSERTION Result Date: 02/22/2024 INDICATION: port for chemo history RIGHT lung cancer. EXAM: IMPLANTED PORT A CATH PLACEMENT WITH ULTRASOUND AND FLUOROSCOPIC GUIDANCE MEDICATIONS: None ANESTHESIA/SEDATION: Moderate (conscious) sedation was employed during this procedure. A total of Versed  2 mg and Fentanyl  50 mcg was administered intravenously. Moderate Sedation Time: 21 minutes. The patient's level of consciousness and vital signs were monitored continuously by radiology nursing throughout the procedure under my direct supervision. FLUOROSCOPY: Radiation Exposure Index and estimated peak skin dose (PSD); Reference air kerma  (RAK), 0.4 mGy. Kerma-area product (KAP), 5.39 uGy*m. COMPLICATIONS: None immediate. PROCEDURE: The procedure, risks, benefits, and alternatives were explained to the patient. Questions regarding the procedure were encouraged and answered. The patient understands and consents to the procedure. The RIGHT neck and chest were prepped with chlorhexidine  in a sterile fashion, and a sterile drape was applied covering the operative field. Maximum barrier sterile technique with sterile gowns and gloves were used for the procedure. A timeout was performed prior to the initiation of the procedure. Local anesthesia was provided with 1% lidocaine  with epinephrine . After creating a small venotomy incision, a micropuncture kit was utilized to access the internal jugular vein under direct, real-time ultrasound guidance. Ultrasound image documentation was performed. The microwire was kinked to measure appropriate catheter length. A subcutaneous port pocket was then created along the upper chest wall utilizing a combination of sharp and blunt dissection. The pocket was irrigated with sterile saline. A single lumen power injectable port was chosen for placement. The 8 Fr catheter was tunneled from the port pocket site to the venotomy incision. The port was placed in the pocket. The external catheter was trimmed to appropriate length. At the venotomy, an 8 Fr peel-away sheath was placed over a guidewire under fluoroscopic guidance. The catheter was then placed through the sheath and the sheath was removed. Final catheter positioning was confirmed and documented with a fluoroscopic spot radiograph. The port was accessed with a Huber needle, aspirated and flushed  with heparinized saline. The port pocket incision was closed with interrupted 3-0 Vicryl suture then Dermabond was applied, including at the venotomy incision. Dressings were placed. The patient tolerated the procedure well without immediate post procedural complication.  IMPRESSION: Successful placement of a RIGHT internal jugular approach power injectable Port-A-Cath. The tip of the catheter is positioned within the proximal RIGHT atrium. The catheter is ready for immediate use. Thom Hall, MD [facility] Radiology Electronically Signed   By: Thom Hall M.D.   On: 02/22/2024 13:08    Impression: Stage IV (cT1b, cN0, M1) adenocarcinoma, NSCLC, of the right lung with spinal/sacral metastasis   The patient is recovering from the effects of radiation.  ***  Plan:  ***   *** minutes of total time was spent for this patient encounter, including preparation, face-to-face counseling with the patient and coordination of care, physical exam, and documentation of the encounter. ____________________________________  Lynwood CHARM Nasuti, PhD, MD  This document serves as a record of services personally performed by Lynwood Nasuti, MD. It was created on his behalf by Reymundo Cartwright, a trained medical scribe. The creation of this record is based on the scribe's personal observations and the provider's statements to them. This document has been checked and approved by the attending provider.

## 2024-03-23 ENCOUNTER — Other Ambulatory Visit: Payer: Self-pay

## 2024-03-23 ENCOUNTER — Encounter: Payer: Self-pay | Admitting: Radiation Oncology

## 2024-03-23 ENCOUNTER — Ambulatory Visit
Admission: RE | Admit: 2024-03-23 | Discharge: 2024-03-23 | Disposition: A | Discharge status: Home / Self Care | Source: Ambulatory Visit | Attending: Radiation Oncology | Admitting: Radiation Oncology

## 2024-03-23 VITALS — BP 131/77 | HR 67 | Temp 97.1°F | Resp 18 | Ht 62.0 in | Wt 107.4 lb

## 2024-03-23 DIAGNOSIS — Z87891 Personal history of nicotine dependence: Secondary | ICD-10-CM | POA: Diagnosis not present

## 2024-03-23 DIAGNOSIS — Z90710 Acquired absence of both cervix and uterus: Secondary | ICD-10-CM | POA: Diagnosis not present

## 2024-03-23 DIAGNOSIS — J189 Pneumonia, unspecified organism: Secondary | ICD-10-CM | POA: Diagnosis not present

## 2024-03-23 DIAGNOSIS — K589 Irritable bowel syndrome without diarrhea: Secondary | ICD-10-CM | POA: Diagnosis not present

## 2024-03-23 DIAGNOSIS — N1330 Unspecified hydronephrosis: Secondary | ICD-10-CM | POA: Diagnosis not present

## 2024-03-23 DIAGNOSIS — K5900 Constipation, unspecified: Secondary | ICD-10-CM | POA: Diagnosis not present

## 2024-03-23 DIAGNOSIS — I82441 Acute embolism and thrombosis of right tibial vein: Secondary | ICD-10-CM | POA: Diagnosis not present

## 2024-03-23 DIAGNOSIS — E785 Hyperlipidemia, unspecified: Secondary | ICD-10-CM | POA: Diagnosis not present

## 2024-03-23 DIAGNOSIS — E871 Hypo-osmolality and hyponatremia: Secondary | ICD-10-CM | POA: Diagnosis not present

## 2024-03-23 DIAGNOSIS — M6281 Muscle weakness (generalized): Secondary | ICD-10-CM | POA: Diagnosis not present

## 2024-03-23 DIAGNOSIS — I700 Atherosclerosis of aorta: Secondary | ICD-10-CM | POA: Diagnosis not present

## 2024-03-23 DIAGNOSIS — Z9181 History of falling: Secondary | ICD-10-CM | POA: Diagnosis not present

## 2024-03-23 DIAGNOSIS — C3491 Malignant neoplasm of unspecified part of right bronchus or lung: Secondary | ICD-10-CM

## 2024-03-23 DIAGNOSIS — C7951 Secondary malignant neoplasm of bone: Secondary | ICD-10-CM | POA: Diagnosis not present

## 2024-03-23 DIAGNOSIS — Z7952 Long term (current) use of systemic steroids: Secondary | ICD-10-CM | POA: Diagnosis not present

## 2024-03-23 DIAGNOSIS — C187 Malignant neoplasm of sigmoid colon: Secondary | ICD-10-CM | POA: Diagnosis not present

## 2024-03-23 DIAGNOSIS — Z792 Long term (current) use of antibiotics: Secondary | ICD-10-CM | POA: Diagnosis not present

## 2024-03-23 DIAGNOSIS — K219 Gastro-esophageal reflux disease without esophagitis: Secondary | ICD-10-CM | POA: Diagnosis not present

## 2024-03-23 DIAGNOSIS — E860 Dehydration: Secondary | ICD-10-CM | POA: Diagnosis not present

## 2024-03-23 DIAGNOSIS — E876 Hypokalemia: Secondary | ICD-10-CM | POA: Diagnosis not present

## 2024-03-23 DIAGNOSIS — M8458XD Pathological fracture in neoplastic disease, other specified site, subsequent encounter for fracture with routine healing: Secondary | ICD-10-CM | POA: Diagnosis not present

## 2024-03-23 DIAGNOSIS — D630 Anemia in neoplastic disease: Secondary | ICD-10-CM | POA: Diagnosis not present

## 2024-03-23 DIAGNOSIS — Z7901 Long term (current) use of anticoagulants: Secondary | ICD-10-CM | POA: Diagnosis not present

## 2024-03-23 DIAGNOSIS — C786 Secondary malignant neoplasm of retroperitoneum and peritoneum: Secondary | ICD-10-CM | POA: Diagnosis not present

## 2024-03-23 DIAGNOSIS — J9601 Acute respiratory failure with hypoxia: Secondary | ICD-10-CM | POA: Diagnosis not present

## 2024-03-23 NOTE — Progress Notes (Signed)
 Regina Young is here today for follow up post radiation to the lung and lumbar spine.  Lung Side: Right, patient completed treatment on 02/10/24.  Does the patient complain of any of the following: Pain: yes, patient continues to have pain to right leg. Leg continues to be swollen. Patient remains on Eliquis  Shortness of breath w/wo exertion: No Cough: Yes-hard for patient to cough up sputum .  Hemoptysis: No Pain with swallowing: No Swallowing/choking concerns: No Appetite: Good Energy Level: Fair, decreased strength. Post radiation skin Changes: No Ambulation: Ambulates without assistance for short distances. Wheel chair for long distances.     Additional comments if applicable:   BP 131/77 (BP Location: Left Arm, Patient Position: Sitting)   Pulse 67   Temp (!) 97.1 F (36.2 C) (Temporal)   Resp 18   Ht 5' 2 (1.575 m)   Wt 107 lb 6 oz (48.7 kg)   SpO2 96%   BMI 19.64 kg/m

## 2024-03-24 ENCOUNTER — Other Ambulatory Visit: Payer: Self-pay | Admitting: *Deleted

## 2024-03-24 ENCOUNTER — Inpatient Hospital Stay

## 2024-03-24 VITALS — BP 113/64 | HR 75 | Temp 98.2°F | Resp 20

## 2024-03-24 DIAGNOSIS — C3491 Malignant neoplasm of unspecified part of right bronchus or lung: Secondary | ICD-10-CM | POA: Diagnosis not present

## 2024-03-24 DIAGNOSIS — C786 Secondary malignant neoplasm of retroperitoneum and peritoneum: Secondary | ICD-10-CM

## 2024-03-24 DIAGNOSIS — C3490 Malignant neoplasm of unspecified part of unspecified bronchus or lung: Secondary | ICD-10-CM

## 2024-03-24 DIAGNOSIS — Z5112 Encounter for antineoplastic immunotherapy: Secondary | ICD-10-CM | POA: Diagnosis not present

## 2024-03-24 DIAGNOSIS — C7951 Secondary malignant neoplasm of bone: Secondary | ICD-10-CM | POA: Diagnosis not present

## 2024-03-24 DIAGNOSIS — Z79620 Long term (current) use of immunosuppressive biologic: Secondary | ICD-10-CM | POA: Diagnosis not present

## 2024-03-24 DIAGNOSIS — C187 Malignant neoplasm of sigmoid colon: Secondary | ICD-10-CM

## 2024-03-24 DIAGNOSIS — Z5111 Encounter for antineoplastic chemotherapy: Secondary | ICD-10-CM | POA: Diagnosis not present

## 2024-03-24 DIAGNOSIS — Z87891 Personal history of nicotine dependence: Secondary | ICD-10-CM | POA: Diagnosis not present

## 2024-03-24 MED ORDER — OXYCODONE HCL 5 MG PO TABS: ORAL_TABLET | ORAL | 0 refills | Status: DC

## 2024-03-24 MED ORDER — DENOSUMAB 120 MG/1.7ML ~~LOC~~ SOLN
120.0000 mg | Freq: Once | SUBCUTANEOUS | Status: AC
Start: 2024-03-24 — End: 2024-03-24
  Administered 2024-03-24: 120 mg via SUBCUTANEOUS
  Filled 2024-03-24: qty 1.7

## 2024-03-24 NOTE — Patient Instructions (Signed)
 Denosumab Injection (Oncology) What is this medication? DENOSUMAB (den oh SUE mab) prevents weakened bones caused by cancer. It may also be used to treat noncancerous bone tumors that cannot be removed by surgery. It can also be used to treat high calcium levels in the blood caused by cancer. It works by blocking a protein that causes bones to break down quickly. This slows down the release of calcium from bones, which lowers calcium levels in your blood. It also makes your bones stronger and less likely to break (fracture). This medicine may be used for other purposes; ask your health care provider or pharmacist if you have questions. COMMON BRAND NAME(S): XGEVA What should I tell my care team before I take this medication? They need to know if you have any of these conditions: Dental disease Having surgery or tooth extraction Infection Kidney disease Low levels of calcium or vitamin D in the blood Malnutrition On hemodialysis Skin conditions or sensitivity Thyroid or parathyroid disease An unusual reaction to denosumab, other medications, foods, dyes, or preservatives Pregnant or trying to get pregnant Breast-feeding How should I use this medication? This medication is for injection under the skin. It is given by your care team in a hospital or clinic setting. A special MedGuide will be given to you before each treatment. Be sure to read this information carefully each time. Talk to your care team about the use of this medication in children. While it may be prescribed for children as young as 13 years for selected conditions, precautions do apply. Overdosage: If you think you have taken too much of this medicine contact a poison control center or emergency room at once. NOTE: This medicine is only for you. Do not share this medicine with others. What if I miss a dose? Keep appointments for follow-up doses. It is important not to miss your dose. Call your care team if you are unable to  keep an appointment. What may interact with this medication? Do not take this medication with any of the following: Other medications containing denosumab This medication may also interact with the following: Medications that lower your chance of fighting infection Steroid medications, such as prednisone or cortisone This list may not describe all possible interactions. Give your health care provider a list of all the medicines, herbs, non-prescription drugs, or dietary supplements you use. Also tell them if you smoke, drink alcohol, or use illegal drugs. Some items may interact with your medicine. What should I watch for while using this medication? Your condition will be monitored carefully while you are receiving this medication. You may need blood work while taking this medication. This medication may increase your risk of getting an infection. Call your care team for advice if you get a fever, chills, sore throat, or other symptoms of a cold or flu. Do not treat yourself. Try to avoid being around people who are sick. You should make sure you get enough calcium and vitamin D while you are taking this medication, unless your care team tells you not to. Discuss the foods you eat and the vitamins you take with your care team. Some people who take this medication have severe bone, joint, or muscle pain. This medication may also increase your risk for jaw problems or a broken thigh bone. Tell your care team right away if you have severe pain in your jaw, bones, joints, or muscles. Tell your care team if you have any pain that does not go away or that gets worse. Talk  to your care team if you may be pregnant. Serious birth defects can occur if you take this medication during pregnancy and for 5 months after the last dose. You will need a negative pregnancy test before starting this medication. Contraception is recommended while taking this medication and for 5 months after the last dose. Your care team  can help you find the option that works for you. What side effects may I notice from receiving this medication? Side effects that you should report to your care team as soon as possible: Allergic reactions--skin rash, itching, hives, swelling of the face, lips, tongue, or throat Bone, joint, or muscle pain Low calcium level--muscle pain or cramps, confusion, tingling, or numbness in the hands or feet Osteonecrosis of the jaw--pain, swelling, or redness in the mouth, numbness of the jaw, poor healing after dental work, unusual discharge from the mouth, visible bones in the mouth Side effects that usually do not require medical attention (report to your care team if they continue or are bothersome): Cough Diarrhea Fatigue Headache Nausea This list may not describe all possible side effects. Call your doctor for medical advice about side effects. You may report side effects to FDA at 1-800-FDA-1088. Where should I keep my medication? This medication is given in a hospital or clinic. It will not be stored at home. NOTE: This sheet is a summary. It may not cover all possible information. If you have questions about this medicine, talk to your doctor, pharmacist, or health care provider.  2024 Elsevier/Gold Standard (2022-01-14 00:00:00)

## 2024-03-25 ENCOUNTER — Other Ambulatory Visit: Payer: Self-pay | Admitting: Hematology & Oncology

## 2024-03-25 DIAGNOSIS — C3491 Malignant neoplasm of unspecified part of right bronchus or lung: Secondary | ICD-10-CM

## 2024-03-27 DIAGNOSIS — D630 Anemia in neoplastic disease: Secondary | ICD-10-CM | POA: Diagnosis not present

## 2024-03-27 DIAGNOSIS — E785 Hyperlipidemia, unspecified: Secondary | ICD-10-CM | POA: Diagnosis not present

## 2024-03-27 DIAGNOSIS — E876 Hypokalemia: Secondary | ICD-10-CM | POA: Diagnosis not present

## 2024-03-27 DIAGNOSIS — C786 Secondary malignant neoplasm of retroperitoneum and peritoneum: Secondary | ICD-10-CM | POA: Diagnosis not present

## 2024-03-27 DIAGNOSIS — M8458XD Pathological fracture in neoplastic disease, other specified site, subsequent encounter for fracture with routine healing: Secondary | ICD-10-CM | POA: Diagnosis not present

## 2024-03-27 DIAGNOSIS — E860 Dehydration: Secondary | ICD-10-CM | POA: Diagnosis not present

## 2024-03-27 DIAGNOSIS — K5900 Constipation, unspecified: Secondary | ICD-10-CM | POA: Diagnosis not present

## 2024-03-27 DIAGNOSIS — I700 Atherosclerosis of aorta: Secondary | ICD-10-CM | POA: Diagnosis not present

## 2024-03-27 DIAGNOSIS — M6281 Muscle weakness (generalized): Secondary | ICD-10-CM | POA: Diagnosis not present

## 2024-03-27 DIAGNOSIS — J189 Pneumonia, unspecified organism: Secondary | ICD-10-CM | POA: Diagnosis not present

## 2024-03-27 DIAGNOSIS — C3491 Malignant neoplasm of unspecified part of right bronchus or lung: Secondary | ICD-10-CM | POA: Diagnosis not present

## 2024-03-27 DIAGNOSIS — C7951 Secondary malignant neoplasm of bone: Secondary | ICD-10-CM | POA: Diagnosis not present

## 2024-03-27 DIAGNOSIS — Z90710 Acquired absence of both cervix and uterus: Secondary | ICD-10-CM | POA: Diagnosis not present

## 2024-03-27 DIAGNOSIS — Z9181 History of falling: Secondary | ICD-10-CM | POA: Diagnosis not present

## 2024-03-27 DIAGNOSIS — I82441 Acute embolism and thrombosis of right tibial vein: Secondary | ICD-10-CM | POA: Diagnosis not present

## 2024-03-27 DIAGNOSIS — E871 Hypo-osmolality and hyponatremia: Secondary | ICD-10-CM | POA: Diagnosis not present

## 2024-03-27 DIAGNOSIS — C187 Malignant neoplasm of sigmoid colon: Secondary | ICD-10-CM | POA: Diagnosis not present

## 2024-03-27 DIAGNOSIS — K219 Gastro-esophageal reflux disease without esophagitis: Secondary | ICD-10-CM | POA: Diagnosis not present

## 2024-03-27 DIAGNOSIS — Z87891 Personal history of nicotine dependence: Secondary | ICD-10-CM | POA: Diagnosis not present

## 2024-03-27 DIAGNOSIS — Z792 Long term (current) use of antibiotics: Secondary | ICD-10-CM | POA: Diagnosis not present

## 2024-03-27 DIAGNOSIS — Z7952 Long term (current) use of systemic steroids: Secondary | ICD-10-CM | POA: Diagnosis not present

## 2024-03-27 DIAGNOSIS — K589 Irritable bowel syndrome without diarrhea: Secondary | ICD-10-CM | POA: Diagnosis not present

## 2024-03-27 DIAGNOSIS — J9601 Acute respiratory failure with hypoxia: Secondary | ICD-10-CM | POA: Diagnosis not present

## 2024-03-27 DIAGNOSIS — Z7901 Long term (current) use of anticoagulants: Secondary | ICD-10-CM | POA: Diagnosis not present

## 2024-03-27 DIAGNOSIS — N1330 Unspecified hydronephrosis: Secondary | ICD-10-CM | POA: Diagnosis not present

## 2024-03-29 DIAGNOSIS — K589 Irritable bowel syndrome without diarrhea: Secondary | ICD-10-CM | POA: Diagnosis not present

## 2024-03-29 DIAGNOSIS — M6281 Muscle weakness (generalized): Secondary | ICD-10-CM | POA: Diagnosis not present

## 2024-03-29 DIAGNOSIS — C187 Malignant neoplasm of sigmoid colon: Secondary | ICD-10-CM | POA: Diagnosis not present

## 2024-03-29 DIAGNOSIS — Z9181 History of falling: Secondary | ICD-10-CM | POA: Diagnosis not present

## 2024-03-29 DIAGNOSIS — Z7952 Long term (current) use of systemic steroids: Secondary | ICD-10-CM | POA: Diagnosis not present

## 2024-03-29 DIAGNOSIS — Z7901 Long term (current) use of anticoagulants: Secondary | ICD-10-CM | POA: Diagnosis not present

## 2024-03-29 DIAGNOSIS — Z87891 Personal history of nicotine dependence: Secondary | ICD-10-CM | POA: Diagnosis not present

## 2024-03-29 DIAGNOSIS — K5900 Constipation, unspecified: Secondary | ICD-10-CM | POA: Diagnosis not present

## 2024-03-29 DIAGNOSIS — Z792 Long term (current) use of antibiotics: Secondary | ICD-10-CM | POA: Diagnosis not present

## 2024-03-29 DIAGNOSIS — N1330 Unspecified hydronephrosis: Secondary | ICD-10-CM | POA: Diagnosis not present

## 2024-03-29 DIAGNOSIS — C7951 Secondary malignant neoplasm of bone: Secondary | ICD-10-CM | POA: Diagnosis not present

## 2024-03-29 DIAGNOSIS — E871 Hypo-osmolality and hyponatremia: Secondary | ICD-10-CM | POA: Diagnosis not present

## 2024-03-29 DIAGNOSIS — Z90710 Acquired absence of both cervix and uterus: Secondary | ICD-10-CM | POA: Diagnosis not present

## 2024-03-29 DIAGNOSIS — M8458XD Pathological fracture in neoplastic disease, other specified site, subsequent encounter for fracture with routine healing: Secondary | ICD-10-CM | POA: Diagnosis not present

## 2024-03-29 DIAGNOSIS — C786 Secondary malignant neoplasm of retroperitoneum and peritoneum: Secondary | ICD-10-CM | POA: Diagnosis not present

## 2024-03-29 DIAGNOSIS — E860 Dehydration: Secondary | ICD-10-CM | POA: Diagnosis not present

## 2024-03-29 DIAGNOSIS — J189 Pneumonia, unspecified organism: Secondary | ICD-10-CM | POA: Diagnosis not present

## 2024-03-29 DIAGNOSIS — C3491 Malignant neoplasm of unspecified part of right bronchus or lung: Secondary | ICD-10-CM | POA: Diagnosis not present

## 2024-03-29 DIAGNOSIS — J9601 Acute respiratory failure with hypoxia: Secondary | ICD-10-CM | POA: Diagnosis not present

## 2024-03-29 DIAGNOSIS — K219 Gastro-esophageal reflux disease without esophagitis: Secondary | ICD-10-CM | POA: Diagnosis not present

## 2024-03-29 DIAGNOSIS — D630 Anemia in neoplastic disease: Secondary | ICD-10-CM | POA: Diagnosis not present

## 2024-03-29 DIAGNOSIS — E876 Hypokalemia: Secondary | ICD-10-CM | POA: Diagnosis not present

## 2024-03-29 DIAGNOSIS — E785 Hyperlipidemia, unspecified: Secondary | ICD-10-CM | POA: Diagnosis not present

## 2024-03-29 DIAGNOSIS — I82441 Acute embolism and thrombosis of right tibial vein: Secondary | ICD-10-CM | POA: Diagnosis not present

## 2024-03-29 DIAGNOSIS — I700 Atherosclerosis of aorta: Secondary | ICD-10-CM | POA: Diagnosis not present

## 2024-04-03 DIAGNOSIS — K5909 Other constipation: Secondary | ICD-10-CM | POA: Diagnosis not present

## 2024-04-03 DIAGNOSIS — C3490 Malignant neoplasm of unspecified part of unspecified bronchus or lung: Secondary | ICD-10-CM | POA: Diagnosis not present

## 2024-04-03 DIAGNOSIS — G8929 Other chronic pain: Secondary | ICD-10-CM | POA: Diagnosis not present

## 2024-04-04 ENCOUNTER — Other Ambulatory Visit: Payer: Self-pay

## 2024-04-05 DIAGNOSIS — Z792 Long term (current) use of antibiotics: Secondary | ICD-10-CM | POA: Diagnosis not present

## 2024-04-05 DIAGNOSIS — M8458XD Pathological fracture in neoplastic disease, other specified site, subsequent encounter for fracture with routine healing: Secondary | ICD-10-CM | POA: Diagnosis not present

## 2024-04-05 DIAGNOSIS — D630 Anemia in neoplastic disease: Secondary | ICD-10-CM | POA: Diagnosis not present

## 2024-04-05 DIAGNOSIS — J9601 Acute respiratory failure with hypoxia: Secondary | ICD-10-CM | POA: Diagnosis not present

## 2024-04-05 DIAGNOSIS — J189 Pneumonia, unspecified organism: Secondary | ICD-10-CM | POA: Diagnosis not present

## 2024-04-05 DIAGNOSIS — Z87891 Personal history of nicotine dependence: Secondary | ICD-10-CM | POA: Diagnosis not present

## 2024-04-05 DIAGNOSIS — E876 Hypokalemia: Secondary | ICD-10-CM | POA: Diagnosis not present

## 2024-04-05 DIAGNOSIS — I82441 Acute embolism and thrombosis of right tibial vein: Secondary | ICD-10-CM | POA: Diagnosis not present

## 2024-04-05 DIAGNOSIS — E860 Dehydration: Secondary | ICD-10-CM | POA: Diagnosis not present

## 2024-04-05 DIAGNOSIS — E785 Hyperlipidemia, unspecified: Secondary | ICD-10-CM | POA: Diagnosis not present

## 2024-04-05 DIAGNOSIS — E871 Hypo-osmolality and hyponatremia: Secondary | ICD-10-CM | POA: Diagnosis not present

## 2024-04-05 DIAGNOSIS — M6281 Muscle weakness (generalized): Secondary | ICD-10-CM | POA: Diagnosis not present

## 2024-04-05 DIAGNOSIS — C3491 Malignant neoplasm of unspecified part of right bronchus or lung: Secondary | ICD-10-CM | POA: Diagnosis not present

## 2024-04-05 DIAGNOSIS — Z7952 Long term (current) use of systemic steroids: Secondary | ICD-10-CM | POA: Diagnosis not present

## 2024-04-05 DIAGNOSIS — K589 Irritable bowel syndrome without diarrhea: Secondary | ICD-10-CM | POA: Diagnosis not present

## 2024-04-05 DIAGNOSIS — Z90710 Acquired absence of both cervix and uterus: Secondary | ICD-10-CM | POA: Diagnosis not present

## 2024-04-05 DIAGNOSIS — K219 Gastro-esophageal reflux disease without esophagitis: Secondary | ICD-10-CM | POA: Diagnosis not present

## 2024-04-05 DIAGNOSIS — N1330 Unspecified hydronephrosis: Secondary | ICD-10-CM | POA: Diagnosis not present

## 2024-04-05 DIAGNOSIS — C7951 Secondary malignant neoplasm of bone: Secondary | ICD-10-CM | POA: Diagnosis not present

## 2024-04-05 DIAGNOSIS — Z9181 History of falling: Secondary | ICD-10-CM | POA: Diagnosis not present

## 2024-04-05 DIAGNOSIS — K5900 Constipation, unspecified: Secondary | ICD-10-CM | POA: Diagnosis not present

## 2024-04-05 DIAGNOSIS — I700 Atherosclerosis of aorta: Secondary | ICD-10-CM | POA: Diagnosis not present

## 2024-04-05 DIAGNOSIS — Z7901 Long term (current) use of anticoagulants: Secondary | ICD-10-CM | POA: Diagnosis not present

## 2024-04-05 DIAGNOSIS — C187 Malignant neoplasm of sigmoid colon: Secondary | ICD-10-CM | POA: Diagnosis not present

## 2024-04-05 DIAGNOSIS — C786 Secondary malignant neoplasm of retroperitoneum and peritoneum: Secondary | ICD-10-CM | POA: Diagnosis not present

## 2024-04-10 ENCOUNTER — Other Ambulatory Visit: Payer: Self-pay | Admitting: *Deleted

## 2024-04-10 ENCOUNTER — Other Ambulatory Visit: Payer: Self-pay | Admitting: Hematology & Oncology

## 2024-04-10 DIAGNOSIS — C3491 Malignant neoplasm of unspecified part of right bronchus or lung: Secondary | ICD-10-CM

## 2024-04-11 ENCOUNTER — Other Ambulatory Visit (HOSPITAL_COMMUNITY): Payer: Self-pay

## 2024-04-12 ENCOUNTER — Inpatient Hospital Stay

## 2024-04-12 ENCOUNTER — Inpatient Hospital Stay: Attending: Hematology & Oncology

## 2024-04-12 ENCOUNTER — Inpatient Hospital Stay: Admitting: Hematology & Oncology

## 2024-04-12 ENCOUNTER — Encounter: Payer: Self-pay | Admitting: *Deleted

## 2024-04-12 ENCOUNTER — Encounter: Payer: Self-pay | Admitting: Hematology & Oncology

## 2024-04-12 VITALS — BP 125/79 | HR 76 | Temp 98.0°F | Resp 20 | Ht 62.0 in | Wt 108.0 lb

## 2024-04-12 DIAGNOSIS — C3491 Malignant neoplasm of unspecified part of right bronchus or lung: Secondary | ICD-10-CM

## 2024-04-12 DIAGNOSIS — Z87891 Personal history of nicotine dependence: Secondary | ICD-10-CM | POA: Insufficient documentation

## 2024-04-12 DIAGNOSIS — C7951 Secondary malignant neoplasm of bone: Secondary | ICD-10-CM | POA: Insufficient documentation

## 2024-04-12 DIAGNOSIS — Z5111 Encounter for antineoplastic chemotherapy: Secondary | ICD-10-CM | POA: Insufficient documentation

## 2024-04-12 DIAGNOSIS — Z79620 Long term (current) use of immunosuppressive biologic: Secondary | ICD-10-CM | POA: Diagnosis not present

## 2024-04-12 DIAGNOSIS — Z5112 Encounter for antineoplastic immunotherapy: Secondary | ICD-10-CM | POA: Diagnosis not present

## 2024-04-12 DIAGNOSIS — Z79899 Other long term (current) drug therapy: Secondary | ICD-10-CM | POA: Diagnosis not present

## 2024-04-12 DIAGNOSIS — I82621 Acute embolism and thrombosis of deep veins of right upper extremity: Secondary | ICD-10-CM

## 2024-04-12 DIAGNOSIS — C786 Secondary malignant neoplasm of retroperitoneum and peritoneum: Secondary | ICD-10-CM

## 2024-04-12 LAB — CBC WITH DIFFERENTIAL (CANCER CENTER ONLY)
Abs Immature Granulocytes: 0.25 K/uL — ABNORMAL HIGH (ref 0.00–0.07)
Basophils Absolute: 0 K/uL (ref 0.0–0.1)
Basophils Relative: 1 %
Eosinophils Absolute: 0 K/uL (ref 0.0–0.5)
Eosinophils Relative: 0 %
HCT: 30.2 % — ABNORMAL LOW (ref 36.0–46.0)
Hemoglobin: 9.6 g/dL — ABNORMAL LOW (ref 12.0–15.0)
Immature Granulocytes: 5 %
Lymphocytes Relative: 14 %
Lymphs Abs: 0.8 K/uL (ref 0.7–4.0)
MCH: 28.6 pg (ref 26.0–34.0)
MCHC: 31.8 g/dL (ref 30.0–36.0)
MCV: 89.9 fL (ref 80.0–100.0)
Monocytes Absolute: 1.1 K/uL — ABNORMAL HIGH (ref 0.1–1.0)
Monocytes Relative: 19 %
Neutro Abs: 3.5 K/uL (ref 1.7–7.7)
Neutrophils Relative %: 61 %
Platelet Count: 300 K/uL (ref 150–400)
RBC: 3.36 MIL/uL — ABNORMAL LOW (ref 3.87–5.11)
RDW: 17.1 % — ABNORMAL HIGH (ref 11.5–15.5)
WBC Count: 5.6 K/uL (ref 4.0–10.5)
nRBC: 0 % (ref 0.0–0.2)

## 2024-04-12 LAB — CMP (CANCER CENTER ONLY)
ALT: 26 U/L (ref 0–44)
AST: 22 U/L (ref 15–41)
Albumin: 3.7 g/dL (ref 3.5–5.0)
Alkaline Phosphatase: 72 U/L (ref 38–126)
Anion gap: 10 (ref 5–15)
BUN: 18 mg/dL (ref 8–23)
CO2: 24 mmol/L (ref 22–32)
Calcium: 8.7 mg/dL — ABNORMAL LOW (ref 8.9–10.3)
Chloride: 102 mmol/L (ref 98–111)
Creatinine: 0.65 mg/dL (ref 0.44–1.00)
GFR, Estimated: >60 mL/min (ref >60)
Glucose, Bld: 91 mg/dL (ref 70–99)
Potassium: 4.4 mmol/L (ref 3.5–5.1)
Sodium: 137 mmol/L (ref 135–145)
Total Bilirubin: 0.4 mg/dL (ref 0.0–1.2)
Total Protein: 6.3 g/dL — ABNORMAL LOW (ref 6.5–8.1)

## 2024-04-12 LAB — LACTATE DEHYDROGENASE: LDH: 248 U/L — ABNORMAL HIGH (ref 98–192)

## 2024-04-12 MED ORDER — DEXAMETHASONE SODIUM PHOSPHATE 10 MG/ML IJ SOLN
10.0000 mg | Freq: Once | INTRAMUSCULAR | Status: AC
  Administered 2024-04-12: 10 mg via INTRAVENOUS
  Filled 2024-04-12: qty 1

## 2024-04-12 MED ORDER — SODIUM CHLORIDE 0.9 % IV SOLN
341.0000 mg | Freq: Once | INTRAVENOUS | Status: AC
  Administered 2024-04-12: 340 mg via INTRAVENOUS
  Filled 2024-04-12: qty 34

## 2024-04-12 MED ORDER — PALONOSETRON HCL INJECTION 0.25 MG/5ML
0.2500 mg | Freq: Once | INTRAVENOUS | Status: AC
  Administered 2024-04-12: 0.25 mg via INTRAVENOUS
  Filled 2024-04-12: qty 5

## 2024-04-12 MED ORDER — SODIUM CHLORIDE 0.9 % IV SOLN: INTRAVENOUS | Status: DC

## 2024-04-12 MED ORDER — APREPITANT 130 MG/18ML IV EMUL
130.0000 mg | Freq: Once | INTRAVENOUS | Status: AC
  Administered 2024-04-12: 130 mg via INTRAVENOUS
  Filled 2024-04-12: qty 18

## 2024-04-12 MED ORDER — SODIUM CHLORIDE 0.9 % IV SOLN
500.0000 mg/m2 | Freq: Once | INTRAVENOUS | Status: AC
  Administered 2024-04-12: 700 mg via INTRAVENOUS
  Filled 2024-04-12: qty 20

## 2024-04-12 MED ORDER — SODIUM CHLORIDE 0.9 % IV SOLN
200.0000 mg | Freq: Once | INTRAVENOUS | Status: AC
  Administered 2024-04-12: 200 mg via INTRAVENOUS
  Filled 2024-04-12: qty 8

## 2024-04-12 NOTE — Progress Notes (Signed)
 Hematology and Oncology Follow Up Visit  Regina Young [id number] 1956/08/14 68 y.o. 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  She received 5000 rad to the lung and 3000 rad to the spine. Xgeva  120 mg subcu every 3 months-next dose 06/2024 Carbo/Alimta /Keytruda  -- s/p cycle #1 - start on 03/21/2024 Eliquis  5 mg p.o. twice daily --start 02/01/2024     Interim History:  Regina Young is in for follow-up.  She is doing okay.  She tolerated the first cycle of chemotherapy pretty well.  She is still bothered by the right leg.  I suspect this probably is because of the invasion into her sacrum by the metastatic lung cancer.  She has had her radiotherapy for this.  She has had 1 cycle of chemotherapy.  Again, how much or how much we will be able to improve the leg.  She then had a blood clot in the right leg.  This was a thing back in May.  We will have to recheck a Doppler on her right leg to see how the blood clot looks.  She is eating okay.  She has had no problems with nausea or vomiting.  She has had no issues with cough.  She has had no shortness of breath.  She has had no bleeding.  There has been no obvious change in bowel or bladder habits.  She has a little bit of weakness in the right leg.  I think she had been taking some physical therapy for the right leg.  I think this is a very good idea that she continue this.  Currently, I would have to say that her performance status is probably ECOG 1.   Medications:  Current Outpatient Medications:    apixaban  (ELIQUIS ) 5 MG TABS tablet, Take 1 tablet (5 mg total) by mouth 2 (two) times daily., Disp: 60 tablet, Rfl: 3   Calcium  Citrate 250 MG TABS, Take 250 mg by  mouth in the morning., Disp: , Rfl:    Cholecalciferol  (VITAMIN D3) 50 MCG (2000 UT) TABS, Take 2,000 Units by mouth daily with breakfast., Disp: , Rfl:    cyanocobalamin  (VITAMIN B12) 1000 MCG tablet, Take 1,000 mcg by mouth daily., Disp: , Rfl:    fluticasone  (FLONASE ) 50 MCG/ACT nasal spray, Place 2 sprays into both nostrils daily. (Patient taking differently: Place 2 sprays into both nostrils daily as needed.), Disp: 16 g, Rfl: 0   folic acid  (FOLVITE ) 1 MG tablet, Take 1 tablet (1 mg total) by mouth daily. Start 7 days before pemetrexed  chemotherapy. Continue until 21 days after pemetrexed  completed., Disp: 100 tablet, Rfl: 3   gabapentin  (NEURONTIN ) 300 MG capsule, Take 1 capsule (300 mg total) by mouth 3 (three) times daily., Disp: 90 capsule, Rfl: 1   ipratropium-albuterol  (DUONEB) 0.5-2.5 (3) MG/3ML SOLN, Take 3 mLs by nebulization 3 (three) times daily for 7 days, THEN 3 mLs every 6 (six) hours as needed., Disp: 360 mL, Rfl: 1   metoprolol  tartrate (LOPRESSOR ) 25 MG tablet, Take 1 tablet (25 mg total) by mouth 2 (two) times daily., Disp: 60 tablet, Rfl: 1   morphine  (MS CONTIN ) 15 MG 12 hr tablet, Take 1 tablet (15 mg total) by mouth every 12 (twelve) hours., Disp: 60 tablet, Rfl: 0  omeprazole  (PRILOSEC ) 20 MG capsule, Take 2 capsules (40 mg total) by mouth daily before breakfast., Disp: 60 capsule, Rfl: 1   ondansetron  (ZOFRAN ) 4 MG tablet, Take 1 tablet (4 mg total) by mouth every 6 (six) hours as needed for nausea., Disp: 20 tablet, Rfl: 0   ondansetron  (ZOFRAN ) 8 MG tablet, Take 1 tablet (8 mg total) by mouth every 8 (eight) hours as needed for nausea or vomiting. Start on the third day after carboplatin ., Disp: 30 tablet, Rfl: 1   oxyCODONE  (OXY IR/ROXICODONE ) 5 MG immediate release tablet, Take one to two tablets every six hours as needed for pain, Disp: 90 tablet, Rfl: 0   rosuvastatin  (CRESTOR ) 5 MG tablet, Take 5 mg by mouth in the morning., Disp: , Rfl:    dexamethasone  (DECADRON )  4 MG tablet, Take 1 tab 2 times daily starting day before pemetrexed . Then take 2 tabs daily x 3 days starting day after carboplatin . Take with food., Disp: 30 tablet, Rfl: 1   predniSONE  (DELTASONE ) 10 MG tablet, Take 4 tablets (40 mg total) by mouth daily with breakfast for 14 days, THEN 3 tablets (30 mg total) daily with breakfast for 14 days, THEN 2 tablets (20 mg total) daily with breakfast for 14 days, THEN 1 tablet (10 mg total) daily with breakfast for 14 days., Disp: 140 tablet, Rfl: 0   prochlorperazine  (COMPAZINE ) 10 MG tablet, TAKE 1 TABLET(10 MG) BY MOUTH EVERY 6 HOURS AS NEEDED FOR NAUSEA OR VOMITING (Patient not taking: Reported on 04/12/2024), Disp: 30 tablet, Rfl: 1  Allergies: No Known Allergies  Past Medical History, Surgical history, Social history, and Family History were reviewed and updated.  Review of Systems: Review of Systems  Constitutional: Negative.   HENT:  Negative.    Eyes: Negative.   Respiratory: Negative.    Cardiovascular: Negative.   Gastrointestinal:  Positive for abdominal pain.  Endocrine: Negative.   Genitourinary: Negative.    Musculoskeletal:  Positive for back pain.  Neurological: Negative.   Hematological: Negative.   Psychiatric/Behavioral: Negative.      Physical Exam:  height is 5' 2 (1.575 m) and weight is 108 lb (49 kg). Her oral temperature is 98 F (36.7 C). Her blood pressure is 125/79 and her pulse is 76. Her respiration is 20 and oxygen saturation is 97%.   Wt Readings from Last 3 Encounters:  04/12/24 108 lb (49 kg)  03/23/24 107 lb 6 oz (48.7 kg)  03/21/24 107 lb (48.5 kg)    Physical Exam Vitals reviewed.  HENT:     Head: Normocephalic and atraumatic.  Eyes:     Pupils: Pupils are equal, round, and reactive to light.  Cardiovascular:     Rate and Rhythm: Normal rate and regular rhythm.     Heart sounds: Normal heart sounds.  Pulmonary:     Effort: Pulmonary effort is normal.     Breath sounds: Normal breath sounds.   Abdominal:     General: Bowel sounds are normal.     Palpations: Abdomen is soft.  Musculoskeletal:        General: No tenderness or deformity. Normal range of motion.     Cervical back: Normal range of motion.  Lymphadenopathy:     Cervical: No cervical adenopathy.  Skin:    General: Skin is warm and dry.     Findings: No erythema or rash.  Neurological:     Mental Status: She is alert and oriented to person, place, and time.  Psychiatric:  Behavior: Behavior normal.        Thought Content: Thought content normal.        Judgment: Judgment normal.      Lab Results  Component Value Date   WBC 5.6 04/12/2024   HGB 9.6 (L) 04/12/2024   HCT 30.2 (L) 04/12/2024   MCV 89.9 04/12/2024   PLT 300 04/12/2024     Chemistry      Component Value Date/Time   NA 137 04/12/2024 0943   K 4.4 04/12/2024 0943   CL 102 04/12/2024 0943   CO2 24 04/12/2024 0943   BUN 18 04/12/2024 0943   CREATININE 0.65 04/12/2024 0943      Component Value Date/Time   CALCIUM  8.7 (L) 04/12/2024 0943   ALKPHOS 72 04/12/2024 0943   AST 22 04/12/2024 0943   ALT 26 04/12/2024 0943   BILITOT 0.4 04/12/2024 0943      Impression and Plan: Ms. Privitera is a very charming 68 year old white female.  She has a past history of an early stage colon cancer.  However, now it looks like she has a metastatic non-small cell lung cancer-adenocarcinoma.  She completed the radiotherapy for the sacral metastasis and the lung primary.  Hopefully, we will see that she is responding.  This to be her second cycle of chemoimmunotherapy.  I probably would do 3 cycles and then repeat a scan.  I told her that she needs to take the dexamethasone  in the peri-chemotherapy..  I thinks may help her out a little bit.  I know that she is frustrated by the right leg.  Hopefully, this will improve.  Will go ahead and plan to get her back in 3 more weeks.  We will then plan for follow-up PET scan.   Maude JONELLE Crease,  MD 8/6/202510:28 AM

## 2024-04-12 NOTE — Patient Instructions (Signed)
 [facility] - A DEPT OF [facility]  Discharge Instructions: Thank you for choosing [facility] to provide your oncology and hematology care.   If you have a lab appointment with the Cancer Center, please go directly to the Cancer Center and check in at the registration area.  Wear comfortable clothing and clothing appropriate for easy access to any Portacath or PICC line.   We strive to give you quality time with your provider. You may need to reschedule your appointment if you arrive late (15 or more minutes).  Arriving late affects you and other patients whose appointments are after yours.  Also, if you miss three or more appointments without notifying the office, you may be dismissed from the clinic at the provider's discretion.      For prescription refill requests, have your pharmacy contact our office and allow 72 hours for refills to be completed.    Today you received the following chemotherapy and/or immunotherapy agents CARBOPLATIN , KEYTRUDA , ALIMTA       To help prevent nausea and vomiting after your treatment, we encourage you to take your nausea medication as directed.  BELOW ARE SYMPTOMS THAT SHOULD BE REPORTED IMMEDIATELY: *FEVER GREATER THAN 100.4 F (38 C) OR HIGHER *CHILLS OR SWEATING *NAUSEA AND VOMITING THAT IS NOT CONTROLLED WITH YOUR NAUSEA MEDICATION *UNUSUAL SHORTNESS OF BREATH *UNUSUAL BRUISING OR BLEEDING *URINARY PROBLEMS (pain or burning when urinating, or frequent urination) *BOWEL PROBLEMS (unusual diarrhea, constipation, pain near the anus) TENDERNESS IN MOUTH AND THROAT WITH OR WITHOUT PRESENCE OF ULCERS (sore throat, sores in mouth, or a toothache) UNUSUAL RASH, SWELLING OR PAIN  UNUSUAL VAGINAL DISCHARGE OR ITCHING   Items with * indicate a potential emergency and should be followed up as soon as possible or go to the Emergency Department if any problems should occur.  Please show the CHEMOTHERAPY ALERT CARD  or IMMUNOTHERAPY ALERT CARD at check-in to the Emergency Department and triage nurse. Should you have questions after your visit or need to cancel or reschedule your appointment, please contact [facility] - A DEPT OF [facility]  [phone] and follow the prompts.  Office hours are 8:00 a.m. to 4:30 p.m. Monday - Friday. Please note that voicemails left after 4:00 p.m. may not be returned until the following business day.  We are closed weekends and major holidays. You have access to a nurse at all times for urgent questions. Please call the main number to the clinic [phone] and follow the prompts.  For any non-urgent questions, you may also contact your provider using MyChart. We now offer e-Visits for anyone 67 and older to request care online for non-urgent symptoms. For details visit [email or web address].   Also download the MyChart app! Go to the app store, search MyChart, open the app, select [facility], and log in with your MyChart username and password.

## 2024-04-12 NOTE — Addendum Note (Signed)
 Addended by: TIMMY MAUDE SAUNDERS on: 04/12/2024 04:46 PM   Modules accepted: Orders

## 2024-04-12 NOTE — Progress Notes (Signed)
 Patient had good tolerance of first cycle. She will proceed with cycle two today.   Oncology Nurse Navigator Documentation     04/12/2024   10:15 AM  Oncology Nurse Navigator Flowsheets  Navigator Follow Up Date: 05/03/2024  Navigator Follow Up Reason: Follow-up Appointment;Chemotherapy  Navigator Radiographer, therapeutic Encounter Type Treatment  Patient Visit Type MedOnc  Treatment Phase Active Tx  Barriers/Navigation Needs Coordination of Care;Education  Interventions Psycho-Social Support  Acuity Level 2-Minimal Needs (1-2 Barriers Identified)  Support Groups/Services Friends and Family  Time Spent with Patient 15

## 2024-04-14 ENCOUNTER — Other Ambulatory Visit: Payer: Self-pay | Admitting: *Deleted

## 2024-04-14 ENCOUNTER — Other Ambulatory Visit: Payer: Self-pay

## 2024-04-14 DIAGNOSIS — C3490 Malignant neoplasm of unspecified part of unspecified bronchus or lung: Secondary | ICD-10-CM

## 2024-04-14 DIAGNOSIS — C187 Malignant neoplasm of sigmoid colon: Secondary | ICD-10-CM

## 2024-04-14 DIAGNOSIS — C786 Secondary malignant neoplasm of retroperitoneum and peritoneum: Secondary | ICD-10-CM

## 2024-04-14 DIAGNOSIS — C3491 Malignant neoplasm of unspecified part of right bronchus or lung: Secondary | ICD-10-CM

## 2024-04-14 MED ORDER — OXYCODONE HCL 5 MG PO TABS: ORAL_TABLET | ORAL | 0 refills | Status: DC

## 2024-04-14 MED ORDER — MORPHINE SULFATE ER 15 MG PO TBCR: 15.0000 mg | EXTENDED_RELEASE_TABLET | Freq: Two times a day (BID) | ORAL | 0 refills | Status: DC

## 2024-04-17 ENCOUNTER — Encounter: Payer: Self-pay | Admitting: Pulmonary Disease

## 2024-04-17 ENCOUNTER — Ambulatory Visit: Admitting: Pulmonary Disease

## 2024-04-17 ENCOUNTER — Other Ambulatory Visit: Payer: Self-pay | Admitting: Hematology & Oncology

## 2024-04-17 VITALS — BP 122/81 | HR 82 | Temp 98.0°F | Ht 62.0 in | Wt 105.8 lb

## 2024-04-17 DIAGNOSIS — C3491 Malignant neoplasm of unspecified part of right bronchus or lung: Secondary | ICD-10-CM

## 2024-04-17 DIAGNOSIS — D487 Neoplasm of uncertain behavior of other specified sites: Secondary | ICD-10-CM | POA: Diagnosis not present

## 2024-04-17 DIAGNOSIS — R530 Neoplastic (malignant) related fatigue: Secondary | ICD-10-CM

## 2024-04-17 DIAGNOSIS — R5383 Other fatigue: Secondary | ICD-10-CM | POA: Diagnosis not present

## 2024-04-17 DIAGNOSIS — J9601 Acute respiratory failure with hypoxia: Secondary | ICD-10-CM

## 2024-04-17 DIAGNOSIS — T451X5A Adverse effect of antineoplastic and immunosuppressive drugs, initial encounter: Secondary | ICD-10-CM

## 2024-04-17 NOTE — Patient Instructions (Addendum)
                          VISIT SUMMARY: You had a follow-up appointment today to discuss your recent hospitalization for lung inflammation and your ongoing treatment for lung cancer and colon carcinoma. You are currently undergoing chemotherapy and have experienced some side effects, including fatigue and decreased appetite. 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  YOUR PLAN: -LUNG CANCER ON CHEMOTHERAPY: Lung cancer is being treated with a combination of medications: carboplatin , Alimta , and Keytruda . Chemotherapy started after your hospitalization for pneumonia. Keytruda  can sometimes cause lung inflammation, but your recent lung issue was not related to the chemotherapy. We will monitor for any signs of lung inflammation and review your PET scan results after your next chemotherapy session. A follow-up appointment is scheduled in three months to review your progress and scan results.  -RECENT PNEUMONIA WITH BILATERAL PULMONARY INFILTRATES, RESOLVED: You were recently treated for pneumonia with antibiotics and prednisone , and your symptoms have resolved. You no longer need home oxygen. We will review your PET scan results to ensure that the lung inflammation has completely resolved.  -CHEMOTHERAPY-INDUCED FATIGUE AND DECREASED APPETITE: You are experiencing fatigue and a decreased appetite, which are common side effects of chemotherapy. It is important to stay active, so try to engage in regular physical activity like walking. To help with your nutritional intake, consider using nutritional supplements like Ensure or Premier Protein.  INSTRUCTIONS: We will review your PET scan results after your next chemotherapy session to assess your lung status and ensure the resolution of pulmonary infiltrates. Please schedule a follow-up appointment in three months to review your progress and scan  results.                      Contains text generated by [employer].                                 Contains text generated by [employer]bridge.                                 Contains text generated by [employer].

## 2024-04-17 NOTE — Progress Notes (Signed)
 Regina Young    [id number]    [birth year]  Primary Care Physician:Raju, Trula SQUIBB, MD  Referring Physician: Dwight Trula SQUIBB, MD [address] [place],  [place] [address]  Chief complaint: Follow-up after recent hospitalization for pneumonia.  HPI: 68 y.o. 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  Discussed the use of AI scribe software for clinical note transcription with the patient, who gave verbal consent to proceed.  History of Present Illness Regina Young is a 68 year old female with colon carcinoma and lung cancer who presents for follow-up after recent hospitalization for pneumonia.  Pulmonary symptoms and recent hospitalization - Hospitalized in early July 2025 for lung inflammation, suspected to be pneumonia - Treated with antibiotics and prednisone  during hospitalization - Required supplemental oxygen during hospital stay, but discharged without oxygen - No current dyspnea or breathing difficulties - Has not used nebulizer since initial treatment period; prefers mask over hose for nebulizer treatments  Chemotherapy and associated symptoms - Currently undergoing chemotherapy with carboplatin , Alimta , and Keytruda  - Completed second chemotherapy session last Wednesday - Takes dexamethasone  as part of chemotherapy regimen (two tablets for three days post-chemotherapy, last dose completed last Saturday) - Feels 'okay' following most recent chemotherapy session - Decreased appetite since starting chemotherapy, despite being offered various foods - Increased fatigue since starting chemotherapy  Neurological and musculoskeletal symptoms -  Persistent right leg heaviness described as 'three hundred pound right leg' - Attributed to tumor on pelvis compressing vertebrae - Right leg symptoms have been ongoing since beginning of treatment  Oncologic history and treatment access - Diagnosed with colon carcinoma in 2022 - Recent diagnosis of lung cancer - Port placed on right side for chemotherapy administration - Was unresponsive prior to hospital admission, leading to ambulance transport - Hospitalization delayed initiation of chemotherapy, which was scheduled for the following Tuesday  Relevant Pulmonary history: Pets: Occupation: Exposures: No h/o chemo/XRT/amiodarone/macrodantin/MTX  No exposure to asbestos, silica or other organic allergens  Smoking history: Travel history: Family history:   Outpatient Encounter Medications as of 04/17/2024  Medication Sig   apixaban  (ELIQUIS ) 5 MG TABS tablet Take 1 tablet (5 mg total) by mouth 2 (two) times daily.   Calcium  Citrate 250 MG TABS Take 250 mg by mouth in the morning.   Cholecalciferol  (VITAMIN D3) 50 MCG (2000 UT) TABS Take 2,000 Units by mouth daily with breakfast.   cyanocobalamin  (VITAMIN B12) 1000 MCG tablet Take 1,000 mcg by mouth daily.   dexamethasone  (DECADRON ) 4 MG tablet Take 1 tab 2 times daily starting day before pemetrexed . Then take 2 tabs daily x 3 days starting day after carboplatin . Take with food.   fluticasone  (FLONASE ) 50 MCG/ACT nasal spray Place 2 sprays into both nostrils daily. (Patient taking differently: Place 2 sprays into both nostrils daily as needed.)   folic acid  (FOLVITE ) 1 MG tablet Take 1 tablet (1 mg total) by mouth daily. Start 7 days before pemetrexed  chemotherapy. Continue until 21 days after pemetrexed  completed.   gabapentin  (NEURONTIN ) 300 MG capsule TAKE 1 CAPSULE(300 MG) BY MOUTH THREE TIMES DAILY   ipratropium-albuterol  (DUONEB) 0.5-2.5 (3) MG/3ML SOLN Take 3 mLs by nebulization 3 (three) times daily for 7 days, THEN  3 mLs every 6  (six) hours as needed.   metoprolol  tartrate (LOPRESSOR ) 25 MG tablet Take 1 tablet (25 mg total) by mouth 2 (two) times daily.   morphine  (MS CONTIN ) 15 MG 12 hr tablet Take 1 tablet (15 mg total) by mouth every 12 (twelve) hours.   omeprazole  (PRILOSEC ) 20 MG capsule Take 2 capsules (40 mg total) by mouth daily before breakfast.   ondansetron  (ZOFRAN ) 4 MG tablet Take 1 tablet (4 mg total) by mouth every 6 (six) hours as needed for nausea.   ondansetron  (ZOFRAN ) 8 MG tablet Take 1 tablet (8 mg total) by mouth every 8 (eight) hours as needed for nausea or vomiting. Start on the third day after carboplatin .   oxyCODONE  (OXY IR/ROXICODONE ) 5 MG immediate release tablet Take one to two tablets every six hours as needed for pain   rosuvastatin  (CRESTOR ) 5 MG tablet Take 5 mg by mouth in the morning.   prochlorperazine  (COMPAZINE ) 10 MG tablet TAKE 1 TABLET(10 MG) BY MOUTH EVERY 6 HOURS AS NEEDED FOR NAUSEA OR VOMITING (Patient not taking: Reported on 04/17/2024)   [DISCONTINUED] gabapentin  (NEURONTIN ) 300 MG capsule Take 1 capsule (300 mg total) by mouth 3 (three) times daily.   [DISCONTINUED] predniSONE  (DELTASONE ) 10 MG tablet Take 4 tablets (40 mg total) by mouth daily with breakfast for 14 days, THEN 3 tablets (30 mg total) daily with breakfast for 14 days, THEN 2 tablets (20 mg total) daily with breakfast for 14 days, THEN 1 tablet (10 mg total) daily with breakfast for 14 days.   No facility-administered encounter medications on file as of 04/17/2024.     Physical Exam: Today's Vitals   04/17/24 1023  BP: 122/81  Pulse: 82  Temp: 98 F (36.7 C)  TempSrc: Temporal  SpO2: 96%  Weight: 105 lb 12.8 oz (48 kg)  Height: 5' 2 (1.575 m)   Body mass index is 19.35 kg/m.  Physical Exam GEN: No acute distress. CV: Regular rate and rhythm, no murmurs. LUNGS: Clear to auscultation bilaterally, normal respiratory effort. SKIN JOINTS: Warm and dry, no rash.    Data Reviewed: Imaging: CT  chest 03/04/2024-pleural effusion, progressive airspace consolidation bilaterally concerning for progressive pneumonia.  Diffuse interstitial thickening, ground glass attenuation.  4 mm nodule in the right upper lobe. I have reviewed the images personally.  PFTs:  Labs:  Assessment and Plan Assessment & Plan Lung cancer on chemotherapy Lung cancer is being treated with carboplatin , Alimta , and Keytruda . Chemotherapy commenced post-hospitalization for pneumonia. Keytruda  poses a risk of pneumonitis, but the recent lung issue was unrelated to chemotherapy as it preceded treatment. Approximately 5-10% of patients on this regimen may experience lung issues. - Monitor for signs of pneumonitis due to Keytruda . - Review PET scan results after next chemotherapy session to assess lung status. - Schedule follow-up appointment in three months to review progress and scan results.  Recent pneumonia with bilateral pulmonary infiltrates, resolved Recent pneumonia with bilateral pulmonary infiltrates was treated with antibiotics and prednisone . Symptoms have resolved without the need for home oxygen. A positive fungitell blood test was likely a false positive as she improved with standard treatment. - Review PET scan results to ensure resolution of pulmonary infiltrates.  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  Chemotherapy-induced fatigue and decreased appetite Experiencing fatigue and decreased appetite, likely due to chemotherapy. - Encourage regular physical activity, such as walking. -  Recommend nutritional supplements like Ensure or Premier Protein to maintain nutritional intake.   Recommendations: Continue supportive care.  Review follow-up imaging  Lonna Coder MD [facility] 04/17/2024, 10:46 AM  CC: Dwight Trula SQUIBB, MD

## 2024-04-18 ENCOUNTER — Other Ambulatory Visit: Payer: Self-pay

## 2024-04-18 DIAGNOSIS — I700 Atherosclerosis of aorta: Secondary | ICD-10-CM | POA: Diagnosis not present

## 2024-04-18 DIAGNOSIS — I82441 Acute embolism and thrombosis of right tibial vein: Secondary | ICD-10-CM | POA: Diagnosis not present

## 2024-04-18 DIAGNOSIS — E871 Hypo-osmolality and hyponatremia: Secondary | ICD-10-CM | POA: Diagnosis not present

## 2024-04-18 DIAGNOSIS — J9601 Acute respiratory failure with hypoxia: Secondary | ICD-10-CM | POA: Diagnosis not present

## 2024-04-18 DIAGNOSIS — C187 Malignant neoplasm of sigmoid colon: Secondary | ICD-10-CM | POA: Diagnosis not present

## 2024-04-18 DIAGNOSIS — Z7952 Long term (current) use of systemic steroids: Secondary | ICD-10-CM | POA: Diagnosis not present

## 2024-04-18 DIAGNOSIS — Z7901 Long term (current) use of anticoagulants: Secondary | ICD-10-CM | POA: Diagnosis not present

## 2024-04-18 DIAGNOSIS — E785 Hyperlipidemia, unspecified: Secondary | ICD-10-CM | POA: Diagnosis not present

## 2024-04-18 DIAGNOSIS — E876 Hypokalemia: Secondary | ICD-10-CM | POA: Diagnosis not present

## 2024-04-18 DIAGNOSIS — J189 Pneumonia, unspecified organism: Secondary | ICD-10-CM | POA: Diagnosis not present

## 2024-04-18 DIAGNOSIS — Z9181 History of falling: Secondary | ICD-10-CM | POA: Diagnosis not present

## 2024-04-18 DIAGNOSIS — M6281 Muscle weakness (generalized): Secondary | ICD-10-CM | POA: Diagnosis not present

## 2024-04-18 DIAGNOSIS — K5900 Constipation, unspecified: Secondary | ICD-10-CM | POA: Diagnosis not present

## 2024-04-18 DIAGNOSIS — Z792 Long term (current) use of antibiotics: Secondary | ICD-10-CM | POA: Diagnosis not present

## 2024-04-18 DIAGNOSIS — N1330 Unspecified hydronephrosis: Secondary | ICD-10-CM | POA: Diagnosis not present

## 2024-04-18 DIAGNOSIS — C3491 Malignant neoplasm of unspecified part of right bronchus or lung: Secondary | ICD-10-CM | POA: Diagnosis not present

## 2024-04-18 DIAGNOSIS — K219 Gastro-esophageal reflux disease without esophagitis: Secondary | ICD-10-CM | POA: Diagnosis not present

## 2024-04-18 DIAGNOSIS — Z87891 Personal history of nicotine dependence: Secondary | ICD-10-CM | POA: Diagnosis not present

## 2024-04-18 DIAGNOSIS — C786 Secondary malignant neoplasm of retroperitoneum and peritoneum: Secondary | ICD-10-CM | POA: Diagnosis not present

## 2024-04-18 DIAGNOSIS — Z90710 Acquired absence of both cervix and uterus: Secondary | ICD-10-CM | POA: Diagnosis not present

## 2024-04-18 DIAGNOSIS — D630 Anemia in neoplastic disease: Secondary | ICD-10-CM | POA: Diagnosis not present

## 2024-04-18 DIAGNOSIS — M8458XD Pathological fracture in neoplastic disease, other specified site, subsequent encounter for fracture with routine healing: Secondary | ICD-10-CM | POA: Diagnosis not present

## 2024-04-18 DIAGNOSIS — C7951 Secondary malignant neoplasm of bone: Secondary | ICD-10-CM | POA: Diagnosis not present

## 2024-04-18 DIAGNOSIS — E860 Dehydration: Secondary | ICD-10-CM | POA: Diagnosis not present

## 2024-04-18 DIAGNOSIS — K589 Irritable bowel syndrome without diarrhea: Secondary | ICD-10-CM | POA: Diagnosis not present

## 2024-04-26 DIAGNOSIS — Z7901 Long term (current) use of anticoagulants: Secondary | ICD-10-CM | POA: Diagnosis not present

## 2024-04-26 DIAGNOSIS — C7951 Secondary malignant neoplasm of bone: Secondary | ICD-10-CM | POA: Diagnosis not present

## 2024-04-26 DIAGNOSIS — K5900 Constipation, unspecified: Secondary | ICD-10-CM | POA: Diagnosis not present

## 2024-04-26 DIAGNOSIS — Z792 Long term (current) use of antibiotics: Secondary | ICD-10-CM | POA: Diagnosis not present

## 2024-04-26 DIAGNOSIS — N1330 Unspecified hydronephrosis: Secondary | ICD-10-CM | POA: Diagnosis not present

## 2024-04-26 DIAGNOSIS — Z9181 History of falling: Secondary | ICD-10-CM | POA: Diagnosis not present

## 2024-04-26 DIAGNOSIS — Z90710 Acquired absence of both cervix and uterus: Secondary | ICD-10-CM | POA: Diagnosis not present

## 2024-04-26 DIAGNOSIS — K589 Irritable bowel syndrome without diarrhea: Secondary | ICD-10-CM | POA: Diagnosis not present

## 2024-04-26 DIAGNOSIS — C3491 Malignant neoplasm of unspecified part of right bronchus or lung: Secondary | ICD-10-CM | POA: Diagnosis not present

## 2024-04-26 DIAGNOSIS — J189 Pneumonia, unspecified organism: Secondary | ICD-10-CM | POA: Diagnosis not present

## 2024-04-26 DIAGNOSIS — E876 Hypokalemia: Secondary | ICD-10-CM | POA: Diagnosis not present

## 2024-04-26 DIAGNOSIS — M6281 Muscle weakness (generalized): Secondary | ICD-10-CM | POA: Diagnosis not present

## 2024-04-26 DIAGNOSIS — Z7952 Long term (current) use of systemic steroids: Secondary | ICD-10-CM | POA: Diagnosis not present

## 2024-04-26 DIAGNOSIS — M8458XD Pathological fracture in neoplastic disease, other specified site, subsequent encounter for fracture with routine healing: Secondary | ICD-10-CM | POA: Diagnosis not present

## 2024-04-26 DIAGNOSIS — J9601 Acute respiratory failure with hypoxia: Secondary | ICD-10-CM | POA: Diagnosis not present

## 2024-04-26 DIAGNOSIS — C187 Malignant neoplasm of sigmoid colon: Secondary | ICD-10-CM | POA: Diagnosis not present

## 2024-04-26 DIAGNOSIS — E860 Dehydration: Secondary | ICD-10-CM | POA: Diagnosis not present

## 2024-04-26 DIAGNOSIS — Z87891 Personal history of nicotine dependence: Secondary | ICD-10-CM | POA: Diagnosis not present

## 2024-04-26 DIAGNOSIS — E785 Hyperlipidemia, unspecified: Secondary | ICD-10-CM | POA: Diagnosis not present

## 2024-04-26 DIAGNOSIS — E871 Hypo-osmolality and hyponatremia: Secondary | ICD-10-CM | POA: Diagnosis not present

## 2024-04-26 DIAGNOSIS — C786 Secondary malignant neoplasm of retroperitoneum and peritoneum: Secondary | ICD-10-CM | POA: Diagnosis not present

## 2024-04-26 DIAGNOSIS — D630 Anemia in neoplastic disease: Secondary | ICD-10-CM | POA: Diagnosis not present

## 2024-04-26 DIAGNOSIS — I82441 Acute embolism and thrombosis of right tibial vein: Secondary | ICD-10-CM | POA: Diagnosis not present

## 2024-04-26 DIAGNOSIS — K219 Gastro-esophageal reflux disease without esophagitis: Secondary | ICD-10-CM | POA: Diagnosis not present

## 2024-04-26 DIAGNOSIS — I700 Atherosclerosis of aorta: Secondary | ICD-10-CM | POA: Diagnosis not present

## 2024-04-27 ENCOUNTER — Ambulatory Visit (HOSPITAL_COMMUNITY)
Admission: RE | Admit: 2024-04-27 | Discharge: 2024-04-27 | Disposition: A | Discharge status: Home / Self Care | Source: Ambulatory Visit | Attending: Hematology & Oncology | Admitting: Hematology & Oncology

## 2024-04-27 ENCOUNTER — Ambulatory Visit: Payer: Self-pay | Admitting: Hematology & Oncology

## 2024-04-27 DIAGNOSIS — I82621 Acute embolism and thrombosis of deep veins of right upper extremity: Secondary | ICD-10-CM | POA: Insufficient documentation

## 2024-05-01 ENCOUNTER — Other Ambulatory Visit: Payer: Self-pay | Admitting: *Deleted

## 2024-05-01 DIAGNOSIS — C3491 Malignant neoplasm of unspecified part of right bronchus or lung: Secondary | ICD-10-CM

## 2024-05-01 MED ORDER — DEXAMETHASONE 4 MG PO TABS: ORAL_TABLET | ORAL | 1 refills | Status: DC

## 2024-05-03 ENCOUNTER — Encounter: Payer: Self-pay | Admitting: Hematology & Oncology

## 2024-05-03 ENCOUNTER — Inpatient Hospital Stay

## 2024-05-03 ENCOUNTER — Inpatient Hospital Stay: Admitting: Hematology & Oncology

## 2024-05-03 ENCOUNTER — Ambulatory Visit (HOSPITAL_BASED_OUTPATIENT_CLINIC_OR_DEPARTMENT_OTHER)
Admission: RE | Admit: 2024-05-03 | Discharge: 2024-05-03 | Disposition: A | Discharge status: Home / Self Care | Source: Ambulatory Visit | Attending: Hematology & Oncology | Admitting: Hematology & Oncology

## 2024-05-03 VITALS — BP 108/88 | HR 107 | Temp 98.3°F | Resp 19

## 2024-05-03 VITALS — BP 125/80 | HR 68 | Temp 98.3°F | Resp 18 | Wt 108.0 lb

## 2024-05-03 DIAGNOSIS — C3491 Malignant neoplasm of unspecified part of right bronchus or lung: Secondary | ICD-10-CM

## 2024-05-03 DIAGNOSIS — Z79620 Long term (current) use of immunosuppressive biologic: Secondary | ICD-10-CM | POA: Diagnosis not present

## 2024-05-03 DIAGNOSIS — Z79899 Other long term (current) drug therapy: Secondary | ICD-10-CM | POA: Diagnosis not present

## 2024-05-03 DIAGNOSIS — Z87891 Personal history of nicotine dependence: Secondary | ICD-10-CM | POA: Diagnosis not present

## 2024-05-03 DIAGNOSIS — Z5112 Encounter for antineoplastic immunotherapy: Secondary | ICD-10-CM | POA: Diagnosis not present

## 2024-05-03 DIAGNOSIS — I82621 Acute embolism and thrombosis of deep veins of right upper extremity: Secondary | ICD-10-CM

## 2024-05-03 DIAGNOSIS — M25571 Pain in right ankle and joints of right foot: Secondary | ICD-10-CM | POA: Diagnosis not present

## 2024-05-03 DIAGNOSIS — Z5111 Encounter for antineoplastic chemotherapy: Secondary | ICD-10-CM | POA: Diagnosis not present

## 2024-05-03 DIAGNOSIS — C7951 Secondary malignant neoplasm of bone: Secondary | ICD-10-CM | POA: Diagnosis not present

## 2024-05-03 LAB — CMP (CANCER CENTER ONLY)
ALT: 15 U/L (ref 0–44)
AST: 18 U/L (ref 15–41)
Albumin: 3.8 g/dL (ref 3.5–5.0)
Alkaline Phosphatase: 70 U/L (ref 38–126)
Anion gap: 14 (ref 5–15)
BUN: 24 mg/dL — ABNORMAL HIGH (ref 8–23)
CO2: 22 mmol/L (ref 22–32)
Calcium: 9.2 mg/dL (ref 8.9–10.3)
Chloride: 104 mmol/L (ref 98–111)
Creatinine: 0.75 mg/dL (ref 0.44–1.00)
GFR, Estimated: >60 mL/min (ref >60)
Glucose, Bld: 125 mg/dL — ABNORMAL HIGH (ref 70–99)
Potassium: 4.5 mmol/L (ref 3.5–5.1)
Sodium: 140 mmol/L (ref 135–145)
Total Bilirubin: 0.2 mg/dL (ref 0.0–1.2)
Total Protein: 6.5 g/dL (ref 6.5–8.1)

## 2024-05-03 LAB — CBC WITH DIFFERENTIAL (CANCER CENTER ONLY)
Abs Immature Granulocytes: 0.22 K/uL — ABNORMAL HIGH (ref 0.00–0.07)
Basophils Absolute: 0 K/uL (ref 0.0–0.1)
Basophils Relative: 0 %
Eosinophils Absolute: 0 K/uL (ref 0.0–0.5)
Eosinophils Relative: 0 %
HCT: 29.4 % — ABNORMAL LOW (ref 36.0–46.0)
Hemoglobin: 9.3 g/dL — ABNORMAL LOW (ref 12.0–15.0)
Immature Granulocytes: 4 %
Lymphocytes Relative: 7 %
Lymphs Abs: 0.4 K/uL — ABNORMAL LOW (ref 0.7–4.0)
MCH: 28.9 pg (ref 26.0–34.0)
MCHC: 31.6 g/dL (ref 30.0–36.0)
MCV: 91.3 fL (ref 80.0–100.0)
Monocytes Absolute: 0.7 K/uL (ref 0.1–1.0)
Monocytes Relative: 11 %
Neutro Abs: 4.9 K/uL (ref 1.7–7.7)
Neutrophils Relative %: 78 %
Platelet Count: 386 K/uL (ref 150–400)
RBC: 3.22 MIL/uL — ABNORMAL LOW (ref 3.87–5.11)
RDW: 17.4 % — ABNORMAL HIGH (ref 11.5–15.5)
WBC Count: 6.3 K/uL (ref 4.0–10.5)
nRBC: 0.3 % — ABNORMAL HIGH (ref 0.0–0.2)

## 2024-05-03 MED ORDER — DEXAMETHASONE SODIUM PHOSPHATE 10 MG/ML IJ SOLN
10.0000 mg | Freq: Once | INTRAMUSCULAR | Status: AC
  Administered 2024-05-03: 10 mg via INTRAVENOUS
  Filled 2024-05-03: qty 1

## 2024-05-03 MED ORDER — SODIUM CHLORIDE 0.9 % IV SOLN
341.0000 mg | Freq: Once | INTRAVENOUS | Status: AC
  Administered 2024-05-03: 340 mg via INTRAVENOUS
  Filled 2024-05-03: qty 34

## 2024-05-03 MED ORDER — SODIUM CHLORIDE 0.9 % IV SOLN
200.0000 mg | Freq: Once | INTRAVENOUS | Status: AC
  Administered 2024-05-03: 200 mg via INTRAVENOUS
  Filled 2024-05-03: qty 8

## 2024-05-03 MED ORDER — PALONOSETRON HCL INJECTION 0.25 MG/5ML
0.2500 mg | Freq: Once | INTRAVENOUS | Status: AC
  Administered 2024-05-03: 0.25 mg via INTRAVENOUS
  Filled 2024-05-03: qty 5

## 2024-05-03 MED ORDER — APREPITANT 130 MG/18ML IV EMUL
130.0000 mg | Freq: Once | INTRAVENOUS | Status: AC
  Administered 2024-05-03: 130 mg via INTRAVENOUS
  Filled 2024-05-03: qty 18

## 2024-05-03 MED ORDER — SODIUM CHLORIDE 0.9 % IV SOLN
500.0000 mg/m2 | Freq: Once | INTRAVENOUS | Status: AC
  Administered 2024-05-03: 700 mg via INTRAVENOUS
  Filled 2024-05-03: qty 20

## 2024-05-03 MED ORDER — SODIUM CHLORIDE 0.9 % IV SOLN: INTRAVENOUS | Status: DC

## 2024-05-03 MED ORDER — CYANOCOBALAMIN 1000 MCG/ML IJ SOLN
1000.0000 ug | Freq: Once | INTRAMUSCULAR | Status: AC
  Administered 2024-05-03: 1000 ug via INTRAMUSCULAR
  Filled 2024-05-03: qty 1

## 2024-05-03 NOTE — Progress Notes (Signed)
 Hematology and Oncology Follow Up Visit  Regina Young [id number] 1955/10/12 68 y.o. 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  She received 5000 rad to the lung and 3000 rad to the spine. Xgeva  120 mg subcu every 3 months-next dose 06/2024 Carbo/Alimta /Keytruda  -- s/p cycle #2 - start on 03/21/2024 Eliquis  5 mg p.o. twice daily --start 02/01/2024     Interim History:  Regina Young is in for follow-up.  She is managing about as well as expected.  However, she recently had a fall.  She hurt her right ankle.  She is worried that the ankle is broken.  She is able to walk on it.  I was able to move it.  However, she would like to have x-rays to make sure that there is no problem there.  She still has issues with radicular pain.  This is in the right leg.  Again I suspect this probably is from the tumor in the sacrum.  She seems to have tolerated chemotherapy pretty well.  She really has had no problems with the chemotherapy.  She has had no cough.  She has had no bleeding.  There is been no change in bowel or bladder habits.  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  She has had no headache.  There is been no mouth sores.  Overall, I would have to say that her performance status is probably ECOG 1.    Medications:  Current Outpatient Medications:    apixaban  (ELIQUIS ) 5 MG TABS tablet, Take 1 tablet (5 mg total) by mouth 2 (two) times daily., Disp: 60 tablet, Rfl: 3   Calcium  Citrate 250 MG TABS, Take 250 mg by mouth in the morning., Disp: , Rfl:    Cholecalciferol  (VITAMIN D3) 50 MCG (2000 UT) TABS, Take 2,000 Units by mouth daily with breakfast., Disp: , Rfl:     cyanocobalamin  (VITAMIN B12) 1000 MCG tablet, Take 1,000 mcg by mouth daily., Disp: , Rfl:    dexamethasone  (DECADRON ) 4 MG tablet, Take 1 tab 2 times daily starting day before pemetrexed . Then take 2 tabs daily x 3 days starting day after carboplatin . Take with food., Disp: 30 tablet, Rfl: 1   fluticasone  (FLONASE ) 50 MCG/ACT nasal spray, Place 2 sprays into both nostrils daily. (Patient taking differently: Place 2 sprays into both nostrils daily as needed.), Disp: 16 g, Rfl: 0   folic acid  (FOLVITE ) 1 MG tablet, Take 1 tablet (1 mg total) by mouth daily. Start 7 days before pemetrexed  chemotherapy. Continue until 21 days after pemetrexed  completed., Disp: 100 tablet, Rfl: 3   gabapentin  (NEURONTIN ) 300 MG capsule, TAKE 1 CAPSULE(300 MG) BY MOUTH THREE TIMES DAILY, Disp: 90 capsule, Rfl: 1   ipratropium-albuterol  (DUONEB) 0.5-2.5 (3) MG/3ML SOLN, Take 3 mLs by nebulization 3 (three) times daily for 7 days, THEN 3 mLs every 6 (six) hours as needed., Disp: 360 mL, Rfl: 1   morphine  (MS CONTIN ) 15 MG 12 hr tablet, Take 1 tablet (15 mg total) by mouth every 12 (twelve) hours., Disp: 60 tablet, Rfl: 0   omeprazole  (PRILOSEC ) 20 MG capsule, Take 2 capsules (40 mg total) by mouth daily before breakfast., Disp: 60 capsule, Rfl: 1   ondansetron  (ZOFRAN ) 4 MG tablet,  Take 1 tablet (4 mg total) by mouth every 6 (six) hours as needed for nausea., Disp: 20 tablet, Rfl: 0   ondansetron  (ZOFRAN ) 8 MG tablet, Take 1 tablet (8 mg total) by mouth every 8 (eight) hours as needed for nausea or vomiting. Start on the third day after carboplatin ., Disp: 30 tablet, Rfl: 1   oxyCODONE  (OXY IR/ROXICODONE ) 5 MG immediate release tablet, Take one to two tablets every six hours as needed for pain, Disp: 90 tablet, Rfl: 0   prochlorperazine  (COMPAZINE ) 10 MG tablet, TAKE 1 TABLET(10 MG) BY MOUTH EVERY 6 HOURS AS NEEDED FOR NAUSEA OR VOMITING, Disp: 30 tablet, Rfl: 1   rosuvastatin  (CRESTOR ) 5 MG tablet, Take 5 mg by mouth in the  morning., Disp: , Rfl:   Allergies: No Known Allergies  Past Medical History, Surgical history, Social history, and Family History were reviewed and updated.  Review of Systems: Review of Systems  Constitutional: Negative.   HENT:  Negative.    Eyes: Negative.   Respiratory: Negative.    Cardiovascular: Negative.   Gastrointestinal:  Positive for abdominal pain.  Endocrine: Negative.   Genitourinary: Negative.    Musculoskeletal:  Positive for back pain.  Neurological: Negative.   Hematological: Negative.   Psychiatric/Behavioral: Negative.      Physical Exam:  oral temperature is 98.3 F (36.8 C). Her blood pressure is 108/88 and her pulse is 107 (abnormal). Her respiration is 19 and oxygen saturation is 100%.   Wt Readings from Last 3 Encounters:  04/17/24 105 lb 12.8 oz (48 kg)  04/12/24 108 lb (49 kg)  03/23/24 107 lb 6 oz (48.7 kg)    Physical Exam Vitals reviewed.  HENT:     Head: Normocephalic and atraumatic.  Eyes:     Pupils: Pupils are equal, round, and reactive to light.  Cardiovascular:     Rate and Rhythm: Normal rate and regular rhythm.     Heart sounds: Normal heart sounds.  Pulmonary:     Effort: Pulmonary effort is normal.     Breath sounds: Normal breath sounds.  Abdominal:     General: Bowel sounds are normal.     Palpations: Abdomen is soft.  Musculoskeletal:        General: No tenderness or deformity. Normal range of motion.     Cervical back: Normal range of motion.  Lymphadenopathy:     Cervical: No cervical adenopathy.  Skin:    General: Skin is warm and dry.     Findings: No erythema or rash.  Neurological:     Mental Status: She is alert and oriented to person, place, and time.  Psychiatric:        Behavior: Behavior normal.        Thought Content: Thought content normal.        Judgment: Judgment normal.      Lab Results  Component Value Date   WBC 6.3 05/03/2024   HGB 9.3 (L) 05/03/2024   HCT 29.4 (L) 05/03/2024   MCV  91.3 05/03/2024   PLT 386 05/03/2024     Chemistry      Component Value Date/Time   NA 140 05/03/2024 0948   K 4.5 05/03/2024 0948   CL 104 05/03/2024 0948   CO2 22 05/03/2024 0948   BUN 24 (H) 05/03/2024 0948   CREATININE 0.75 05/03/2024 0948      Component Value Date/Time   CALCIUM  9.2 05/03/2024 0948   ALKPHOS 70 05/03/2024 0948   AST 18 05/03/2024 0948  ALT 15 05/03/2024 0948   BILITOT 0.2 05/03/2024 0948      Impression and Plan: Regina Young is a very charming 69 year old white female.  She has a past history of an early stage colon cancer.  However, now it looks like she has a metastatic non-small cell lung cancer-adenocarcinoma.  She completed the radiotherapy for the sacral metastasis and the lung primary.  We will go ahead with her third cycle of chemoimmunotherapy.  After this cycle, I will then plan for a follow-up PET scan to see how she is doing.  Hopefully, we will see that she is responding.  The main goal is to try to get a response down in the sacrum where she initially presented and had severe pain and the radiculopathy.  We will go ahead and get the x-ray of her right ankle.  Again I doubt that there is any fracture.  She will continue on the Eliquis .  I will plan to give her extra week off from treatment.  We will see about getting the PET scan in about 3 weeks.   Maude JONELLE Crease, MD 8/27/202511:08 AM

## 2024-05-03 NOTE — Patient Instructions (Signed)
 [facility] - A DEPT OF [facility]  Discharge Instructions: Thank you for choosing [facility] to provide your oncology and hematology care.   If you have a lab appointment with the Cancer Center, please go directly to the Cancer Center and check in at the registration area.  Wear comfortable clothing and clothing appropriate for easy access to any Portacath or PICC line.   We strive to give you quality time with your provider. You may need to reschedule your appointment if you arrive late (15 or more minutes).  Arriving late affects you and other patients whose appointments are after yours.  Also, if you miss three or more appointments without notifying the office, you may be dismissed from the clinic at the provider's discretion.      For prescription refill requests, have your pharmacy contact our office and allow 72 hours for refills to be completed.    Today you received the following chemotherapy and/or immunotherapy agents Carboplatin , Keytruda , Alimta       To help prevent nausea and vomiting after your treatment, we encourage you to take your nausea medication as directed.  BELOW ARE SYMPTOMS THAT SHOULD BE REPORTED IMMEDIATELY: *FEVER GREATER THAN 100.4 F (38 C) OR HIGHER *CHILLS OR SWEATING *NAUSEA AND VOMITING THAT IS NOT CONTROLLED WITH YOUR NAUSEA MEDICATION *UNUSUAL SHORTNESS OF BREATH *UNUSUAL BRUISING OR BLEEDING *URINARY PROBLEMS (pain or burning when urinating, or frequent urination) *BOWEL PROBLEMS (unusual diarrhea, constipation, pain near the anus) TENDERNESS IN MOUTH AND THROAT WITH OR WITHOUT PRESENCE OF ULCERS (sore throat, sores in mouth, or a toothache) UNUSUAL RASH, SWELLING OR PAIN  UNUSUAL VAGINAL DISCHARGE OR ITCHING   Items with * indicate a potential emergency and should be followed up as soon as possible or go to the Emergency Department if any problems should occur.  Please show the CHEMOTHERAPY ALERT CARD  or IMMUNOTHERAPY ALERT CARD at check-in to the Emergency Department and triage nurse. Should you have questions after your visit or need to cancel or reschedule your appointment, please contact [facility] - A DEPT OF [facility]  [phone] and follow the prompts.  Office hours are 8:00 a.m. to 4:30 p.m. Monday - Friday. Please note that voicemails left after 4:00 p.m. may not be returned until the following business day.  We are closed weekends and major holidays. You have access to a nurse at all times for urgent questions. Please call the main number to the clinic [phone] and follow the prompts.  For any non-urgent questions, you may also contact your provider using MyChart. We now offer e-Visits for anyone 47 and older to request care online for non-urgent symptoms. For details visit [email or web address].   Also download the MyChart app! Go to the app store, search MyChart, open the app, select [facility], and log in with your MyChart username and password.

## 2024-05-03 NOTE — Patient Instructions (Signed)

## 2024-05-04 ENCOUNTER — Encounter: Payer: Self-pay | Admitting: *Deleted

## 2024-05-04 ENCOUNTER — Other Ambulatory Visit: Payer: Self-pay | Admitting: *Deleted

## 2024-05-04 DIAGNOSIS — C3491 Malignant neoplasm of unspecified part of right bronchus or lung: Secondary | ICD-10-CM

## 2024-05-04 DIAGNOSIS — C187 Malignant neoplasm of sigmoid colon: Secondary | ICD-10-CM

## 2024-05-04 DIAGNOSIS — C3490 Malignant neoplasm of unspecified part of unspecified bronchus or lung: Secondary | ICD-10-CM

## 2024-05-04 DIAGNOSIS — C786 Secondary malignant neoplasm of retroperitoneum and peritoneum: Secondary | ICD-10-CM

## 2024-05-04 MED ORDER — OXYCODONE HCL 5 MG PO TABS: ORAL_TABLET | ORAL | 0 refills | Status: DC

## 2024-05-04 NOTE — Progress Notes (Signed)
 Patient feeling well except for a sore ankle from an earlier fall. Dr Timmy will order xrays. She received cycle three. She will now need a PET prior to her next treatment. Scheduled for 9/19.  Oncology Nurse Navigator Documentation     05/04/2024    8:15 AM  Oncology Nurse Navigator Flowsheets  Navigator Follow Up Date: 05/26/2024  Navigator Follow Up Reason: Scan Review  Navigator Location [facility]  Navigator Encounter Type Treatment  Patient Visit Type MedOnc  Treatment Phase Active Tx  Barriers/Navigation Needs Coordination of Care;Education  Interventions Psycho-Social Support;Medication Assistance  Acuity Level 2-Minimal Needs (1-2 Barriers Identified)  Support Groups/Services Friends and Family  Time Spent with Patient 15

## 2024-05-09 ENCOUNTER — Encounter: Payer: Self-pay | Admitting: Hematology & Oncology

## 2024-05-10 DIAGNOSIS — C3491 Malignant neoplasm of unspecified part of right bronchus or lung: Secondary | ICD-10-CM | POA: Diagnosis not present

## 2024-05-10 DIAGNOSIS — E876 Hypokalemia: Secondary | ICD-10-CM | POA: Diagnosis not present

## 2024-05-10 DIAGNOSIS — Z90710 Acquired absence of both cervix and uterus: Secondary | ICD-10-CM | POA: Diagnosis not present

## 2024-05-10 DIAGNOSIS — Z7952 Long term (current) use of systemic steroids: Secondary | ICD-10-CM | POA: Diagnosis not present

## 2024-05-10 DIAGNOSIS — C786 Secondary malignant neoplasm of retroperitoneum and peritoneum: Secondary | ICD-10-CM | POA: Diagnosis not present

## 2024-05-10 DIAGNOSIS — Z9181 History of falling: Secondary | ICD-10-CM | POA: Diagnosis not present

## 2024-05-10 DIAGNOSIS — N1330 Unspecified hydronephrosis: Secondary | ICD-10-CM | POA: Diagnosis not present

## 2024-05-10 DIAGNOSIS — Z87891 Personal history of nicotine dependence: Secondary | ICD-10-CM | POA: Diagnosis not present

## 2024-05-10 DIAGNOSIS — J9601 Acute respiratory failure with hypoxia: Secondary | ICD-10-CM | POA: Diagnosis not present

## 2024-05-10 DIAGNOSIS — Z7901 Long term (current) use of anticoagulants: Secondary | ICD-10-CM | POA: Diagnosis not present

## 2024-05-10 DIAGNOSIS — M8458XD Pathological fracture in neoplastic disease, other specified site, subsequent encounter for fracture with routine healing: Secondary | ICD-10-CM | POA: Diagnosis not present

## 2024-05-10 DIAGNOSIS — I700 Atherosclerosis of aorta: Secondary | ICD-10-CM | POA: Diagnosis not present

## 2024-05-10 DIAGNOSIS — J189 Pneumonia, unspecified organism: Secondary | ICD-10-CM | POA: Diagnosis not present

## 2024-05-10 DIAGNOSIS — M6281 Muscle weakness (generalized): Secondary | ICD-10-CM | POA: Diagnosis not present

## 2024-05-10 DIAGNOSIS — D630 Anemia in neoplastic disease: Secondary | ICD-10-CM | POA: Diagnosis not present

## 2024-05-10 DIAGNOSIS — Z792 Long term (current) use of antibiotics: Secondary | ICD-10-CM | POA: Diagnosis not present

## 2024-05-10 DIAGNOSIS — K5900 Constipation, unspecified: Secondary | ICD-10-CM | POA: Diagnosis not present

## 2024-05-10 DIAGNOSIS — K219 Gastro-esophageal reflux disease without esophagitis: Secondary | ICD-10-CM | POA: Diagnosis not present

## 2024-05-10 DIAGNOSIS — I82441 Acute embolism and thrombosis of right tibial vein: Secondary | ICD-10-CM | POA: Diagnosis not present

## 2024-05-10 DIAGNOSIS — C187 Malignant neoplasm of sigmoid colon: Secondary | ICD-10-CM | POA: Diagnosis not present

## 2024-05-10 DIAGNOSIS — E785 Hyperlipidemia, unspecified: Secondary | ICD-10-CM | POA: Diagnosis not present

## 2024-05-10 DIAGNOSIS — E860 Dehydration: Secondary | ICD-10-CM | POA: Diagnosis not present

## 2024-05-10 DIAGNOSIS — E871 Hypo-osmolality and hyponatremia: Secondary | ICD-10-CM | POA: Diagnosis not present

## 2024-05-10 DIAGNOSIS — C7951 Secondary malignant neoplasm of bone: Secondary | ICD-10-CM | POA: Diagnosis not present

## 2024-05-10 DIAGNOSIS — K589 Irritable bowel syndrome without diarrhea: Secondary | ICD-10-CM | POA: Diagnosis not present

## 2024-05-11 DIAGNOSIS — K589 Irritable bowel syndrome without diarrhea: Secondary | ICD-10-CM | POA: Diagnosis not present

## 2024-05-11 DIAGNOSIS — M6281 Muscle weakness (generalized): Secondary | ICD-10-CM | POA: Diagnosis not present

## 2024-05-11 DIAGNOSIS — Z7952 Long term (current) use of systemic steroids: Secondary | ICD-10-CM | POA: Diagnosis not present

## 2024-05-11 DIAGNOSIS — E871 Hypo-osmolality and hyponatremia: Secondary | ICD-10-CM | POA: Diagnosis not present

## 2024-05-11 DIAGNOSIS — Z9181 History of falling: Secondary | ICD-10-CM | POA: Diagnosis not present

## 2024-05-11 DIAGNOSIS — E876 Hypokalemia: Secondary | ICD-10-CM | POA: Diagnosis not present

## 2024-05-11 DIAGNOSIS — C7951 Secondary malignant neoplasm of bone: Secondary | ICD-10-CM | POA: Diagnosis not present

## 2024-05-11 DIAGNOSIS — J189 Pneumonia, unspecified organism: Secondary | ICD-10-CM | POA: Diagnosis not present

## 2024-05-11 DIAGNOSIS — J9601 Acute respiratory failure with hypoxia: Secondary | ICD-10-CM | POA: Diagnosis not present

## 2024-05-11 DIAGNOSIS — Z90710 Acquired absence of both cervix and uterus: Secondary | ICD-10-CM | POA: Diagnosis not present

## 2024-05-11 DIAGNOSIS — K5900 Constipation, unspecified: Secondary | ICD-10-CM | POA: Diagnosis not present

## 2024-05-11 DIAGNOSIS — C786 Secondary malignant neoplasm of retroperitoneum and peritoneum: Secondary | ICD-10-CM | POA: Diagnosis not present

## 2024-05-11 DIAGNOSIS — C187 Malignant neoplasm of sigmoid colon: Secondary | ICD-10-CM | POA: Diagnosis not present

## 2024-05-11 DIAGNOSIS — E860 Dehydration: Secondary | ICD-10-CM | POA: Diagnosis not present

## 2024-05-11 DIAGNOSIS — M8458XD Pathological fracture in neoplastic disease, other specified site, subsequent encounter for fracture with routine healing: Secondary | ICD-10-CM | POA: Diagnosis not present

## 2024-05-11 DIAGNOSIS — N1330 Unspecified hydronephrosis: Secondary | ICD-10-CM | POA: Diagnosis not present

## 2024-05-11 DIAGNOSIS — I82441 Acute embolism and thrombosis of right tibial vein: Secondary | ICD-10-CM | POA: Diagnosis not present

## 2024-05-11 DIAGNOSIS — D630 Anemia in neoplastic disease: Secondary | ICD-10-CM | POA: Diagnosis not present

## 2024-05-11 DIAGNOSIS — C3491 Malignant neoplasm of unspecified part of right bronchus or lung: Secondary | ICD-10-CM | POA: Diagnosis not present

## 2024-05-11 DIAGNOSIS — Z7901 Long term (current) use of anticoagulants: Secondary | ICD-10-CM | POA: Diagnosis not present

## 2024-05-11 DIAGNOSIS — Z87891 Personal history of nicotine dependence: Secondary | ICD-10-CM | POA: Diagnosis not present

## 2024-05-11 DIAGNOSIS — Z792 Long term (current) use of antibiotics: Secondary | ICD-10-CM | POA: Diagnosis not present

## 2024-05-11 DIAGNOSIS — I700 Atherosclerosis of aorta: Secondary | ICD-10-CM | POA: Diagnosis not present

## 2024-05-11 DIAGNOSIS — E785 Hyperlipidemia, unspecified: Secondary | ICD-10-CM | POA: Diagnosis not present

## 2024-05-11 DIAGNOSIS — K219 Gastro-esophageal reflux disease without esophagitis: Secondary | ICD-10-CM | POA: Diagnosis not present

## 2024-05-12 ENCOUNTER — Other Ambulatory Visit: Payer: Self-pay

## 2024-05-12 DIAGNOSIS — J189 Pneumonia, unspecified organism: Secondary | ICD-10-CM | POA: Diagnosis not present

## 2024-05-12 DIAGNOSIS — Z7952 Long term (current) use of systemic steroids: Secondary | ICD-10-CM | POA: Diagnosis not present

## 2024-05-12 DIAGNOSIS — I82441 Acute embolism and thrombosis of right tibial vein: Secondary | ICD-10-CM | POA: Diagnosis not present

## 2024-05-12 DIAGNOSIS — K5900 Constipation, unspecified: Secondary | ICD-10-CM | POA: Diagnosis not present

## 2024-05-12 DIAGNOSIS — C3491 Malignant neoplasm of unspecified part of right bronchus or lung: Secondary | ICD-10-CM

## 2024-05-12 DIAGNOSIS — I700 Atherosclerosis of aorta: Secondary | ICD-10-CM | POA: Diagnosis not present

## 2024-05-12 DIAGNOSIS — J9601 Acute respiratory failure with hypoxia: Secondary | ICD-10-CM | POA: Diagnosis not present

## 2024-05-12 DIAGNOSIS — C187 Malignant neoplasm of sigmoid colon: Secondary | ICD-10-CM

## 2024-05-12 DIAGNOSIS — Z9181 History of falling: Secondary | ICD-10-CM | POA: Diagnosis not present

## 2024-05-12 DIAGNOSIS — Z792 Long term (current) use of antibiotics: Secondary | ICD-10-CM | POA: Diagnosis not present

## 2024-05-12 DIAGNOSIS — E785 Hyperlipidemia, unspecified: Secondary | ICD-10-CM | POA: Diagnosis not present

## 2024-05-12 DIAGNOSIS — Z87891 Personal history of nicotine dependence: Secondary | ICD-10-CM | POA: Diagnosis not present

## 2024-05-12 DIAGNOSIS — C786 Secondary malignant neoplasm of retroperitoneum and peritoneum: Secondary | ICD-10-CM

## 2024-05-12 DIAGNOSIS — Z7901 Long term (current) use of anticoagulants: Secondary | ICD-10-CM | POA: Diagnosis not present

## 2024-05-12 DIAGNOSIS — C3490 Malignant neoplasm of unspecified part of unspecified bronchus or lung: Secondary | ICD-10-CM

## 2024-05-12 DIAGNOSIS — M6281 Muscle weakness (generalized): Secondary | ICD-10-CM | POA: Diagnosis not present

## 2024-05-12 DIAGNOSIS — D630 Anemia in neoplastic disease: Secondary | ICD-10-CM | POA: Diagnosis not present

## 2024-05-12 DIAGNOSIS — E876 Hypokalemia: Secondary | ICD-10-CM | POA: Diagnosis not present

## 2024-05-12 DIAGNOSIS — K589 Irritable bowel syndrome without diarrhea: Secondary | ICD-10-CM | POA: Diagnosis not present

## 2024-05-12 DIAGNOSIS — Z90710 Acquired absence of both cervix and uterus: Secondary | ICD-10-CM | POA: Diagnosis not present

## 2024-05-12 DIAGNOSIS — E860 Dehydration: Secondary | ICD-10-CM | POA: Diagnosis not present

## 2024-05-12 DIAGNOSIS — N1330 Unspecified hydronephrosis: Secondary | ICD-10-CM | POA: Diagnosis not present

## 2024-05-12 DIAGNOSIS — C7951 Secondary malignant neoplasm of bone: Secondary | ICD-10-CM | POA: Diagnosis not present

## 2024-05-12 DIAGNOSIS — K219 Gastro-esophageal reflux disease without esophagitis: Secondary | ICD-10-CM | POA: Diagnosis not present

## 2024-05-12 DIAGNOSIS — E871 Hypo-osmolality and hyponatremia: Secondary | ICD-10-CM | POA: Diagnosis not present

## 2024-05-12 DIAGNOSIS — M8458XD Pathological fracture in neoplastic disease, other specified site, subsequent encounter for fracture with routine healing: Secondary | ICD-10-CM | POA: Diagnosis not present

## 2024-05-12 MED ORDER — OXYCODONE HCL 5 MG PO TABS: ORAL_TABLET | ORAL | 0 refills | Status: DC

## 2024-05-12 MED ORDER — MORPHINE SULFATE ER 15 MG PO TBCR: 15.0000 mg | EXTENDED_RELEASE_TABLET | Freq: Two times a day (BID) | ORAL | 0 refills | Status: DC

## 2024-05-15 ENCOUNTER — Ambulatory Visit: Payer: Self-pay | Admitting: Hematology & Oncology

## 2024-05-18 DIAGNOSIS — E871 Hypo-osmolality and hyponatremia: Secondary | ICD-10-CM | POA: Diagnosis not present

## 2024-05-18 DIAGNOSIS — N1330 Unspecified hydronephrosis: Secondary | ICD-10-CM | POA: Diagnosis not present

## 2024-05-18 DIAGNOSIS — K589 Irritable bowel syndrome without diarrhea: Secondary | ICD-10-CM | POA: Diagnosis not present

## 2024-05-18 DIAGNOSIS — Z79891 Long term (current) use of opiate analgesic: Secondary | ICD-10-CM | POA: Diagnosis not present

## 2024-05-18 DIAGNOSIS — J9811 Atelectasis: Secondary | ICD-10-CM | POA: Diagnosis not present

## 2024-05-18 DIAGNOSIS — Z79899 Other long term (current) drug therapy: Secondary | ICD-10-CM | POA: Diagnosis not present

## 2024-05-18 DIAGNOSIS — K219 Gastro-esophageal reflux disease without esophagitis: Secondary | ICD-10-CM | POA: Diagnosis not present

## 2024-05-18 DIAGNOSIS — C187 Malignant neoplasm of sigmoid colon: Secondary | ICD-10-CM | POA: Diagnosis not present

## 2024-05-18 DIAGNOSIS — C3491 Malignant neoplasm of unspecified part of right bronchus or lung: Secondary | ICD-10-CM | POA: Diagnosis not present

## 2024-05-18 DIAGNOSIS — I700 Atherosclerosis of aorta: Secondary | ICD-10-CM | POA: Diagnosis not present

## 2024-05-18 DIAGNOSIS — K5900 Constipation, unspecified: Secondary | ICD-10-CM | POA: Diagnosis not present

## 2024-05-18 DIAGNOSIS — Z792 Long term (current) use of antibiotics: Secondary | ICD-10-CM | POA: Diagnosis not present

## 2024-05-18 DIAGNOSIS — D630 Anemia in neoplastic disease: Secondary | ICD-10-CM | POA: Diagnosis not present

## 2024-05-18 DIAGNOSIS — Z7983 Long term (current) use of bisphosphonates: Secondary | ICD-10-CM | POA: Diagnosis not present

## 2024-05-18 DIAGNOSIS — Z7952 Long term (current) use of systemic steroids: Secondary | ICD-10-CM | POA: Diagnosis not present

## 2024-05-18 DIAGNOSIS — Z7901 Long term (current) use of anticoagulants: Secondary | ICD-10-CM | POA: Diagnosis not present

## 2024-05-18 DIAGNOSIS — Z87891 Personal history of nicotine dependence: Secondary | ICD-10-CM | POA: Diagnosis not present

## 2024-05-18 DIAGNOSIS — C7951 Secondary malignant neoplasm of bone: Secondary | ICD-10-CM | POA: Diagnosis not present

## 2024-05-18 DIAGNOSIS — C786 Secondary malignant neoplasm of retroperitoneum and peritoneum: Secondary | ICD-10-CM | POA: Diagnosis not present

## 2024-05-18 DIAGNOSIS — E785 Hyperlipidemia, unspecified: Secondary | ICD-10-CM | POA: Diagnosis not present

## 2024-05-18 DIAGNOSIS — M8458XD Pathological fracture in neoplastic disease, other specified site, subsequent encounter for fracture with routine healing: Secondary | ICD-10-CM | POA: Diagnosis not present

## 2024-05-18 DIAGNOSIS — I82441 Acute embolism and thrombosis of right tibial vein: Secondary | ICD-10-CM | POA: Diagnosis not present

## 2024-05-18 DIAGNOSIS — I2510 Atherosclerotic heart disease of native coronary artery without angina pectoris: Secondary | ICD-10-CM | POA: Diagnosis not present

## 2024-05-18 DIAGNOSIS — E876 Hypokalemia: Secondary | ICD-10-CM | POA: Diagnosis not present

## 2024-05-18 DIAGNOSIS — I2584 Coronary atherosclerosis due to calcified coronary lesion: Secondary | ICD-10-CM | POA: Diagnosis not present

## 2024-05-24 ENCOUNTER — Inpatient Hospital Stay: Admitting: Hematology & Oncology

## 2024-05-24 ENCOUNTER — Inpatient Hospital Stay

## 2024-05-26 ENCOUNTER — Encounter (HOSPITAL_COMMUNITY)
Admission: RE | Admit: 2024-05-26 | Discharge: 2024-05-26 | Disposition: A | Discharge status: Home / Self Care | Source: Ambulatory Visit | Attending: Hematology & Oncology | Admitting: Hematology & Oncology

## 2024-05-26 DIAGNOSIS — C3491 Malignant neoplasm of unspecified part of right bronchus or lung: Secondary | ICD-10-CM | POA: Diagnosis not present

## 2024-05-26 DIAGNOSIS — C7951 Secondary malignant neoplasm of bone: Secondary | ICD-10-CM | POA: Diagnosis not present

## 2024-05-26 LAB — GLUCOSE, CAPILLARY: Glucose-Capillary: 91 mg/dL (ref 70–99)

## 2024-05-26 MED ORDER — FLUDEOXYGLUCOSE F - 18 (FDG) INJECTION
5.4000 | Freq: Once | INTRAVENOUS | Status: AC
  Administered 2024-05-26: 5.42 via INTRAVENOUS

## 2024-05-27 DIAGNOSIS — D630 Anemia in neoplastic disease: Secondary | ICD-10-CM | POA: Diagnosis not present

## 2024-05-27 DIAGNOSIS — I700 Atherosclerosis of aorta: Secondary | ICD-10-CM | POA: Diagnosis not present

## 2024-05-27 DIAGNOSIS — Z79899 Other long term (current) drug therapy: Secondary | ICD-10-CM | POA: Diagnosis not present

## 2024-05-27 DIAGNOSIS — E871 Hypo-osmolality and hyponatremia: Secondary | ICD-10-CM | POA: Diagnosis not present

## 2024-05-27 DIAGNOSIS — I2584 Coronary atherosclerosis due to calcified coronary lesion: Secondary | ICD-10-CM | POA: Diagnosis not present

## 2024-05-27 DIAGNOSIS — Z87891 Personal history of nicotine dependence: Secondary | ICD-10-CM | POA: Diagnosis not present

## 2024-05-27 DIAGNOSIS — K589 Irritable bowel syndrome without diarrhea: Secondary | ICD-10-CM | POA: Diagnosis not present

## 2024-05-27 DIAGNOSIS — M8458XD Pathological fracture in neoplastic disease, other specified site, subsequent encounter for fracture with routine healing: Secondary | ICD-10-CM | POA: Diagnosis not present

## 2024-05-27 DIAGNOSIS — Z792 Long term (current) use of antibiotics: Secondary | ICD-10-CM | POA: Diagnosis not present

## 2024-05-27 DIAGNOSIS — Z7983 Long term (current) use of bisphosphonates: Secondary | ICD-10-CM | POA: Diagnosis not present

## 2024-05-27 DIAGNOSIS — E785 Hyperlipidemia, unspecified: Secondary | ICD-10-CM | POA: Diagnosis not present

## 2024-05-27 DIAGNOSIS — C786 Secondary malignant neoplasm of retroperitoneum and peritoneum: Secondary | ICD-10-CM | POA: Diagnosis not present

## 2024-05-27 DIAGNOSIS — I2510 Atherosclerotic heart disease of native coronary artery without angina pectoris: Secondary | ICD-10-CM | POA: Diagnosis not present

## 2024-05-27 DIAGNOSIS — K5900 Constipation, unspecified: Secondary | ICD-10-CM | POA: Diagnosis not present

## 2024-05-27 DIAGNOSIS — E876 Hypokalemia: Secondary | ICD-10-CM | POA: Diagnosis not present

## 2024-05-27 DIAGNOSIS — I82441 Acute embolism and thrombosis of right tibial vein: Secondary | ICD-10-CM | POA: Diagnosis not present

## 2024-05-27 DIAGNOSIS — C187 Malignant neoplasm of sigmoid colon: Secondary | ICD-10-CM | POA: Diagnosis not present

## 2024-05-27 DIAGNOSIS — Z7901 Long term (current) use of anticoagulants: Secondary | ICD-10-CM | POA: Diagnosis not present

## 2024-05-27 DIAGNOSIS — C3491 Malignant neoplasm of unspecified part of right bronchus or lung: Secondary | ICD-10-CM | POA: Diagnosis not present

## 2024-05-27 DIAGNOSIS — K219 Gastro-esophageal reflux disease without esophagitis: Secondary | ICD-10-CM | POA: Diagnosis not present

## 2024-05-27 DIAGNOSIS — J9811 Atelectasis: Secondary | ICD-10-CM | POA: Diagnosis not present

## 2024-05-27 DIAGNOSIS — N1330 Unspecified hydronephrosis: Secondary | ICD-10-CM | POA: Diagnosis not present

## 2024-05-27 DIAGNOSIS — Z7952 Long term (current) use of systemic steroids: Secondary | ICD-10-CM | POA: Diagnosis not present

## 2024-05-27 DIAGNOSIS — C7951 Secondary malignant neoplasm of bone: Secondary | ICD-10-CM | POA: Diagnosis not present

## 2024-05-27 DIAGNOSIS — Z79891 Long term (current) use of opiate analgesic: Secondary | ICD-10-CM | POA: Diagnosis not present

## 2024-05-29 ENCOUNTER — Encounter: Payer: Self-pay | Admitting: *Deleted

## 2024-05-29 NOTE — Progress Notes (Signed)
 Reviewed PET. Persistent disease is noted. New hypermetabolism seen in the chest, however infection is possible etiology. Patient has follow up schedule this week.   Oncology Nurse Navigator Documentation     05/29/2024    7:45 AM  Oncology Nurse Navigator Flowsheets  Navigator Follow Up Date: 05/31/2024  Navigator Follow Up Reason: Follow-up Appointment;Chemotherapy  Navigator Location [facility]  Navigator Encounter Type Scan Review  Patient Visit Type MedOnc  Treatment Phase Active Tx  Barriers/Navigation Needs Coordination of Care;Education  Interventions None Required  Acuity Level 2-Minimal Needs (1-2 Barriers Identified)  Support Groups/Services Friends and Family  Time Spent with Patient 15

## 2024-05-31 ENCOUNTER — Encounter: Payer: Self-pay | Admitting: *Deleted

## 2024-05-31 ENCOUNTER — Inpatient Hospital Stay

## 2024-05-31 ENCOUNTER — Inpatient Hospital Stay: Attending: Hematology & Oncology

## 2024-05-31 ENCOUNTER — Encounter: Payer: Self-pay | Admitting: Hematology & Oncology

## 2024-05-31 ENCOUNTER — Inpatient Hospital Stay (HOSPITAL_BASED_OUTPATIENT_CLINIC_OR_DEPARTMENT_OTHER): Admitting: Hematology & Oncology

## 2024-05-31 VITALS — BP 126/78 | HR 74 | Resp 19

## 2024-05-31 VITALS — BP 135/76 | HR 77 | Temp 98.0°F | Resp 18 | Ht 62.0 in | Wt 107.8 lb

## 2024-05-31 DIAGNOSIS — Z87891 Personal history of nicotine dependence: Secondary | ICD-10-CM | POA: Insufficient documentation

## 2024-05-31 DIAGNOSIS — Z5111 Encounter for antineoplastic chemotherapy: Secondary | ICD-10-CM | POA: Insufficient documentation

## 2024-05-31 DIAGNOSIS — Z79899 Other long term (current) drug therapy: Secondary | ICD-10-CM | POA: Diagnosis not present

## 2024-05-31 DIAGNOSIS — C7951 Secondary malignant neoplasm of bone: Secondary | ICD-10-CM | POA: Insufficient documentation

## 2024-05-31 DIAGNOSIS — C3491 Malignant neoplasm of unspecified part of right bronchus or lung: Secondary | ICD-10-CM

## 2024-05-31 DIAGNOSIS — Z5112 Encounter for antineoplastic immunotherapy: Secondary | ICD-10-CM | POA: Insufficient documentation

## 2024-05-31 LAB — CMP (CANCER CENTER ONLY)
ALT: 21 U/L (ref 0–44)
AST: 22 U/L (ref 15–41)
Albumin: 4.1 g/dL (ref 3.5–5.0)
Alkaline Phosphatase: 73 U/L (ref 38–126)
Anion gap: 13 (ref 5–15)
BUN: 19 mg/dL (ref 8–23)
CO2: 22 mmol/L (ref 22–32)
Calcium: 9.2 mg/dL (ref 8.9–10.3)
Chloride: 106 mmol/L (ref 98–111)
Creatinine: 0.65 mg/dL (ref 0.44–1.00)
GFR, Estimated: >60 mL/min (ref >60)
Glucose, Bld: 90 mg/dL (ref 70–99)
Potassium: 4.2 mmol/L (ref 3.5–5.1)
Sodium: 141 mmol/L (ref 135–145)
Total Bilirubin: 0.2 mg/dL (ref 0.0–1.2)
Total Protein: 6.6 g/dL (ref 6.5–8.1)

## 2024-05-31 LAB — CBC WITH DIFFERENTIAL (CANCER CENTER ONLY)
Abs Immature Granulocytes: 0.2 K/uL — ABNORMAL HIGH (ref 0.00–0.07)
Basophils Absolute: 0 K/uL (ref 0.0–0.1)
Basophils Relative: 0 %
Eosinophils Absolute: 0 K/uL (ref 0.0–0.5)
Eosinophils Relative: 0 %
HCT: 29.7 % — ABNORMAL LOW (ref 36.0–46.0)
Hemoglobin: 9.3 g/dL — ABNORMAL LOW (ref 12.0–15.0)
Immature Granulocytes: 3 %
Lymphocytes Relative: 10 %
Lymphs Abs: 0.8 K/uL (ref 0.7–4.0)
MCH: 29.5 pg (ref 26.0–34.0)
MCHC: 31.3 g/dL (ref 30.0–36.0)
MCV: 94.3 fL (ref 80.0–100.0)
Monocytes Absolute: 1 K/uL (ref 0.1–1.0)
Monocytes Relative: 13 %
Neutro Abs: 5.9 K/uL (ref 1.7–7.7)
Neutrophils Relative %: 74 %
Platelet Count: 301 K/uL (ref 150–400)
RBC: 3.15 MIL/uL — ABNORMAL LOW (ref 3.87–5.11)
RDW: 18.1 % — ABNORMAL HIGH (ref 11.5–15.5)
WBC Count: 8 K/uL (ref 4.0–10.5)
nRBC: 0 % (ref 0.0–0.2)

## 2024-05-31 LAB — IRON AND IRON BINDING CAPACITY (CC-WL,HP ONLY)
Iron: 79 ug/dL (ref 28–170)
Saturation Ratios: 27 % (ref 10.4–31.8)
TIBC: 290 ug/dL (ref 250–450)
UIBC: 211 ug/dL

## 2024-05-31 LAB — FERRITIN: Ferritin: 433 ng/mL — ABNORMAL HIGH (ref 11–307)

## 2024-05-31 MED ORDER — APREPITANT 130 MG/18ML IV EMUL
130.0000 mg | Freq: Once | INTRAVENOUS | Status: AC
  Administered 2024-05-31: 130 mg via INTRAVENOUS
  Filled 2024-05-31: qty 18

## 2024-05-31 MED ORDER — SODIUM CHLORIDE 0.9 % IV SOLN
200.0000 mg | Freq: Once | INTRAVENOUS | Status: AC
  Administered 2024-05-31: 200 mg via INTRAVENOUS
  Filled 2024-05-31: qty 8

## 2024-05-31 MED ORDER — SODIUM CHLORIDE 0.9 % IV SOLN: INTRAVENOUS | Status: DC

## 2024-05-31 MED ORDER — SODIUM CHLORIDE 0.9 % IV SOLN
500.0000 mg/m2 | Freq: Once | INTRAVENOUS | Status: AC
  Administered 2024-05-31: 700 mg via INTRAVENOUS
  Filled 2024-05-31: qty 20

## 2024-05-31 MED ORDER — PALONOSETRON HCL INJECTION 0.25 MG/5ML
0.2500 mg | Freq: Once | INTRAVENOUS | Status: AC
  Administered 2024-05-31: 0.25 mg via INTRAVENOUS
  Filled 2024-05-31: qty 5

## 2024-05-31 MED ORDER — SODIUM CHLORIDE 0.9 % IV SOLN
341.0000 mg | Freq: Once | INTRAVENOUS | Status: AC
  Administered 2024-05-31: 340 mg via INTRAVENOUS
  Filled 2024-05-31: qty 34

## 2024-05-31 MED ORDER — DEXAMETHASONE SODIUM PHOSPHATE 10 MG/ML IJ SOLN
10.0000 mg | Freq: Once | INTRAMUSCULAR | Status: AC
  Administered 2024-05-31: 10 mg via INTRAVENOUS
  Filled 2024-05-31: qty 1

## 2024-05-31 NOTE — Progress Notes (Signed)
 Patient comes in after recent PET. Dr Timmy believes PET shows improvement. She will continue on current treatment regimen. Will proceed with cycle four today.   Oncology Nurse Navigator Documentation     05/31/2024   11:45 AM  Oncology Nurse Navigator Flowsheets  Navigator Follow Up Date: 06/21/2024  Navigator Follow Up Reason: Follow-up Appointment;Chemotherapy  Navigator Location [facility]  Navigator Encounter Type Treatment  Patient Visit Type MedOnc  Treatment Phase Active Tx  Barriers/Navigation Needs Coordination of Care;Education  Interventions None Required  Acuity Level 2-Minimal Needs (1-2 Barriers Identified)  Support Groups/Services Friends and Family  Time Spent with Patient 15

## 2024-05-31 NOTE — Progress Notes (Signed)
 Hematology and Oncology Follow Up Visit  LASHARON DUNIVAN [id number] 1956-04-12 68 y.o. 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  She received 5000 rad to the lung and 3000 rad to the spine. Xgeva  120 mg subcu every 3 months-next dose 06/2024 Carbo/Alimta /Keytruda  -- s/p cycle #3 - start on 03/21/2024 Eliquis  5 mg p.o. twice daily --start 02/01/2024     Interim History:  Ms. Colasurdo is in for follow-up.  She actually looks quite good.  She is still having problems with her right leg.  She is still has radicular pain down the right leg.  We did do a PET scan on her.  The PET scan was done on 05/26/2024.  It showed that she had a lot of activity in the right upper lung where she has had radiation therapy in the past.  There is also Maness of activity in the sacrum.  This may be a little bit better.  She has some peri- sacral adenopathy which might be a little bit better.  Overall, I think that she is responding.  I know that with bony disease, it can take a while to see a good response.  She has had she is managing about as well as expected.  She is on pain medication which does seem to help.  She is more active.  She is able to walk better.  She is a little bit afraid to drive.  I think that she probably could drive.  She has had no fever.  She has had no bleeding.  She has had a decent appetite.  She is not using her incentive spirometer.  She really needs to use the incentive spirometer.  I told her that she has to use it 10 times an hour.  She has had no problems with bowels or bladder.  She has had no problems with rashes.  There is been no leg swelling..  She is on Eliquis .  She is doing well with  Eliquis .  Overall, I would have to say that her performance status is probably ECOG 1.  Medications:  Current Outpatient Medications:    apixaban  (ELIQUIS ) 5 MG TABS tablet, Take 1 tablet (5 mg total) by mouth 2 (two) times daily., Disp: 60 tablet, Rfl: 3   Calcium  Citrate 250 MG TABS, Take 250 mg by mouth in the morning., Disp: , Rfl:    Cholecalciferol  (VITAMIN D3) 50 MCG (2000 UT) TABS, Take 2,000 Units by mouth daily with breakfast., Disp: , Rfl:    cyanocobalamin  (VITAMIN B12) 1000 MCG tablet, Take 1,000 mcg by mouth daily., Disp: , Rfl:    dexamethasone  (DECADRON ) 4 MG tablet, Take 1 tab 2 times daily starting day before pemetrexed . Then take 2 tabs daily x 3 days starting day after carboplatin . Take with food., Disp: 30 tablet, Rfl: 1   fluticasone  (FLONASE ) 50 MCG/ACT nasal spray, Place 2 sprays into both nostrils daily., Disp: 16 g, Rfl: 0   folic acid  (FOLVITE ) 1 MG tablet, Take 1 tablet (1 mg total) by mouth daily. Start 7 days before pemetrexed  chemotherapy. Continue until 21 days after pemetrexed  completed., Disp: 100 tablet, Rfl: 3   gabapentin  (NEURONTIN ) 300 MG capsule, TAKE 1 CAPSULE(300 MG) BY MOUTH THREE TIMES DAILY, Disp: 90 capsule, Rfl: 1  ipratropium-albuterol  (DUONEB) 0.5-2.5 (3) MG/3ML SOLN, Take 3 mLs by nebulization 3 (three) times daily for 7 days, THEN 3 mLs every 6 (six) hours as needed., Disp: 360 mL, Rfl: 1   morphine  (MS CONTIN ) 15 MG 12 hr tablet, Take 1 tablet (15 mg total) by mouth every 12 (twelve) hours., Disp: 60 tablet, Rfl: 0   omeprazole  (PRILOSEC ) 20 MG capsule, Take 2 capsules (40 mg total) by mouth daily before breakfast., Disp: 60 capsule, Rfl: 1   ondansetron  (ZOFRAN ) 4 MG tablet, Take 1 tablet (4 mg total) by mouth every 6 (six) hours as needed for nausea., Disp: 20 tablet, Rfl: 0   ondansetron  (ZOFRAN ) 8 MG tablet, Take 1 tablet (8 mg total) by mouth every 8 (eight) hours as needed for nausea or vomiting. Start on the third day after carboplatin .,  Disp: 30 tablet, Rfl: 1   oxyCODONE  (OXY IR/ROXICODONE ) 5 MG immediate release tablet, Take one to two tablets every six hours as needed for pain, Disp: 90 tablet, Rfl: 0   prochlorperazine  (COMPAZINE ) 10 MG tablet, TAKE 1 TABLET(10 MG) BY MOUTH EVERY 6 HOURS AS NEEDED FOR NAUSEA OR VOMITING, Disp: 30 tablet, Rfl: 1   rosuvastatin  (CRESTOR ) 5 MG tablet, Take 5 mg by mouth in the morning., Disp: , Rfl:   Allergies: No Known Allergies  Past Medical History, Surgical history, Social history, and Family History were reviewed and updated.  Review of Systems: Review of Systems  Constitutional: Negative.   HENT:  Negative.    Eyes: Negative.   Respiratory: Negative.    Cardiovascular: Negative.   Gastrointestinal:  Positive for abdominal pain.  Endocrine: Negative.   Genitourinary: Negative.    Musculoskeletal:  Positive for back pain.  Neurological: Negative.   Hematological: Negative.   Psychiatric/Behavioral: Negative.      Physical Exam:  height is 5' 2 (1.575 m) and weight is 107 lb 12.8 oz (48.9 kg). Her oral temperature is 98 F (36.7 C). Her blood pressure is 135/76 and her pulse is 77. Her respiration is 18 and oxygen saturation is 99%.   Wt Readings from Last 3 Encounters:  05/31/24 107 lb 12.8 oz (48.9 kg)  05/03/24 108 lb (49 kg)  04/17/24 105 lb 12.8 oz (48 kg)    Physical Exam Vitals reviewed.  HENT:     Head: Normocephalic and atraumatic.  Eyes:     Pupils: Pupils are equal, round, and reactive to light.  Cardiovascular:     Rate and Rhythm: Normal rate and regular rhythm.     Heart sounds: Normal heart sounds.  Pulmonary:     Effort: Pulmonary effort is normal.     Breath sounds: Normal breath sounds.  Abdominal:     General: Bowel sounds are normal.     Palpations: Abdomen is soft.  Musculoskeletal:        General: No tenderness or deformity. Normal range of motion.     Cervical back: Normal range of motion.  Lymphadenopathy:     Cervical: No cervical  adenopathy.  Skin:    General: Skin is warm and dry.     Findings: No erythema or rash.  Neurological:     Mental Status: She is alert and oriented to person, place, and time.  Psychiatric:        Behavior: Behavior normal.        Thought Content: Thought content normal.        Judgment: Judgment normal.      Lab Results  Component Value  Date   WBC 8.0 05/31/2024   HGB 9.3 (L) 05/31/2024   HCT 29.7 (L) 05/31/2024   MCV 94.3 05/31/2024   PLT 301 05/31/2024     Chemistry      Component Value Date/Time   NA 140 05/03/2024 0948   K 4.5 05/03/2024 0948   CL 104 05/03/2024 0948   CO2 22 05/03/2024 0948   BUN 24 (H) 05/03/2024 0948   CREATININE 0.75 05/03/2024 0948      Component Value Date/Time   CALCIUM  9.2 05/03/2024 0948   ALKPHOS 70 05/03/2024 0948   AST 18 05/03/2024 0948   ALT 15 05/03/2024 0948   BILITOT 0.2 05/03/2024 0948      Impression and Plan: Ms. Ravan is a very charming 68 year old white female.  She has a past history of an early stage colon cancer.  However, now it looks like she has a metastatic non-small cell lung cancer-adenocarcinoma.  She completed the radiotherapy for the sacral metastasis and the lung primary.  Again, I think that the PET scan does look a bit better.  I think that the activity in the lung problems reflective of her radiation therapy and possible pneumonia.  I do think that using the incentive spirometer will make a difference.  We will go ahead with her fourth cycle of treatment.  We will probably do another PET scan after 6 cycles of treatment.  Hopefully, she can have decrease in this radicular pain in the right leg.  I told her that she may always have an element of pain just because she has the fracture.  We will plan to get her back in 3 weeks.   Maude JONELLE Crease, MD 9/24/202511:14 AM

## 2024-05-31 NOTE — Patient Instructions (Signed)
 [facility] - A DEPT OF [facility]  Discharge Instructions: Thank you for choosing [facility] to provide your oncology and hematology care.   If you have a lab appointment with the Cancer Center, please go directly to the Cancer Center and check in at the registration area.  Wear comfortable clothing and clothing appropriate for easy access to any Portacath or PICC line.   We strive to give you quality time with your provider. You may need to reschedule your appointment if you arrive late (15 or more minutes).  Arriving late affects you and other patients whose appointments are after yours.  Also, if you miss three or more appointments without notifying the office, you may be dismissed from the clinic at the provider's discretion.      For prescription refill requests, have your pharmacy contact our office and allow 72 hours for refills to be completed.    Today you received the following chemotherapy and/or immunotherapy agents Keytruda , Alimta , Carbo      To help prevent nausea and vomiting after your treatment, we encourage you to take your nausea medication as directed.  BELOW ARE SYMPTOMS THAT SHOULD BE REPORTED IMMEDIATELY: *FEVER GREATER THAN 100.4 F (38 C) OR HIGHER *CHILLS OR SWEATING *NAUSEA AND VOMITING THAT IS NOT CONTROLLED WITH YOUR NAUSEA MEDICATION *UNUSUAL SHORTNESS OF BREATH *UNUSUAL BRUISING OR BLEEDING *URINARY PROBLEMS (pain or burning when urinating, or frequent urination) *BOWEL PROBLEMS (unusual diarrhea, constipation, pain near the anus) TENDERNESS IN MOUTH AND THROAT WITH OR WITHOUT PRESENCE OF ULCERS (sore throat, sores in mouth, or a toothache) UNUSUAL RASH, SWELLING OR PAIN  UNUSUAL VAGINAL DISCHARGE OR ITCHING   Items with * indicate a potential emergency and should be followed up as soon as possible or go to the Emergency Department if any problems should occur.  Please show the CHEMOTHERAPY ALERT CARD or  IMMUNOTHERAPY ALERT CARD at check-in to the Emergency Department and triage nurse. Should you have questions after your visit or need to cancel or reschedule your appointment, please contact [facility] - A DEPT OF [facility]  [phone] and follow the prompts.  Office hours are 8:00 a.m. to 4:30 p.m. Monday - Friday. Please note that voicemails left after 4:00 p.m. may not be returned until the following business day.  We are closed weekends and major holidays. You have access to a nurse at all times for urgent questions. Please call the main number to the clinic [phone] and follow the prompts.  For any non-urgent questions, you may also contact your provider using MyChart. We now offer e-Visits for anyone 37 and older to request care online for non-urgent symptoms. For details visit [email or web address].   Also download the MyChart app! Go to the app store, search MyChart, open the app, select [place], and log in with your MyChart username and password.

## 2024-06-02 ENCOUNTER — Encounter: Payer: Self-pay | Admitting: Hematology & Oncology

## 2024-06-12 DIAGNOSIS — K589 Irritable bowel syndrome without diarrhea: Secondary | ICD-10-CM | POA: Diagnosis not present

## 2024-06-12 DIAGNOSIS — K5900 Constipation, unspecified: Secondary | ICD-10-CM | POA: Diagnosis not present

## 2024-06-12 DIAGNOSIS — J9811 Atelectasis: Secondary | ICD-10-CM | POA: Diagnosis not present

## 2024-06-12 DIAGNOSIS — M8458XD Pathological fracture in neoplastic disease, other specified site, subsequent encounter for fracture with routine healing: Secondary | ICD-10-CM | POA: Diagnosis not present

## 2024-06-12 DIAGNOSIS — I2584 Coronary atherosclerosis due to calcified coronary lesion: Secondary | ICD-10-CM | POA: Diagnosis not present

## 2024-06-12 DIAGNOSIS — N1330 Unspecified hydronephrosis: Secondary | ICD-10-CM | POA: Diagnosis not present

## 2024-06-12 DIAGNOSIS — Z7952 Long term (current) use of systemic steroids: Secondary | ICD-10-CM | POA: Diagnosis not present

## 2024-06-12 DIAGNOSIS — I2510 Atherosclerotic heart disease of native coronary artery without angina pectoris: Secondary | ICD-10-CM | POA: Diagnosis not present

## 2024-06-12 DIAGNOSIS — C3491 Malignant neoplasm of unspecified part of right bronchus or lung: Secondary | ICD-10-CM | POA: Diagnosis not present

## 2024-06-12 DIAGNOSIS — Z79899 Other long term (current) drug therapy: Secondary | ICD-10-CM | POA: Diagnosis not present

## 2024-06-12 DIAGNOSIS — Z7901 Long term (current) use of anticoagulants: Secondary | ICD-10-CM | POA: Diagnosis not present

## 2024-06-12 DIAGNOSIS — Z87891 Personal history of nicotine dependence: Secondary | ICD-10-CM | POA: Diagnosis not present

## 2024-06-12 DIAGNOSIS — D630 Anemia in neoplastic disease: Secondary | ICD-10-CM | POA: Diagnosis not present

## 2024-06-12 DIAGNOSIS — C187 Malignant neoplasm of sigmoid colon: Secondary | ICD-10-CM | POA: Diagnosis not present

## 2024-06-12 DIAGNOSIS — I700 Atherosclerosis of aorta: Secondary | ICD-10-CM | POA: Diagnosis not present

## 2024-06-12 DIAGNOSIS — E785 Hyperlipidemia, unspecified: Secondary | ICD-10-CM | POA: Diagnosis not present

## 2024-06-12 DIAGNOSIS — Z79891 Long term (current) use of opiate analgesic: Secondary | ICD-10-CM | POA: Diagnosis not present

## 2024-06-12 DIAGNOSIS — K219 Gastro-esophageal reflux disease without esophagitis: Secondary | ICD-10-CM | POA: Diagnosis not present

## 2024-06-12 DIAGNOSIS — E871 Hypo-osmolality and hyponatremia: Secondary | ICD-10-CM | POA: Diagnosis not present

## 2024-06-12 DIAGNOSIS — C786 Secondary malignant neoplasm of retroperitoneum and peritoneum: Secondary | ICD-10-CM | POA: Diagnosis not present

## 2024-06-12 DIAGNOSIS — E876 Hypokalemia: Secondary | ICD-10-CM | POA: Diagnosis not present

## 2024-06-12 DIAGNOSIS — Z792 Long term (current) use of antibiotics: Secondary | ICD-10-CM | POA: Diagnosis not present

## 2024-06-12 DIAGNOSIS — Z7983 Long term (current) use of bisphosphonates: Secondary | ICD-10-CM | POA: Diagnosis not present

## 2024-06-12 DIAGNOSIS — I82441 Acute embolism and thrombosis of right tibial vein: Secondary | ICD-10-CM | POA: Diagnosis not present

## 2024-06-12 DIAGNOSIS — C7951 Secondary malignant neoplasm of bone: Secondary | ICD-10-CM | POA: Diagnosis not present

## 2024-06-14 ENCOUNTER — Inpatient Hospital Stay

## 2024-06-14 ENCOUNTER — Other Ambulatory Visit: Payer: Self-pay

## 2024-06-14 ENCOUNTER — Inpatient Hospital Stay: Admitting: Hematology & Oncology

## 2024-06-14 ENCOUNTER — Telehealth: Payer: Self-pay

## 2024-06-14 DIAGNOSIS — C3491 Malignant neoplasm of unspecified part of right bronchus or lung: Secondary | ICD-10-CM

## 2024-06-14 DIAGNOSIS — C187 Malignant neoplasm of sigmoid colon: Secondary | ICD-10-CM

## 2024-06-14 DIAGNOSIS — C786 Secondary malignant neoplasm of retroperitoneum and peritoneum: Secondary | ICD-10-CM

## 2024-06-14 DIAGNOSIS — C3490 Malignant neoplasm of unspecified part of unspecified bronchus or lung: Secondary | ICD-10-CM

## 2024-06-14 MED ORDER — OXYCODONE HCL 5 MG PO TABS: ORAL_TABLET | ORAL | 0 refills | Status: DC

## 2024-06-14 MED ORDER — MORPHINE SULFATE ER 15 MG PO TBCR: 15.0000 mg | EXTENDED_RELEASE_TABLET | Freq: Two times a day (BID) | ORAL | 0 refills | Status: DC

## 2024-06-14 NOTE — Telephone Encounter (Signed)
 Pt called in requesting a refill on Oxycodone  5 mg and morphine  15 mg to be sent to [facility] on [address]. Please advise, thanks.

## 2024-06-14 NOTE — Telephone Encounter (Signed)
 This encounter was created in error - please disregard.

## 2024-06-21 ENCOUNTER — Encounter: Payer: Self-pay | Admitting: *Deleted

## 2024-06-21 ENCOUNTER — Other Ambulatory Visit: Payer: Self-pay | Admitting: Hematology & Oncology

## 2024-06-21 ENCOUNTER — Inpatient Hospital Stay: Attending: Hematology & Oncology

## 2024-06-21 ENCOUNTER — Inpatient Hospital Stay: Admitting: Hematology & Oncology

## 2024-06-21 ENCOUNTER — Inpatient Hospital Stay

## 2024-06-21 VITALS — BP 133/75 | HR 79 | Temp 98.9°F | Resp 18 | Wt 109.0 lb

## 2024-06-21 DIAGNOSIS — C7951 Secondary malignant neoplasm of bone: Secondary | ICD-10-CM | POA: Diagnosis not present

## 2024-06-21 DIAGNOSIS — C3491 Malignant neoplasm of unspecified part of right bronchus or lung: Secondary | ICD-10-CM | POA: Insufficient documentation

## 2024-06-21 DIAGNOSIS — Z5112 Encounter for antineoplastic immunotherapy: Secondary | ICD-10-CM | POA: Insufficient documentation

## 2024-06-21 DIAGNOSIS — Z79620 Long term (current) use of immunosuppressive biologic: Secondary | ICD-10-CM | POA: Diagnosis not present

## 2024-06-21 DIAGNOSIS — Z5111 Encounter for antineoplastic chemotherapy: Secondary | ICD-10-CM | POA: Insufficient documentation

## 2024-06-21 DIAGNOSIS — Z87891 Personal history of nicotine dependence: Secondary | ICD-10-CM | POA: Insufficient documentation

## 2024-06-21 DIAGNOSIS — C786 Secondary malignant neoplasm of retroperitoneum and peritoneum: Secondary | ICD-10-CM

## 2024-06-21 LAB — CBC WITH DIFFERENTIAL (CANCER CENTER ONLY)
Abs Immature Granulocytes: 0.05 K/uL (ref 0.00–0.07)
Basophils Absolute: 0 K/uL (ref 0.0–0.1)
Basophils Relative: 0 %
Eosinophils Absolute: 0 K/uL (ref 0.0–0.5)
Eosinophils Relative: 0 %
HCT: 28.4 % — ABNORMAL LOW (ref 36.0–46.0)
Hemoglobin: 9.1 g/dL — ABNORMAL LOW (ref 12.0–15.0)
Immature Granulocytes: 1 %
Lymphocytes Relative: 13 %
Lymphs Abs: 0.5 K/uL — ABNORMAL LOW (ref 0.7–4.0)
MCH: 30.3 pg (ref 26.0–34.0)
MCHC: 32 g/dL (ref 30.0–36.0)
MCV: 94.7 fL (ref 80.0–100.0)
Monocytes Absolute: 0.7 K/uL (ref 0.1–1.0)
Monocytes Relative: 17 %
Neutro Abs: 2.9 K/uL (ref 1.7–7.7)
Neutrophils Relative %: 69 %
Platelet Count: 245 K/uL (ref 150–400)
RBC: 3 MIL/uL — ABNORMAL LOW (ref 3.87–5.11)
RDW: 17.2 % — ABNORMAL HIGH (ref 11.5–15.5)
WBC Count: 4.1 K/uL (ref 4.0–10.5)
nRBC: 0 % (ref 0.0–0.2)

## 2024-06-21 LAB — CMP (CANCER CENTER ONLY)
ALT: 30 U/L (ref 0–44)
AST: 28 U/L (ref 15–41)
Albumin: 4.4 g/dL (ref 3.5–5.0)
Alkaline Phosphatase: 71 U/L (ref 38–126)
Anion gap: 13 (ref 5–15)
BUN: 21 mg/dL (ref 8–23)
CO2: 22 mmol/L (ref 22–32)
Calcium: 9.2 mg/dL (ref 8.9–10.3)
Chloride: 105 mmol/L (ref 98–111)
Creatinine: 0.72 mg/dL (ref 0.44–1.00)
GFR, Estimated: >60 mL/min (ref >60)
Glucose, Bld: 116 mg/dL — ABNORMAL HIGH (ref 70–99)
Potassium: 4.2 mmol/L (ref 3.5–5.1)
Sodium: 140 mmol/L (ref 135–145)
Total Bilirubin: 0.2 mg/dL (ref 0.0–1.2)
Total Protein: 6.5 g/dL (ref 6.5–8.1)

## 2024-06-21 LAB — RETICULOCYTES
Immature Retic Fract: 29.4 % — ABNORMAL HIGH (ref 2.3–15.9)
RBC.: 2.98 MIL/uL — ABNORMAL LOW (ref 3.87–5.11)
Retic Count, Absolute: 117.7 K/uL (ref 19.0–186.0)
Retic Ct Pct: 4 % — ABNORMAL HIGH (ref 0.4–3.1)

## 2024-06-21 LAB — FERRITIN: Ferritin: 421 ng/mL — ABNORMAL HIGH (ref 11–307)

## 2024-06-21 LAB — IRON AND IRON BINDING CAPACITY (CC-WL,HP ONLY)
Iron: 70 ug/dL (ref 28–170)
Saturation Ratios: 25 % (ref 10.4–31.8)
TIBC: 279 ug/dL (ref 250–450)
UIBC: 209 ug/dL

## 2024-06-21 LAB — TSH: TSH: 0.278 u[IU]/mL — ABNORMAL LOW (ref 0.350–4.500)

## 2024-06-21 MED ORDER — SODIUM CHLORIDE 0.9 % IV SOLN
341.0000 mg | Freq: Once | INTRAVENOUS | Status: AC
  Administered 2024-06-21: 340 mg via INTRAVENOUS
  Filled 2024-06-21: qty 34

## 2024-06-21 MED ORDER — DEXAMETHASONE SOD PHOSPHATE PF 10 MG/ML IJ SOLN
10.0000 mg | Freq: Once | INTRAMUSCULAR | Status: AC
  Administered 2024-06-21: 10 mg via INTRAVENOUS

## 2024-06-21 MED ORDER — PROCHLORPERAZINE MALEATE 10 MG PO TABS
10.0000 mg | ORAL_TABLET | Freq: Once | ORAL | Status: DC
  Filled 2024-06-21: qty 1

## 2024-06-21 MED ORDER — APREPITANT 130 MG/18ML IV EMUL
130.0000 mg | Freq: Once | INTRAVENOUS | Status: AC
  Administered 2024-06-21: 130 mg via INTRAVENOUS
  Filled 2024-06-21: qty 18

## 2024-06-21 MED ORDER — SODIUM CHLORIDE 0.9 % IV SOLN: INTRAVENOUS | Status: DC

## 2024-06-21 MED ORDER — SODIUM CHLORIDE 0.9 % IV SOLN
200.0000 mg | Freq: Once | INTRAVENOUS | Status: AC
  Administered 2024-06-21: 200 mg via INTRAVENOUS
  Filled 2024-06-21: qty 8

## 2024-06-21 MED ORDER — PALONOSETRON HCL INJECTION 0.25 MG/5ML
0.2500 mg | Freq: Once | INTRAVENOUS | Status: AC
  Administered 2024-06-21: 0.25 mg via INTRAVENOUS
  Filled 2024-06-21: qty 5

## 2024-06-21 MED ORDER — DENOSUMAB 120 MG/1.7ML ~~LOC~~ SOLN
120.0000 mg | Freq: Once | SUBCUTANEOUS | Status: AC
  Administered 2024-06-21: 120 mg via SUBCUTANEOUS
  Filled 2024-06-21: qty 1.7

## 2024-06-21 MED ORDER — SODIUM CHLORIDE 0.9 % IV SOLN
500.0000 mg/m2 | Freq: Once | INTRAVENOUS | Status: AC
  Administered 2024-06-21: 700 mg via INTRAVENOUS
  Filled 2024-06-21: qty 20

## 2024-06-21 NOTE — Patient Instructions (Addendum)
 [facility] - A DEPT OF [facility]  Discharge Instructions: Thank you for choosing [facility] to provide your oncology and hematology care.   If you have a lab appointment with the Cancer Center, please go directly to the Cancer Center and check in at the registration area.  Wear comfortable clothing and clothing appropriate for easy access to any Portacath or PICC line.   We strive to give you quality time with your provider. You may need to reschedule your appointment if you arrive late (15 or more minutes).  Arriving late affects you and other patients whose appointments are after yours.  Also, if you miss three or more appointments without notifying the office, you may be dismissed from the clinic at the provider's discretion.      For prescription refill requests, have your pharmacy contact our office and allow 72 hours for refills to be completed.    Today you received the following chemotherapy and/or immunotherapy agents carboplatin , Alimta , Keytruda , xgeva      To help prevent nausea and vomiting after your treatment, we encourage you to take your nausea medication as directed.  BELOW ARE SYMPTOMS THAT SHOULD BE REPORTED IMMEDIATELY: *FEVER GREATER THAN 100.4 F (38 C) OR HIGHER *CHILLS OR SWEATING *NAUSEA AND VOMITING THAT IS NOT CONTROLLED WITH YOUR NAUSEA MEDICATION *UNUSUAL SHORTNESS OF BREATH *UNUSUAL BRUISING OR BLEEDING *URINARY PROBLEMS (pain or burning when urinating, or frequent urination) *BOWEL PROBLEMS (unusual diarrhea, constipation, pain near the anus) TENDERNESS IN MOUTH AND THROAT WITH OR WITHOUT PRESENCE OF ULCERS (sore throat, sores in mouth, or a toothache) UNUSUAL RASH, SWELLING OR PAIN  UNUSUAL VAGINAL DISCHARGE OR ITCHING   Items with * indicate a potential emergency and should be followed up as soon as possible or go to the Emergency Department if any problems should occur.  Please show the CHEMOTHERAPY  ALERT CARD or IMMUNOTHERAPY ALERT CARD at check-in to the Emergency Department and triage nurse. Should you have questions after your visit or need to cancel or reschedule your appointment, please contact [facility] - A DEPT OF [facility]  [phone] and follow the prompts.  Office hours are 8:00 a.m. to 4:30 p.m. Monday - Friday. Please note that voicemails left after 4:00 p.m. may not be returned until the following business day.  We are closed weekends and major holidays. You have access to a nurse at all times for urgent questions. Please call the main number to the clinic [phone] and follow the prompts.  For any non-urgent questions, you may also contact your provider using MyChart. We now offer e-Visits for anyone 42 and older to request care online for non-urgent symptoms. For details visit [email or web address].   Also download the MyChart app! Go to the app store, search MyChart, open the app, select [place], and log in with your MyChart username and password.

## 2024-06-21 NOTE — Progress Notes (Signed)
 Patient is doing well with treatment. She will proceed with cycle five today.   Oncology Nurse Navigator Documentation     06/21/2024   11:30 AM  Oncology Nurse Navigator Flowsheets  Navigator Follow Up Date: 07/13/2024  Navigator Follow Up Reason: Follow-up Appointment;Chemotherapy  Navigator Location [facility]  Navigator Encounter Type Treatment  Patient Visit Type MedOnc  Treatment Phase Active Tx  Barriers/Navigation Needs No Barriers At This Time  Interventions Psycho-Social Support  Acuity Level 1-No Barriers  Support Groups/Services Friends and Family  Time Spent with Patient 15

## 2024-06-21 NOTE — Progress Notes (Signed)
 Hematology and Oncology Follow Up Visit  Regina Young [id number] 1956/05/20 68 y.o. 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  She received 5000 rad to the lung and 3000 rad to the spine. Xgeva  120 mg subcu every 3 months-next dose 09/2024  Carbo/Alimta /Keytruda  -- s/p cycle #4 - start on 03/21/2024 Eliquis  5 mg p.o. twice daily --start 02/01/2024     Interim History:  Regina Young is in for follow-up.  She actually looks quite good.  She is still having problems with her right leg.  She is still having radicular pain down the right leg.  However, this seems to be happening only when she bends over.  She is eating better.  She has had no problems with nausea or vomiting.  Has been no change in bowel or bladder habits.  She has had no cough.  She has had no shortness of breath.  There is been no chest wall pain.  She continues on Eliquis .  She is doing well on the Eliquis .  Again, there is no problems with bleeding.  Currently, I would have to say that her performance status is probably ECOG 1.  Medications:  Current Outpatient Medications:    Calcium  Citrate 250 MG TABS, Take 250 mg by mouth in the morning., Disp: , Rfl:    Cholecalciferol  (VITAMIN D3) 50 MCG (2000 UT) TABS, Take 2,000 Units by mouth daily with breakfast., Disp: , Rfl:    cyanocobalamin  (VITAMIN B12) 1000 MCG tablet, Take 1,000 mcg by mouth daily., Disp: , Rfl:    dexamethasone  (DECADRON ) 4 MG tablet, Take 1 tab 2 times daily starting day before pemetrexed . Then take 2 tabs daily x 3 days starting day after carboplatin . Take with food., Disp: 30 tablet, Rfl: 1   ELIQUIS  5 MG TABS tablet, TAKE 1 TABLET(5 MG) BY MOUTH TWICE DAILY, Disp:  60 tablet, Rfl: 3   fluticasone  (FLONASE ) 50 MCG/ACT nasal spray, Place 2 sprays into both nostrils daily., Disp: 16 g, Rfl: 0   folic acid  (FOLVITE ) 1 MG tablet, Take 1 tablet (1 mg total) by mouth daily. Start 7 days before pemetrexed  chemotherapy. Continue until 21 days after pemetrexed  completed., Disp: 100 tablet, Rfl: 3   gabapentin  (NEURONTIN ) 300 MG capsule, TAKE 1 CAPSULE(300 MG) BY MOUTH THREE TIMES DAILY, Disp: 90 capsule, Rfl: 1   ipratropium-albuterol  (DUONEB) 0.5-2.5 (3) MG/3ML SOLN, Take 3 mLs by nebulization 3 (three) times daily for 7 days, THEN 3 mLs every 6 (six) hours as needed., Disp: 360 mL, Rfl: 1   morphine  (MS CONTIN ) 15 MG 12 hr tablet, Take 1 tablet (15 mg total) by mouth every 12 (twelve) hours., Disp: 60 tablet, Rfl: 0   omeprazole  (PRILOSEC ) 20 MG capsule, Take 2 capsules (40 mg total) by mouth daily before breakfast., Disp: 60 capsule, Rfl: 1   ondansetron  (ZOFRAN ) 4 MG tablet, Take 1 tablet (4 mg total) by mouth every 6 (six) hours as needed for nausea., Disp: 20 tablet, Rfl: 0   ondansetron  (ZOFRAN ) 8 MG tablet, Take 1 tablet (8 mg total) by mouth every 8 (eight) hours as needed for nausea or vomiting. Start on the third day after carboplatin ., Disp: 30 tablet, Rfl: 1   oxyCODONE  (OXY IR/ROXICODONE ) 5 MG immediate release tablet, Take one  to two tablets every six hours as needed for pain, Disp: 90 tablet, Rfl: 0   prochlorperazine  (COMPAZINE ) 10 MG tablet, TAKE 1 TABLET(10 MG) BY MOUTH EVERY 6 HOURS AS NEEDED FOR NAUSEA OR VOMITING, Disp: 30 tablet, Rfl: 1   rosuvastatin  (CRESTOR ) 5 MG tablet, Take 5 mg by mouth in the morning., Disp: , Rfl:   Allergies: No Known Allergies  Past Medical History, Surgical history, Social history, and Family History were reviewed and updated.  Review of Systems: Review of Systems  Constitutional: Negative.   HENT:  Negative.    Eyes: Negative.   Respiratory: Negative.    Cardiovascular: Negative.   Gastrointestinal:  Positive  for abdominal pain.  Endocrine: Negative.   Genitourinary: Negative.    Musculoskeletal:  Positive for back pain.  Neurological: Negative.   Hematological: Negative.   Psychiatric/Behavioral: Negative.      Physical Exam:  weight is 109 lb (49.4 kg). Her oral temperature is 98.9 F (37.2 C). Her blood pressure is 133/75 and her pulse is 79. Her respiration is 18 and oxygen saturation is 98%.   Wt Readings from Last 3 Encounters:  06/21/24 109 lb (49.4 kg)  05/31/24 107 lb 12.8 oz (48.9 kg)  05/03/24 108 lb (49 kg)    Physical Exam Vitals reviewed.  HENT:     Head: Normocephalic and atraumatic.  Eyes:     Pupils: Pupils are equal, round, and reactive to light.  Cardiovascular:     Rate and Rhythm: Normal rate and regular rhythm.     Heart sounds: Normal heart sounds.  Pulmonary:     Effort: Pulmonary effort is normal.     Breath sounds: Normal breath sounds.  Abdominal:     General: Bowel sounds are normal.     Palpations: Abdomen is soft.  Musculoskeletal:        General: No tenderness or deformity. Normal range of motion.     Cervical back: Normal range of motion.  Lymphadenopathy:     Cervical: No cervical adenopathy.  Skin:    General: Skin is warm and dry.     Findings: No erythema or rash.  Neurological:     Mental Status: She is alert and oriented to person, place, and time.  Psychiatric:        Behavior: Behavior normal.        Thought Content: Thought content normal.        Judgment: Judgment normal.      Lab Results  Component Value Date   WBC 4.1 06/21/2024   HGB 9.1 (L) 06/21/2024   HCT 28.4 (L) 06/21/2024   MCV 94.7 06/21/2024   PLT 245 06/21/2024     Chemistry      Component Value Date/Time   NA 140 06/21/2024 1000   K 4.2 06/21/2024 1000   CL 105 06/21/2024 1000   CO2 22 06/21/2024 1000   BUN 21 06/21/2024 1000   CREATININE 0.72 06/21/2024 1000      Component Value Date/Time   CALCIUM  9.2 06/21/2024 1000   ALKPHOS 71 06/21/2024  1000   AST 28 06/21/2024 1000   ALT 30 06/21/2024 1000   BILITOT 0.2 06/21/2024 1000      Impression and Plan: Regina Young is a very charming 68 year old white female.  She has a past history of an early stage colon cancer.  However, now it looks like she has a metastatic non-small cell lung cancer-adenocarcinoma.  She completed the radiotherapy for the sacral metastasis and the lung  primary.  For right now, we will continue her on treatment.  This will be her fifth cycle of treatment.  I need to make sure that she has the carboplatinum.  I think this is important for the protocol.  She will get her Xgeva  today.  After the sixth cycle of treatment, we will go ahead and plan for another PET scan.  I am just happy that she seems to be doing a little bit better.  Hopefully, this is a sign that she is finally responding. SABRA   Maude JONELLE Crease, MD 10/15/202511:38 AM

## 2024-06-21 NOTE — Progress Notes (Signed)
 Planning for another cycle of carboplatin  per Dr. Timmy. Premeds entered.  Norleen JAYSON Sou, [facility] 06/21/24 12:30 PM

## 2024-06-21 NOTE — Patient Instructions (Signed)

## 2024-06-22 ENCOUNTER — Encounter: Payer: Self-pay | Admitting: Hematology & Oncology

## 2024-06-23 ENCOUNTER — Other Ambulatory Visit: Payer: Self-pay

## 2024-06-24 ENCOUNTER — Other Ambulatory Visit: Payer: Self-pay | Admitting: Hematology & Oncology

## 2024-06-30 DIAGNOSIS — N1330 Unspecified hydronephrosis: Secondary | ICD-10-CM | POA: Diagnosis not present

## 2024-06-30 DIAGNOSIS — D630 Anemia in neoplastic disease: Secondary | ICD-10-CM | POA: Diagnosis not present

## 2024-06-30 DIAGNOSIS — K589 Irritable bowel syndrome without diarrhea: Secondary | ICD-10-CM | POA: Diagnosis not present

## 2024-06-30 DIAGNOSIS — C3491 Malignant neoplasm of unspecified part of right bronchus or lung: Secondary | ICD-10-CM | POA: Diagnosis not present

## 2024-06-30 DIAGNOSIS — Z79891 Long term (current) use of opiate analgesic: Secondary | ICD-10-CM | POA: Diagnosis not present

## 2024-06-30 DIAGNOSIS — Z7983 Long term (current) use of bisphosphonates: Secondary | ICD-10-CM | POA: Diagnosis not present

## 2024-06-30 DIAGNOSIS — C187 Malignant neoplasm of sigmoid colon: Secondary | ICD-10-CM | POA: Diagnosis not present

## 2024-06-30 DIAGNOSIS — E785 Hyperlipidemia, unspecified: Secondary | ICD-10-CM | POA: Diagnosis not present

## 2024-06-30 DIAGNOSIS — Z7952 Long term (current) use of systemic steroids: Secondary | ICD-10-CM | POA: Diagnosis not present

## 2024-06-30 DIAGNOSIS — I700 Atherosclerosis of aorta: Secondary | ICD-10-CM | POA: Diagnosis not present

## 2024-06-30 DIAGNOSIS — K5900 Constipation, unspecified: Secondary | ICD-10-CM | POA: Diagnosis not present

## 2024-06-30 DIAGNOSIS — Z87891 Personal history of nicotine dependence: Secondary | ICD-10-CM | POA: Diagnosis not present

## 2024-06-30 DIAGNOSIS — C7951 Secondary malignant neoplasm of bone: Secondary | ICD-10-CM | POA: Diagnosis not present

## 2024-06-30 DIAGNOSIS — Z79899 Other long term (current) drug therapy: Secondary | ICD-10-CM | POA: Diagnosis not present

## 2024-06-30 DIAGNOSIS — Z792 Long term (current) use of antibiotics: Secondary | ICD-10-CM | POA: Diagnosis not present

## 2024-06-30 DIAGNOSIS — I82441 Acute embolism and thrombosis of right tibial vein: Secondary | ICD-10-CM | POA: Diagnosis not present

## 2024-06-30 DIAGNOSIS — J9811 Atelectasis: Secondary | ICD-10-CM | POA: Diagnosis not present

## 2024-06-30 DIAGNOSIS — C786 Secondary malignant neoplasm of retroperitoneum and peritoneum: Secondary | ICD-10-CM | POA: Diagnosis not present

## 2024-06-30 DIAGNOSIS — E876 Hypokalemia: Secondary | ICD-10-CM | POA: Diagnosis not present

## 2024-06-30 DIAGNOSIS — M8458XD Pathological fracture in neoplastic disease, other specified site, subsequent encounter for fracture with routine healing: Secondary | ICD-10-CM | POA: Diagnosis not present

## 2024-06-30 DIAGNOSIS — I2584 Coronary atherosclerosis due to calcified coronary lesion: Secondary | ICD-10-CM | POA: Diagnosis not present

## 2024-06-30 DIAGNOSIS — K219 Gastro-esophageal reflux disease without esophagitis: Secondary | ICD-10-CM | POA: Diagnosis not present

## 2024-06-30 DIAGNOSIS — Z7901 Long term (current) use of anticoagulants: Secondary | ICD-10-CM | POA: Diagnosis not present

## 2024-06-30 DIAGNOSIS — E871 Hypo-osmolality and hyponatremia: Secondary | ICD-10-CM | POA: Diagnosis not present

## 2024-06-30 DIAGNOSIS — I2510 Atherosclerotic heart disease of native coronary artery without angina pectoris: Secondary | ICD-10-CM | POA: Diagnosis not present

## 2024-07-03 ENCOUNTER — Other Ambulatory Visit: Payer: Self-pay | Admitting: *Deleted

## 2024-07-03 DIAGNOSIS — C187 Malignant neoplasm of sigmoid colon: Secondary | ICD-10-CM

## 2024-07-03 DIAGNOSIS — C786 Secondary malignant neoplasm of retroperitoneum and peritoneum: Secondary | ICD-10-CM

## 2024-07-03 DIAGNOSIS — C3491 Malignant neoplasm of unspecified part of right bronchus or lung: Secondary | ICD-10-CM

## 2024-07-03 DIAGNOSIS — C3490 Malignant neoplasm of unspecified part of unspecified bronchus or lung: Secondary | ICD-10-CM

## 2024-07-03 MED ORDER — OXYCODONE HCL 5 MG PO TABS: ORAL_TABLET | ORAL | 0 refills | Status: DC

## 2024-07-10 ENCOUNTER — Encounter: Payer: Self-pay | Admitting: Radiology

## 2024-07-12 ENCOUNTER — Other Ambulatory Visit: Payer: Self-pay

## 2024-07-12 DIAGNOSIS — C786 Secondary malignant neoplasm of retroperitoneum and peritoneum: Secondary | ICD-10-CM | POA: Diagnosis not present

## 2024-07-12 DIAGNOSIS — Z7952 Long term (current) use of systemic steroids: Secondary | ICD-10-CM | POA: Diagnosis not present

## 2024-07-12 DIAGNOSIS — K219 Gastro-esophageal reflux disease without esophagitis: Secondary | ICD-10-CM | POA: Diagnosis not present

## 2024-07-12 DIAGNOSIS — M8458XD Pathological fracture in neoplastic disease, other specified site, subsequent encounter for fracture with routine healing: Secondary | ICD-10-CM | POA: Diagnosis not present

## 2024-07-12 DIAGNOSIS — E785 Hyperlipidemia, unspecified: Secondary | ICD-10-CM | POA: Diagnosis not present

## 2024-07-12 DIAGNOSIS — K589 Irritable bowel syndrome without diarrhea: Secondary | ICD-10-CM | POA: Diagnosis not present

## 2024-07-12 DIAGNOSIS — C7951 Secondary malignant neoplasm of bone: Secondary | ICD-10-CM | POA: Diagnosis not present

## 2024-07-12 DIAGNOSIS — N1330 Unspecified hydronephrosis: Secondary | ICD-10-CM | POA: Diagnosis not present

## 2024-07-12 DIAGNOSIS — I2584 Coronary atherosclerosis due to calcified coronary lesion: Secondary | ICD-10-CM | POA: Diagnosis not present

## 2024-07-12 DIAGNOSIS — Z792 Long term (current) use of antibiotics: Secondary | ICD-10-CM | POA: Diagnosis not present

## 2024-07-12 DIAGNOSIS — C3491 Malignant neoplasm of unspecified part of right bronchus or lung: Secondary | ICD-10-CM | POA: Diagnosis not present

## 2024-07-12 DIAGNOSIS — I82441 Acute embolism and thrombosis of right tibial vein: Secondary | ICD-10-CM | POA: Diagnosis not present

## 2024-07-12 DIAGNOSIS — Z7901 Long term (current) use of anticoagulants: Secondary | ICD-10-CM | POA: Diagnosis not present

## 2024-07-12 DIAGNOSIS — K5900 Constipation, unspecified: Secondary | ICD-10-CM | POA: Diagnosis not present

## 2024-07-12 DIAGNOSIS — J9811 Atelectasis: Secondary | ICD-10-CM | POA: Diagnosis not present

## 2024-07-12 DIAGNOSIS — I2510 Atherosclerotic heart disease of native coronary artery without angina pectoris: Secondary | ICD-10-CM | POA: Diagnosis not present

## 2024-07-12 DIAGNOSIS — C187 Malignant neoplasm of sigmoid colon: Secondary | ICD-10-CM | POA: Diagnosis not present

## 2024-07-12 DIAGNOSIS — D630 Anemia in neoplastic disease: Secondary | ICD-10-CM | POA: Diagnosis not present

## 2024-07-12 DIAGNOSIS — Z79899 Other long term (current) drug therapy: Secondary | ICD-10-CM | POA: Diagnosis not present

## 2024-07-12 DIAGNOSIS — I700 Atherosclerosis of aorta: Secondary | ICD-10-CM | POA: Diagnosis not present

## 2024-07-12 DIAGNOSIS — Z87891 Personal history of nicotine dependence: Secondary | ICD-10-CM | POA: Diagnosis not present

## 2024-07-12 DIAGNOSIS — E876 Hypokalemia: Secondary | ICD-10-CM | POA: Diagnosis not present

## 2024-07-12 DIAGNOSIS — Z7983 Long term (current) use of bisphosphonates: Secondary | ICD-10-CM | POA: Diagnosis not present

## 2024-07-12 DIAGNOSIS — Z79891 Long term (current) use of opiate analgesic: Secondary | ICD-10-CM | POA: Diagnosis not present

## 2024-07-12 DIAGNOSIS — E871 Hypo-osmolality and hyponatremia: Secondary | ICD-10-CM | POA: Diagnosis not present

## 2024-07-12 MED ORDER — MORPHINE SULFATE ER 15 MG PO TBCR: 15.0000 mg | EXTENDED_RELEASE_TABLET | Freq: Two times a day (BID) | ORAL | 0 refills | Status: DC

## 2024-07-13 ENCOUNTER — Inpatient Hospital Stay

## 2024-07-13 ENCOUNTER — Other Ambulatory Visit: Payer: Self-pay

## 2024-07-13 ENCOUNTER — Inpatient Hospital Stay: Attending: Hematology & Oncology

## 2024-07-13 ENCOUNTER — Inpatient Hospital Stay (HOSPITAL_BASED_OUTPATIENT_CLINIC_OR_DEPARTMENT_OTHER): Admitting: Hematology & Oncology

## 2024-07-13 ENCOUNTER — Encounter: Payer: Self-pay | Admitting: *Deleted

## 2024-07-13 VITALS — BP 136/80 | HR 98 | Temp 97.9°F | Resp 18 | Ht 62.0 in | Wt 109.0 lb

## 2024-07-13 DIAGNOSIS — C3491 Malignant neoplasm of unspecified part of right bronchus or lung: Secondary | ICD-10-CM

## 2024-07-13 DIAGNOSIS — C7951 Secondary malignant neoplasm of bone: Secondary | ICD-10-CM | POA: Insufficient documentation

## 2024-07-13 DIAGNOSIS — Z5112 Encounter for antineoplastic immunotherapy: Secondary | ICD-10-CM | POA: Diagnosis not present

## 2024-07-13 DIAGNOSIS — Z79899 Other long term (current) drug therapy: Secondary | ICD-10-CM | POA: Insufficient documentation

## 2024-07-13 DIAGNOSIS — Z79620 Long term (current) use of immunosuppressive biologic: Secondary | ICD-10-CM | POA: Diagnosis not present

## 2024-07-13 DIAGNOSIS — Z87891 Personal history of nicotine dependence: Secondary | ICD-10-CM | POA: Insufficient documentation

## 2024-07-13 DIAGNOSIS — C187 Malignant neoplasm of sigmoid colon: Secondary | ICD-10-CM

## 2024-07-13 DIAGNOSIS — Z5111 Encounter for antineoplastic chemotherapy: Secondary | ICD-10-CM | POA: Diagnosis not present

## 2024-07-13 DIAGNOSIS — C786 Secondary malignant neoplasm of retroperitoneum and peritoneum: Secondary | ICD-10-CM

## 2024-07-13 LAB — CBC WITH DIFFERENTIAL (CANCER CENTER ONLY)
Abs Immature Granulocytes: 0.05 K/uL (ref 0.00–0.07)
Basophils Absolute: 0 K/uL (ref 0.0–0.1)
Basophils Relative: 0 %
Eosinophils Absolute: 0 K/uL (ref 0.0–0.5)
Eosinophils Relative: 0 %
HCT: 27.3 % — ABNORMAL LOW (ref 36.0–46.0)
Hemoglobin: 8.6 g/dL — ABNORMAL LOW (ref 12.0–15.0)
Immature Granulocytes: 1 %
Lymphocytes Relative: 11 %
Lymphs Abs: 0.4 K/uL — ABNORMAL LOW (ref 0.7–4.0)
MCH: 30.6 pg (ref 26.0–34.0)
MCHC: 31.5 g/dL (ref 30.0–36.0)
MCV: 97.2 fL (ref 80.0–100.0)
Monocytes Absolute: 0.4 K/uL (ref 0.1–1.0)
Monocytes Relative: 11 %
Neutro Abs: 2.8 K/uL (ref 1.7–7.7)
Neutrophils Relative %: 77 %
Platelet Count: 273 K/uL (ref 150–400)
RBC: 2.81 MIL/uL — ABNORMAL LOW (ref 3.87–5.11)
RDW: 16.9 % — ABNORMAL HIGH (ref 11.5–15.5)
WBC Count: 3.6 K/uL — ABNORMAL LOW (ref 4.0–10.5)
nRBC: 0 % (ref 0.0–0.2)

## 2024-07-13 LAB — CMP (CANCER CENTER ONLY)
ALT: 29 U/L (ref 0–44)
AST: 29 U/L (ref 15–41)
Albumin: 4.3 g/dL (ref 3.5–5.0)
Alkaline Phosphatase: 81 U/L (ref 38–126)
Anion gap: 13 (ref 5–15)
BUN: 23 mg/dL (ref 8–23)
CO2: 22 mmol/L (ref 22–32)
Calcium: 9 mg/dL (ref 8.9–10.3)
Chloride: 104 mmol/L (ref 98–111)
Creatinine: 0.78 mg/dL (ref 0.44–1.00)
GFR, Estimated: >60 mL/min (ref >60)
Glucose, Bld: 131 mg/dL — ABNORMAL HIGH (ref 70–99)
Potassium: 4.2 mmol/L (ref 3.5–5.1)
Sodium: 139 mmol/L (ref 135–145)
Total Bilirubin: 0.3 mg/dL (ref 0.0–1.2)
Total Protein: 6.5 g/dL (ref 6.5–8.1)

## 2024-07-13 LAB — PREPARE RBC (CROSSMATCH)

## 2024-07-13 LAB — TSH: TSH: 0.221 u[IU]/mL — ABNORMAL LOW (ref 0.350–4.500)

## 2024-07-13 MED ORDER — CYANOCOBALAMIN 1000 MCG/ML IJ SOLN
1000.0000 ug | Freq: Once | INTRAMUSCULAR | Status: AC
  Administered 2024-07-13: 1000 ug via INTRAMUSCULAR
  Filled 2024-07-13: qty 1

## 2024-07-13 MED ORDER — SODIUM CHLORIDE 0.9 % IV SOLN
500.0000 mg/m2 | Freq: Once | INTRAVENOUS | Status: AC
  Administered 2024-07-13: 700 mg via INTRAVENOUS
  Filled 2024-07-13: qty 28

## 2024-07-13 MED ORDER — PALONOSETRON HCL INJECTION 0.25 MG/5ML
0.2500 mg | Freq: Once | INTRAVENOUS | Status: AC
  Administered 2024-07-13: 0.25 mg via INTRAVENOUS
  Filled 2024-07-13: qty 5

## 2024-07-13 MED ORDER — APREPITANT 130 MG/18ML IV EMUL
130.0000 mg | Freq: Once | INTRAVENOUS | Status: AC
  Administered 2024-07-13: 130 mg via INTRAVENOUS
  Filled 2024-07-13: qty 18

## 2024-07-13 MED ORDER — DEXAMETHASONE SOD PHOSPHATE PF 10 MG/ML IJ SOLN
10.0000 mg | Freq: Once | INTRAMUSCULAR | Status: AC
  Administered 2024-07-13: 10 mg via INTRAVENOUS

## 2024-07-13 MED ORDER — SODIUM CHLORIDE 0.9 % IV SOLN: INTRAVENOUS | Status: DC

## 2024-07-13 MED ORDER — SODIUM CHLORIDE 0.9 % IV SOLN
200.0000 mg | Freq: Once | INTRAVENOUS | Status: AC
  Administered 2024-07-13: 200 mg via INTRAVENOUS
  Filled 2024-07-13: qty 8

## 2024-07-13 MED ORDER — SODIUM CHLORIDE 0.9 % IV SOLN
341.0000 mg | Freq: Once | INTRAVENOUS | Status: AC
  Administered 2024-07-13: 340 mg via INTRAVENOUS
  Filled 2024-07-13: qty 34

## 2024-07-13 NOTE — Progress Notes (Signed)
 Patient will proceed with cycle six. She will need a PET after this cycle, before consideration of maintenance. Scheduled for 08/11/2024. She will return tomorrow for symptom management.   Oncology Nurse Navigator Documentation     07/13/2024    9:30 AM  Oncology Nurse Navigator Flowsheets  Navigator Follow Up Date: 08/11/2024  Navigator Follow Up Reason: Scan Review  Navigator Location [facility]  Navigator Encounter Type Treatment  Patient Visit Type MedOnc  Treatment Phase Active Tx  Barriers/Navigation Needs No Barriers At This Time  Interventions Psycho-Social Support  Acuity Level 1-No Barriers  Support Groups/Services Friends and Family  Time Spent with Patient 15

## 2024-07-13 NOTE — Progress Notes (Signed)
 Hematology and Oncology Follow Up Visit  Regina Young [id number] 08-09-1956 68 y.o. 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  She received 5000 rad to the lung and 3000 rad to the spine. Xgeva  120 mg subcu every 3 months-next dose 09/2024  Carbo/Alimta /Keytruda  -- s/p cycle #5 - start on 03/21/2024 Eliquis  5 mg p.o. twice daily --start 02/01/2024     Interim History:  Regina Young is in for follow-up.  She actually looks quite good.  She is still having problems with her right leg.  She is still having radicular pain down the right leg.  However, this seems to be happening only when she bends over.  She is eating better.  She has had no problems with nausea or vomiting.  Has been no change in bowel or bladder habits.  She has had no cough.  She has had no shortness of breath.  There is been no chest wall pain.  She continues on Eliquis .  She is doing well on the Eliquis .  Again, there is no problems with bleeding.  Currently, I would have to say that her performance status is probably ECOG 1.  Medications:  Current Outpatient Medications:    Calcium  Citrate 250 MG TABS, Take 250 mg by mouth in the morning., Disp: , Rfl:    Cholecalciferol  (VITAMIN D3) 50 MCG (2000 UT) TABS, Take 2,000 Units by mouth daily with breakfast., Disp: , Rfl:    cyanocobalamin  (VITAMIN B12) 1000 MCG tablet, Take 1,000 mcg by mouth daily., Disp: , Rfl:    dexamethasone  (DECADRON ) 4 MG tablet, Take 1 tab 2 times daily starting day before pemetrexed . Then take 2 tabs daily x 3 days starting day after carboplatin . Take with food., Disp: 30 tablet, Rfl: 1   ELIQUIS  5 MG TABS tablet, TAKE 1 TABLET(5 MG) BY MOUTH TWICE DAILY, Disp: 60  tablet, Rfl: 3   fluticasone  (FLONASE ) 50 MCG/ACT nasal spray, Place 2 sprays into both nostrils daily., Disp: 16 g, Rfl: 0   folic acid  (FOLVITE ) 1 MG tablet, Take 1 tablet (1 mg total) by mouth daily. Start 7 days before pemetrexed  chemotherapy. Continue until 21 days after pemetrexed  completed., Disp: 100 tablet, Rfl: 3   gabapentin  (NEURONTIN ) 300 MG capsule, TAKE 1 CAPSULE(300 MG) BY MOUTH THREE TIMES DAILY, Disp: 90 capsule, Rfl: 1   ipratropium-albuterol  (DUONEB) 0.5-2.5 (3) MG/3ML SOLN, Take 3 mLs by nebulization 3 (three) times daily for 7 days, THEN 3 mLs every 6 (six) hours as needed., Disp: 360 mL, Rfl: 1   morphine  (MS CONTIN ) 15 MG 12 hr tablet, Take 1 tablet (15 mg total) by mouth every 12 (twelve) hours., Disp: 60 tablet, Rfl: 0   omeprazole  (PRILOSEC ) 20 MG capsule, Take 2 capsules (40 mg total) by mouth daily before breakfast., Disp: 60 capsule, Rfl: 1   ondansetron  (ZOFRAN ) 4 MG tablet, Take 1 tablet (4 mg total) by mouth every 6 (six) hours as needed for nausea., Disp: 20 tablet, Rfl: 0   ondansetron  (ZOFRAN ) 8 MG tablet, Take 1 tablet (8 mg total) by mouth every 8 (eight) hours as needed for nausea or vomiting. Start on the third day after carboplatin ., Disp: 30 tablet, Rfl: 1   oxyCODONE  (OXY IR/ROXICODONE ) 5 MG immediate release tablet, Take one  to two tablets every six hours as needed for pain, Disp: 90 tablet, Rfl: 0   prochlorperazine  (COMPAZINE ) 10 MG tablet, TAKE 1 TABLET(10 MG) BY MOUTH EVERY 6 HOURS AS NEEDED FOR NAUSEA OR VOMITING, Disp: 30 tablet, Rfl: 1   rosuvastatin  (CRESTOR ) 5 MG tablet, Take 5 mg by mouth in the morning., Disp: , Rfl:  No current facility-administered medications for this visit.  Facility-Administered Medications Ordered in Other Visits:    0.9 %  sodium chloride  infusion, , Intravenous, Continuous, Priest Lockridge, Maude SAUNDERS, MD, Stopped at 07/13/24 1312  Allergies: No Known Allergies  Past Medical History, Surgical history, Social history, and Family  History were reviewed and updated.  Review of Systems: Review of Systems  Constitutional: Negative.   HENT:  Negative.    Eyes: Negative.   Respiratory: Negative.    Cardiovascular: Negative.   Gastrointestinal:  Positive for abdominal pain.  Endocrine: Negative.   Genitourinary: Negative.    Musculoskeletal:  Positive for back pain.  Neurological: Negative.   Hematological: Negative.   Psychiatric/Behavioral: Negative.      Physical Exam:   Vital signs show temperature of 98.9.  Pulse 79.  Blood pressure 133/75.  Weight is 109 pounds.  Wt Readings from Last 3 Encounters:  07/13/24 109 lb (49.4 kg)  06/21/24 109 lb (49.4 kg)  05/31/24 107 lb 12.8 oz (48.9 kg)    Physical Exam Vitals reviewed.  HENT:     Head: Normocephalic and atraumatic.  Eyes:     Pupils: Pupils are equal, round, and reactive to light.  Cardiovascular:     Rate and Rhythm: Normal rate and regular rhythm.     Heart sounds: Normal heart sounds.  Pulmonary:     Effort: Pulmonary effort is normal.     Breath sounds: Normal breath sounds.  Abdominal:     General: Bowel sounds are normal.     Palpations: Abdomen is soft.  Musculoskeletal:        General: No tenderness or deformity. Normal range of motion.     Cervical back: Normal range of motion.  Lymphadenopathy:     Cervical: No cervical adenopathy.  Skin:    General: Skin is warm and dry.     Findings: No erythema or rash.  Neurological:     Mental Status: She is alert and oriented to person, place, and time.  Psychiatric:        Behavior: Behavior normal.        Thought Content: Thought content normal.        Judgment: Judgment normal.      Lab Results  Component Value Date   WBC 3.6 (L) 07/13/2024   HGB 8.6 (L) 07/13/2024   HCT 27.3 (L) 07/13/2024   MCV 97.2 07/13/2024   PLT 273 07/13/2024     Chemistry      Component Value Date/Time   NA 139 07/13/2024 0845   K 4.2 07/13/2024 0845   CL 104 07/13/2024 0845   CO2 22  07/13/2024 0845   BUN 23 07/13/2024 0845   CREATININE 0.78 07/13/2024 0845      Component Value Date/Time   CALCIUM  9.0 07/13/2024 0845   ALKPHOS 81 07/13/2024 0845   AST 29 07/13/2024 0845   ALT 29 07/13/2024 0845   BILITOT 0.3 07/13/2024 0845      Impression and Plan: Ms. Bodiford is a very charming 68 year old white female.  She has a past history of an early stage colon cancer.  However, now it looks like  she has a metastatic non-small cell lung cancer-adenocarcinoma.  She completed the radiotherapy for the sacral metastasis and the lung primary.  For right now, we will continue her on treatment.  This will be her 6th cycle of treatment.  After this cycle, we will repeat her PET scan and see how everything looks.  Hopefully, we will be able to see some improvement.  As far as further therapy, I think this will be dependent upon what the PET scan shows.  It would be nice to try to get her on some kind of maintenance therapy.  I told her that I did not think that this fracture of the sacrum would ever heal up fully.  I really hate that for her.  I really think she would benefit from 1 unit of blood.  We will have to workup the anemia when we see her back.  I think she would benefit and have a better quality of life.  We will work around the Thanksgiving holiday with respect to her treatment.    Maude JONELLE Crease, MD 11/6/20251:32 PM

## 2024-07-13 NOTE — Patient Instructions (Signed)
 [facility] - A DEPT OF [facility]  Discharge Instructions: Thank you for choosing [facility] to provide your oncology and hematology care.   If you have a lab appointment with the Cancer Center, please go directly to the Cancer Center and check in at the registration area.  Wear comfortable clothing and clothing appropriate for easy access to any Portacath or PICC line.   We strive to give you quality time with your provider. You may need to reschedule your appointment if you arrive late (15 or more minutes).  Arriving late affects you and other patients whose appointments are after yours.  Also, if you miss three or more appointments without notifying the office, you may be dismissed from the clinic at the provider's discretion.      For prescription refill requests, have your pharmacy contact our office and allow 72 hours for refills to be completed.    Today you received the following chemotherapy and/or immunotherapy agents Keytruda /Alimta /Carboplatin       To help prevent nausea and vomiting after your treatment, we encourage you to take your nausea medication as directed.  BELOW ARE SYMPTOMS THAT SHOULD BE REPORTED IMMEDIATELY: *FEVER GREATER THAN 100.4 F (38 C) OR HIGHER *CHILLS OR SWEATING *NAUSEA AND VOMITING THAT IS NOT CONTROLLED WITH YOUR NAUSEA MEDICATION *UNUSUAL SHORTNESS OF BREATH *UNUSUAL BRUISING OR BLEEDING *URINARY PROBLEMS (pain or burning when urinating, or frequent urination) *BOWEL PROBLEMS (unusual diarrhea, constipation, pain near the anus) TENDERNESS IN MOUTH AND THROAT WITH OR WITHOUT PRESENCE OF ULCERS (sore throat, sores in mouth, or a toothache) UNUSUAL RASH, SWELLING OR PAIN  UNUSUAL VAGINAL DISCHARGE OR ITCHING   Items with * indicate a potential emergency and should be followed up as soon as possible or go to the Emergency Department if any problems should occur.  Please show the CHEMOTHERAPY ALERT CARD  or IMMUNOTHERAPY ALERT CARD at check-in to the Emergency Department and triage nurse. Should you have questions after your visit or need to cancel or reschedule your appointment, please contact [facility] - A DEPT OF [facility]  [phone] and follow the prompts.  Office hours are 8:00 a.m. to 4:30 p.m. Monday - Friday. Please note that voicemails left after 4:00 p.m. may not be returned until the following business day.  We are closed weekends and major holidays. You have access to a nurse at all times for urgent questions. Please call the main number to the clinic [phone] and follow the prompts.  For any non-urgent questions, you may also contact your provider using MyChart. We now offer e-Visits for anyone 80 and older to request care online for non-urgent symptoms. For details visit [email or web address].   Also download the MyChart app! Go to the app store, search MyChart, open the app, select [place], and log in with your MyChart username and password.

## 2024-07-13 NOTE — Patient Instructions (Signed)

## 2024-07-14 ENCOUNTER — Other Ambulatory Visit (HOSPITAL_BASED_OUTPATIENT_CLINIC_OR_DEPARTMENT_OTHER): Payer: Self-pay

## 2024-07-14 ENCOUNTER — Encounter: Payer: Self-pay | Admitting: Hematology & Oncology

## 2024-07-14 ENCOUNTER — Inpatient Hospital Stay

## 2024-07-14 DIAGNOSIS — C7951 Secondary malignant neoplasm of bone: Secondary | ICD-10-CM | POA: Diagnosis not present

## 2024-07-14 DIAGNOSIS — C3491 Malignant neoplasm of unspecified part of right bronchus or lung: Secondary | ICD-10-CM

## 2024-07-14 DIAGNOSIS — Z5112 Encounter for antineoplastic immunotherapy: Secondary | ICD-10-CM | POA: Diagnosis not present

## 2024-07-14 DIAGNOSIS — Z79620 Long term (current) use of immunosuppressive biologic: Secondary | ICD-10-CM | POA: Diagnosis not present

## 2024-07-14 DIAGNOSIS — Z79899 Other long term (current) drug therapy: Secondary | ICD-10-CM | POA: Diagnosis not present

## 2024-07-14 DIAGNOSIS — Z5111 Encounter for antineoplastic chemotherapy: Secondary | ICD-10-CM | POA: Diagnosis not present

## 2024-07-14 DIAGNOSIS — Z87891 Personal history of nicotine dependence: Secondary | ICD-10-CM | POA: Diagnosis not present

## 2024-07-14 LAB — T4: T4, Total: 8.8 ug/dL (ref 4.5–12.0)

## 2024-07-14 MED ORDER — LIDOCAINE-PRILOCAINE 2.5-2.5 % EX CREA
1.0000 | TOPICAL_CREAM | CUTANEOUS | 3 refills | Status: AC | PRN
  Filled 2024-07-14: qty 30, 30d supply, fill #0

## 2024-07-14 MED ORDER — SODIUM CHLORIDE 0.9% IV SOLUTION
250.0000 mL | INTRAVENOUS | Status: DC
  Administered 2024-07-14: 250 mL via INTRAVENOUS

## 2024-07-14 NOTE — Patient Instructions (Signed)

## 2024-07-17 LAB — TYPE AND SCREEN
ABO/RH(D): A POS
Antibody Screen: NEGATIVE
Unit division: 0

## 2024-07-17 LAB — BPAM RBC
Blood Product Expiration Date: 202512022359
ISSUE DATE / TIME: 202511070756
Unit Type and Rh: 6200

## 2024-07-18 ENCOUNTER — Ambulatory Visit (INDEPENDENT_AMBULATORY_CARE_PROVIDER_SITE_OTHER): Admitting: Pulmonary Disease

## 2024-07-18 ENCOUNTER — Encounter: Payer: Self-pay | Admitting: Hematology & Oncology

## 2024-07-18 VITALS — BP 134/80 | HR 88 | Temp 98.4°F | Ht 61.0 in | Wt 107.0 lb

## 2024-07-18 DIAGNOSIS — C3491 Malignant neoplasm of unspecified part of right bronchus or lung: Secondary | ICD-10-CM | POA: Diagnosis not present

## 2024-07-18 DIAGNOSIS — J9601 Acute respiratory failure with hypoxia: Secondary | ICD-10-CM | POA: Diagnosis not present

## 2024-07-18 NOTE — Patient Instructions (Signed)
  VISIT SUMMARY: Today, we discussed your ongoing treatment for stage IV adenocarcinoma of the right lung and addressed your respiratory and neurological symptoms. You are continuing with your chemotherapy regimen and have a follow-up PET scan scheduled. We also talked about your right leg symptoms and provided a temporary handicap parking permit to assist with mobility.  YOUR PLAN: STAGE IV ADENOCARCINOMA OF RIGHT LUNG WITH SACRAL METASTASIS: You have stage IV adenocarcinoma of the right lung with metastasis to the sacrum. Your recent PET scan shows some activity on the right side, which could be due to radiation effects or cancer progression. -Continue your chemotherapy regimen with carboplatin , Altima, and Keytruda . -Your next PET scan is scheduled for December 5th to assess your treatment response. -Keep doing your breathing exercises as advised by your oncologist.  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 -You have been given a temporary handicap parking permit to help with mobility. -Continue with physical activity as much as you can tolerate.  Contains text generated by [employer].

## 2024-07-18 NOTE — Progress Notes (Signed)
 Regina Young    [id number]    1956-08-29  Primary Care Physician:Raju, Trula SQUIBB, MD  Referring Physician: Dwight Trula SQUIBB, MD [address] [place],  [place] [address]  Chief complaint: Follow-up after recent hospitalization for pneumonia, stage IV lung cancer  HPI: 68 y.o. 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  Discussed the use of AI scribe software for clinical note transcription with the patient, who gave verbal consent to proceed.  History of Present Illness Regina Young is a 68 year old female with metastatic lung cancer who presents for follow-up after recent hospitalization for pneumonia.  Pulmonary symptoms and recent hospitalization - Hospitalized in early July 2025 for lung inflammation, suspected to be pneumonia - Treated with antibiotics and prednisone  during hospitalization - Required supplemental oxygen during hospital stay, but discharged without oxygen - No current dyspnea or breathing difficulties - Has not used nebulizer since initial treatment period; prefers mask over hose for nebulizer treatments  Neurological and musculoskeletal symptoms due to sacral metastasis - Persistent right leg heaviness described as 'three hundred pound right leg' - Attributed to tumor on pelvis compressing vertebrae - Right leg symptoms have been ongoing since beginning of treatment  Oncologic history and treatment access - Diagnosed with colon carcinoma in 2022 - Recent diagnosis of lung cancer in 2025 and underwent radiation treatment ending June 2025 - Currently undergoing chemotherapy with carboplatin , Alimta , and Keytruda .  Finish 6 cycles  of chemotherapy.  Interim history: Regina Young is a 68 year old female with stage four adenocarcinoma of the right lung who presents for follow-up of her condition.  Pulmonary neoplasm and oncologic management - Stage IV adenocarcinoma of the right lung - Undergoing chemotherapy with carboplatin , Altima, and Keytruda ; completed 6 of 36 planned cycles - Completed radiation therapy to the right lung and sacrum in June 2025 - Recent PET scan demonstrates stability with some uptake in the right lung; next scan scheduled for August 11, 2024  Respiratory symptoms and functional status - Breathing has improved since hospitalization for pneumonia in June 2025 - Continues breathing exercises and uses medication for breathing treatments - Participates in physical therapy and walking exercises to enhance respiratory function  Neurological symptoms affecting mobility - Significant right leg symptoms including tingling and lack of sensation - Impaired mobility due to right leg symptoms - Drives short distances cautiously, avoids heavy traffic  Relevant Pulmonary history: Pets: Occupation: Exposures: No h/o chemo/XRT/amiodarone/macrodantin/MTX  No exposure to asbestos, silica or other organic allergens  Smoking history: Travel history: Family history:  Outpatient Encounter Medications as of 07/18/2024  Medication Sig   Calcium  Citrate 250 MG TABS Take 250 mg by mouth in the morning.   Cholecalciferol  (VITAMIN D3) 50 MCG (2000 UT) TABS Take 2,000 Units by mouth daily with breakfast.   cyanocobalamin  (VITAMIN B12) 1000 MCG tablet Take 1,000 mcg by mouth daily.   ELIQUIS  5 MG TABS tablet TAKE 1 TABLET(5 MG) BY MOUTH TWICE DAILY   fluticasone  (FLONASE ) 50 MCG/ACT nasal spray Place 2 sprays into both nostrils daily.   folic acid  (FOLVITE ) 1 MG tablet Take 1 tablet (1 mg total) by mouth daily. Start 7 days before pemetrexed  chemotherapy. Continue until 37  days after pemetrexed  completed.    gabapentin  (NEURONTIN ) 300 MG capsule TAKE 1 CAPSULE(300 MG) BY MOUTH THREE TIMES DAILY   ipratropium-albuterol  (DUONEB) 0.5-2.5 (3) MG/3ML SOLN Take 3 mLs by nebulization 3 (three) times daily for 7 days, THEN 3 mLs every 6 (six) hours as needed.   morphine  (MS CONTIN ) 15 MG 12 hr tablet Take 1 tablet (15 mg total) by mouth every 12 (twelve) hours.   omeprazole  (PRILOSEC ) 20 MG capsule Take 2 capsules (40 mg total) by mouth daily before breakfast.   ondansetron  (ZOFRAN ) 4 MG tablet Take 1 tablet (4 mg total) by mouth every 6 (six) hours as needed for nausea.   ondansetron  (ZOFRAN ) 8 MG tablet Take 1 tablet (8 mg total) by mouth every 8 (eight) hours as needed for nausea or vomiting. Start on the third day after carboplatin .   oxyCODONE  (OXY IR/ROXICODONE ) 5 MG immediate release tablet Take one to two tablets every six hours as needed for pain   prochlorperazine  (COMPAZINE ) 10 MG tablet TAKE 1 TABLET(10 MG) BY MOUTH EVERY 6 HOURS AS NEEDED FOR NAUSEA OR VOMITING (Patient taking differently: PRN)   rosuvastatin  (CRESTOR ) 5 MG tablet Take 5 mg by mouth in the morning.   dexamethasone  (DECADRON ) 4 MG tablet Take 1 tab 2 times daily starting day before pemetrexed . Then take 2 tabs daily x 3 days starting day after carboplatin . Take with food.   lidocaine -prilocaine  (EMLA ) cream Apply 1 Application topically as needed. (Patient not taking: Reported on 07/18/2024)   No facility-administered encounter medications on file as of 07/18/2024.    Physical Exam: Today's Vitals   07/18/24 1359  BP: 134/80  Pulse: 88  Temp: 98.4 F (36.9 C)  TempSrc: Oral  SpO2: 96%  Weight: 107 lb (48.5 kg)  Height: 5' 1 (1.549 m)   Body mass index is 20.22 kg/m.  Physical Exam GEN: No acute distress CV: Regular rate and rhythm no murmurs LUNGS: Clear to auscultation bilaterally normal respiratory effort SKIN JOINTS: Warm and dry no rash    Data Reviewed: Imaging: CT chest 03/04/2024-pleural effusion,  progressive airspace consolidation bilaterally concerning for progressive pneumonia.  Diffuse interstitial thickening, ground glass attenuation.  4 mm nodule in the right upper lobe.  PET scan 05/26/2024-progressive hypermetabolic activity with consolidation in the right upper lobe and superior segment of the right lower lobe.  Persistent presacral soft tissue nodes.  Hypermetabolic activity through the sacrum.  Right hydroureter, hydro nephrosis I have reviewed the images personally.  PFTs:  Labs: Assessment & Plan Stage IV adenocarcinoma of right lung with sacral metastasis Stage IV adenocarcinoma of the right lung with sacral metastasis. Recent PET scan shows increased uptake on the right side, possibly due to cancer progression.  She had an admission for pneumonia in June 2025 but no symptoms to suggest ongoing infection as she looks good.  Noted to have positive Fungitell during admission for pneumonia but suspicion for invasive fungal infection is low as CT scan is not typical.  She completed six cycles of chemotherapy with carboplatin , Altima, and Keytruda . No significant aggression noted by oncologist, but further evaluation needed. - Continue chemotherapy regimen with carboplatin , Altima, and Keytruda . - Scheduled follow-up PET scan by Dr. Timmy on December 5th to assess treatment response.  If she has persisting or worsening PET avid right upper lobe activity then we can consider bronchoscopy.  Peripheral neuropathy of right leg and foot Peripheral neuropathy in the right leg and foot, described as 'three hundred pound clubfoot'. Symptoms include tingling  and lack of sensation, affecting mobility and driving ability. Improvement noted in walking, but pain persists in the hip and foot. - Provided temporary handicap parking permit to assist with mobility. - Encouraged continued physical activity as tolerated.  Recommendations: Continue supportive care.  Review follow-up  imaging  Lonna Coder MD [facility] 07/18/2024, 2:23 PM  CC: Dwight Trula SQUIBB, MD

## 2024-07-20 ENCOUNTER — Telehealth: Payer: Self-pay

## 2024-07-20 ENCOUNTER — Other Ambulatory Visit (HOSPITAL_COMMUNITY): Payer: Self-pay

## 2024-07-20 NOTE — Telephone Encounter (Signed)
 Oral Oncology Patient Advocate Encounter   Received notification that prior authorization for lidocaine -prilocaine   is required.   PA submitted on 07/20/24 Key [id number] Status is pending      Charlott Hamilton,  CPhT-Adv  she/her/hers [facility]  [facility] Pharmacy Technician Patient Advocate Specialist III [facility] Phone: [phone]  Fax: [phone] [email or web address]

## 2024-07-20 NOTE — Telephone Encounter (Signed)
 Oral Oncology Patient Advocate Encounter  Prior Authorization for lidocaine -prilocaine  CR has been approved.    PA# [id number] Effective dates: 07/20/24 through 07/20/25  Patient has been notified via MyChart     Charlott Hamilton,  CPhT-Adv  she/her/hers [facility]  [facility] Pharmacy Technician Patient Advocate Specialist III [facility] Phone: [phone]  Fax: [phone] [email or web address]

## 2024-07-24 ENCOUNTER — Other Ambulatory Visit: Payer: Self-pay | Admitting: *Deleted

## 2024-07-24 DIAGNOSIS — C3490 Malignant neoplasm of unspecified part of unspecified bronchus or lung: Secondary | ICD-10-CM

## 2024-07-24 DIAGNOSIS — C786 Secondary malignant neoplasm of retroperitoneum and peritoneum: Secondary | ICD-10-CM

## 2024-07-24 DIAGNOSIS — C187 Malignant neoplasm of sigmoid colon: Secondary | ICD-10-CM

## 2024-07-24 DIAGNOSIS — C3491 Malignant neoplasm of unspecified part of right bronchus or lung: Secondary | ICD-10-CM

## 2024-07-24 MED ORDER — OXYCODONE HCL 5 MG PO TABS: ORAL_TABLET | ORAL | 0 refills | Status: DC

## 2024-08-02 ENCOUNTER — Other Ambulatory Visit: Payer: Self-pay

## 2024-08-02 ENCOUNTER — Inpatient Hospital Stay: Admitting: Hematology & Oncology

## 2024-08-02 ENCOUNTER — Inpatient Hospital Stay

## 2024-08-08 DIAGNOSIS — H2513 Age-related nuclear cataract, bilateral: Secondary | ICD-10-CM | POA: Diagnosis not present

## 2024-08-08 DIAGNOSIS — H04123 Dry eye syndrome of bilateral lacrimal glands: Secondary | ICD-10-CM | POA: Diagnosis not present

## 2024-08-10 ENCOUNTER — Other Ambulatory Visit: Payer: Self-pay | Admitting: *Deleted

## 2024-08-10 DIAGNOSIS — C3491 Malignant neoplasm of unspecified part of right bronchus or lung: Secondary | ICD-10-CM

## 2024-08-10 DIAGNOSIS — C187 Malignant neoplasm of sigmoid colon: Secondary | ICD-10-CM

## 2024-08-10 DIAGNOSIS — C786 Secondary malignant neoplasm of retroperitoneum and peritoneum: Secondary | ICD-10-CM

## 2024-08-10 DIAGNOSIS — C3490 Malignant neoplasm of unspecified part of unspecified bronchus or lung: Secondary | ICD-10-CM

## 2024-08-10 MED ORDER — OXYCODONE HCL 5 MG PO TABS: ORAL_TABLET | ORAL | 0 refills | Status: DC

## 2024-08-10 MED ORDER — MORPHINE SULFATE ER 15 MG PO TBCR: 15.0000 mg | EXTENDED_RELEASE_TABLET | Freq: Two times a day (BID) | ORAL | 0 refills | Status: DC

## 2024-08-11 ENCOUNTER — Encounter (HOSPITAL_COMMUNITY)
Admission: RE | Admit: 2024-08-11 | Discharge: 2024-08-11 | Disposition: A | Source: Ambulatory Visit | Attending: Hematology & Oncology | Admitting: Hematology & Oncology

## 2024-08-11 DIAGNOSIS — C7951 Secondary malignant neoplasm of bone: Secondary | ICD-10-CM | POA: Diagnosis not present

## 2024-08-11 DIAGNOSIS — C3491 Malignant neoplasm of unspecified part of right bronchus or lung: Secondary | ICD-10-CM

## 2024-08-11 LAB — GLUCOSE, CAPILLARY: Glucose-Capillary: 98 mg/dL (ref 70–99)

## 2024-08-11 MED ORDER — FLUDEOXYGLUCOSE F - 18 (FDG) INJECTION
5.2000 | Freq: Once | INTRAVENOUS | Status: AC
  Administered 2024-08-11: 5.32 via INTRAVENOUS

## 2024-08-16 ENCOUNTER — Encounter: Payer: Self-pay | Admitting: *Deleted

## 2024-08-16 NOTE — Progress Notes (Signed)
 PET reviewed which shows mixed response.   Oncology Nurse Navigator Documentation     08/16/2024    7:15 AM  Oncology Nurse Navigator Flowsheets  Navigator Follow Up Date: 08/17/2024  Navigator Location [facility]  Navigator Encounter Type Scan Review  Patient Visit Type MedOnc  Treatment Phase Active Tx  Barriers/Navigation Needs No Barriers At This Time  Interventions None Required  Acuity Level 1-No Barriers  Support Groups/Services Friends and Family  Time Spent with Patient 15

## 2024-08-17 ENCOUNTER — Inpatient Hospital Stay: Attending: Hematology & Oncology

## 2024-08-17 ENCOUNTER — Encounter: Payer: Self-pay | Admitting: Hematology & Oncology

## 2024-08-17 ENCOUNTER — Inpatient Hospital Stay

## 2024-08-17 ENCOUNTER — Encounter: Payer: Self-pay | Admitting: *Deleted

## 2024-08-17 ENCOUNTER — Inpatient Hospital Stay (HOSPITAL_BASED_OUTPATIENT_CLINIC_OR_DEPARTMENT_OTHER): Admitting: Hematology & Oncology

## 2024-08-17 VITALS — BP 127/73 | HR 72 | Temp 97.9°F | Resp 20 | Ht 62.0 in | Wt 105.8 lb

## 2024-08-17 DIAGNOSIS — C3490 Malignant neoplasm of unspecified part of unspecified bronchus or lung: Secondary | ICD-10-CM

## 2024-08-17 DIAGNOSIS — K7689 Other specified diseases of liver: Secondary | ICD-10-CM

## 2024-08-17 DIAGNOSIS — N1330 Unspecified hydronephrosis: Secondary | ICD-10-CM

## 2024-08-17 DIAGNOSIS — D649 Anemia, unspecified: Secondary | ICD-10-CM | POA: Diagnosis not present

## 2024-08-17 DIAGNOSIS — Z90710 Acquired absence of both cervix and uterus: Secondary | ICD-10-CM

## 2024-08-17 DIAGNOSIS — C3491 Malignant neoplasm of unspecified part of right bronchus or lung: Secondary | ICD-10-CM

## 2024-08-17 DIAGNOSIS — C786 Secondary malignant neoplasm of retroperitoneum and peritoneum: Secondary | ICD-10-CM | POA: Diagnosis not present

## 2024-08-17 DIAGNOSIS — R590 Localized enlarged lymph nodes: Secondary | ICD-10-CM

## 2024-08-17 DIAGNOSIS — D734 Cyst of spleen: Secondary | ICD-10-CM | POA: Diagnosis not present

## 2024-08-17 DIAGNOSIS — J9601 Acute respiratory failure with hypoxia: Secondary | ICD-10-CM

## 2024-08-17 DIAGNOSIS — E785 Hyperlipidemia, unspecified: Secondary | ICD-10-CM

## 2024-08-17 DIAGNOSIS — R55 Syncope and collapse: Secondary | ICD-10-CM

## 2024-08-17 DIAGNOSIS — C187 Malignant neoplasm of sigmoid colon: Secondary | ICD-10-CM

## 2024-08-17 DIAGNOSIS — Z5189 Encounter for other specified aftercare: Secondary | ICD-10-CM | POA: Insufficient documentation

## 2024-08-17 DIAGNOSIS — Z5112 Encounter for antineoplastic immunotherapy: Secondary | ICD-10-CM | POA: Insufficient documentation

## 2024-08-17 DIAGNOSIS — Z85038 Personal history of other malignant neoplasm of large intestine: Secondary | ICD-10-CM

## 2024-08-17 DIAGNOSIS — N134 Hydroureter: Secondary | ICD-10-CM

## 2024-08-17 DIAGNOSIS — R1900 Intra-abdominal and pelvic swelling, mass and lump, unspecified site: Secondary | ICD-10-CM | POA: Diagnosis not present

## 2024-08-17 DIAGNOSIS — Z79620 Long term (current) use of immunosuppressive biologic: Secondary | ICD-10-CM | POA: Insufficient documentation

## 2024-08-17 DIAGNOSIS — J189 Pneumonia, unspecified organism: Secondary | ICD-10-CM

## 2024-08-17 DIAGNOSIS — E860 Dehydration: Secondary | ICD-10-CM

## 2024-08-17 DIAGNOSIS — E871 Hypo-osmolality and hyponatremia: Secondary | ICD-10-CM

## 2024-08-17 DIAGNOSIS — Z5111 Encounter for antineoplastic chemotherapy: Secondary | ICD-10-CM | POA: Diagnosis present

## 2024-08-17 DIAGNOSIS — I82621 Acute embolism and thrombosis of deep veins of right upper extremity: Secondary | ICD-10-CM

## 2024-08-17 DIAGNOSIS — C7951 Secondary malignant neoplasm of bone: Secondary | ICD-10-CM | POA: Insufficient documentation

## 2024-08-17 DIAGNOSIS — R7401 Elevation of levels of liver transaminase levels: Secondary | ICD-10-CM

## 2024-08-17 LAB — FERRITIN: Ferritin: 578 ng/mL — ABNORMAL HIGH (ref 11–307)

## 2024-08-17 LAB — CBC WITH DIFFERENTIAL (CANCER CENTER ONLY)
Abs Immature Granulocytes: 0.08 K/uL — ABNORMAL HIGH (ref 0.00–0.07)
Basophils Absolute: 0 K/uL (ref 0.0–0.1)
Basophils Relative: 0 %
Eosinophils Absolute: 0 K/uL (ref 0.0–0.5)
Eosinophils Relative: 0 %
HCT: 30.8 % — ABNORMAL LOW (ref 36.0–46.0)
Hemoglobin: 10 g/dL — ABNORMAL LOW (ref 12.0–15.0)
Immature Granulocytes: 1 %
Lymphocytes Relative: 6 %
Lymphs Abs: 0.5 K/uL — ABNORMAL LOW (ref 0.7–4.0)
MCH: 31.1 pg (ref 26.0–34.0)
MCHC: 32.5 g/dL (ref 30.0–36.0)
MCV: 95.7 fL (ref 80.0–100.0)
Monocytes Absolute: 0.6 K/uL (ref 0.1–1.0)
Monocytes Relative: 8 %
Neutro Abs: 6.4 K/uL (ref 1.7–7.7)
Neutrophils Relative %: 85 %
Platelet Count: 227 K/uL (ref 150–400)
RBC: 3.22 MIL/uL — ABNORMAL LOW (ref 3.87–5.11)
RDW: 14.5 % (ref 11.5–15.5)
WBC Count: 7.5 K/uL (ref 4.0–10.5)
nRBC: 0 % (ref 0.0–0.2)

## 2024-08-17 LAB — CMP (CANCER CENTER ONLY)
ALT: 11 U/L (ref 0–44)
AST: 19 U/L (ref 15–41)
Albumin: 4.4 g/dL (ref 3.5–5.0)
Alkaline Phosphatase: 80 U/L (ref 38–126)
Anion gap: 12 (ref 5–15)
BUN: 19 mg/dL (ref 8–23)
CO2: 26 mmol/L (ref 22–32)
Calcium: 9.7 mg/dL (ref 8.9–10.3)
Chloride: 102 mmol/L (ref 98–111)
Creatinine: 0.76 mg/dL (ref 0.44–1.00)
GFR, Estimated: >60 mL/min (ref >60)
Glucose, Bld: 126 mg/dL — ABNORMAL HIGH (ref 70–99)
Potassium: 4.4 mmol/L (ref 3.5–5.1)
Sodium: 139 mmol/L (ref 135–145)
Total Bilirubin: 0.3 mg/dL (ref 0.0–1.2)
Total Protein: 6.8 g/dL (ref 6.5–8.1)

## 2024-08-17 LAB — IRON AND IRON BINDING CAPACITY (CC-WL,HP ONLY)
Iron: 64 ug/dL (ref 28–170)
Saturation Ratios: 26 % (ref 10.4–31.8)
TIBC: 249 ug/dL — ABNORMAL LOW (ref 250–450)
UIBC: 185 ug/dL

## 2024-08-17 LAB — LACTATE DEHYDROGENASE: LDH: 250 U/L — ABNORMAL HIGH (ref 105–235)

## 2024-08-17 MED ORDER — OXYCODONE HCL 5 MG PO TABS: ORAL_TABLET | ORAL | 0 refills | Status: DC

## 2024-08-17 MED ORDER — CYANOCOBALAMIN 1000 MCG/ML IJ SOLN: 1000.0000 ug | Freq: Once | INTRAMUSCULAR | Status: DC

## 2024-08-17 MED ORDER — MORPHINE SULFATE ER 30 MG PO TBCR: 30.0000 mg | EXTENDED_RELEASE_TABLET | Freq: Two times a day (BID) | ORAL | 0 refills | Status: DC

## 2024-08-17 MED ORDER — CYANOCOBALAMIN 1000 MCG/ML IJ SOLN
1000.0000 ug | Freq: Once | INTRAMUSCULAR | Status: AC
  Administered 2024-08-17: 1000 ug via INTRAMUSCULAR
  Filled 2024-08-17: qty 1

## 2024-08-17 NOTE — Patient Instructions (Signed)

## 2024-08-17 NOTE — Progress Notes (Signed)
 Hematology and Oncology Follow Up Visit  Regina Young [id number] Oct 20, 1955 68 y.o. 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  She received 5000 rad to the lung and 3000 rad to the spine. Xgeva  120 mg subcu every 3 months-next dose 09/2024  Carbo/Alimta /Keytruda  -- s/p cycle #6 - start on 03/21/2024 - d/c on 08/17/2024 Taxotere/ Cyramza - start cycle #1 on 08/23/2024 Eliquis  5 mg p.o. twice daily --start 02/01/2024     Interim History:  Regina Young is in for follow-up.  Unfortunately, I think that we do have an issue with her cancer.  We did a PET scan on her.  This was done on 08/11/2024.  It did show that in the sacral area, there was increased in fullness and hypermetabolic activity suggesting direct tumor extension or nodal metastasis.  I would have to think that this is probably why she is still having issues with pain.  Had a long talk with she and her husband.  I think we will have to increase the MS Contin  to 30 mg p.o. twice daily.  Hopefully, this will allow her to take less of the oxycodone .  I spoke with Dr. Shannon of Radiation Oncology.  He will see if she can have any more radiotherapy to the sacral area.  She is having no problems with bowels or bladder.  She is having no problems with cough or shortness of breath.  She is having no bleeding.  There is no fever.  She has had the radicular pain in the right leg.  Currently, I would have to say that her performance status is probably ECOG 1.   Medications:  Current Outpatient Medications:    Calcium  Citrate 250 MG TABS, Take 250 mg by mouth in the morning., Disp: , Rfl:    Cholecalciferol  (VITAMIN D3) 50 MCG (2000 UT) TABS, Take 2,000  Units by mouth daily with breakfast., Disp: , Rfl:    cyanocobalamin  (VITAMIN B12) 1000 MCG tablet, Take 1,000 mcg by mouth daily., Disp: , Rfl:    dexamethasone  (DECADRON ) 4 MG tablet, Take 1 tab 2 times daily starting day before pemetrexed . Then take 2 tabs daily x 3 days starting day after carboplatin . Take with food., Disp: 30 tablet, Rfl: 1   ELIQUIS  5 MG TABS tablet, TAKE 1 TABLET(5 MG) BY MOUTH TWICE DAILY, Disp: 60 tablet, Rfl: 3   fluticasone  (FLONASE ) 50 MCG/ACT nasal spray, Place 2 sprays into both nostrils daily., Disp: 16 g, Rfl: 0   folic acid  (FOLVITE ) 1 MG tablet, Take 1 tablet (1 mg total) by mouth daily. Start 7 days before pemetrexed  chemotherapy. Continue until 21 days after pemetrexed  completed., Disp: 100 tablet, Rfl: 3   gabapentin  (NEURONTIN ) 300 MG capsule, TAKE 1 CAPSULE(300 MG) BY MOUTH THREE TIMES DAILY, Disp: 90 capsule, Rfl: 1   ipratropium-albuterol  (DUONEB) 0.5-2.5 (3) MG/3ML SOLN, Take 3 mLs by nebulization 3 (three) times daily for 7 days, THEN 3 mLs every 6 (six) hours as needed., Disp: 360 mL, Rfl: 1   lidocaine -prilocaine  (EMLA ) cream, Apply 1 Application topically as needed., Disp: 30 g, Rfl: 3   morphine  (MS CONTIN ) 15 MG 12 hr tablet, Take 1 tablet (15 mg total) by mouth every 12 (twelve) hours., Disp: 60 tablet, Rfl: 0  omeprazole  (PRILOSEC ) 20 MG capsule, Take 2 capsules (40 mg total) by mouth daily before breakfast., Disp: 60 capsule, Rfl: 1   ondansetron  (ZOFRAN ) 4 MG tablet, Take 1 tablet (4 mg total) by mouth every 6 (six) hours as needed for nausea., Disp: 20 tablet, Rfl: 0   ondansetron  (ZOFRAN ) 8 MG tablet, Take 1 tablet (8 mg total) by mouth every 8 (eight) hours as needed for nausea or vomiting. Start on the third day after carboplatin ., Disp: 30 tablet, Rfl: 1   oxyCODONE  (OXY IR/ROXICODONE ) 5 MG immediate release tablet, Take one to two tablets every six hours as needed for pain, Disp: 90 tablet, Rfl: 0   prochlorperazine  (COMPAZINE ) 10 MG tablet,  TAKE 1 TABLET(10 MG) BY MOUTH EVERY 6 HOURS AS NEEDED FOR NAUSEA OR VOMITING, Disp: 30 tablet, Rfl: 1   rosuvastatin  (CRESTOR ) 5 MG tablet, Take 5 mg by mouth in the morning., Disp: , Rfl:   Allergies: No Known Allergies  Past Medical History, Surgical history, Social history, and Family History were reviewed and updated.  Review of Systems: Review of Systems  Constitutional: Negative.   HENT:  Negative.    Eyes: Negative.   Respiratory: Negative.    Cardiovascular: Negative.   Gastrointestinal:  Positive for abdominal pain.  Endocrine: Negative.   Genitourinary: Negative.    Musculoskeletal:  Positive for back pain.  Neurological: Negative.   Hematological: Negative.   Psychiatric/Behavioral: Negative.      Physical Exam:   Vital signs show temperature of 97.9.  Pulse 72.  Blood pressure 127/73.  Weight is at 105 pounds.   Wt Readings from Last 3 Encounters:  08/17/24 105 lb 12.8 oz (48 kg)  07/18/24 107 lb (48.5 kg)  07/13/24 109 lb (49.4 kg)    Physical Exam Vitals reviewed.  HENT:     Head: Normocephalic and atraumatic.  Eyes:     Pupils: Pupils are equal, round, and reactive to light.  Cardiovascular:     Rate and Rhythm: Normal rate and regular rhythm.     Heart sounds: Normal heart sounds.  Pulmonary:     Effort: Pulmonary effort is normal.     Breath sounds: Normal breath sounds.  Abdominal:     General: Bowel sounds are normal.     Palpations: Abdomen is soft.  Musculoskeletal:        General: No tenderness or deformity. Normal range of motion.     Cervical back: Normal range of motion.  Lymphadenopathy:     Cervical: No cervical adenopathy.  Skin:    General: Skin is warm and dry.     Findings: No erythema or rash.  Neurological:     Mental Status: She is alert and oriented to person, place, and time.  Psychiatric:        Behavior: Behavior normal.        Thought Content: Thought content normal.        Judgment: Judgment normal.      Lab  Results  Component Value Date   WBC 7.5 08/17/2024   HGB 10.0 (L) 08/17/2024   HCT 30.8 (L) 08/17/2024   MCV 95.7 08/17/2024   PLT 227 08/17/2024     Chemistry      Component Value Date/Time   NA 139 08/17/2024 0852   K 4.4 08/17/2024 0852   CL 102 08/17/2024 0852   CO2 26 08/17/2024 0852   BUN 19 08/17/2024 0852   CREATININE 0.76 08/17/2024 0852      Component Value Date/Time  CALCIUM  9.7 08/17/2024 0852   ALKPHOS 80 08/17/2024 0852   AST 19 08/17/2024 0852   ALT 11 08/17/2024 0852   BILITOT 0.3 08/17/2024 0852      Impression and Plan: Ms. Orona is a very charming 68 year old white female.  She has a past history of an early stage colon cancer.  However, now it looks like she has a metastatic non-small cell lung cancer-adenocarcinoma.  She completed the radiotherapy for the sacral metastasis and the lung primary.  Again, I think we will have to make a change in treatment.  I think that Cyramza/Taxotere would be a good idea for her.  I think she could tolerate this.  I did speak with Dr. Shannon of Radiation Oncology.  He will see if she might be able to have some more radiotherapy to the sacral area.  I just want her to have a better quality of life.  That means having better pain control.  Maybe, the increase in the MS Contin  will help her out a little bit.  We will have to stay on top of this.  Will have to have close follow-up for her.  I think we can probably try to start chemotherapy next week.  This may be dependent upon whether or not she can get any more radiotherapy.  I would like to probably see her back when she has her second cycle of treatment.   Maude JONELLE Crease, MD 12/11/20259:54 AM

## 2024-08-18 ENCOUNTER — Encounter: Payer: Self-pay | Admitting: Hematology & Oncology

## 2024-08-18 MED ORDER — DEXAMETHASONE 4 MG PO TABS: ORAL_TABLET | ORAL | 0 refills | Status: AC

## 2024-08-18 MED ORDER — LIDOCAINE-PRILOCAINE 2.5-2.5 % EX CREA: TOPICAL_CREAM | CUTANEOUS | 3 refills | Status: AC

## 2024-08-18 MED ORDER — PROCHLORPERAZINE MALEATE 10 MG PO TABS: 10.0000 mg | ORAL_TABLET | Freq: Four times a day (QID) | ORAL | 1 refills | Status: AC | PRN

## 2024-08-18 MED ORDER — ONDANSETRON HCL 8 MG PO TABS: 8.0000 mg | ORAL_TABLET | Freq: Three times a day (TID) | ORAL | 1 refills | Status: AC | PRN

## 2024-08-18 NOTE — Addendum Note (Signed)
 Addended by: JEWEL SHANDA SAUNDERS on: 08/18/2024 11:58 AM   Modules accepted: Orders

## 2024-08-18 NOTE — Addendum Note (Signed)
 Addended by: JEWEL SHANDA SAUNDERS on: 08/18/2024 11:48 AM   Modules accepted: Orders

## 2024-08-19 LAB — ERYTHROPOIETIN: Erythropoietin: 33.8 m[IU]/mL — ABNORMAL HIGH (ref 2.6–18.5)

## 2024-08-20 ENCOUNTER — Other Ambulatory Visit: Payer: Self-pay | Admitting: Hematology & Oncology

## 2024-08-20 ENCOUNTER — Other Ambulatory Visit: Payer: Self-pay

## 2024-08-21 ENCOUNTER — Encounter: Payer: Self-pay | Admitting: Hematology & Oncology

## 2024-08-21 ENCOUNTER — Telehealth: Payer: Self-pay | Admitting: *Deleted

## 2024-08-21 NOTE — Telephone Encounter (Signed)
 Reviewed Prechemo Dexamethasone  instructions with patient as well as instructions for Tylenol  and Claritin with Fulphila  injection side effects.  Patient understands instructions.

## 2024-08-21 NOTE — Progress Notes (Unsigned)
 With patient's recent PET showing progression, patient's treatment today will be held. Her regimen will be changed and rescheduled to next week. She will also need consult with RadOnc.   Oncology Nurse Navigator Documentation     08/17/2024    9:30 AM  Oncology Nurse Navigator Flowsheets  Navigator Follow Up Date: 09/13/2024  Navigator Follow Up Reason: Follow-up Appointment;Chemotherapy  Navigator Location [facility]  Navigator Encounter Type Follow-up Appt  Patient Visit Type MedOnc  Treatment Phase Active Tx  Barriers/Navigation Needs No Barriers At This Time  Interventions Psycho-Social Support  Acuity Level 1-No Barriers  Support Groups/Services Friends and Family  Time Spent with Patient 15

## 2024-08-22 ENCOUNTER — Inpatient Hospital Stay

## 2024-08-22 ENCOUNTER — Encounter: Payer: Self-pay | Admitting: Hematology & Oncology

## 2024-08-22 VITALS — BP 118/78 | HR 78 | Temp 98.0°F | Resp 16

## 2024-08-22 DIAGNOSIS — E860 Dehydration: Secondary | ICD-10-CM

## 2024-08-22 DIAGNOSIS — J9601 Acute respiratory failure with hypoxia: Secondary | ICD-10-CM

## 2024-08-22 DIAGNOSIS — N1330 Unspecified hydronephrosis: Secondary | ICD-10-CM

## 2024-08-22 DIAGNOSIS — Z5112 Encounter for antineoplastic immunotherapy: Secondary | ICD-10-CM | POA: Diagnosis not present

## 2024-08-22 DIAGNOSIS — J189 Pneumonia, unspecified organism: Secondary | ICD-10-CM

## 2024-08-22 DIAGNOSIS — D649 Anemia, unspecified: Secondary | ICD-10-CM

## 2024-08-22 DIAGNOSIS — C3491 Malignant neoplasm of unspecified part of right bronchus or lung: Secondary | ICD-10-CM

## 2024-08-22 DIAGNOSIS — R590 Localized enlarged lymph nodes: Secondary | ICD-10-CM

## 2024-08-22 DIAGNOSIS — R55 Syncope and collapse: Secondary | ICD-10-CM

## 2024-08-22 DIAGNOSIS — D734 Cyst of spleen: Secondary | ICD-10-CM

## 2024-08-22 DIAGNOSIS — C187 Malignant neoplasm of sigmoid colon: Secondary | ICD-10-CM

## 2024-08-22 DIAGNOSIS — I82621 Acute embolism and thrombosis of deep veins of right upper extremity: Secondary | ICD-10-CM

## 2024-08-22 DIAGNOSIS — K7689 Other specified diseases of liver: Secondary | ICD-10-CM

## 2024-08-22 DIAGNOSIS — E871 Hypo-osmolality and hyponatremia: Secondary | ICD-10-CM

## 2024-08-22 DIAGNOSIS — N134 Hydroureter: Secondary | ICD-10-CM

## 2024-08-22 DIAGNOSIS — Z85038 Personal history of other malignant neoplasm of large intestine: Secondary | ICD-10-CM

## 2024-08-22 DIAGNOSIS — C786 Secondary malignant neoplasm of retroperitoneum and peritoneum: Secondary | ICD-10-CM

## 2024-08-22 DIAGNOSIS — R7401 Elevation of levels of liver transaminase levels: Secondary | ICD-10-CM

## 2024-08-22 DIAGNOSIS — Z90710 Acquired absence of both cervix and uterus: Secondary | ICD-10-CM

## 2024-08-22 DIAGNOSIS — E785 Hyperlipidemia, unspecified: Secondary | ICD-10-CM

## 2024-08-22 DIAGNOSIS — R1900 Intra-abdominal and pelvic swelling, mass and lump, unspecified site: Secondary | ICD-10-CM

## 2024-08-22 LAB — CMP (CANCER CENTER ONLY)
ALT: 8 U/L (ref 0–44)
AST: 16 U/L (ref 15–41)
Albumin: 4.1 g/dL (ref 3.5–5.0)
Alkaline Phosphatase: 76 U/L (ref 38–126)
Anion gap: 11 (ref 5–15)
BUN: 20 mg/dL (ref 8–23)
CO2: 24 mmol/L (ref 22–32)
Calcium: 9 mg/dL (ref 8.9–10.3)
Chloride: 101 mmol/L (ref 98–111)
Creatinine: 0.71 mg/dL (ref 0.44–1.00)
GFR, Estimated: >60 mL/min (ref >60)
Glucose, Bld: 112 mg/dL — ABNORMAL HIGH (ref 70–99)
Potassium: 4.1 mmol/L (ref 3.5–5.1)
Sodium: 136 mmol/L (ref 135–145)
Total Bilirubin: 0.3 mg/dL (ref 0.0–1.2)
Total Protein: 6.3 g/dL — ABNORMAL LOW (ref 6.5–8.1)

## 2024-08-22 LAB — CBC WITH DIFFERENTIAL (CANCER CENTER ONLY)
Abs Immature Granulocytes: 0.08 K/uL — ABNORMAL HIGH (ref 0.00–0.07)
Basophils Absolute: 0 K/uL (ref 0.0–0.1)
Basophils Relative: 0 %
Eosinophils Absolute: 0 K/uL (ref 0.0–0.5)
Eosinophils Relative: 0 %
HCT: 30.1 % — ABNORMAL LOW (ref 36.0–46.0)
Hemoglobin: 9.8 g/dL — ABNORMAL LOW (ref 12.0–15.0)
Immature Granulocytes: 1 %
Lymphocytes Relative: 7 %
Lymphs Abs: 0.5 K/uL — ABNORMAL LOW (ref 0.7–4.0)
MCH: 30.7 pg (ref 26.0–34.0)
MCHC: 32.6 g/dL (ref 30.0–36.0)
MCV: 94.4 fL (ref 80.0–100.0)
Monocytes Absolute: 0.9 K/uL (ref 0.1–1.0)
Monocytes Relative: 12 %
Neutro Abs: 5.7 K/uL (ref 1.7–7.7)
Neutrophils Relative %: 80 %
Platelet Count: 248 K/uL (ref 150–400)
RBC: 3.19 MIL/uL — ABNORMAL LOW (ref 3.87–5.11)
RDW: 14 % (ref 11.5–15.5)
WBC Count: 7.2 K/uL (ref 4.0–10.5)
nRBC: 0 % (ref 0.0–0.2)

## 2024-08-22 LAB — TOTAL PROTEIN, URINE DIPSTICK: Protein, ur: NEGATIVE mg/dL

## 2024-08-22 LAB — TSH: TSH: 0.41 u[IU]/mL (ref 0.350–4.500)

## 2024-08-22 MED ORDER — DEXAMETHASONE SOD PHOSPHATE PF 10 MG/ML IJ SOLN
10.0000 mg | Freq: Once | INTRAMUSCULAR | Status: AC
  Administered 2024-08-22: 09:00:00 10 mg via INTRAVENOUS

## 2024-08-22 MED ORDER — SODIUM CHLORIDE 0.9 % IV SOLN
10.0000 mg/kg | Freq: Once | INTRAVENOUS | Status: AC
  Administered 2024-08-22: 10:00:00 500 mg via INTRAVENOUS
  Filled 2024-08-22: qty 50

## 2024-08-22 MED ORDER — SODIUM CHLORIDE 0.9 % IV SOLN
60.0000 mg/m2 | Freq: Once | INTRAVENOUS | Status: AC
  Administered 2024-08-22: 11:00:00 80 mg via INTRAVENOUS
  Filled 2024-08-22: qty 8

## 2024-08-22 MED ORDER — DIPHENHYDRAMINE HCL 50 MG/ML IJ SOLN
50.0000 mg | Freq: Once | INTRAMUSCULAR | Status: AC
  Administered 2024-08-22: 09:00:00 50 mg via INTRAVENOUS
  Filled 2024-08-22: qty 1

## 2024-08-22 MED ORDER — ACETAMINOPHEN 325 MG PO TABS
650.0000 mg | ORAL_TABLET | Freq: Once | ORAL | Status: AC
  Administered 2024-08-22: 09:00:00 650 mg via ORAL
  Filled 2024-08-22: qty 2

## 2024-08-22 MED ORDER — SODIUM CHLORIDE 0.9 % IV SOLN: INTRAVENOUS | Status: DC

## 2024-08-22 NOTE — Patient Instructions (Signed)
 [facility] - A DEPT OF [facility]  Discharge Instructions: Thank you for choosing [facility] to provide your oncology and hematology care.   If you have a lab appointment with the Cancer Center, please go directly to the Cancer Center and check in at the registration area.  Wear comfortable clothing and clothing appropriate for easy access to any Portacath or PICC line.   We strive to give you quality time with your provider. You may need to reschedule your appointment if you arrive late (15 or more minutes).  Arriving late affects you and other patients whose appointments are after yours.  Also, if you miss three or more appointments without notifying the office, you may be dismissed from the clinic at the provider's discretion.      For prescription refill requests, have your pharmacy contact our office and allow 72 hours for refills to be completed.    Today you received the following chemotherapy and/or immunotherapy agents Taxotere       To help prevent nausea and vomiting after your treatment, we encourage you to take your nausea medication as directed.  BELOW ARE SYMPTOMS THAT SHOULD BE REPORTED IMMEDIATELY: *FEVER GREATER THAN 100.4 F (38 C) OR HIGHER *CHILLS OR SWEATING *NAUSEA AND VOMITING THAT IS NOT CONTROLLED WITH YOUR NAUSEA MEDICATION *UNUSUAL SHORTNESS OF BREATH *UNUSUAL BRUISING OR BLEEDING *URINARY PROBLEMS (pain or burning when urinating, or frequent urination) *BOWEL PROBLEMS (unusual diarrhea, constipation, pain near the anus) TENDERNESS IN MOUTH AND THROAT WITH OR WITHOUT PRESENCE OF ULCERS (sore throat, sores in mouth, or a toothache) UNUSUAL RASH, SWELLING OR PAIN  UNUSUAL VAGINAL DISCHARGE OR ITCHING   Items with * indicate a potential emergency and should be followed up as soon as possible or go to the Emergency Department if any problems should occur.  Please show the CHEMOTHERAPY ALERT CARD or IMMUNOTHERAPY  ALERT CARD at check-in to the Emergency Department and triage nurse. Should you have questions after your visit or need to cancel or reschedule your appointment, please contact [facility] - A DEPT OF [facility]  [phone] and follow the prompts.  Office hours are 8:00 a.m. to 4:30 p.m. Monday - Friday. Please note that voicemails left after 4:00 p.m. may not be returned until the following business day.  We are closed weekends and major holidays. You have access to a nurse at all times for urgent questions. Please call the main number to the clinic [phone] and follow the prompts.  For any non-urgent questions, you may also contact your provider using MyChart. We now offer e-Visits for anyone 35 and older to request care online for non-urgent symptoms. For details visit [email or web address].   Also download the MyChart app! Go to the app store, search MyChart, open the app, select [facility], and log in with your MyChart username and password.

## 2024-08-22 NOTE — Patient Instructions (Signed)

## 2024-08-22 NOTE — Progress Notes (Signed)
 Pharmacist Chemotherapy Monitoring - Initial Assessment    Anticipated start date: 08/22/24   The following has been reviewed per standard work regarding the patient's treatment regimen: The patient's diagnosis, treatment plan and drug doses, and organ/hematologic function Lab orders and baseline tests specific to treatment regimen  The treatment plan start date, drug sequencing, and pre-medications Prior authorization status  Patient's documented medication list, including drug-drug interaction screen and prescriptions for anti-emetics and supportive care specific to the treatment regimen The drug concentrations, fluid compatibility, administration routes, and timing of the medications to be used The patient's access for treatment and lifetime cumulative dose history, if applicable  The patient's medication allergies and previous infusion related reactions, if applicable   Changes made to treatment plan:  N/A  Follow up needed:  N/A   Norleen JAYSON Sou, RPH, 08/22/2024  8:34 AM

## 2024-08-23 ENCOUNTER — Inpatient Hospital Stay

## 2024-08-23 ENCOUNTER — Inpatient Hospital Stay: Admitting: Hematology & Oncology

## 2024-08-23 LAB — T4: T4, Total: 10 ug/dL (ref 4.5–12.0)

## 2024-08-24 ENCOUNTER — Encounter: Payer: Self-pay | Admitting: *Deleted

## 2024-08-24 ENCOUNTER — Inpatient Hospital Stay

## 2024-08-24 VITALS — BP 120/63 | HR 85 | Temp 98.0°F | Resp 18

## 2024-08-24 DIAGNOSIS — C3491 Malignant neoplasm of unspecified part of right bronchus or lung: Secondary | ICD-10-CM

## 2024-08-24 DIAGNOSIS — Z5112 Encounter for antineoplastic immunotherapy: Secondary | ICD-10-CM | POA: Diagnosis not present

## 2024-08-24 MED ORDER — PEGFILGRASTIM-JMDB 6 MG/0.6ML ~~LOC~~ SOSY
6.0000 mg | PREFILLED_SYRINGE | Freq: Once | SUBCUTANEOUS | Status: AC
  Administered 2024-08-24: 10:00:00 6 mg via SUBCUTANEOUS
  Filled 2024-08-24: qty 0.6

## 2024-08-24 NOTE — Patient Instructions (Signed)

## 2024-08-24 NOTE — Progress Notes (Signed)
 Took 650 mg of Tylenol  and 1 Claritin at 8:00 am.

## 2024-08-30 ENCOUNTER — Encounter: Payer: Self-pay | Admitting: Radiation Oncology

## 2024-08-30 ENCOUNTER — Ambulatory Visit
Admission: RE | Admit: 2024-08-30 | Discharge: 2024-08-30 | Disposition: A | Discharge status: Home / Self Care | Source: Ambulatory Visit | Attending: Radiation Oncology | Admitting: Radiation Oncology

## 2024-08-30 VITALS — BP 111/67 | HR 88 | Temp 97.3°F | Resp 18 | Ht 62.0 in | Wt 100.0 lb

## 2024-08-30 DIAGNOSIS — C3411 Malignant neoplasm of upper lobe, right bronchus or lung: Secondary | ICD-10-CM | POA: Insufficient documentation

## 2024-08-30 DIAGNOSIS — Z79899 Other long term (current) drug therapy: Secondary | ICD-10-CM | POA: Insufficient documentation

## 2024-08-30 DIAGNOSIS — Z923 Personal history of irradiation: Secondary | ICD-10-CM | POA: Diagnosis not present

## 2024-08-30 DIAGNOSIS — Z7952 Long term (current) use of systemic steroids: Secondary | ICD-10-CM | POA: Insufficient documentation

## 2024-08-30 DIAGNOSIS — Z7901 Long term (current) use of anticoagulants: Secondary | ICD-10-CM | POA: Insufficient documentation

## 2024-08-30 DIAGNOSIS — C7951 Secondary malignant neoplasm of bone: Secondary | ICD-10-CM | POA: Insufficient documentation

## 2024-08-30 DIAGNOSIS — Z9221 Personal history of antineoplastic chemotherapy: Secondary | ICD-10-CM | POA: Diagnosis not present

## 2024-08-30 NOTE — Progress Notes (Signed)
 "  Radiation Oncology         [phone] ________________________________  Name: Regina Young MRN: [id number]  Date: 08/30/2024  DOB: 1955/12/31  Re-evaluation note  CC: Dwight Trula SQUIBB, MD  Timmy Maude SAUNDERS, MD    ICD-10-CM   1. 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        Diagnosis:    Stage IV (cT1b, cN0, M1) adenocarcinoma, NSCLC, of the right lung with spinal/sacral metastasis - diagnosed in April 2025   History of stage I (T2N0M0) colonic adenocarcinoma diagnosed in 2022, s/p resection     Cancer Staging  Lung cancer, primary, with metastasis from lung to other site, right [facility]) Staging form: Lung, AJCC V9 - Clinical stage from 01/10/2024: cT1b, cN0(f), cM1 - Signed by Timmy Maude SAUNDERS, MD on 01/10/2024    Interval Since Last Radiation: 6-1/2 months  Current Therapy:        Xgeva  120 mg subcu every 3 months-next dose 09/2024  Carbo/Alimta /Keytruda  -- s/p cycle #6 - start on 03/21/2024 - d/c on 08/17/2024 Taxotere / Cyramza  - start cycle #1 on 08/23/2024 Eliquis  5 mg p.o. twice daily --start 02/01/2024  Narrative: She returns today through the courtesy of Dr. Timmy for consideration for additional radiation therapy.  She completed palliative radiation therapy to the right lung 50 Gray in 10 fractions as well as 30 Gray in 10 fractions to the upper sacral spine region.      She reports having good improvement in her pain and overall performance status with her initial course of sacral radiation therapy.  Approximately 1 month ago the patient began developing having worsening symptoms with pain in this area.  She was under a lot of stress with her sister being involved in a MVA.  She was seen by Dr. Timmy and a PET scan was performed which showed progression of disease in the upper sacral area with tumor extension out of the bone or possibly nodal metastasis in the surrounding area.  Patient did have increase in her MS Contin  to 30 mg twice a day which has been helpful for her  pain.  The patient's systemic treatment was switched as noted above.  Radiation therapy has been consulted for consideration for additional treatment.      On evaluation today the patient denies any bowel or bladder issues.  She tends to be somewhat constipated being on narcotics.  She denies any obvious weakness in her right leg but does complain of some numbness and a the heavy feeling in her right foot.  She also reports having a sore throat.  She did undergo testing and was found to have been the infectious origin for this symptom.  Primary care felt this was likely postnasal drip.     ALLERGIES:  has no known allergies.  Meds: Current Outpatient Medications  Medication Sig Dispense Refill   Calcium  Citrate 250 MG TABS Take 250 mg by mouth in the morning.     Cholecalciferol  (VITAMIN D3) 50 MCG (2000 UT) TABS Take 2,000 Units by mouth daily with breakfast.     cyanocobalamin  (VITAMIN B12) 1000 MCG tablet Take 1,000 mcg by mouth daily.     dexamethasone  (DECADRON ) 4 MG tablet Take 2 tabs by mouth 2 times daily starting day before chemo. Then take 2 tabs daily for 2 days starting day after chemo. Take with food. 30 tablet 0   ELIQUIS  5 MG TABS tablet TAKE 1 TABLET(5 MG) BY MOUTH TWICE DAILY 60 tablet 3   fluticasone  (  FLONASE ) 50 MCG/ACT nasal spray Place 2 sprays into both nostrils daily. 16 g 0   gabapentin  (NEURONTIN ) 300 MG capsule TAKE 1 CAPSULE(300 MG) BY MOUTH THREE TIMES DAILY 90 capsule 1   ipratropium-albuterol  (DUONEB) 0.5-2.5 (3) MG/3ML SOLN Take 3 mLs by nebulization 3 (three) times daily for 7 days, THEN 3 mLs every 6 (six) hours as needed. 360 mL 1   lidocaine -prilocaine  (EMLA ) cream Apply 1 Application topically as needed. 30 g 3   lidocaine -prilocaine  (EMLA ) cream Apply to affected area once 30 g 3   morphine  (MS CONTIN ) 30 MG 12 hr tablet Take 1 tablet (30 mg total) by mouth every 12 (twelve) hours. 60 tablet 0   omeprazole  (PRILOSEC ) 20 MG capsule Take 2 capsules (40 mg  total) by mouth daily before breakfast. 60 capsule 1   ondansetron  (ZOFRAN ) 4 MG tablet Take 1 tablet (4 mg total) by mouth every 6 (six) hours as needed for nausea. 20 tablet 0   ondansetron  (ZOFRAN ) 8 MG tablet Take 1 tablet (8 mg total) by mouth every 8 (eight) hours as needed for nausea or vomiting. 30 tablet 1   oxyCODONE  (OXY IR/ROXICODONE ) 5 MG immediate release tablet Take one to two tablets every six hours as needed for pain 120 tablet 0   prochlorperazine  (COMPAZINE ) 10 MG tablet Take 1 tablet (10 mg total) by mouth every 6 (six) hours as needed for nausea or vomiting. 30 tablet 1   rosuvastatin  (CRESTOR ) 5 MG tablet Take 5 mg by mouth in the morning.     No current facility-administered medications for this encounter.    Physical Findings: The patient is in no acute distress. Patient is alert and oriented.  height is 5' 2 (1.575 m) and weight is 100 lb (45.4 kg). Her temporal temperature is 97.3 F (36.3 C) (abnormal). Her blood pressure is 111/67 and her pulse is 88. Her respiration is 18 and oxygen saturation is 97%. .  Palpation along the neck and supraclavicular area reveals no evidence of adenopathy.  The lungs are clear to auscultation.  The heart has a regular rhythm and rate.  The abdomen is soft and nontender with normal bowel sounds.  On neurological examination motor strength is 5 out of 5 in the proximal and distal muscle groups of the lower extremities.  Palpation along the upper pelvis area does not elicit any areas of point tenderness.  Lab Findings: Lab Results  Component Value Date   WBC 7.2 08/22/2024   HGB 9.8 (L) 08/22/2024   HCT 30.1 (L) 08/22/2024   MCV 94.4 08/22/2024   PLT 248 08/22/2024    Radiographic Findings: NM PET Image Restag (PS) Skull Base To Thigh Result Date: 08/15/2024 EXAM: PET AND CT SKULL BASE TO MID THIGH 08/11/2024 09:49:45 AM TECHNIQUE: RADIOPHARMACEUTICAL: 5.32 mCi F-18 FDG Uptake time 60 minutes. Glucose level 98 mg/dl. Blood pool SUV  1.7. PET imaging was acquired from the base of the skull to the mid thighs. Non-contrast enhanced computed tomography was obtained for attenuation correction and anatomic localization. COMPARISON: 05/26/2024 CLINICAL HISTORY: Non-small cell lung cancer (NSCLC), metastatic, assess treatment response; Status post chemotherapy for metastatic lung cancer. FINDINGS: HEAD AND NECK: No hypermetabolic cervical lymph nodes are identified. Left maxillary sinus mucous retention cyst or polyp of 1.2 cm. CHEST: The left upper lobe hypermetabolic reticular nodular opacities have resolved, consistent with an infectious or inflammatory etiology. The right upper lobe hypermetabolism is decreased, now corresponding to confluent consolidation medially, typical of developing radiation fibrosis. Example at  suv 3.9 on image 17 of series 7. There are no hypermetabolic mediastinal, hilar or axillary lymph nodes. ABDOMEN AND PELVIS: New left adrenal hypermetabolism well-defined nodular mass. SUV 4.0. The right presacral soft tissue fullness and hypermetabolism are again identified. The hypermetabolism is increased, including an SUV 8.3 compared to SUV 6.3 on the prior. The soft tissue fullness is felt to be similar. Marked right-sided hydroureteronephrosis is similar. Overlying marked cortical thinning consistent with chronicity. Hysterectomy. Pelvic floor laxity. Pelvic node dissection. Physiologic activity within the gastrointestinal and genitourinary systems. BONES AND SOFT TISSUE: Diffuse sacral heterogeneity and increased hypermetabolism, with SUV 10.0 today versus SUV 6.3 on the prior. Chronic right sacral pathologic fractures included on image 141/4. IMPRESSION: 1. The left upper lobe pulmonary opacity and hypermetabolism have resolved, consistent with an infectious or inflammatory etiology. The right upper lobe hypermetabolism is decreased, now corresponding to confluent consolidation medially, typical of developing radiation  fibrosis. No residual or recurrent disease identified within the chest. 2. Increase in metabolic diffuse sacral metastasis. Presacral soft tissue fullness is increasingly hypermetabolic, again suggesting direct tumor extension or nodal metastasis. 3. 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 4. New left adrenal hypermetabolism without a dominant nodular mass, most likely physiologic. Recommend attention on follow-up. 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  This does show active disease in the area that was previously treated.  I discussed with the patient and her husband that we potentially could give some additional dose of radiation to this area but would be with potential for nerve damage resulting in possibly chronic pain or weakness.  At this time she seems to be fairly functional with her current pain regimen.  She reports only taking 1 breakthrough medication a day.  She is only received 1 cycle of her new systemic treatment and we do not know the full effect of this new therapy on her active disease.  I discussed with her that she has 2 options, 1 being to proceed with a short course of additional radiation therapy or to hold off and see how her new systemic plan is working.  She feels most comfortable with continuing with systemic therapy alone at this time.  If her pain worsens then she will reconsider radiation therapy.  Plan: She will continue on systemic therapy consisting of Taxotere  and Cyramza .  Anticipate that she will proceed with additional PET scan after she is completed several cycles of this new regimen.  If her pain worsens over the next several  weeks then we will need to reconsider palliative radiation therapy.  ____________________________________ Lynwood Nasuti, MD     "

## 2024-08-30 NOTE — Progress Notes (Addendum)
"   Histology and Location of Primary Cancer:    Sites of Visceral and Bony Metastatic Disease: sacral   Location(s) of Symptomatic Metastases:   Past/Anticipated chemotherapy by medical oncology, if any:    Pain on a scale of 0-10 is: 7, pain is constant to right foot and leg. Patient is currently taking morphine  30mg  q 12 hour.    If Spine Met(s), symptoms, if any, include: Bowel/Bladder retention or incontinence (please describe): No Numbness or weakness in extremities (please describe): Yes, numbness and tingling in right leg.  Current Decadron  regimen, if applicable: No  Ambulatory status? Walker? Wheelchair?: Ambulatory  SAFETY ISSUES: Prior radiation? yes Pacemaker/ICD? no Possible current pregnancy? no Is the patient on methotrexate? no  Current Complaints / other details:    BP 111/67 (BP Location: Left Arm, Patient Position: Sitting)   Pulse 88   Temp (!) 97.3 F (36.3 C) (Temporal)   Resp 18   Ht 5' 2 (1.575 m)   Wt 100 lb (45.4 kg)   SpO2 97%   BMI 18.29 kg/m      "

## 2024-09-13 ENCOUNTER — Inpatient Hospital Stay: Attending: Hematology & Oncology

## 2024-09-13 ENCOUNTER — Inpatient Hospital Stay

## 2024-09-13 ENCOUNTER — Encounter: Payer: Self-pay | Admitting: Hematology & Oncology

## 2024-09-13 ENCOUNTER — Inpatient Hospital Stay: Admitting: Hematology & Oncology

## 2024-09-13 ENCOUNTER — Encounter: Payer: Self-pay | Admitting: *Deleted

## 2024-09-13 VITALS — BP 110/61 | HR 90 | Temp 98.0°F | Resp 18 | Ht 62.0 in | Wt 103.0 lb

## 2024-09-13 DIAGNOSIS — C7951 Secondary malignant neoplasm of bone: Secondary | ICD-10-CM | POA: Insufficient documentation

## 2024-09-13 DIAGNOSIS — C3491 Malignant neoplasm of unspecified part of right bronchus or lung: Secondary | ICD-10-CM

## 2024-09-13 DIAGNOSIS — Z79620 Long term (current) use of immunosuppressive biologic: Secondary | ICD-10-CM | POA: Diagnosis not present

## 2024-09-13 DIAGNOSIS — Z87891 Personal history of nicotine dependence: Secondary | ICD-10-CM | POA: Insufficient documentation

## 2024-09-13 DIAGNOSIS — R52 Pain, unspecified: Secondary | ICD-10-CM | POA: Insufficient documentation

## 2024-09-13 DIAGNOSIS — Z5112 Encounter for antineoplastic immunotherapy: Secondary | ICD-10-CM | POA: Insufficient documentation

## 2024-09-13 DIAGNOSIS — Z5111 Encounter for antineoplastic chemotherapy: Secondary | ICD-10-CM | POA: Diagnosis present

## 2024-09-13 DIAGNOSIS — C3490 Malignant neoplasm of unspecified part of unspecified bronchus or lung: Secondary | ICD-10-CM

## 2024-09-13 DIAGNOSIS — Z5189 Encounter for other specified aftercare: Secondary | ICD-10-CM | POA: Diagnosis not present

## 2024-09-13 LAB — CBC WITH DIFFERENTIAL (CANCER CENTER ONLY)
Abs Immature Granulocytes: 0.06 K/uL (ref 0.00–0.07)
Basophils Absolute: 0 K/uL (ref 0.0–0.1)
Basophils Relative: 0 %
Eosinophils Absolute: 0 K/uL (ref 0.0–0.5)
Eosinophils Relative: 0 %
HCT: 30.6 % — ABNORMAL LOW (ref 36.0–46.0)
Hemoglobin: 9.4 g/dL — ABNORMAL LOW (ref 12.0–15.0)
Immature Granulocytes: 1 %
Lymphocytes Relative: 11 %
Lymphs Abs: 0.6 K/uL — ABNORMAL LOW (ref 0.7–4.0)
MCH: 29.9 pg (ref 26.0–34.0)
MCHC: 30.7 g/dL (ref 30.0–36.0)
MCV: 97.5 fL (ref 80.0–100.0)
Monocytes Absolute: 0.5 K/uL (ref 0.1–1.0)
Monocytes Relative: 9 %
Neutro Abs: 4.4 K/uL (ref 1.7–7.7)
Neutrophils Relative %: 79 %
Platelet Count: 273 K/uL (ref 150–400)
RBC: 3.14 MIL/uL — ABNORMAL LOW (ref 3.87–5.11)
RDW: 16.4 % — ABNORMAL HIGH (ref 11.5–15.5)
WBC Count: 5.5 K/uL (ref 4.0–10.5)
nRBC: 0 % (ref 0.0–0.2)

## 2024-09-13 LAB — CMP (CANCER CENTER ONLY)
ALT: 17 U/L (ref 0–44)
AST: 18 U/L (ref 15–41)
Albumin: 4.2 g/dL (ref 3.5–5.0)
Alkaline Phosphatase: 64 U/L (ref 38–126)
Anion gap: 13 (ref 5–15)
BUN: 23 mg/dL (ref 8–23)
CO2: 24 mmol/L (ref 22–32)
Calcium: 9.5 mg/dL (ref 8.9–10.3)
Chloride: 104 mmol/L (ref 98–111)
Creatinine: 0.74 mg/dL (ref 0.44–1.00)
GFR, Estimated: >60 mL/min
Glucose, Bld: 117 mg/dL — ABNORMAL HIGH (ref 70–99)
Potassium: 4.5 mmol/L (ref 3.5–5.1)
Sodium: 141 mmol/L (ref 135–145)
Total Bilirubin: 0.3 mg/dL (ref 0.0–1.2)
Total Protein: 6.8 g/dL (ref 6.5–8.1)

## 2024-09-13 LAB — SAMPLE TO BLOOD BANK

## 2024-09-13 LAB — PREALBUMIN: Prealbumin: 21 mg/dL (ref 18–38)

## 2024-09-13 MED ORDER — DEXAMETHASONE SOD PHOSPHATE PF 10 MG/ML IJ SOLN
10.0000 mg | Freq: Once | INTRAMUSCULAR | Status: AC
  Administered 2024-09-13: 10 mg via INTRAVENOUS
  Filled 2024-09-13: qty 1

## 2024-09-13 MED ORDER — SODIUM CHLORIDE 0.9 % IV SOLN
10.0000 mg/kg | Freq: Once | INTRAVENOUS | Status: AC
  Administered 2024-09-13: 500 mg via INTRAVENOUS
  Filled 2024-09-13: qty 50

## 2024-09-13 MED ORDER — SODIUM CHLORIDE 0.9 % IV SOLN: INTRAVENOUS | Status: DC

## 2024-09-13 MED ORDER — ACETAMINOPHEN 325 MG PO TABS
650.0000 mg | ORAL_TABLET | Freq: Once | ORAL | Status: AC
  Administered 2024-09-13: 650 mg via ORAL
  Filled 2024-09-13: qty 2

## 2024-09-13 MED ORDER — DIPHENHYDRAMINE HCL 50 MG/ML IJ SOLN
50.0000 mg | Freq: Once | INTRAMUSCULAR | Status: AC
  Administered 2024-09-13: 50 mg via INTRAVENOUS
  Filled 2024-09-13: qty 1

## 2024-09-13 MED ORDER — SODIUM CHLORIDE 0.9 % IV SOLN
60.0000 mg/m2 | Freq: Once | INTRAVENOUS | Status: AC
  Administered 2024-09-13: 80 mg via INTRAVENOUS
  Filled 2024-09-13: qty 8

## 2024-09-13 NOTE — Progress Notes (Signed)
 Patient has good tolerance of cycle one. She will proceed with cycle two today. Scans will be needed after her third cycle.   Oncology Nurse Navigator Documentation     09/13/2024   10:30 AM  Oncology Nurse Navigator Flowsheets  Navigator Follow Up Date: 10/04/2024  Navigator Follow Up Reason: Follow-up Appointment;Chemotherapy  Navigator Location [facility]  Navigator Encounter Type Treatment  Patient Visit Type MedOnc  Treatment Phase Active Tx  Barriers/Navigation Needs No Barriers At This Time  Interventions Psycho-Social Support  Acuity Level 1-No Barriers  Support Groups/Services Friends and Family  Time Spent with Patient 15

## 2024-09-13 NOTE — Progress Notes (Signed)
 " Hematology and Oncology Follow Up Visit  Regina Young [id number] [birth year] 69 y.o. 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  She received 5000 rad to the lung and 3000 rad to the spine. Xgeva  120 mg subcu every 3 months-next dose 12/2024  Carbo/Alimta /Keytruda  -- s/p cycle #6 - start on 03/21/2024 - d/c on 08/17/2024 Taxotere / Cyramza  - s/p cycle #1- start  on 08/23/2024 Eliquis  5 mg p.o. twice daily --start 02/01/2024     Interim History:  Regina Young is in for follow-up.  I think that she tolerated her first cycle of chemotherapy fairly well with the Taxotere /Cyramza .  Her problem is that she does will not take the pain medication like she needs to.  She is taking the MS Contin .  She just is not going to take the oxycodone .  I am not sure why she will not.  I told her that she can take the oxycodone  3 or 4 times a day if need be.  Her husband wants her to take the oxycodone .  I am not sure why she is reluctant to do this.    I think that she will always have pain in the right leg.  This is a radicular pain..  She has seen Dr. Shannon of Radiation Oncology.  He says he can probably do a little bit more radiation if need be.  Hopefully, we will see an improvement with his new chemotherapy protocol.  Otherwise, she does not lose her hair.  I hate this for her.  She has had no cough or shortness of breath.  She has had no bleeding.  She has had no change in bowel or bladder habits.  She has had no leg swelling.  Overall, I will say that her performance status is probably ECOG 1.    Medications:  Current Outpatient Medications:    Calcium  Citrate 250 MG TABS, Take 250 mg by mouth in the morning., Disp: ,  Rfl:    Cholecalciferol  (VITAMIN D3) 50 MCG (2000 UT) TABS, Take 2,000 Units by mouth daily with breakfast., Disp: , Rfl:    cyanocobalamin  (VITAMIN B12) 1000 MCG tablet, Take 1,000 mcg by mouth daily., Disp: , Rfl:    dexamethasone  (DECADRON ) 4 MG tablet, Take 2 tabs by mouth 2 times daily starting day before chemo. Then take 2 tabs daily for 2 days starting day after chemo. Take with food., Disp: 30 tablet, Rfl: 0   ELIQUIS  5 MG TABS tablet, TAKE 1 TABLET(5 MG) BY MOUTH TWICE DAILY, Disp: 60 tablet, Rfl: 3   gabapentin  (NEURONTIN ) 300 MG capsule, TAKE 1 CAPSULE(300 MG) BY MOUTH THREE TIMES DAILY, Disp: 90 capsule, Rfl: 1   ipratropium-albuterol  (DUONEB) 0.5-2.5 (3) MG/3ML SOLN, Take 3 mLs by nebulization 3 (three) times daily for 7 days, THEN 3 mLs every 6 (six) hours as needed., Disp: 360 mL, Rfl: 1   lidocaine -prilocaine  (EMLA ) cream, Apply to affected area once, Disp: 30 g, Rfl: 3   morphine  (MS CONTIN ) 30 MG 12 hr tablet, Take 1 tablet (30 mg total) by mouth every 12 (twelve) hours., Disp: 60 tablet, Rfl: 0   omeprazole  (PRILOSEC ) 20 MG capsule, Take 2 capsules (40 mg total) by mouth daily before breakfast., Disp: 60 capsule, Rfl: 1   ondansetron  (  ZOFRAN ) 4 MG tablet, Take 1 tablet (4 mg total) by mouth every 6 (six) hours as needed for nausea., Disp: 20 tablet, Rfl: 0   ondansetron  (ZOFRAN ) 8 MG tablet, Take 1 tablet (8 mg total) by mouth every 8 (eight) hours as needed for nausea or vomiting., Disp: 30 tablet, Rfl: 1   oxyCODONE  (OXY IR/ROXICODONE ) 5 MG immediate release tablet, Take one to two tablets every six hours as needed for pain, Disp: 120 tablet, Rfl: 0   prochlorperazine  (COMPAZINE ) 10 MG tablet, Take 1 tablet (10 mg total) by mouth every 6 (six) hours as needed for nausea or vomiting., Disp: 30 tablet, Rfl: 1   rosuvastatin  (CRESTOR ) 5 MG tablet, Take 5 mg by mouth in the morning., Disp: , Rfl:    fluticasone  (FLONASE ) 50 MCG/ACT nasal spray, Place 2 sprays into both nostrils daily.  (Patient not taking: Reported on 09/13/2024), Disp: 16 g, Rfl: 0   lidocaine -prilocaine  (EMLA ) cream, Apply 1 Application topically as needed., Disp: 30 g, Rfl: 3  Allergies: No Known Allergies  Past Medical History, Surgical history, Social history, and Family History were reviewed and updated.  Review of Systems: Review of Systems  Constitutional: Negative.   HENT:  Negative.    Eyes: Negative.   Respiratory: Negative.    Cardiovascular: Negative.   Gastrointestinal:  Positive for abdominal pain.  Endocrine: Negative.   Genitourinary: Negative.    Musculoskeletal:  Positive for back pain.  Neurological: Negative.   Hematological: Negative.   Psychiatric/Behavioral: Negative.      Physical Exam:   Vital signs show temperature of 98.  Pulse 90.  Blood pressure 110/61.  Weight is 103 pounds.     Wt Readings from Last 3 Encounters:  09/13/24 103 lb (46.7 kg)  08/30/24 100 lb (45.4 kg)  08/17/24 105 lb 12.8 oz (48 kg)    Physical Exam Vitals reviewed.  HENT:     Head: Normocephalic and atraumatic.  Eyes:     Pupils: Pupils are equal, round, and reactive to light.  Cardiovascular:     Rate and Rhythm: Normal rate and regular rhythm.     Heart sounds: Normal heart sounds.  Pulmonary:     Effort: Pulmonary effort is normal.     Breath sounds: Normal breath sounds.  Abdominal:     General: Bowel sounds are normal.     Palpations: Abdomen is soft.  Musculoskeletal:        General: No tenderness or deformity. Normal range of motion.     Cervical back: Normal range of motion.  Lymphadenopathy:     Cervical: No cervical adenopathy.  Skin:    General: Skin is warm and dry.     Findings: No erythema or rash.  Neurological:     Mental Status: She is alert and oriented to person, place, and time.  Psychiatric:        Behavior: Behavior normal.        Thought Content: Thought content normal.        Judgment: Judgment normal.      Lab Results  Component Value Date    WBC 5.5 09/13/2024   HGB 9.4 (L) 09/13/2024   HCT 30.6 (L) 09/13/2024   MCV 97.5 09/13/2024   PLT 273 09/13/2024     Chemistry      Component Value Date/Time   NA 141 09/13/2024 0920   K 4.5 09/13/2024 0920   CL 104 09/13/2024 0920   CO2 24 09/13/2024 0920   BUN 23 09/13/2024  0920   CREATININE 0.74 09/13/2024 0920      Component Value Date/Time   CALCIUM  9.5 09/13/2024 0920   ALKPHOS 64 09/13/2024 0920   AST 18 09/13/2024 0920   ALT 17 09/13/2024 0920   BILITOT 0.3 09/13/2024 0920      Impression and Plan: Ms. Batterman is a very charming 69 year old white female.  She has a past history of an early stage colon cancer.  However, now it looks like she has  metastatic non-small cell lung cancer-adenocarcinoma.  She completed the radiotherapy for the sacral metastasis and the lung primary.  Hopefully, the Taxotere /Cyramza  will help.  We will give her cycle #2 today.  After the third cycle, we will see about another PET scan.  Again if we do need radiotherapy, I do not see a problem with this.  Again I just want her to have quality of life.  Again, we try to convince her that she needs to the short acting oxycodone .  Again she can take this 4 or 5 times a day if necessary.  Hopefully she will do this.  She will get her Xgeva  today.  We will plan to get her back in another 3 weeks.   Maude JONELLE Crease, MD 1/7/202610:20 AM "

## 2024-09-13 NOTE — Patient Instructions (Signed)
 [facility] - A DEPT OF [facility]  Discharge Instructions: Thank you for choosing [facility] to provide your oncology and hematology care.   If you have a lab appointment with the Cancer Center, please go directly to the Cancer Center and check in at the registration area.  Wear comfortable clothing and clothing appropriate for easy access to any Portacath or PICC line.   We strive to give you quality time with your provider. You may need to reschedule your appointment if you arrive late (15 or more minutes).  Arriving late affects you and other patients whose appointments are after yours.  Also, if you miss three or more appointments without notifying the office, you may be dismissed from the clinic at the providers discretion.      For prescription refill requests, have your pharmacy contact our office and allow 72 hours for refills to be completed.    Today you received the following chemotherapy and/or immunotherapy agents Cyramza  and taxotere      To help prevent nausea and vomiting after your treatment, we encourage you to take your nausea medication as directed.  BELOW ARE SYMPTOMS THAT SHOULD BE REPORTED IMMEDIATELY: *FEVER GREATER THAN 100.4 F (38 C) OR HIGHER *CHILLS OR SWEATING *NAUSEA AND VOMITING THAT IS NOT CONTROLLED WITH YOUR NAUSEA MEDICATION *UNUSUAL SHORTNESS OF BREATH *UNUSUAL BRUISING OR BLEEDING *URINARY PROBLEMS (pain or burning when urinating, or frequent urination) *BOWEL PROBLEMS (unusual diarrhea, constipation, pain near the anus) TENDERNESS IN MOUTH AND THROAT WITH OR WITHOUT PRESENCE OF ULCERS (sore throat, sores in mouth, or a toothache) UNUSUAL RASH, SWELLING OR PAIN  UNUSUAL VAGINAL DISCHARGE OR ITCHING   Items with * indicate a potential emergency and should be followed up as soon as possible or go to the Emergency Department if any problems should occur.  Please show the CHEMOTHERAPY ALERT CARD or  IMMUNOTHERAPY ALERT CARD at check-in to the Emergency Department and triage nurse. Should you have questions after your visit or need to cancel or reschedule your appointment, please contact [facility] - A DEPT OF [facility]  [phone] and follow the prompts.  Office hours are 8:00 a.m. to 4:30 p.m. Monday - Friday. Please note that voicemails left after 4:00 p.m. may not be returned until the following business day.  We are closed weekends and major holidays. You have access to a nurse at all times for urgent questions. Please call the main number to the clinic [phone] and follow the prompts.  For any non-urgent questions, you may also contact your provider using MyChart. We now offer e-Visits for anyone 67 and older to request care online for non-urgent symptoms. For details visit [email or web address].   Also download the MyChart app! Go to the app store, search MyChart, open the app, select [facility], and log in with your MyChart username and password.

## 2024-09-13 NOTE — Patient Instructions (Signed)

## 2024-09-14 ENCOUNTER — Other Ambulatory Visit: Payer: Self-pay

## 2024-09-15 ENCOUNTER — Inpatient Hospital Stay

## 2024-09-15 VITALS — BP 131/89 | HR 75 | Temp 98.2°F | Resp 16 | Wt 105.0 lb

## 2024-09-15 DIAGNOSIS — Z5112 Encounter for antineoplastic immunotherapy: Secondary | ICD-10-CM | POA: Diagnosis not present

## 2024-09-15 DIAGNOSIS — C3491 Malignant neoplasm of unspecified part of right bronchus or lung: Secondary | ICD-10-CM

## 2024-09-15 DIAGNOSIS — C786 Secondary malignant neoplasm of retroperitoneum and peritoneum: Secondary | ICD-10-CM

## 2024-09-15 MED ORDER — DENOSUMAB 120 MG/1.7ML ~~LOC~~ SOLN
120.0000 mg | Freq: Once | SUBCUTANEOUS | Status: AC
  Administered 2024-09-15: 120 mg via SUBCUTANEOUS
  Filled 2024-09-15: qty 1.7

## 2024-09-15 MED ORDER — PEGFILGRASTIM-JMDB 6 MG/0.6ML ~~LOC~~ SOSY
6.0000 mg | PREFILLED_SYRINGE | Freq: Once | SUBCUTANEOUS | Status: AC
  Administered 2024-09-15: 6 mg via SUBCUTANEOUS
  Filled 2024-09-15: qty 0.6

## 2024-09-15 NOTE — Patient Instructions (Signed)

## 2024-09-18 ENCOUNTER — Telehealth: Payer: Self-pay | Admitting: *Deleted

## 2024-09-18 NOTE — Telephone Encounter (Signed)
 Patient called to report intense pain and aching 2 days after her Neulasta  injection on Friday.  Reviewed patients medications to help with the pain, Oxycodone , MS contin  and Tylenol .  Patient is staggering these medications to manage pain.  Reminded patient to add Claritin 2 days before treatment and continue for a week to help with this side effect from the Neulasta .  Patient and her husband both in agreement with above.

## 2024-09-25 ENCOUNTER — Other Ambulatory Visit: Payer: Self-pay | Admitting: *Deleted

## 2024-09-25 ENCOUNTER — Telehealth: Payer: Self-pay | Admitting: *Deleted

## 2024-09-25 MED ORDER — GABAPENTIN 300 MG PO CAPS: 300.0000 mg | ORAL_CAPSULE | Freq: Four times a day (QID) | ORAL | 2 refills | Status: AC

## 2024-09-25 NOTE — Telephone Encounter (Signed)
 Returned call to pt, discussed with pt and husband per MD- Continue to take Oxy 2 tablets q6hrs, take Morphine  1 tablet q12hrs, and a Rx for gabapentin  will be sent to your Pharmacy for you to take 1 tablet(300mg ) four times a day.  Pt and husband verbalized understanding, no further concerns.

## 2024-09-27 ENCOUNTER — Other Ambulatory Visit: Payer: Self-pay

## 2024-09-27 DIAGNOSIS — C786 Secondary malignant neoplasm of retroperitoneum and peritoneum: Secondary | ICD-10-CM

## 2024-09-27 DIAGNOSIS — C3491 Malignant neoplasm of unspecified part of right bronchus or lung: Secondary | ICD-10-CM

## 2024-09-27 MED ORDER — MORPHINE SULFATE ER 30 MG PO TBCR: 30.0000 mg | EXTENDED_RELEASE_TABLET | Freq: Two times a day (BID) | ORAL | 0 refills | Status: AC

## 2024-09-27 NOTE — Telephone Encounter (Signed)
 Pt called in requesting a refill of her Morphine  30 mg to be sent to [facility] on [address].

## 2024-09-28 ENCOUNTER — Inpatient Hospital Stay

## 2024-09-28 ENCOUNTER — Other Ambulatory Visit: Payer: Self-pay

## 2024-09-28 ENCOUNTER — Telehealth: Payer: Self-pay

## 2024-09-28 ENCOUNTER — Ambulatory Visit: Payer: Self-pay | Admitting: Hematology & Oncology

## 2024-09-28 VITALS — BP 116/70 | HR 92 | Temp 97.8°F | Resp 20

## 2024-09-28 DIAGNOSIS — C786 Secondary malignant neoplasm of retroperitoneum and peritoneum: Secondary | ICD-10-CM

## 2024-09-28 DIAGNOSIS — Z5112 Encounter for antineoplastic immunotherapy: Secondary | ICD-10-CM | POA: Diagnosis not present

## 2024-09-28 DIAGNOSIS — C3491 Malignant neoplasm of unspecified part of right bronchus or lung: Secondary | ICD-10-CM

## 2024-09-28 LAB — URINALYSIS, COMPLETE (UACMP) WITH MICROSCOPIC
Bacteria, UA: NONE SEEN
Bilirubin Urine: NEGATIVE
Glucose, UA: NEGATIVE mg/dL
Ketones, ur: NEGATIVE mg/dL
Leukocytes,Ua: NEGATIVE
Nitrite: NEGATIVE
Protein, ur: NEGATIVE mg/dL
Specific Gravity, Urine: 1.02 (ref 1.005–1.030)
pH: 5.5 (ref 5.0–8.0)

## 2024-09-28 LAB — CBC WITH DIFFERENTIAL (CANCER CENTER ONLY)
Abs Immature Granulocytes: 0.05 K/uL (ref 0.00–0.07)
Basophils Absolute: 0 K/uL (ref 0.0–0.1)
Basophils Relative: 0 %
Eosinophils Absolute: 0 K/uL (ref 0.0–0.5)
Eosinophils Relative: 0 %
HCT: 29.2 % — ABNORMAL LOW (ref 36.0–46.0)
Hemoglobin: 8.8 g/dL — ABNORMAL LOW (ref 12.0–15.0)
Immature Granulocytes: 1 %
Lymphocytes Relative: 15 %
Lymphs Abs: 0.8 K/uL (ref 0.7–4.0)
MCH: 30.1 pg (ref 26.0–34.0)
MCHC: 30.1 g/dL (ref 30.0–36.0)
MCV: 100 fL (ref 80.0–100.0)
Monocytes Absolute: 0.4 K/uL (ref 0.1–1.0)
Monocytes Relative: 7 %
Neutro Abs: 4.1 K/uL (ref 1.7–7.7)
Neutrophils Relative %: 77 %
Platelet Count: 239 K/uL (ref 150–400)
RBC: 2.92 MIL/uL — ABNORMAL LOW (ref 3.87–5.11)
RDW: 17 % — ABNORMAL HIGH (ref 11.5–15.5)
WBC Count: 5.4 K/uL (ref 4.0–10.5)
nRBC: 0 % (ref 0.0–0.2)

## 2024-09-28 LAB — CMP (CANCER CENTER ONLY)
ALT: 19 U/L (ref 0–44)
AST: 17 U/L (ref 15–41)
Albumin: 3.6 g/dL (ref 3.5–5.0)
Alkaline Phosphatase: 107 U/L (ref 38–126)
Anion gap: 9 (ref 5–15)
BUN: 12 mg/dL (ref 8–23)
CO2: 26 mmol/L (ref 22–32)
Calcium: 8.7 mg/dL — ABNORMAL LOW (ref 8.9–10.3)
Chloride: 105 mmol/L (ref 98–111)
Creatinine: 0.66 mg/dL (ref 0.44–1.00)
GFR, Estimated: >60 mL/min
Glucose, Bld: 99 mg/dL (ref 70–99)
Potassium: 4.6 mmol/L (ref 3.5–5.1)
Sodium: 140 mmol/L (ref 135–145)
Total Bilirubin: 0.3 mg/dL (ref 0.0–1.2)
Total Protein: 6.1 g/dL — ABNORMAL LOW (ref 6.5–8.1)

## 2024-09-28 MED ORDER — SODIUM CHLORIDE 0.9 % IV SOLN: Freq: Once | INTRAVENOUS | Status: AC

## 2024-09-28 MED ORDER — SODIUM CHLORIDE 0.9 % IV SOLN
30.0000 mg | Freq: Once | INTRAVENOUS | Status: AC
  Administered 2024-09-28: 30 mg via INTRAVENOUS
  Filled 2024-09-28: qty 3

## 2024-09-28 MED ORDER — SODIUM CHLORIDE 0.9 % IV SOLN: INTRAVENOUS | Status: DC

## 2024-09-28 MED ORDER — KETOROLAC TROMETHAMINE 30 MG/ML IJ SOLN: 30.0000 mg | Freq: Once | INTRAMUSCULAR | Status: DC

## 2024-09-28 MED ORDER — SODIUM CHLORIDE 0.9 % IV SOLN: 30.0000 mg | Freq: Once | INTRAVENOUS | Status: DC

## 2024-09-28 MED ORDER — KETOROLAC TROMETHAMINE 15 MG/ML IJ SOLN
30.0000 mg | Freq: Once | INTRAMUSCULAR | Status: AC
  Administered 2024-09-28: 30 mg via INTRAVENOUS
  Filled 2024-09-28: qty 2

## 2024-09-28 NOTE — Telephone Encounter (Signed)
 Received voicemail from pt's husband Regina Young stating that pt is in severe pain. Neither of them slept well last night due to this. They have increased her Gabapentin  from 300 mg from every 8 hours to 6 hours and she is taking 2 Oxycodone  5 mg every 6 hours. Pain meds only last 3 hours at at time. Husband would like to know what else they can do. Per Dr Timmy: he plans to speak with Dr Shannon regarding this pt. He also recommends her coming to the office today for IV steroid, IV pain meds and IV fluids. Called pt and informed her of above. Pt also stated that she is having urgency when needing to urinate or have a BM. She is unsure if this is related to pain med use and not eating. Per Dr Timmy ok to check a urinalysis. Pt advised and plans to come in around 12. Orders in.

## 2024-10-04 ENCOUNTER — Encounter: Payer: Self-pay | Admitting: *Deleted

## 2024-10-04 ENCOUNTER — Inpatient Hospital Stay

## 2024-10-04 ENCOUNTER — Encounter: Payer: Self-pay | Admitting: Hematology & Oncology

## 2024-10-04 ENCOUNTER — Inpatient Hospital Stay: Admitting: Hematology & Oncology

## 2024-10-04 VITALS — BP 136/83 | HR 69 | Temp 98.4°F | Resp 19 | Ht 62.0 in | Wt 102.1 lb

## 2024-10-04 VITALS — BP 148/84 | HR 68 | Resp 19

## 2024-10-04 DIAGNOSIS — C187 Malignant neoplasm of sigmoid colon: Secondary | ICD-10-CM | POA: Diagnosis not present

## 2024-10-04 DIAGNOSIS — C3491 Malignant neoplasm of unspecified part of right bronchus or lung: Secondary | ICD-10-CM

## 2024-10-04 DIAGNOSIS — C786 Secondary malignant neoplasm of retroperitoneum and peritoneum: Secondary | ICD-10-CM

## 2024-10-04 DIAGNOSIS — Z5112 Encounter for antineoplastic immunotherapy: Secondary | ICD-10-CM | POA: Diagnosis not present

## 2024-10-04 DIAGNOSIS — C3490 Malignant neoplasm of unspecified part of unspecified bronchus or lung: Secondary | ICD-10-CM | POA: Diagnosis not present

## 2024-10-04 LAB — CMP (CANCER CENTER ONLY)
ALT: 42 U/L (ref 0–44)
AST: 23 U/L (ref 15–41)
Albumin: 3.7 g/dL (ref 3.5–5.0)
Alkaline Phosphatase: 68 U/L (ref 38–126)
Anion gap: 9 (ref 5–15)
BUN: 28 mg/dL — ABNORMAL HIGH (ref 8–23)
CO2: 27 mmol/L (ref 22–32)
Calcium: 8.4 mg/dL — ABNORMAL LOW (ref 8.9–10.3)
Chloride: 101 mmol/L (ref 98–111)
Creatinine: 0.68 mg/dL (ref 0.44–1.00)
GFR, Estimated: >60 mL/min
Glucose, Bld: 94 mg/dL (ref 70–99)
Potassium: 4.4 mmol/L (ref 3.5–5.1)
Sodium: 136 mmol/L (ref 135–145)
Total Bilirubin: 0.3 mg/dL (ref 0.0–1.2)
Total Protein: 5.8 g/dL — ABNORMAL LOW (ref 6.5–8.1)

## 2024-10-04 LAB — CBC WITH DIFFERENTIAL (CANCER CENTER ONLY)
Abs Immature Granulocytes: 0.53 10*3/uL — ABNORMAL HIGH (ref 0.00–0.07)
Basophils Absolute: 0.1 10*3/uL (ref 0.0–0.1)
Basophils Relative: 1 %
Eosinophils Absolute: 0 10*3/uL (ref 0.0–0.5)
Eosinophils Relative: 0 %
HCT: 32.8 % — ABNORMAL LOW (ref 36.0–46.0)
Hemoglobin: 10.3 g/dL — ABNORMAL LOW (ref 12.0–15.0)
Immature Granulocytes: 7 %
Lymphocytes Relative: 6 %
Lymphs Abs: 0.4 10*3/uL — ABNORMAL LOW (ref 0.7–4.0)
MCH: 30.8 pg (ref 26.0–34.0)
MCHC: 31.4 g/dL (ref 30.0–36.0)
MCV: 98.2 fL (ref 80.0–100.0)
Monocytes Absolute: 0.6 10*3/uL (ref 0.1–1.0)
Monocytes Relative: 8 %
Neutro Abs: 5.6 10*3/uL (ref 1.7–7.7)
Neutrophils Relative %: 78 %
Platelet Count: 317 10*3/uL (ref 150–400)
RBC: 3.34 MIL/uL — ABNORMAL LOW (ref 3.87–5.11)
RDW: 18.1 % — ABNORMAL HIGH (ref 11.5–15.5)
WBC Count: 7.2 10*3/uL (ref 4.0–10.5)
nRBC: 0.3 % — ABNORMAL HIGH (ref 0.0–0.2)

## 2024-10-04 LAB — TOTAL PROTEIN, URINE DIPSTICK: Protein, ur: NEGATIVE mg/dL

## 2024-10-04 LAB — LACTATE DEHYDROGENASE: LDH: 209 U/L (ref 105–235)

## 2024-10-04 LAB — TSH: TSH: 0.699 u[IU]/mL (ref 0.350–4.500)

## 2024-10-04 LAB — SAMPLE TO BLOOD BANK

## 2024-10-04 MED ORDER — DIPHENHYDRAMINE HCL 50 MG/ML IJ SOLN
50.0000 mg | Freq: Once | INTRAMUSCULAR | Status: AC
  Administered 2024-10-04: 25 mg via INTRAVENOUS
  Filled 2024-10-04: qty 1

## 2024-10-04 MED ORDER — ACETAMINOPHEN 325 MG PO TABS
650.0000 mg | ORAL_TABLET | Freq: Once | ORAL | Status: AC
  Administered 2024-10-04: 650 mg via ORAL
  Filled 2024-10-04: qty 2

## 2024-10-04 MED ORDER — KETOROLAC TROMETHAMINE 15 MG/ML IJ SOLN
30.0000 mg | Freq: Once | INTRAMUSCULAR | Status: AC
  Administered 2024-10-04: 30 mg via INTRAVENOUS
  Filled 2024-10-04 (×2): qty 2

## 2024-10-04 MED ORDER — SODIUM CHLORIDE 0.9 % IV SOLN: 10.0000 mg/kg | Freq: Once | INTRAVENOUS | Status: DC

## 2024-10-04 MED ORDER — SODIUM CHLORIDE 0.9 % IV SOLN
60.0000 mg/m2 | Freq: Once | INTRAVENOUS | Status: AC
  Administered 2024-10-04: 80 mg via INTRAVENOUS
  Filled 2024-10-04: qty 8

## 2024-10-04 MED ORDER — SODIUM CHLORIDE 0.9 % IV SOLN: 60.0000 mg/m2 | Freq: Once | INTRAVENOUS | Status: DC

## 2024-10-04 MED ORDER — HYDROMORPHONE HCL 1 MG/ML IJ SOLN
2.0000 mg | Freq: Once | INTRAMUSCULAR | Status: AC
  Administered 2024-10-04: 2 mg via INTRAVENOUS
  Filled 2024-10-04: qty 2

## 2024-10-04 MED ORDER — DEXAMETHASONE SOD PHOSPHATE PF 10 MG/ML IJ SOLN: 10.0000 mg | Freq: Once | INTRAMUSCULAR | Status: DC

## 2024-10-04 MED ORDER — SODIUM CHLORIDE 0.9 % IV SOLN
10.0000 mg/kg | Freq: Once | INTRAVENOUS | Status: AC
  Administered 2024-10-04: 500 mg via INTRAVENOUS
  Filled 2024-10-04: qty 50

## 2024-10-04 MED ORDER — DIPHENHYDRAMINE HCL 50 MG/ML IJ SOLN: 50.0000 mg | Freq: Once | INTRAMUSCULAR | Status: DC

## 2024-10-04 MED ORDER — ACETAMINOPHEN 325 MG PO TABS: 650.0000 mg | ORAL_TABLET | Freq: Once | ORAL | Status: DC

## 2024-10-04 MED ORDER — DEXAMETHASONE SOD PHOSPHATE PF 10 MG/ML IJ SOLN
10.0000 mg | Freq: Once | INTRAMUSCULAR | Status: AC
  Administered 2024-10-04: 10 mg via INTRAVENOUS
  Filled 2024-10-04: qty 1

## 2024-10-04 MED ORDER — OXYCODONE HCL 5 MG PO TABS: 10.0000 mg | ORAL_TABLET | ORAL | Status: DC | PRN

## 2024-10-04 MED ORDER — MORPHINE SULFATE ER 15 MG PO TBCR: 15.0000 mg | EXTENDED_RELEASE_TABLET | Freq: Two times a day (BID) | ORAL | 0 refills | Status: AC

## 2024-10-04 MED ORDER — SODIUM CHLORIDE 0.9 % IV SOLN: INTRAVENOUS | Status: DC

## 2024-10-04 NOTE — Progress Notes (Signed)
 " Hematology and Oncology Follow Up Visit  Regina Young [id number] [birth year] 69 y.o. 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  She received 5000 rad to the lung and 3000 rad to the spine. Xgeva  120 mg subcu every 3 months-next dose 12/2024  Carbo/Alimta /Keytruda  -- s/p cycle #6 - start on 03/21/2024 - d/c on 08/17/2024 Taxotere / Cyramza  - s/p cycle #2- start  on 08/23/2024 Eliquis  5 mg p.o. twice daily --start 02/01/2024     Interim History:  Regina Young is in for follow-up.  Unfortunately, the big issue is pain.  She is still having quite a lot of pain.  This is because of the metastasis to the sacral area that is affecting her sacral plexus.  She is having a lot of pain in the back and in the right leg.  She has had prior radiotherapy.  However, she we will see about getting more radiotherapy into her.  We will make adjustments to her medications.  I will increase her MS Contin  up to 45 mg p.o. twice daily.  I told her to take the oxycodone  every 4 hours as needed.  I had put her on some Decadron .  She we will continue the Decadron .  She takes 8 mg p.o. twice daily.  She is also on gabapentin .  I just feel bad that she is miserable because this pain.  I am just not sure how else we can manage this.  Again, she is on second line therapy.  This will be her third cycle.  After this cycle, we will see about another PET scan to see how everything looks.  Otherwise, she is managing.  She is eating.  She is having no nausea or vomiting.  She is having no problems with bowels or bladder.  There is no bleeding.  She is on Eliquis .  Overall, I will say that her performance status is probably ECOG 2.       Medications:  Current Outpatient Medications:    Calcium  Citrate 250 MG TABS, Take 250 mg by mouth in the morning., Disp: , Rfl:    Cholecalciferol  (VITAMIN D3) 50 MCG (2000 UT) TABS, Take 2,000 Units by mouth daily with breakfast., Disp: , Rfl:    cyanocobalamin  (VITAMIN B12) 1000 MCG tablet, Take 1,000 mcg by mouth daily., Disp: , Rfl:    dexamethasone  (DECADRON ) 4 MG tablet, Take 2 tabs by mouth 2 times daily starting day before chemo. Then take 2 tabs daily for 2 days starting day after chemo. Take with food. (Patient taking differently: 4 mg 2 (two) times daily. Take 2 tabs by mouth 2 times daily starting day before chemo. Then take 2 tabs daily for 2 days starting day after chemo. Take with food.), Disp: 30 tablet, Rfl: 0   ELIQUIS  5 MG TABS tablet, TAKE 1 TABLET(5 MG) BY MOUTH TWICE DAILY, Disp: 60 tablet, Rfl: 3   fluticasone  (FLONASE ) 50 MCG/ACT nasal spray, Place 2 sprays into both nostrils daily. (Patient taking differently: Place into both nostrils daily as needed.), Disp: 16 g, Rfl: 0   folic acid  (FOLVITE ) 1 MG tablet, Take 1 mg by mouth daily., Disp: , Rfl:    gabapentin  (NEURONTIN ) 300 MG capsule, Take 1 capsule (300 mg total) by mouth 4 (four)  times daily., Disp: 120 capsule, Rfl: 2   ipratropium-albuterol  (DUONEB) 0.5-2.5 (3) MG/3ML SOLN, Take 3 mLs by nebulization 3 (three) times daily for 7 days, THEN 3 mLs every 6 (six) hours as needed., Disp: 360 mL, Rfl: 1   lidocaine -prilocaine  (EMLA ) cream, Apply to affected area once, Disp: 30 g, Rfl: 3   morphine  (MS CONTIN ) 15 MG 12 hr tablet, Take 1 tablet (15 mg total) by mouth every 12 (twelve) hours., Disp: 60 tablet, Rfl: 0   morphine  (MS CONTIN ) 30 MG 12 hr tablet, Take 1 tablet (30 mg total) by mouth every 12 (twelve) hours., Disp: 60 tablet, Rfl: 0   omeprazole  (PRILOSEC ) 20 MG capsule, Take 2 capsules (40 mg total) by mouth daily before breakfast., Disp: 60 capsule, Rfl: 1   ondansetron  (ZOFRAN ) 4 MG tablet, Take 1 tablet (4 mg  total) by mouth every 6 (six) hours as needed for nausea., Disp: 20 tablet, Rfl: 0   ondansetron  (ZOFRAN ) 8 MG tablet, Take 1 tablet (8 mg total) by mouth every 8 (eight) hours as needed for nausea or vomiting., Disp: 30 tablet, Rfl: 1   prochlorperazine  (COMPAZINE ) 10 MG tablet, Take 1 tablet (10 mg total) by mouth every 6 (six) hours as needed for nausea or vomiting., Disp: 30 tablet, Rfl: 1   rosuvastatin  (CRESTOR ) 5 MG tablet, Take 5 mg by mouth in the morning., Disp: , Rfl:    lidocaine -prilocaine  (EMLA ) cream, Apply 1 Application topically as needed., Disp: 30 g, Rfl: 3   oxyCODONE  (OXY IR/ROXICODONE ) 5 MG immediate release tablet, Take 2 tablets (10 mg total) by mouth every 4 (four) hours as needed for severe pain (pain score 7-10). Take one to two tablets every six hours as needed for pain, Disp: , Rfl:   Current Facility-Administered Medications:    0.9 %  sodium chloride  infusion, , Intravenous, Continuous, Cortez Flippen, Maude SAUNDERS, MD, Stopped at 10/04/24 1318  Allergies: No Known Allergies  Past Medical History, Surgical history, Social history, and Family History were reviewed and updated.  Review of Systems: Review of Systems  Constitutional: Negative.   HENT:  Negative.    Eyes: Negative.   Respiratory: Negative.    Cardiovascular: Negative.   Gastrointestinal:  Positive for abdominal pain.  Endocrine: Negative.   Genitourinary: Negative.    Musculoskeletal:  Positive for back pain.  Neurological: Negative.   Hematological: Negative.   Psychiatric/Behavioral: Negative.      Physical Exam:   Vital signs show temperature of 98.4.  Pulse is 69.  Blood pressure 136/83.  Weight is 102 pounds.    Wt Readings from Last 3 Encounters:  10/04/24 102 lb 1.9 oz (46.3 kg)  09/15/24 105 lb (47.6 kg)  09/13/24 103 lb (46.7 kg)    Physical Exam Vitals reviewed.  HENT:     Head: Normocephalic and atraumatic.  Eyes:     Pupils: Pupils are equal, round, and reactive to light.   Cardiovascular:     Rate and Rhythm: Normal rate and regular rhythm.     Heart sounds: Normal heart sounds.  Pulmonary:     Effort: Pulmonary effort is normal.     Breath sounds: Normal breath sounds.  Abdominal:     General: Bowel sounds are normal.     Palpations: Abdomen is soft.  Musculoskeletal:        General: No tenderness or deformity. Normal range of motion.     Cervical back: Normal range of motion.  Lymphadenopathy:     Cervical: No cervical  adenopathy.  Skin:    General: Skin is warm and dry.     Findings: No erythema or rash.  Neurological:     Mental Status: She is alert and oriented to person, place, and time.  Psychiatric:        Behavior: Behavior normal.        Thought Content: Thought content normal.        Judgment: Judgment normal.      Lab Results  Component Value Date   WBC 7.2 10/04/2024   HGB 10.3 (L) 10/04/2024   HCT 32.8 (L) 10/04/2024   MCV 98.2 10/04/2024   PLT 317 10/04/2024     Chemistry      Component Value Date/Time   NA 136 10/04/2024 0835   K 4.4 10/04/2024 0835   CL 101 10/04/2024 0835   CO2 27 10/04/2024 0835   BUN 28 (H) 10/04/2024 0835   CREATININE 0.68 10/04/2024 0835      Component Value Date/Time   CALCIUM  8.4 (L) 10/04/2024 0835   ALKPHOS 68 10/04/2024 0835   AST 23 10/04/2024 0835   ALT 42 10/04/2024 0835   BILITOT 0.3 10/04/2024 0835      Impression and Plan: Regina Young is a very charming 69 year old white female.  She has a past history of an early stage colon cancer.  However, now it looks like she has  metastatic non-small cell lung cancer-adenocarcinoma.  She completed the radiotherapy for the sacral metastasis and the lung primary.  Hopefully, the Taxotere /Cyramza  will help.  We will give her cycle #3 today.  After this cycle, we will see about another PET scan.  I wrote down for she and her husband what I thought would be the right or best pain regimen for her.  Hopefully, this will help make a difference  so that she will not be miserable.  We will see about moving her next treatment out by 1 week.  This way, we can get the PET scan.  Hopefully, radiotherapy will help some of the pain.  I just am not sure what else can be done.  This is something that cannot be done surgically.  I am unsure if there is any kind of block that can be done to try to help with her pain.  We will have to see how she does with the changes in her regimen right now.  Maude JONELLE Crease, MD 1/28/20265:16 PM "

## 2024-10-04 NOTE — Progress Notes (Signed)
 Patient continues to have significant pain issues. Dr Timmy will make adjustments to her pain management, and will see about additional radiation.  Patient will proceed with cycle three. She will need a PET after this cycle. Scheduled for 10/26/2024.  Oncology Nurse Navigator Documentation     10/04/2024    8:45 AM  Oncology Nurse Navigator Flowsheets  Navigator Follow Up Date: 10/26/2024  Navigator Follow Up Reason: Scan Review  Navigator Location [facility]  Navigator Encounter Type Appt/Treatment Plan Review  Patient Visit Type MedOnc  Treatment Phase Active Tx  Barriers/Navigation Needs No Barriers At This Time  Interventions None Required  Acuity Level 1-No Barriers  Support Groups/Services Friends and Family  Time Spent with Patient 15

## 2024-10-04 NOTE — Patient Instructions (Signed)
 [facility] - A DEPT OF [facility]  Discharge Instructions: Thank you for choosing [facility] to provide your oncology and hematology care.   If you have a lab appointment with the Cancer Center, please go directly to the Cancer Center and check in at the registration area.  Wear comfortable clothing and clothing appropriate for easy access to any Portacath or PICC line.   We strive to give you quality time with your provider. You may need to reschedule your appointment if you arrive late (15 or more minutes).  Arriving late affects you and other patients whose appointments are after yours.  Also, if you miss three or more appointments without notifying the office, you may be dismissed from the clinic at the providers discretion.      For prescription refill requests, have your pharmacy contact our office and allow 72 hours for refills to be completed.    Today you received the following chemotherapy and/or immunotherapy agents cyramza , taxotere       To help prevent nausea and vomiting after your treatment, we encourage you to take your nausea medication as directed.  BELOW ARE SYMPTOMS THAT SHOULD BE REPORTED IMMEDIATELY: *FEVER GREATER THAN 100.4 F (38 C) OR HIGHER *CHILLS OR SWEATING *NAUSEA AND VOMITING THAT IS NOT CONTROLLED WITH YOUR NAUSEA MEDICATION *UNUSUAL SHORTNESS OF BREATH *UNUSUAL BRUISING OR BLEEDING *URINARY PROBLEMS (pain or burning when urinating, or frequent urination) *BOWEL PROBLEMS (unusual diarrhea, constipation, pain near the anus) TENDERNESS IN MOUTH AND THROAT WITH OR WITHOUT PRESENCE OF ULCERS (sore throat, sores in mouth, or a toothache) UNUSUAL RASH, SWELLING OR PAIN  UNUSUAL VAGINAL DISCHARGE OR ITCHING   Items with * indicate a potential emergency and should be followed up as soon as possible or go to the Emergency Department if any problems should occur.  Please show the CHEMOTHERAPY ALERT CARD or  IMMUNOTHERAPY ALERT CARD at check-in to the Emergency Department and triage nurse. Should you have questions after your visit or need to cancel or reschedule your appointment, please contact [facility] - A DEPT OF [facility]  [phone] and follow the prompts.  Office hours are 8:00 a.m. to 4:30 p.m. Monday - Friday. Please note that voicemails left after 4:00 p.m. may not be returned until the following business day.  We are closed weekends and major holidays. You have access to a nurse at all times for urgent questions. Please call the main number to the clinic [phone] and follow the prompts.  For any non-urgent questions, you may also contact your provider using MyChart. We now offer e-Visits for anyone 63 and older to request care online for non-urgent symptoms. For details visit [email or web address].   Also download the MyChart app! Go to the app store, search MyChart, open the app, select [facility], and log in with your MyChart username and password.

## 2024-10-04 NOTE — Patient Instructions (Signed)

## 2024-10-05 ENCOUNTER — Telehealth: Payer: Self-pay

## 2024-10-05 LAB — T4: T4, Total: 7.3 ug/dL (ref 4.5–12.0)

## 2024-10-05 NOTE — Telephone Encounter (Signed)
 Received phone call from patient clarifying when to start claritin prior to her gcsf injection.  This RN educated pt and husband that she will take one tablet of claritin daily starting the day of the shot and for 2 days post injection. Pt verbalized understanding and had no further questions. Appointment date and time reviewed.

## 2024-10-06 ENCOUNTER — Inpatient Hospital Stay

## 2024-10-06 ENCOUNTER — Other Ambulatory Visit: Payer: Self-pay

## 2024-10-06 ENCOUNTER — Encounter: Payer: Self-pay | Admitting: Hematology & Oncology

## 2024-10-06 VITALS — BP 163/78 | HR 83 | Temp 98.2°F | Resp 20 | Wt 105.0 lb

## 2024-10-06 DIAGNOSIS — C786 Secondary malignant neoplasm of retroperitoneum and peritoneum: Secondary | ICD-10-CM

## 2024-10-06 DIAGNOSIS — C187 Malignant neoplasm of sigmoid colon: Secondary | ICD-10-CM

## 2024-10-06 DIAGNOSIS — C3490 Malignant neoplasm of unspecified part of unspecified bronchus or lung: Secondary | ICD-10-CM

## 2024-10-06 DIAGNOSIS — Z5112 Encounter for antineoplastic immunotherapy: Secondary | ICD-10-CM | POA: Diagnosis not present

## 2024-10-06 DIAGNOSIS — C3491 Malignant neoplasm of unspecified part of right bronchus or lung: Secondary | ICD-10-CM

## 2024-10-06 MED ORDER — PEGFILGRASTIM-JMDB 6 MG/0.6ML ~~LOC~~ SOSY
6.0000 mg | PREFILLED_SYRINGE | Freq: Once | SUBCUTANEOUS | Status: AC
  Administered 2024-10-06: 6 mg via SUBCUTANEOUS
  Filled 2024-10-06: qty 0.6

## 2024-10-06 MED ORDER — OXYCODONE HCL 5 MG PO TABS: 10.0000 mg | ORAL_TABLET | ORAL | 0 refills | Status: AC | PRN

## 2024-10-06 NOTE — Patient Instructions (Signed)

## 2024-10-09 ENCOUNTER — Ambulatory Visit: Admitting: Radiation Oncology

## 2024-10-10 ENCOUNTER — Ambulatory Visit
Admission: RE | Admit: 2024-10-10 | Discharge: 2024-10-10 | Disposition: A | Source: Ambulatory Visit | Attending: Radiation Oncology | Admitting: Radiation Oncology

## 2024-10-10 ENCOUNTER — Ambulatory Visit
Admission: RE | Admit: 2024-10-10 | Discharge: 2024-10-10 | Attending: Radiation Oncology | Admitting: Radiation Oncology

## 2024-10-10 DIAGNOSIS — C7951 Secondary malignant neoplasm of bone: Secondary | ICD-10-CM

## 2024-10-10 DIAGNOSIS — C3491 Malignant neoplasm of unspecified part of right bronchus or lung: Secondary | ICD-10-CM

## 2024-10-10 MED ORDER — NYSTATIN 100000 UNIT/ML MT SUSP: 5.0000 mL | Freq: Four times a day (QID) | OROMUCOSAL | 0 refills | Status: AC

## 2024-10-12 ENCOUNTER — Inpatient Hospital Stay: Attending: Hematology & Oncology | Admitting: Dietician

## 2024-10-12 NOTE — Progress Notes (Signed)
 Nutrition Assessment Reached out to patient at preferred mobile#.  Reason for Assessment: MST screen for weight loss.    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  Patient reports after last chemo treatment felt like razor blades on her tongue MMW started and helping.  Take Vit D, B!@, Folate, and calcium . Decadron  boost appetite but she craves sweets. Uses Premier Protein, didn't care for Boost products in past. She doesn't have a blended to make shakes out of [place]reme[place]. Doesn't eat 3 meals always usually grabs and eats on the run while working. No food allergies. [place], eggs, toast Lunch: sandwich or soup  When works she grabs cookie, cottage cheese pears Supper: meat and vegetables nothing fancy (enjoys pork, chicken, beef, and fish) Spouse cooks most meals.   Anthropometrics:   Height: 62 Weight: 105# UBW: 105-110 BMI: 19.2  NUTRITION DIAGNOSIS: Inadequate PO intake to meet increased nutrient needs, r/t cancer diagnosis recent mucositis.    INTERVENTION:   Relayed that nutrition services are wrap around service provided at no charge and encouraged continued communication if experiencing continued weight loss or any nutritional impact symptoms (NIS).  Educated on importance of adequate calorie and protein energy intake  with nutrient dense foods when possible to maintain weight/strength  Encouraged small frequent feeds and trying to snack more.  Suggested oral nutrition supplement with 350 calories or more. Will request samples be available when she comes for radiation.  Discussed strategies for Anemia.  Emailed Nutrition Tip sheet  for Anemia and   High Protein High Calorie Snacking with Contact information provided   MONITORING, EVALUATION, GOAL: weight, PO  intake, Nutrition Impact Symptoms, labs Goal is weight maintenance  Next Visit: PRN at patient or provider request  Micheline Craven, RDN, LDN Registered Dietitian, [facility] Part Time Remote (Usual office hours: Tuesday-Thursday) Cell: [phone]

## 2024-10-13 ENCOUNTER — Other Ambulatory Visit: Payer: Self-pay

## 2024-10-17 ENCOUNTER — Ambulatory Visit: Admitting: Radiation Oncology

## 2024-10-17 ENCOUNTER — Ambulatory Visit

## 2024-10-18 ENCOUNTER — Ambulatory Visit

## 2024-10-19 ENCOUNTER — Ambulatory Visit

## 2024-10-20 ENCOUNTER — Ambulatory Visit

## 2024-10-23 ENCOUNTER — Ambulatory Visit

## 2024-10-24 ENCOUNTER — Ambulatory Visit

## 2024-10-25 ENCOUNTER — Ambulatory Visit

## 2024-10-25 ENCOUNTER — Inpatient Hospital Stay

## 2024-10-25 ENCOUNTER — Inpatient Hospital Stay: Admitting: Hematology & Oncology

## 2024-10-26 ENCOUNTER — Ambulatory Visit

## 2024-10-26 ENCOUNTER — Other Ambulatory Visit (HOSPITAL_COMMUNITY)

## 2024-10-27 ENCOUNTER — Inpatient Hospital Stay

## 2024-10-27 ENCOUNTER — Ambulatory Visit

## 2024-10-30 ENCOUNTER — Ambulatory Visit

## 2024-11-01 ENCOUNTER — Inpatient Hospital Stay

## 2024-11-01 ENCOUNTER — Inpatient Hospital Stay: Admitting: Hematology & Oncology

## 2024-11-03 ENCOUNTER — Inpatient Hospital Stay

## 2024-11-15 ENCOUNTER — Inpatient Hospital Stay: Admitting: Hematology & Oncology

## 2024-11-15 ENCOUNTER — Inpatient Hospital Stay

## 2024-11-15 ENCOUNTER — Inpatient Hospital Stay: Attending: Hematology & Oncology

## 2024-11-17 ENCOUNTER — Inpatient Hospital Stay

## 2024-12-06 ENCOUNTER — Inpatient Hospital Stay: Admitting: Hematology & Oncology

## 2024-12-06 ENCOUNTER — Inpatient Hospital Stay

## 2024-12-08 ENCOUNTER — Inpatient Hospital Stay
